# Patient Record
Sex: Male | Born: 1950
Health system: Southern US, Community
[De-identification: ages and names within clinical notes are randomized; demographics above are authoritative.]

## PROBLEM LIST (undated history)

## (undated) DIAGNOSIS — E785 Hyperlipidemia, unspecified: Secondary | ICD-10-CM

## (undated) DIAGNOSIS — F32A Depression, unspecified: Secondary | ICD-10-CM

## (undated) DIAGNOSIS — I2510 Atherosclerotic heart disease of native coronary artery without angina pectoris: Secondary | ICD-10-CM

## (undated) DIAGNOSIS — M542 Cervicalgia: Secondary | ICD-10-CM

## (undated) DIAGNOSIS — I10 Essential (primary) hypertension: Secondary | ICD-10-CM

## (undated) DIAGNOSIS — Z8711 Personal history of peptic ulcer disease: Secondary | ICD-10-CM

## (undated) DIAGNOSIS — M48061 Spinal stenosis, lumbar region without neurogenic claudication: Secondary | ICD-10-CM

## (undated) DIAGNOSIS — K219 Gastro-esophageal reflux disease without esophagitis: Secondary | ICD-10-CM

## (undated) DIAGNOSIS — M1990 Unspecified osteoarthritis, unspecified site: Secondary | ICD-10-CM

## (undated) DIAGNOSIS — E119 Type 2 diabetes mellitus without complications: Secondary | ICD-10-CM

## (undated) DIAGNOSIS — F329 Major depressive disorder, single episode, unspecified: Secondary | ICD-10-CM

## (undated) DIAGNOSIS — Z973 Presence of spectacles and contact lenses: Secondary | ICD-10-CM

## (undated) DIAGNOSIS — F17200 Nicotine dependence, unspecified, uncomplicated: Secondary | ICD-10-CM

## (undated) DIAGNOSIS — M6250 Muscle wasting and atrophy, not elsewhere classified, unspecified site: Secondary | ICD-10-CM

## (undated) HISTORY — PX: REPLACEMENT TOTAL KNEE: SUR1224

## (undated) HISTORY — PX: MR MRA DUPLICATE EXAM  INACTIVATE: HXRAD283

## (undated) HISTORY — PX: SPINE SURGERY: SHX786

## (undated) HISTORY — DX: Atherosclerotic heart disease of native coronary artery without angina pectoris: I25.10

## (undated) HISTORY — PX: JOINT REPLACEMENT: SHX530

## (undated) HISTORY — DX: Depression, unspecified: F32.A

## (undated) HISTORY — DX: Major depressive disorder, single episode, unspecified: F32.9

## (undated) HISTORY — DX: Nicotine dependence, unspecified, uncomplicated: F17.200

## (undated) HISTORY — DX: Cervicalgia: M54.2

## (undated) HISTORY — DX: Essential (primary) hypertension: I10

## (undated) HISTORY — PX: MULTIPLE TOOTH EXTRACTIONS: SHX2053

## (undated) HISTORY — DX: Hyperlipidemia, unspecified: E78.5

## (undated) HISTORY — DX: Type 2 diabetes mellitus without complications: E11.9

## (undated) HISTORY — DX: Personal history of peptic ulcer disease: Z87.11

## (undated) HISTORY — DX: Muscle wasting and atrophy, not elsewhere classified, unspecified site: M62.50

---

## 1982-01-30 HISTORY — PX: STOMACH SURGERY: SHX791

## 1987-01-31 HISTORY — PX: HEMORRHOID SURGERY: SHX153

## 1988-01-31 DIAGNOSIS — K219 Gastro-esophageal reflux disease without esophagitis: Secondary | ICD-10-CM

## 1988-01-31 HISTORY — DX: Gastro-esophageal reflux disease without esophagitis: K21.9

## 1988-01-31 HISTORY — PX: OTHER SURGICAL HISTORY: SHX169

## 1990-01-30 DIAGNOSIS — E785 Hyperlipidemia, unspecified: Secondary | ICD-10-CM

## 1990-01-30 HISTORY — DX: Hyperlipidemia, unspecified: E78.5

## 1995-01-31 HISTORY — PX: CERVICAL DISCECTOMY: SHX98

## 1996-09-30 DIAGNOSIS — E119 Type 2 diabetes mellitus without complications: Secondary | ICD-10-CM

## 1996-09-30 HISTORY — DX: Type 2 diabetes mellitus without complications: E11.9

## 1998-01-30 HISTORY — PX: CERVICAL DISCECTOMY: SHX98

## 1998-08-05 ENCOUNTER — Encounter: Admission: RE | Admit: 1998-08-05 | Discharge: 1998-09-08 | Payer: Self-pay | Admitting: Family Medicine

## 1998-10-14 ENCOUNTER — Ambulatory Visit (HOSPITAL_COMMUNITY): Admission: RE | Admit: 1998-10-14 | Discharge: 1998-10-15 | Payer: Self-pay | Admitting: Neurosurgery

## 1998-10-14 ENCOUNTER — Encounter: Payer: Self-pay | Admitting: Neurosurgery

## 1999-01-14 ENCOUNTER — Encounter: Payer: Self-pay | Admitting: Neurosurgery

## 1999-01-14 ENCOUNTER — Ambulatory Visit (HOSPITAL_COMMUNITY): Admission: RE | Admit: 1999-01-14 | Discharge: 1999-01-14 | Payer: Self-pay | Admitting: Neurosurgery

## 1999-07-18 ENCOUNTER — Encounter: Payer: Self-pay | Admitting: Neurosurgery

## 1999-07-18 ENCOUNTER — Encounter: Admission: RE | Admit: 1999-07-18 | Discharge: 1999-07-18 | Payer: Self-pay | Admitting: Neurosurgery

## 2000-04-30 ENCOUNTER — Encounter: Payer: Self-pay | Admitting: Family Medicine

## 2001-04-25 HISTORY — PX: OTHER SURGICAL HISTORY: SHX169

## 2001-06-25 ENCOUNTER — Ambulatory Visit (HOSPITAL_BASED_OUTPATIENT_CLINIC_OR_DEPARTMENT_OTHER): Admission: RE | Admit: 2001-06-25 | Discharge: 2001-06-25 | Payer: Self-pay | Admitting: *Deleted

## 2001-06-25 ENCOUNTER — Encounter (INDEPENDENT_AMBULATORY_CARE_PROVIDER_SITE_OTHER): Payer: Self-pay | Admitting: Specialist

## 2001-08-30 ENCOUNTER — Encounter: Payer: Self-pay | Admitting: Family Medicine

## 2002-02-14 ENCOUNTER — Ambulatory Visit (HOSPITAL_COMMUNITY): Admission: RE | Admit: 2002-02-14 | Discharge: 2002-02-14 | Payer: Self-pay | Admitting: Orthopaedic Surgery

## 2002-04-10 ENCOUNTER — Encounter: Admission: RE | Admit: 2002-04-10 | Discharge: 2002-04-10 | Payer: Self-pay | Admitting: Orthopaedic Surgery

## 2002-04-19 HISTORY — PX: CERVICAL DISC SURGERY: SHX588

## 2002-05-07 ENCOUNTER — Inpatient Hospital Stay (HOSPITAL_COMMUNITY): Admission: RE | Admit: 2002-05-07 | Discharge: 2002-05-08 | Payer: Self-pay | Admitting: Orthopaedic Surgery

## 2003-03-03 ENCOUNTER — Encounter: Payer: Self-pay | Admitting: Family Medicine

## 2003-03-03 LAB — CONVERTED CEMR LAB: PSA: 1.1 ng/mL

## 2003-03-20 ENCOUNTER — Ambulatory Visit (HOSPITAL_COMMUNITY): Admission: RE | Admit: 2003-03-20 | Discharge: 2003-03-21 | Payer: Self-pay | Admitting: Orthopaedic Surgery

## 2003-05-31 DIAGNOSIS — I10 Essential (primary) hypertension: Secondary | ICD-10-CM

## 2003-05-31 HISTORY — DX: Essential (primary) hypertension: I10

## 2003-07-31 ENCOUNTER — Encounter: Payer: Self-pay | Admitting: Family Medicine

## 2003-07-31 LAB — CONVERTED CEMR LAB: Hgb A1c MFr Bld: 7 %

## 2003-12-31 ENCOUNTER — Encounter: Payer: Self-pay | Admitting: Family Medicine

## 2003-12-31 LAB — CONVERTED CEMR LAB: Hgb A1c MFr Bld: 8.4 %

## 2004-01-26 ENCOUNTER — Ambulatory Visit: Payer: Self-pay | Admitting: Family Medicine

## 2004-01-28 ENCOUNTER — Ambulatory Visit: Payer: Self-pay | Admitting: Family Medicine

## 2004-02-29 ENCOUNTER — Ambulatory Visit: Payer: Self-pay | Admitting: Family Medicine

## 2004-04-30 ENCOUNTER — Encounter: Payer: Self-pay | Admitting: Family Medicine

## 2004-05-26 ENCOUNTER — Ambulatory Visit: Payer: Self-pay | Admitting: Family Medicine

## 2004-05-30 ENCOUNTER — Ambulatory Visit: Payer: Self-pay | Admitting: Family Medicine

## 2004-09-26 ENCOUNTER — Ambulatory Visit: Payer: Self-pay | Admitting: Family Medicine

## 2004-09-30 ENCOUNTER — Encounter: Payer: Self-pay | Admitting: Family Medicine

## 2004-10-07 ENCOUNTER — Ambulatory Visit: Payer: Self-pay | Admitting: Family Medicine

## 2004-12-08 ENCOUNTER — Ambulatory Visit: Payer: Self-pay | Admitting: Family Medicine

## 2005-01-26 ENCOUNTER — Ambulatory Visit: Payer: Self-pay | Admitting: Family Medicine

## 2005-01-30 ENCOUNTER — Encounter: Payer: Self-pay | Admitting: Family Medicine

## 2005-01-30 LAB — CONVERTED CEMR LAB
Hgb A1c MFr Bld: 6 %
Microalbumin U total vol: 17.2 mg/L

## 2005-01-31 ENCOUNTER — Ambulatory Visit: Payer: Self-pay | Admitting: Family Medicine

## 2005-02-13 ENCOUNTER — Ambulatory Visit: Payer: Self-pay | Admitting: Family Medicine

## 2005-03-13 ENCOUNTER — Ambulatory Visit: Payer: Self-pay | Admitting: Family Medicine

## 2005-04-25 ENCOUNTER — Ambulatory Visit: Payer: Self-pay | Admitting: Family Medicine

## 2005-05-01 ENCOUNTER — Ambulatory Visit: Payer: Self-pay | Admitting: Family Medicine

## 2005-09-30 ENCOUNTER — Encounter: Payer: Self-pay | Admitting: Family Medicine

## 2005-10-23 ENCOUNTER — Ambulatory Visit: Payer: Self-pay | Admitting: Family Medicine

## 2005-11-27 ENCOUNTER — Ambulatory Visit: Payer: Self-pay | Admitting: Family Medicine

## 2006-03-19 ENCOUNTER — Ambulatory Visit: Payer: Self-pay | Admitting: Family Medicine

## 2006-03-19 LAB — CONVERTED CEMR LAB
Albumin: 4 g/dL (ref 3.5–5.2)
Chloride: 106 meq/L (ref 96–112)
Cholesterol: 142 mg/dL (ref 0–200)
Creatinine,U: 198.9 mg/dL
GFR calc non Af Amer: 83 mL/min
Hgb A1c MFr Bld: 6.9 % — ABNORMAL HIGH (ref 4.6–6.0)
Microalb Creat Ratio: 18.1 mg/g (ref 0.0–30.0)
Microalb, Ur: 3.6 mg/dL — ABNORMAL HIGH (ref 0.0–1.9)
PSA: 2.15 ng/mL (ref 0.10–4.00)
Potassium: 3.8 meq/L (ref 3.5–5.1)
Sodium: 144 meq/L (ref 135–145)
Total CHOL/HDL Ratio: 4.6
VLDL: 42 mg/dL — ABNORMAL HIGH (ref 0–40)

## 2006-03-20 ENCOUNTER — Encounter: Payer: Self-pay | Admitting: Family Medicine

## 2006-03-26 ENCOUNTER — Ambulatory Visit: Payer: Self-pay | Admitting: Family Medicine

## 2006-04-24 ENCOUNTER — Ambulatory Visit: Payer: Self-pay | Admitting: Family Medicine

## 2006-06-22 ENCOUNTER — Encounter: Payer: Self-pay | Admitting: Family Medicine

## 2006-06-22 ENCOUNTER — Ambulatory Visit: Payer: Self-pay | Admitting: Family Medicine

## 2006-06-22 DIAGNOSIS — M7530 Calcific tendinitis of unspecified shoulder: Secondary | ICD-10-CM | POA: Insufficient documentation

## 2006-06-22 DIAGNOSIS — M5090 Cervical disc disorder, unspecified, unspecified cervical region: Secondary | ICD-10-CM | POA: Insufficient documentation

## 2006-06-22 DIAGNOSIS — R972 Elevated prostate specific antigen [PSA]: Secondary | ICD-10-CM | POA: Insufficient documentation

## 2006-06-22 LAB — CONVERTED CEMR LAB: Hgb A1c MFr Bld: 7.3 % — ABNORMAL HIGH (ref 4.6–6.0)

## 2006-06-25 DIAGNOSIS — I10 Essential (primary) hypertension: Secondary | ICD-10-CM | POA: Insufficient documentation

## 2006-06-25 DIAGNOSIS — E1165 Type 2 diabetes mellitus with hyperglycemia: Secondary | ICD-10-CM

## 2006-06-25 DIAGNOSIS — E785 Hyperlipidemia, unspecified: Secondary | ICD-10-CM

## 2006-06-26 ENCOUNTER — Encounter: Payer: Self-pay | Admitting: Family Medicine

## 2006-07-11 ENCOUNTER — Encounter (INDEPENDENT_AMBULATORY_CARE_PROVIDER_SITE_OTHER): Payer: Self-pay | Admitting: *Deleted

## 2006-07-13 ENCOUNTER — Ambulatory Visit: Payer: Self-pay | Admitting: Family Medicine

## 2006-07-13 DIAGNOSIS — G5600 Carpal tunnel syndrome, unspecified upper limb: Secondary | ICD-10-CM | POA: Insufficient documentation

## 2006-07-13 DIAGNOSIS — R519 Headache, unspecified: Secondary | ICD-10-CM | POA: Insufficient documentation

## 2006-07-13 DIAGNOSIS — R51 Headache: Secondary | ICD-10-CM

## 2006-07-31 ENCOUNTER — Encounter (INDEPENDENT_AMBULATORY_CARE_PROVIDER_SITE_OTHER): Payer: Self-pay | Admitting: *Deleted

## 2007-02-06 ENCOUNTER — Ambulatory Visit: Payer: Self-pay | Admitting: Family Medicine

## 2007-02-08 ENCOUNTER — Encounter: Payer: Self-pay | Admitting: Family Medicine

## 2007-02-27 ENCOUNTER — Encounter: Payer: Self-pay | Admitting: Family Medicine

## 2007-06-14 ENCOUNTER — Ambulatory Visit: Payer: Self-pay | Admitting: Family Medicine

## 2007-06-14 LAB — CONVERTED CEMR LAB
Albumin: 3.8 g/dL (ref 3.5–5.2)
Alkaline Phosphatase: 62 units/L (ref 39–117)
BUN: 15 mg/dL (ref 6–23)
Calcium: 9.1 mg/dL (ref 8.4–10.5)
Cholesterol: 113 mg/dL (ref 0–200)
Creatinine,U: 254.9 mg/dL
Eosinophils Absolute: 0.3 10*3/uL (ref 0.0–0.7)
Eosinophils Relative: 3.2 % (ref 0.0–5.0)
GFR calc Af Amer: 81 mL/min
GFR calc non Af Amer: 67 mL/min
HCT: 44.5 % (ref 39.0–52.0)
Hemoglobin: 14.8 g/dL (ref 13.0–17.0)
Hgb A1c MFr Bld: 6.7 % — ABNORMAL HIGH (ref 4.6–6.0)
MCV: 95.2 fL (ref 78.0–100.0)
Microalb Creat Ratio: 6.3 mg/g (ref 0.0–30.0)
Microalb, Ur: 1.6 mg/dL (ref 0.0–1.9)
Monocytes Absolute: 1 10*3/uL (ref 0.1–1.0)
Neutro Abs: 6 10*3/uL (ref 1.4–7.7)
Platelets: 256 10*3/uL (ref 150–400)
Potassium: 4.3 meq/L (ref 3.5–5.1)
RDW: 12.5 % (ref 11.5–14.6)
TSH: 2.16 microintl units/mL (ref 0.35–5.50)
Total Protein: 6.5 g/dL (ref 6.0–8.3)
Triglycerides: 238 mg/dL (ref 0–149)
WBC: 9.3 10*3/uL (ref 4.5–10.5)

## 2007-06-18 ENCOUNTER — Ambulatory Visit: Payer: Self-pay | Admitting: Family Medicine

## 2007-07-01 ENCOUNTER — Ambulatory Visit: Payer: Self-pay | Admitting: Family Medicine

## 2007-07-01 ENCOUNTER — Encounter (INDEPENDENT_AMBULATORY_CARE_PROVIDER_SITE_OTHER): Payer: Self-pay | Admitting: *Deleted

## 2007-07-01 LAB — CONVERTED CEMR LAB: OCCULT 3: NEGATIVE

## 2007-08-27 ENCOUNTER — Ambulatory Visit: Payer: Self-pay | Admitting: Family Medicine

## 2008-01-21 ENCOUNTER — Encounter (INDEPENDENT_AMBULATORY_CARE_PROVIDER_SITE_OTHER): Payer: Self-pay | Admitting: *Deleted

## 2008-01-23 ENCOUNTER — Ambulatory Visit: Payer: Self-pay | Admitting: Family Medicine

## 2008-01-24 LAB — CONVERTED CEMR LAB: Hgb A1c MFr Bld: 7 % — ABNORMAL HIGH (ref 4.6–6.1)

## 2008-01-27 ENCOUNTER — Ambulatory Visit: Payer: Self-pay | Admitting: Family Medicine

## 2008-01-28 ENCOUNTER — Ambulatory Visit: Payer: Self-pay | Admitting: Family Medicine

## 2008-01-30 ENCOUNTER — Ambulatory Visit: Payer: Self-pay | Admitting: Family Medicine

## 2008-02-03 ENCOUNTER — Ambulatory Visit: Payer: Self-pay | Admitting: Family Medicine

## 2008-02-06 ENCOUNTER — Encounter: Payer: Self-pay | Admitting: Family Medicine

## 2008-05-21 ENCOUNTER — Telehealth: Payer: Self-pay | Admitting: Family Medicine

## 2008-05-21 ENCOUNTER — Encounter: Payer: Self-pay | Admitting: Family Medicine

## 2008-07-24 ENCOUNTER — Telehealth: Payer: Self-pay | Admitting: Family Medicine

## 2008-12-03 ENCOUNTER — Ambulatory Visit: Payer: Self-pay | Admitting: Family Medicine

## 2008-12-09 ENCOUNTER — Telehealth: Payer: Self-pay | Admitting: Family Medicine

## 2009-01-25 ENCOUNTER — Ambulatory Visit: Payer: Self-pay | Admitting: Family Medicine

## 2009-01-25 LAB — CONVERTED CEMR LAB
ALT: 24 units/L (ref 0–53)
AST: 21 units/L (ref 0–37)
BUN: 16 mg/dL (ref 6–23)
Basophils Absolute: 0.1 10*3/uL (ref 0.0–0.1)
Bilirubin, Direct: 0 mg/dL (ref 0.0–0.3)
Cholesterol: 245 mg/dL — ABNORMAL HIGH (ref 0–200)
Creatinine, Ser: 1.1 mg/dL (ref 0.4–1.5)
Creatinine,U: 77.3 mg/dL
Eosinophils Relative: 2.2 % (ref 0.0–5.0)
GFR calc non Af Amer: 73.31 mL/min (ref >60)
Glucose, Bld: 218 mg/dL — ABNORMAL HIGH (ref 70–99)
HDL: 24.4 mg/dL — ABNORMAL LOW (ref >39.00)
Hgb A1c MFr Bld: 8.2 % — ABNORMAL HIGH (ref 4.6–6.5)
Lymphs Abs: 2.3 10*3/uL (ref 0.7–4.0)
Microalb Creat Ratio: 28.5 mg/g (ref 0.0–30.0)
Monocytes Absolute: 1 10*3/uL (ref 0.1–1.0)
Monocytes Relative: 10.4 % (ref 3.0–12.0)
Neutrophils Relative %: 62.8 % (ref 43.0–77.0)
PSA: 1.66 ng/mL (ref 0.10–4.00)
Platelets: 190 10*3/uL (ref 150.0–400.0)
RDW: 12.5 % (ref 11.5–14.6)
TSH: 5.49 microintl units/mL (ref 0.35–5.50)
Total Bilirubin: 0.8 mg/dL (ref 0.3–1.2)
Triglycerides: 457 mg/dL — ABNORMAL HIGH (ref 0.0–149.0)
VLDL: 91.4 mg/dL — ABNORMAL HIGH (ref 0.0–40.0)
WBC: 9.6 10*3/uL (ref 4.5–10.5)

## 2009-01-28 ENCOUNTER — Ambulatory Visit: Payer: Self-pay | Admitting: Family Medicine

## 2009-01-28 DIAGNOSIS — E559 Vitamin D deficiency, unspecified: Secondary | ICD-10-CM

## 2009-02-25 ENCOUNTER — Ambulatory Visit: Payer: Self-pay | Admitting: Family Medicine

## 2009-02-25 LAB — CONVERTED CEMR LAB: OCCULT 1: NEGATIVE

## 2009-02-25 LAB — FECAL OCCULT BLOOD, GUAIAC: Fecal Occult Blood: NEGATIVE

## 2009-03-01 ENCOUNTER — Encounter (INDEPENDENT_AMBULATORY_CARE_PROVIDER_SITE_OTHER): Payer: Self-pay | Admitting: *Deleted

## 2009-04-14 ENCOUNTER — Encounter: Payer: Self-pay | Admitting: Family Medicine

## 2009-04-26 ENCOUNTER — Ambulatory Visit: Payer: Self-pay | Admitting: Family Medicine

## 2009-04-26 LAB — CONVERTED CEMR LAB
Cholesterol: 166 mg/dL (ref 0–200)
HDL: 30.3 mg/dL — ABNORMAL LOW (ref >39.00)
Hgb A1c MFr Bld: 8.6 % — ABNORMAL HIGH (ref 4.6–6.5)
Total CHOL/HDL Ratio: 5
VLDL: 62.4 mg/dL — ABNORMAL HIGH (ref 0.0–40.0)

## 2009-04-29 ENCOUNTER — Ambulatory Visit: Payer: Self-pay | Admitting: Family Medicine

## 2009-06-09 ENCOUNTER — Ambulatory Visit: Payer: Self-pay | Admitting: Family Medicine

## 2009-06-09 DIAGNOSIS — M6250 Muscle wasting and atrophy, not elsewhere classified, unspecified site: Secondary | ICD-10-CM | POA: Insufficient documentation

## 2009-07-09 ENCOUNTER — Ambulatory Visit: Payer: Self-pay | Admitting: Family Medicine

## 2009-07-11 LAB — CONVERTED CEMR LAB
BUN: 20 mg/dL (ref 6–23)
CO2: 29 meq/L (ref 19–32)
Chloride: 106 meq/L (ref 96–112)
Glucose, Bld: 65 mg/dL — ABNORMAL LOW (ref 70–99)
Potassium: 4.7 meq/L (ref 3.5–5.1)
Sodium: 144 meq/L (ref 135–145)
Vit D, 25-Hydroxy: 32 ng/mL (ref 30–89)

## 2009-07-14 ENCOUNTER — Ambulatory Visit: Payer: Self-pay | Admitting: Family Medicine

## 2009-08-17 ENCOUNTER — Encounter: Payer: Self-pay | Admitting: Family Medicine

## 2009-09-07 ENCOUNTER — Encounter (INDEPENDENT_AMBULATORY_CARE_PROVIDER_SITE_OTHER): Payer: Self-pay | Admitting: *Deleted

## 2010-03-03 NOTE — Letter (Signed)
Summary: [facility]   Imported By: Lanelle Bal 08/25/2009 09:14:06  _____________________________________________________________________  External Attachment:    Type:   Image     Comment:   External Document  Appended Document: [facility]    Clinical Lists Changes

## 2010-03-03 NOTE — Assessment & Plan Note (Signed)
Summary: NERVE DAMAGE IN ARMS AND BACK/DLO   Vital Signs:  Patient profile:   60 year old male Weight:      206.75 pounds Temp:     97.8 degrees F oral Pulse rate:   80 / minute Pulse rhythm:   regular BP sitting:   138 / 70  (left arm) Cuff size:   large  Vitals Entered By: Sydell Axon LPN (Jun 09, 2009 11:33 AM) CC: Spasms in hands, arms and shoulder blades, wants a refill on Flexeril and something for depression   History of Present Illness: At time of his last appt, he had gone to [place] to see his sister and had forgotten about his appt here. He won't do that again.  He has muscle cramps this week causing his fingers to go in weird directions. He denies unusual activity, repetitive motions or excessive sweating causing dehydration. He has also had pain down the upper arms into his neck that he thinks is from his neck and previous problems requiring surgical trmt. His   past surgery was done by Dr Ophelia Charter and he would like to  go back to see him. He has muscle wasting in his tricep area bilat but has had that before when he had had the neck surgery and is therefore pretty sure it is probably from that as opposed to a neurological problem.Marland Kitchen  His sugar has been 130s-150s, typically checking first thing in the day and at night. He  seems to be doing ok with sugar.  Problems Prior to Update: 1)  Vitamin D Deficiency  (ICD-268.9) 2)  Special Screening Malig Neoplasms Other Sites  (ICD-V76.49) 3)  Headache  (ICD-784.0) 4)  Carpal Tunnel Syndrome, Bilateral  (ICD-354.0) 5)  Melanoma, Malignant, Trunk  (ICD-172.5) 6)  Tendinitis, Calcific, Shoulder, Left  (ICD-726.11) 7)  Cervical Disc Disorder  (ICD-722.91) 8)  Hypertension  (ICD-401.9) 9)  Hyperlipidemia  (ICD-272.4) 10)  Elevated Prostate Specific Antigen  (ICD-790.93) 11)  Diabetes Mellitus, Type II  (ICD-250.00)  Medications Prior to Update: 1)  Lisinopril 20 Mg Tabs (Lisinopril) .... Take 1 Tablet By Mouth Daily 2)  Lantus 100  Unit/ml Soln (Insulin Glargine) .... Inject 70 Unit Subcutaneously Once A Day //send 6 Vials 10ml Each 3)  Niaspan 500 Mg Tbcr (Niacin (Antihyperlipidemic)) .... Take 1 Tablet By Mouth Every Night 4)  Actos 45 Mg Tabs (Pioglitazone Hcl) .... Take One By Mouth Daily 5)  Pravastatin Sodium 80 Mg Tabs (Pravastatin Sodium) .... One Tab By Mouth At Night 6)  Glipizide 2.5 Mg Tb24 (Glipizide) .... Take 1 Tablet By Mouth Once A Day 7)  Celebrex 200 Mg Caps (Celecoxib) .... Take One By Mouth Daily 8)  Fish Oil  Caps (Omega-3 Fatty Acids Caps) .... Take One By Mouth Daily 9)  Cyclobenzaprine Hcl 10 Mg  Tabs (Cyclobenzaprine Hcl) .... Take 1/2 Tablet Twice Daily and 1 Tablet At Bedtime As Needed 10)  Metformin Hcl 1000 Mg  Tabs (Metformin Hcl) .Marland Kitchen.. 1 Tablet Two Times A Day 11)  Bd Insulin Syringe Half-Unit 31g X 5/16" 0.3 Ml  Misc (Insulin Syringe-Needle U-100) .... Ufii Short 1 Cc Synringes Use To Inject 60 Units Daily Subcu 12)  Viagra 100 Mg Tabs (Sildenafil Citrate) .... One Tab By Mouth 1 Hour Prior To Dsiered Intercourse. 13)  Bd Insulin Syringe Ultrafine 29g X 1/2" 1 Ml Misc (Insulin Syringe-Needle U-100) 14)  Ergocalciferol 50000 Unit Caps (Ergocalciferol) .... One Tab By Mouth Weekly  Allergies: No Known Drug Allergies  Physical  Exam  General:  Well-developed,well-nourished,in no acute distress; alert,appropriate and cooperative throughout examination, congested. Head:  Normocephalic and atraumatic without obvious abnormalities. No apparent alopecia or balding. Sinuses NT. Eyes:  Conjunctiva clear bilaterally.  Ears:  External ear exam shows no significant lesions or deformities.  Otoscopic examination reveals clear canals, tympanic membranes are intact bilaterally without bulging, retraction, inflammation or discharge. Hearing is grossly normal bilaterally. Nose:  External nasal examination shows no deformity or inflammation. Nasal mucosa are pink and moist without lesions or  exudates. Mouth:  Oral mucosa and oropharynx without lesions or exudates.  Teeth in good repair. Neck:  No deformities, masses, or tenderness noted. Chest Wall:  No deformities, masses, tenderness or gynecomastia noted. Lungs:  Normal respiratory effort, chest expands symmetrically. Lungs are clear to auscultation, no crackles or wheezes. Heart:  Normal rate and regular rhythm. S1 and S2 normal without gallop, murmur, click, rub or other extra sounds. Abdomen:  Bowel sounds positive,abdomen soft and non-tender without masses, organomegaly or hernias noted. Mildly protuberant. Msk:  No deformity or scoliosis noted of thoracic or lumbar spine.   Extremities:  No clubbing, cyanosis, edema noted. Mild atrophy of tricep area. ROM reasonable. Sensation appears intact. Neurologic:  No cranial nerve deficits noted. Station and gait are normal. Sensory, motor and coordinative functions appear intact.   Impression & Recommendations:  Problem # 1:  MUSCULAR WASTING AND DISUSE ATROPHY NEC (ICD-728.2) Assessment New Will refer back to Dr Ophelia Charter. Orders: Orthopedic Referral (Ortho)  Problem # 2:  VITAMIN D DEFICIENCY (ICD-268.9) Assessment: Improved Has been on weekly for quite some time, Vit D lvl in Mar almost therapeutic, which I'm sure he is now. Start on OTC Vit D 1000 Iu two times a day.  Problem # 3:  HYPERTENSION (ICD-401.9) Assessment: Unchanged Stable. His updated medication list for this problem includes:    Lisinopril 20 Mg Tabs (Lisinopril) .Marland Kitchen... Take 1 tablet by mouth daily  BP today: 138/70 Prior BP: 130/84 (01/28/2009)  Labs Reviewed: K+: 4.3 (01/25/2009) Creat: : 1.1 (01/25/2009)   Chol: 166 (04/26/2009)   HDL: 30.30 (04/26/2009)   LDL: DEL (06/14/2007)   TG: 312.0 (04/26/2009)  Problem # 4:  DIABETES MELLITUS, TYPE II (ICD-250.00) Assessment: Improved Nos seem improved. Will recheck A1C prior to next visit. His updated medication list for this problem includes:     Lisinopril 20 Mg Tabs (Lisinopril) .Marland Kitchen... Take 1 tablet by mouth daily    Lantus 100 Unit/ml Soln (Insulin glargine) ..... Inject 70 unit subcutaneously once a day //send 6 vials 10ml each    Actos 45 Mg Tabs (Pioglitazone hcl) .Marland Kitchen... Take one by mouth daily    Glipizide 2.5 Mg Tb24 (Glipizide) .Marland Kitchen... Take 1 tablet by mouth once a day    Metformin Hcl 1000 Mg Tabs (Metformin hcl) .Marland Kitchen... 1 tablet two times a day  Labs Reviewed: Creat: 1.1 (01/25/2009)   Microalbumin: 18.1 (03/20/2006)  Last Eye Exam: normal (04/14/2009) Reviewed HgBA1c results: 8.6 (04/26/2009)  8.2 (01/25/2009)  Problem # 5:  MUSCLE CRAMPS, HANDS (ICD-729.82) Assessment: New Try jogging in a jug.  Complete Medication List: 1)  Lisinopril 20 Mg Tabs (Lisinopril) .... Take 1 tablet by mouth daily 2)  Lantus 100 Unit/ml Soln (Insulin glargine) .... Inject 70 unit subcutaneously once a day //send 6 vials 10ml each 3)  Niaspan 500 Mg Tbcr (Niacin (antihyperlipidemic)) .... Take 1 tablet by mouth every night 4)  Actos 45 Mg Tabs (Pioglitazone hcl) .... Take one by mouth daily 5)  Pravastatin Sodium 80  Mg Tabs (Pravastatin sodium) .... One tab by mouth at night 6)  Glipizide 2.5 Mg Tb24 (Glipizide) .... Take 1 tablet by mouth once a day 7)  Celebrex 200 Mg Caps (Celecoxib) .... Take one by mouth daily 8)  Fish Oil Caps (Omega-3 fatty acids caps) .... Take one by mouth daily 9)  Cyclobenzaprine Hcl 10 Mg Tabs (Cyclobenzaprine hcl) .... Take 1/2 tablet twice daily and 1 tablet at bedtime as needed 10)  Metformin Hcl 1000 Mg Tabs (Metformin hcl) .Marland Kitchen.. 1 tablet two times a day 11)  Bd Insulin Syringe Half-unit 31g X 5/16" 0.3 Ml Misc (Insulin syringe-needle u-100) .... Ufii short 1 cc synringes use to inject 60 units daily subcu 12)  Viagra 100 Mg Tabs (Sildenafil citrate) .... One tab by mouth 1 hour prior to dsiered intercourse. 13)  Bd Insulin Syringe Ultrafine 29g X 1/2" 1 Ml Misc (Insulin syringe-needle u-100) 14)  Ergocalciferol  50000 Unit Caps (Ergocalciferol) .... One tab by mouth weekly 15)  Zoloft 50 Mg Tabs (Sertraline hcl) .... One tab by mouth daily.  Patient Instructions: 1)  RTC 1 month for sugar recheck, labs prior. Chack A1C 250.00, Vit D 268.9, Bmet, Mg, TSH 729.82. 2)  Refer to Dr Ophelia Charter. 3)  Google "jogging in a jug."  Prescriptions: CYCLOBENZAPRINE HCL 10 MG  TABS (CYCLOBENZAPRINE HCL) TAKE 1/2 TABLET TWICE DAILY AND 1 TABLET AT BEDTIME AS NEEDED  #60 x 0   Entered and Authorized by:   Shaune Leeks MD   Signed by:   Shaune Leeks MD on 06/09/2009   Method used:   Print then Give to Patient   RxID:   [id number] CYCLOBENZAPRINE HCL 10 MG  TABS (CYCLOBENZAPRINE HCL) TAKE 1/2 TABLET TWICE DAILY AND 1 TABLET AT BEDTIME AS NEEDED  #180 x 3   Entered and Authorized by:   Shaune Leeks MD   Signed by:   Shaune Leeks MD on 06/09/2009   Method used:   Print then Give to Patient   RxID:   [id number] ZOLOFT 50 MG TABS (SERTRALINE HCL) one tab by mouth daily.  #30 x 0   Entered and Authorized by:   Shaune Leeks MD   Signed by:   Shaune Leeks MD on 06/09/2009   Method used:   Print then Give to Patient   RxID:   [id number] ZOLOFT 50 MG TABS (SERTRALINE HCL) one tab by mouth daily.  #90 x 3   Entered and Authorized by:   Shaune Leeks MD   Signed by:   Shaune Leeks MD on 06/09/2009   Method used:   Print then Give to Patient   RxID:   [id number]   Current Allergies (reviewed today): No known allergies

## 2010-03-03 NOTE — Letter (Signed)
Summary: [facility]  [facility]   Imported By: Beau Fanny 04/15/2009 10:06:10  _____________________________________________________________________  External Attachment:    Type:   Image     Comment:   External Document  Appended Document: [facility]    Clinical Lists Changes  Observations: Added new observation of DMEYEEXAMNXT: 05/2010 (04/15/2009 18:01) Added new observation of DMEYEEXMRES: normal (04/14/2009 18:02) Added new observation of EYE EXAM BY: Dr Lorin Picket (04/14/2009 18:02) Added new observation of DIAB EYE EX: normal (04/14/2009 18:02)        Diabetes Management Exam:    Eye Exam:       Eye Exam done elsewhere          Date: 04/14/2009          Results: normal          Done by: Dr Lorin Picket

## 2010-03-03 NOTE — Letter (Signed)
Summary: [name] letter  [place] at [facility]  [address] [place], [place] [address]   Phone: [phone]  Fax: [phone]       09/07/2009 MRN: [id number]  Anthony Cuevas [address] [place], [place]  [address]  Dear [name],  [facility] [place] and [place] announce the retirement of Arta Silence, M.D., from full-time practice at the [facility] office effective July 29, 2009 and his plans of returning part-time.  It is important to Dr. Hetty Ely and to our practice that you understand that [facility] has seven physicians in our office for your health care needs.  We will continue to offer the same exceptional care that you have today.    Dr. Hetty Ely has spoken to many of you about his plans for retirement and returning part-time in the fall.   We will continue to work with you through the transition to schedule appointments for you in the office and meet the high standards that [facility] is committed to.   Again, it is with great pleasure that we share the news that Dr. Hetty Ely will return to [facility] at [facility] in October of 2011 with a reduced schedule.    If you have any questions, or would like to request an appointment with one of our physicians, please call us at [phone] and press the option for Scheduling an appointment.  We take pleasure in providing you with excellent patient care and look forward to seeing you at your next office visit.  Our [facility] Physicians are:  Tillman Abide, M.D. Laurita Quint, M.D. Roxy Manns, M.D. Kerby Nora, M.D. Hannah Beat, M.D. Ruthe Mannan, M.D. We proudly welcomed Raechel Ache, M.D. and Eustaquio Boyden, M.D. to the practice in July/August 2011.  Sincerely,  [facility]

## 2010-03-03 NOTE — Letter (Signed)
Summary: Results Follow up Letter  [place] [facility]  [address] [place], [place] [address]   Phone: [phone]  Fax: [phone]    03/01/2009 MRN: [id number]  Anthony Cuevas [address] [place], [place]  [address]  Dear Mr. Mcclaine,  The following are the results of your recent test(s):  Test         Result    Pap Smear:        Normal _____  Not Normal _____ Comments: ______________________________________________________ Cholesterol: LDL(Bad cholesterol):         Your goal is less than:         HDL (Good cholesterol):       Your goal is more than: Comments:  ______________________________________________________ Mammogram:        Normal _____  Not Normal _____ Comments:  ___________________________________________________________________ Hemoccult:        Normal __X___  Not normal _______ Comments:  Please repeat in one year.  _____________________________________________________________________ Other Tests:    We routinely do not discuss normal results over the telephone.  If you desire a copy of the results, or you have any questions about this information we can discuss them at your next office visit.   Sincerely,     Laurita Quint, MD

## 2010-05-27 ENCOUNTER — Other Ambulatory Visit: Payer: Self-pay | Admitting: Family Medicine

## 2010-05-27 DIAGNOSIS — I10 Essential (primary) hypertension: Secondary | ICD-10-CM

## 2010-05-27 DIAGNOSIS — R972 Elevated prostate specific antigen [PSA]: Secondary | ICD-10-CM

## 2010-05-27 DIAGNOSIS — Z125 Encounter for screening for malignant neoplasm of prostate: Secondary | ICD-10-CM

## 2010-05-27 DIAGNOSIS — E7800 Pure hypercholesterolemia, unspecified: Secondary | ICD-10-CM

## 2010-05-27 DIAGNOSIS — E559 Vitamin D deficiency, unspecified: Secondary | ICD-10-CM

## 2010-05-28 ENCOUNTER — Encounter: Payer: Self-pay | Admitting: Family Medicine

## 2010-05-30 ENCOUNTER — Other Ambulatory Visit (INDEPENDENT_AMBULATORY_CARE_PROVIDER_SITE_OTHER): Payer: Medicare Other

## 2010-05-30 DIAGNOSIS — R972 Elevated prostate specific antigen [PSA]: Secondary | ICD-10-CM

## 2010-05-30 DIAGNOSIS — E119 Type 2 diabetes mellitus without complications: Secondary | ICD-10-CM

## 2010-05-30 DIAGNOSIS — Z125 Encounter for screening for malignant neoplasm of prostate: Secondary | ICD-10-CM

## 2010-05-30 DIAGNOSIS — E7800 Pure hypercholesterolemia, unspecified: Secondary | ICD-10-CM

## 2010-05-30 DIAGNOSIS — E559 Vitamin D deficiency, unspecified: Secondary | ICD-10-CM

## 2010-05-30 DIAGNOSIS — I10 Essential (primary) hypertension: Secondary | ICD-10-CM

## 2010-05-30 LAB — CBC WITH DIFFERENTIAL/PLATELET
Eosinophils Absolute: 0.2 10*3/uL (ref 0.0–0.7)
Eosinophils Relative: 2.1 % (ref 0.0–5.0)
HCT: 42.6 % (ref 39.0–52.0)
Lymphs Abs: 1.9 10*3/uL (ref 0.7–4.0)
MCHC: 34.8 g/dL (ref 30.0–36.0)
MCV: 92.5 fl (ref 78.0–100.0)
Monocytes Absolute: 1.3 10*3/uL — ABNORMAL HIGH (ref 0.1–1.0)
Platelets: 243 10*3/uL (ref 150.0–400.0)
RDW: 13.7 % (ref 11.5–14.6)
WBC: 11.1 10*3/uL — ABNORMAL HIGH (ref 4.5–10.5)

## 2010-05-30 LAB — LIPID PANEL
Cholesterol: 190 mg/dL (ref 0–200)
HDL: 30.4 mg/dL — ABNORMAL LOW (ref >39.00)
Total CHOL/HDL Ratio: 6
VLDL: 41.2 mg/dL — ABNORMAL HIGH (ref 0.0–40.0)

## 2010-05-30 LAB — BASIC METABOLIC PANEL
Calcium: 9.5 mg/dL (ref 8.4–10.5)
GFR: 91.98 mL/min (ref >60.00)
Glucose, Bld: 169 mg/dL — ABNORMAL HIGH (ref 70–99)
Sodium: 139 mEq/L (ref 135–145)

## 2010-05-30 LAB — MICROALBUMIN / CREATININE URINE RATIO
Creatinine,U: 165.7 mg/dL
Microalb Creat Ratio: 3.4 mg/g (ref 0.0–30.0)
Microalb, Ur: 5.7 mg/dL — ABNORMAL HIGH (ref 0.0–1.9)

## 2010-05-30 LAB — HEMOGLOBIN A1C: Hgb A1c MFr Bld: 9.7 % — ABNORMAL HIGH (ref 4.6–6.5)

## 2010-05-30 LAB — PSA: PSA: 1.96 ng/mL (ref 0.10–4.00)

## 2010-05-30 LAB — HEPATIC FUNCTION PANEL
Albumin: 3.9 g/dL (ref 3.5–5.2)
Alkaline Phosphatase: 80 U/L (ref 39–117)
Total Bilirubin: 0.3 mg/dL (ref 0.3–1.2)

## 2010-05-31 LAB — VITAMIN D 25 HYDROXY (VIT D DEFICIENCY, FRACTURES): Vit D, 25-Hydroxy: 29 ng/mL — ABNORMAL LOW (ref 30–89)

## 2010-06-01 ENCOUNTER — Ambulatory Visit (INDEPENDENT_AMBULATORY_CARE_PROVIDER_SITE_OTHER): Payer: Medicare Other | Admitting: Family Medicine

## 2010-06-01 ENCOUNTER — Encounter: Payer: Self-pay | Admitting: Family Medicine

## 2010-06-01 VITALS — BP 108/60 | HR 88 | Temp 98.1°F | Ht 68.5 in | Wt 191.8 lb

## 2010-06-01 DIAGNOSIS — E119 Type 2 diabetes mellitus without complications: Secondary | ICD-10-CM

## 2010-06-01 DIAGNOSIS — E559 Vitamin D deficiency, unspecified: Secondary | ICD-10-CM

## 2010-06-01 DIAGNOSIS — I10 Essential (primary) hypertension: Secondary | ICD-10-CM

## 2010-06-01 DIAGNOSIS — Z0000 Encounter for general adult medical examination without abnormal findings: Secondary | ICD-10-CM

## 2010-06-01 DIAGNOSIS — N529 Male erectile dysfunction, unspecified: Secondary | ICD-10-CM | POA: Insufficient documentation

## 2010-06-01 DIAGNOSIS — M5090 Cervical disc disorder, unspecified, unspecified cervical region: Secondary | ICD-10-CM

## 2010-06-01 DIAGNOSIS — R972 Elevated prostate specific antigen [PSA]: Secondary | ICD-10-CM

## 2010-06-01 DIAGNOSIS — E785 Hyperlipidemia, unspecified: Secondary | ICD-10-CM

## 2010-06-01 MED ORDER — NIACIN ER (ANTIHYPERLIPIDEMIC) 500 MG PO TBCR: 500.0000 mg | EXTENDED_RELEASE_TABLET | Freq: Every day | ORAL | Status: DC

## 2010-06-01 MED ORDER — GLIPIZIDE ER 2.5 MG PO TB24: 2.5000 mg | ORAL_TABLET | Freq: Every day | ORAL | Status: DC

## 2010-06-01 MED ORDER — SERTRALINE HCL 50 MG PO TABS: 50.0000 mg | ORAL_TABLET | Freq: Every day | ORAL | Status: DC

## 2010-06-01 MED ORDER — PRAVASTATIN SODIUM 80 MG PO TABS: 80.0000 mg | ORAL_TABLET | Freq: Every day | ORAL | Status: DC

## 2010-06-01 MED ORDER — LISINOPRIL 20 MG PO TABS: 20.0000 mg | ORAL_TABLET | Freq: Every day | ORAL | Status: DC

## 2010-06-01 MED ORDER — CYCLOBENZAPRINE HCL 10 MG PO TABS: 10.0000 mg | ORAL_TABLET | Freq: Three times a day (TID) | ORAL | Status: DC | PRN

## 2010-06-01 MED ORDER — OMEGA-3 FATTY ACIDS 1000 MG PO CAPS: 1.0000 | ORAL_CAPSULE | Freq: Every day | ORAL | Status: DC

## 2010-06-01 MED ORDER — INSULIN GLARGINE 100 UNIT/ML ~~LOC~~ SOLN: SUBCUTANEOUS | Status: DC

## 2010-06-01 MED ORDER — PIOGLITAZONE HCL 45 MG PO TABS: 45.0000 mg | ORAL_TABLET | Freq: Every day | ORAL | Status: DC

## 2010-06-01 MED ORDER — CELECOXIB 200 MG PO CAPS: 200.0000 mg | ORAL_CAPSULE | Freq: Every day | ORAL | Status: DC

## 2010-06-01 MED ORDER — "INSULIN SYRINGE-NEEDLE U-100 31G X 5/16"" 0.3 ML MISC": Status: DC

## 2010-06-01 MED ORDER — METFORMIN HCL 1000 MG PO TABS: 1000.0000 mg | ORAL_TABLET | Freq: Two times a day (BID) | ORAL | Status: DC

## 2010-06-01 NOTE — Progress Notes (Signed)
  Subjective:    Patient ID: Anthony Cuevas, male    DOB: 04/30/1950, 60 y.o.   MRN: [id number]  HPI Pt here for Comp Exam, laqst one 11/2 years ago. He had seen Dr Ophelia Charter for his knee and while there he had xrays of his neck and back. He was tolfd that was nml to have musc spasms and continued to use cyclobenzaprine, taking one in AM and 1/2 at night.  He has been taking Vit D 50000IU weekly. Otherwise he was sick yesterday with N&V, better today. He threw up last night and had light brfst this AM. He is urinating often, had nocturia regularly.    Review of Systems     Objective:   Physical Exam        Assessment & Plan:

## 2010-06-01 NOTE — Patient Instructions (Addendum)
Refer to Dr Patsi Sears for ED, poss test def. RTC 6 mos recheck, A1C prior. Try adding Co Q 10 to medical regimen. Have a banana a day. Take Vit D 2000Iu twice a day.

## 2010-06-04 NOTE — Assessment & Plan Note (Signed)
Adequate with curr meds and diet. Cont as is.

## 2010-06-04 NOTE — Progress Notes (Signed)
  Subjective:    Patient ID: Anthony Cuevas, male    DOB: [birth year], 60 y.o.   MRN: [id number]  HPI    Review of Systems  Constitutional: Negative for fever, chills, diaphoresis, appetite change, fatigue and unexpected weight change.  HENT: Negative for hearing loss, ear pain, tinnitus and ear discharge.   Eyes: Negative for pain, discharge and visual disturbance.  Respiratory: Negative for cough, shortness of breath and wheezing.   Cardiovascular: Negative for chest pain and palpitations.       No SOB w/ exertion  Gastrointestinal: Negative for nausea, vomiting, abdominal pain, diarrhea, constipation and blood in stool.       No heartburn or swallowing problems.  Genitourinary: Negative for dysuria, frequency and difficulty urinating.       No nocturia  Musculoskeletal: Positive for back pain and arthralgias. Negative for myalgias.       Reasonably frequent upper back/neck discomfort that Dr Ophelia Charter has previously told him is not unusual and to use muscle relaxers.  Skin: Negative for rash.       No itching or dryness.  Neurological: Negative for tremors and numbness.       No tingling or balance problems.  Hematological: Negative for adenopathy. Does not bruise/bleed easily.  Psychiatric/Behavioral: Negative for dysphoric mood and agitation.       Objective:   Physical Exam  Constitutional: He is oriented to person, place, and time. He appears well-developed and well-nourished. No distress.  HENT:  Head: Normocephalic and atraumatic.  Right Ear: External ear normal.  Left Ear: External ear normal.  Nose: Nose normal.  Mouth/Throat: Oropharynx is clear and moist.  Eyes: Conjunctivae and EOM are normal. Pupils are equal, round, and reactive to light. Right eye exhibits no discharge. Left eye exhibits no discharge. No scleral icterus.  Neck: Normal range of motion. Neck supple. No thyromegaly present.  Cardiovascular: Normal rate, regular rhythm, normal heart sounds and intact  distal pulses.   No murmur heard. Pulmonary/Chest: Effort normal and breath sounds normal. No respiratory distress. He has no wheezes.  Abdominal: Soft. Bowel sounds are normal. He exhibits no distension and no mass. There is no tenderness. There is no rebound and no guarding.  Genitourinary: Rectum normal, prostate normal and penis normal. Guaiac negative stool.       Prostate 20gms.  Musculoskeletal: Normal range of motion. He exhibits no edema.  Lymphadenopathy:    He has no cervical adenopathy.  Neurological: He is alert and oriented to person, place, and time. Coordination normal.  Skin: Skin is warm and dry. No rash noted. He is not diaphoretic.  Psychiatric: He has a normal mood and affect. His behavior is normal. Judgment and thought content normal.          Assessment & Plan:  Health Maintenance Exam  I have personally reviewed the Medicare Annual Wellness questionnaire and have noted 1. The patient's medical and social history 2. Their use of alcohol, tobacco or illicit drugs 3. Their current medications and supplements 4. The patient's functional ability including ADL's, fall risks, home safety risks and hearing or visual             impairment. 5. Diet and physical activities 6. Evidence for depression or mood disorders

## 2010-06-04 NOTE — Assessment & Plan Note (Signed)
Has seen Dr Ophelia Charter in the past. Sxs continue but are treatable and respond. Suggest he avoid Celebrex as much as possible. Also suggest Co Q 10 and potassium supplementation.

## 2010-06-04 NOTE — Assessment & Plan Note (Addendum)
Great control. Cont curr meds. BP Readings from Last 3 Encounters:  06/01/10 108/60  06/09/09 138/70  01/28/09 130/84

## 2010-06-04 NOTE — Assessment & Plan Note (Signed)
Therapeutic at curr meds but will try going OTC with 2000Iu bid and recheck in the future.

## 2010-06-04 NOTE — Assessment & Plan Note (Signed)
Adequate control. Cont curr meds.  Actos still well tolerated. Will cont. Discussed again diet and exercise.

## 2010-06-04 NOTE — Assessment & Plan Note (Signed)
Will refer to Urology for eval..the patient also probably has testosterone deficiency.

## 2010-06-04 NOTE — Assessment & Plan Note (Signed)
Stable at this time. Will cont to follow.

## 2010-06-17 ENCOUNTER — Other Ambulatory Visit: Payer: Self-pay | Admitting: *Deleted

## 2010-06-17 MED ORDER — CYCLOBENZAPRINE HCL 10 MG PO TABS: 10.0000 mg | ORAL_TABLET | Freq: Three times a day (TID) | ORAL | Status: DC | PRN

## 2010-06-17 NOTE — Op Note (Signed)
[facility]  Patient:    Anthony Cuevas, Anthony Cuevas Visit Number: [id number] MRN: [id number]          Service Type: DSU Location: [facility] Attending Physician:  Kandis Mannan Dictated by:   Donnie Coffin Samuella Cota, M.D. Proc. Date: 06/25/01 Admit Date:  06/25/2001   CC:         Dr. Dorinda Hill  Dr. Laurita Quint   Operative Report  CCS# [phone]  PREOPERATIVE DIAGNOSIS:  Squamous cell carcinoma, right posterior neck.  POSTOPERATIVE DIAGNOSIS:  Squamous cell carcinoma, right posterior neck.  OPERATION PERFORMED:  Excision of squamous cell carcinoma, right posterior neck.  SURGEON:  Maisie Fus B. Samuella Cota, M.D.  ANESTHESIA:  General.  ANESTHESIOLOGIST:  Dr. Gypsy Balsam and CRNA.  DESCRIPTION OF PROCEDURE:  The patient was taken to the operating room where he was placed on a stretcher and given satisfactory general anesthetic.  He was then placed in a prone position.  The posterior neck was prepped and draped as a sterile field.  The patient had had a lesion measuring 2 x 3 cm. An elliptical incision was outlined so as to remove the entire lesion with good margins.  The skin incision was taken down to the muscle and the specimen was controlled with the cautery.  The defect after removal of the tumor was 5 x 3.5 cm.  This was a transverse incision.  The subcutaneous tissues were closed with interrupted sutures of 3-0 Vicryl and the skin was closed with interrupted vertical mattress sutures of 4-0 nylon.  The wound seemed to come together nicely without undue tension.  A dry sterile dressing using 4 x 4s and HypaFix was applied.  The patient seemed to tolerate the procedure well and was taken to the PACU in satisfactory condition. Dictated by:   Donnie Coffin Samuella Cota, M.D. Attending Physician:  Kandis Mannan DD:  06/25/01 TD:  06/26/01 Job: [id number]

## 2010-06-17 NOTE — Op Note (Signed)
NAME:  Anthony Cuevas, Anthony Cuevas                        ACCOUNT NO.:  [id number]   MEDICAL RECORD NO.:  [id number]                   PATIENT TYPE:  OIB   LOCATION:  2896                                 FACILITY:  [facility]   PHYSICIAN:  Mark C. Ophelia Charter, M.D.                 DATE OF BIRTH:  [birth year]   DATE OF PROCEDURE:  DATE OF DISCHARGE:  02/14/2002                                 OPERATIVE REPORT   PREOPERATIVE DIAGNOSIS:  Left knee medial compartment osteoarthritis.   POSTOPERATIVE DIAGNOSIS:  Left knee medial compartment osteoarthritis.   PROCEDURE:  Left knee partial medial patellectomy and medial  unicompartmental arthroplasty.   SURGEON:  Mark C. Ophelia Charter, M.D.   ASSISTANT:  Genene Churn. Denton Meek.   ANESTHESIA:  General plus preoperative femoral nerve block and Marcaine skin  local.   TOURNIQUET TIME:  1 hour and 6 minutes.   DESCRIPTION OF PROCEDURE:  After induction of general anesthesia with  orotracheal intubation and application of a mid thigh tourniquet with an  orange leg holder with a well leg holder for the right leg, the foot of the  table was flexed down maximally and the patient positioned so that the knee  would flex past 90 degrees over the end of the table.   Standard prepping and draping was performed using the total knee pack with  impervious stockinette and Coban, a sterile skin marker and Betadine applied  drape.  A three and a half inch incision was made straight medial  parapatellar.  Degenerative meniscus was resected and an oscillating saw was  used to perform a partial medial patellar resection.  Spurs were trimmed off  the femoral condyle.  There was a sclerotic area of bone with grade 4  chondromalacia at the medial femoral condyle on the weight bearing surface.  A tide mark was marked with methylene blue.  Using the external alignment  guide, a tibial resection was made.  An 8 mm spacer was slightly tight and  an additional 2 mm was resected.  Resected bone  fit a medial spacer.  The  femoral side was small and the external alignment guides with the femoral  head location used for appropriate positioning.  The femoral component was  biased slightly toward the medial aspect. A burr was used followed by a  small center drill, larger drill.  The trial femoral was sized and the trial  had to be removed for further burring of the groove to further inset the  femoral component so that it was flush with the condyle and was 2 mm more  prominent than the patient's own bone toward the medial femoral condyle.  An  8 mm spacer would allow a tongue depressor to be inserted and could slide in  and out with slight catching with good tensioning in both flexion and  extension.  Pulsatile lavage was used and three sponges of [employer] were  packed.  The cement was vacuum mixed and the tibial component was inserted  first.  Excessive cement was removed.  The Kiel holes had been made and the  permanent prosthesis fit flush with the medial tibial plateau.  The femoral  component was cemented into place.  The excessive cement was removed and  after 15 minutes with the tongue blade in position the cement was hard.  There was excellent femoral contact posteriorly on the posterior condyle.  All meniscus had been resected and the tourniquet was deflated.  Small  bleeders were controlled.  The deep retinaculum was closed with 0 Vicryl and  2-0 Vicryl to the subcutaneous tissue, skin closure, postoperative dressing  and knee immobilizer.  A pain pump was inserted prior to closure and hooked  up for 48 hours of Marcaine 1/2% plain.  The patient tolerated the procedure  well, was transferred to the recovery room and then discharged home later in  the day with office follow-up in one week.  Instrument count and needle  count were correct.                                               Mark C. Ophelia Charter, M.D.    MCY/MEDQ  D:  02/14/2002  T:  02/15/2002  Job:  [id number]

## 2010-06-17 NOTE — Op Note (Signed)
NAME:  Anthony Cuevas, Anthony Cuevas                        ACCOUNT NO.:  [id number]   MEDICAL RECORD NO.:  [id number]                   PATIENT TYPE:  OIB   LOCATION:  2550                                 FACILITY:  [facility]   PHYSICIAN:  Mark C. Ophelia Charter, M.D.                 DATE OF BIRTH:  02-09-1950   DATE OF PROCEDURE:  03/20/2003  DATE OF DISCHARGE:                                 OPERATIVE REPORT   PREOPERATIVE DIAGNOSIS:  Broken anterior cervical plate status post  posterior fusion for pseudoarthrosis.   POSTOPERATIVE DIAGNOSIS:  Broken anterior cervical plate status post  posterior fusion for pseudoarthrosis.   PROCEDURE:  Removal of anterior plate and distal screws and anterior  osteophyte.   BRIEF HISTORY:  This 60 year old male had anterior cervical fusion done by  Dr. Rubye Beach years ago.  He has had persistent pain in his neck and flexion  extension x-ray showed that he had motion at the fusion site with broken  proximal screws and some settling with plate impingement, and a large  osteophyte with impingement against superior vertebra.  He underwent a  posterior fusion with iliac crest bone graft and wiring and now has a solid  fusion with no motion and was brought in for removal of the retained  anterior plate.   PROCEDURE:  After preoperative Ancef prophylaxis, the patient was intubated.  A sandbag was placed behind the neck.  The neck was prepped with DuraPrep,  the area was spread with towels, Betadine and Vi-Drape were applied, and  thyroid sheet.  A transverse incision had been marked with a sterile skin  marker and the old incision was opened, the platysma was divided, blunt  dissection down to the level of the plate.  There was fibrous tissue and  some bone overlying both ends of the plate.  This was cleaned off.  The  locking mechanism was a small, round with two small sunken areas that  attached to the screw and using a nerve hook this was twisted loosening this  and the  piece was popped off.  Distally, the piece was removed, as well.  Two distal screws were removed and they were intact.  Two proximal screws  were both broken and the remaining shaft of the screw that was counter sunk  stuck in the bone was left in place.  Palpation revealed there was a large  mobile osteophyte and with the direct inspection, it was obvious that the  plate had been bumping into the vertebra above.  The large anterior  osteophyte was removed with the rongeur.  This area was smoothed down and  palpated with a finger to make sure there was no sharp areas for irritating  the esophagus or trachea.  After irrigation with saline, a Hemovac drain was  placed through a separate stab incision with an in and out technique, 3-0  Vicryl in the platysma, 4-0 Vicryl subcuticular, tincture  of Benzoin, Steri-  Strips, and dressing was applied.  The patient was transported to the  recovery room in stable condition.                                               Mark C. Ophelia Charter, M.D.    MCY/MEDQ  D:  03/20/2003  T:  03/21/2003  Job:  [id number]

## 2010-06-17 NOTE — Op Note (Signed)
NAME:  Anthony Cuevas, Anthony Cuevas                        ACCOUNT NO.:  [id number]   MEDICAL RECORD NO.:  [id number]                   PATIENT TYPE:  INP   LOCATION:  5038                                 FACILITY:  [facility]   PHYSICIAN:  Mark C. Ophelia Charter, M.D.                 DATE OF BIRTH:  04-07-1950   DATE OF PROCEDURE:  05/07/2002  DATE OF DISCHARGE:  05/08/2002                                 OPERATIVE REPORT   PREOPERATIVE DIAGNOSIS:  Previous C5 to C7 anterior cervical fusion with  plating and pseudoarthrosis with broken screws.   POSTOPERATIVE DIAGNOSIS:  Previous C5 to C7 anterior cervical fusion with  plating and pseudoarthrosis with broken screws.   PROCEDURE:  C5 to C7 posterior cervical fusion with interspinous wiring and  right posterior iliac crest bone graft.   SURGEON:  Mark C. Ophelia Charter, M.D.   ASSISTANT:  Genene Churn. Denton Meek.   ANESTHESIA:  GET.   ESTIMATED BLOOD LOSS:  300 cc.   BRIEF HISTORY:  This 60 year old male had a two level anterior cervical  fusion done by Dr. Emilee Hero in 1990 or 1991.  Over the last year, he has  had significant increase in his pain particularly with flexion and x-rays  demonstrate pseudoarthrosis with motion at both levels and both upper screws  were broken.  CT scan of the area showed pseudoarthrosis with collapse of  the allograft bone graft.  MRI scan showed no evidence of foraminal or  central compression.   PROCEDURE:  After induction of general anesthesia, preoperative Ancef  prophylaxis, the patient was placed prone on the horseshoe head holder with  careful padding and positioning, arms tucked at his side.  The right iliac  crest and neck were squared off with 10/10 drapes.  DuraPrep was used, the  area scrubbed with towels, a sterile skin marker on the neck was used where  the patient had a previous posterior cervical laminectomy following the old  incision and Betadine Vi-Drape x 2 was applied.  Laminectomy sheets was  placed at the  head and half sheet inbetween the two operative areas was  placed.   A midline incision was made in the neck following the skin marker line.  The  subcutaneous tissue was sharply dissected following the raphe at the midline  down to the spinous process.  C7 was identified, a hook was placed on it,  there was slight motion between C5 and C6 and C6 and C7.  There was obvious  normal motion between C5 and C4.  Several cross table lateral x-rays were  taken until finally adequate visualization was seen with a Kocher clamp  placed in the interspinous ligament between 5 and 6 and also a second one  between 6 and 7.  Subperiosteal dissection of the lamina was performed at 5,  6, and 7, on each side.  Self-retaining retractor placed.  A bur was used to  decorticate the lamina and the base of the spinous process at 5, 6, and 7.  The right iliac crest was exposed.  The outer table was taken in strips,  cancellous bone graft was harvested with a large curet with care taken not  to penetrate the inner table.  Gelfoam was placed.  The fascia over the hip  was closed while bone graft was prepared with cortical cancellous strips  placed in one side of an emesis basin and the cancellous chips placed in the  other.  A 25 gauge wire was turned into a cable using a hand drill twisting  it.  It was cut on the end, bent, and passed underneath the 6-7 and a notch  placed at the base of C5 and held as the wire was twisted and tightened  down.  Once it was tight and secure, it was tested and fit snugly in the  notch at C5.  Both spinous processes were intact and cortical cancellous and  cancellous strips were packed one piece at a time with meticulous placement  of bone graft.  Finger pressure and bone tap were used to press the bone  graft down and self-retaining retractor was carefully removed to not disturb  the bone graft and the fascia was closed with 0 Vicryl in several layers, 2-  0 Vicryl in the  subcutaneous tissue, and skin staple closure.  The fascia  over the hip was closed with 0 Vicryl.  A Hemovac drain was placed with in  out technique through a separate stab incision using the trocar.  The  subcutaneous tissue was reapproximated with 2-0 Vicryl and skin staple  closure, postop dressings.  The patient was turned supine, extubated, a soft  cervical collar was applied, and the patient was transported to the recovery  room in stable condition.  Instrument counts and needle counts were correct.                                               Mark C. Ophelia Charter, M.D.    MCY/MEDQ  D:  05/07/2002  T:  05/08/2002  Job:  [id number]

## 2010-06-21 ENCOUNTER — Other Ambulatory Visit: Payer: Self-pay | Admitting: *Deleted

## 2010-06-21 NOTE — Telephone Encounter (Signed)
Form from [facility] [employer] is on your desk.  They are asking for refills on cyclobenzaprine.

## 2010-06-22 MED ORDER — CYCLOBENZAPRINE HCL 10 MG PO TABS: ORAL_TABLET | ORAL | Status: DC

## 2010-06-22 NOTE — Telephone Encounter (Signed)
Rx and form faxed to pharmacy as instructed.

## 2010-07-01 ENCOUNTER — Other Ambulatory Visit: Payer: Self-pay

## 2010-07-07 ENCOUNTER — Encounter: Payer: Self-pay | Admitting: Family Medicine

## 2010-12-02 ENCOUNTER — Other Ambulatory Visit: Payer: Self-pay | Admitting: Family Medicine

## 2010-12-02 DIAGNOSIS — E119 Type 2 diabetes mellitus without complications: Secondary | ICD-10-CM

## 2010-12-05 ENCOUNTER — Other Ambulatory Visit (INDEPENDENT_AMBULATORY_CARE_PROVIDER_SITE_OTHER): Payer: Medicare Other

## 2010-12-05 DIAGNOSIS — E119 Type 2 diabetes mellitus without complications: Secondary | ICD-10-CM

## 2010-12-08 ENCOUNTER — Ambulatory Visit (INDEPENDENT_AMBULATORY_CARE_PROVIDER_SITE_OTHER): Payer: Medicare Other | Admitting: Family Medicine

## 2010-12-08 ENCOUNTER — Encounter: Payer: Self-pay | Admitting: Family Medicine

## 2010-12-08 VITALS — BP 118/68 | HR 84 | Temp 98.1°F | Ht 68.5 in | Wt 203.2 lb

## 2010-12-08 DIAGNOSIS — M5090 Cervical disc disorder, unspecified, unspecified cervical region: Secondary | ICD-10-CM

## 2010-12-08 DIAGNOSIS — I10 Essential (primary) hypertension: Secondary | ICD-10-CM

## 2010-12-08 DIAGNOSIS — Z23 Encounter for immunization: Secondary | ICD-10-CM

## 2010-12-08 DIAGNOSIS — E559 Vitamin D deficiency, unspecified: Secondary | ICD-10-CM

## 2010-12-08 DIAGNOSIS — E119 Type 2 diabetes mellitus without complications: Secondary | ICD-10-CM

## 2010-12-08 MED ORDER — SERTRALINE HCL 100 MG PO TABS: 100.0000 mg | ORAL_TABLET | Freq: Every day | ORAL | Status: DC

## 2010-12-08 NOTE — Progress Notes (Signed)
  Subjective:    Patient ID: Anthony Cuevas, male    DOB: [birth year], 60 y.o.   MRN: [id number]  HPI Pt here for six month followup. He started on Vit D daily last time. He is diabetic and has hypertension. He was seen by Dr Ophelia Charter prior to last visit for various arthritic complaints and told to use muscle relaxants. He takes a MVI. He finds his spasms worsen with outside work. He otherwise has some knee pain after walking through the woods.  He has been trying to watch his diet better.    Review of Systems  Constitutional: Negative for fever, chills, diaphoresis, activity change, appetite change and fatigue.  HENT: Negative for hearing loss, ear pain, congestion, sore throat, rhinorrhea, neck pain, neck stiffness, postnasal drip, sinus pressure, tinnitus and ear discharge.   Eyes: Negative for pain, discharge and visual disturbance.  Respiratory: Negative for cough, shortness of breath and wheezing.   Cardiovascular: Negative for chest pain and palpitations.       No SOB w/ exertion  Gastrointestinal:       No heartburn or swallowing problems.  Genitourinary:       No nocturia  Skin:       No itching or dryness.  Neurological:       No tingling or balance problems.  All other systems reviewed and are negative.       Objective:   Physical Exam  Constitutional: He appears well-developed and well-nourished. No distress.  HENT:  Head: Normocephalic and atraumatic.  Right Ear: External ear normal.  Left Ear: External ear normal.  Nose: Nose normal.  Mouth/Throat: Oropharynx is clear and moist.  Eyes: Conjunctivae and EOM are normal. Pupils are equal, round, and reactive to light. Right eye exhibits no discharge. Left eye exhibits no discharge.  Neck: Normal range of motion. Neck supple.  Cardiovascular: Normal rate and regular rhythm.   Pulmonary/Chest: Effort normal and breath sounds normal. He has no wheezes.  Lymphadenopathy:    He has no cervical adenopathy.  Skin: He is  not diaphoretic.          Assessment & Plan:

## 2010-12-08 NOTE — Patient Instructions (Addendum)
RTC 6 mos for Comp Exam and get acquainted with Dr Para March, with labs prior. Start Vit D 5 1000Iu daily, no more than two at a time if poss.  Check Vit D next time.

## 2010-12-08 NOTE — Assessment & Plan Note (Signed)
Good control. Cont curr meds. BP Readings from Last 3 Encounters:  12/08/10 118/68  06/01/10 108/60  06/09/09 138/70

## 2010-12-08 NOTE — Assessment & Plan Note (Signed)
Cont musc relaxers as needed.

## 2010-12-08 NOTE — Assessment & Plan Note (Signed)
Much improved control via A1C greatly improved. Encouraged to continue to watch his diet as is significantly better but can still be improved.  Lab Results  Component Value Date   HGBA1C 7.6* 12/05/2010

## 2010-12-08 NOTE — Assessment & Plan Note (Signed)
Will recheck this today as he had been on 50000 weekly and now has been on 4000, sometimes 5000Iu daily. Will report on phone tree.

## 2010-12-09 LAB — VITAMIN D 25 HYDROXY (VIT D DEFICIENCY, FRACTURES): Vit D, 25-Hydroxy: 27 ng/mL — ABNORMAL LOW (ref 30–89)

## 2011-02-01 DIAGNOSIS — E119 Type 2 diabetes mellitus without complications: Secondary | ICD-10-CM | POA: Diagnosis not present

## 2011-02-22 ENCOUNTER — Telehealth: Payer: Self-pay | Admitting: *Deleted

## 2011-02-22 NOTE — Telephone Encounter (Signed)
Received a call from [facility] with [facility] requesting a call back regarding clarification needed on Rx for insulin syringes and needles.  Phone number is [phone] ext [phone] and a detailed voicemail message may be left in [name]'s mailbox.

## 2011-02-23 ENCOUNTER — Other Ambulatory Visit: Payer: Self-pay | Admitting: *Deleted

## 2011-02-23 ENCOUNTER — Encounter: Payer: Self-pay | Admitting: Family Medicine

## 2011-02-23 NOTE — Telephone Encounter (Signed)
Please let me know what clarification is needed. Thanks.

## 2011-02-23 NOTE — Telephone Encounter (Signed)
Called and had to LM.  I believe the mix up is the size of the syringe.  The order that was in the meds list was a 0.3 mL syringe.  I changed it to a 1 cc syringe with a 31g needle and 5/16 length.

## 2011-02-28 NOTE — Telephone Encounter (Signed)
I have had no further contact about this, hopefully it is taken care of.

## 2011-03-01 NOTE — Telephone Encounter (Signed)
Noted  

## 2011-03-03 ENCOUNTER — Other Ambulatory Visit: Payer: Self-pay | Admitting: *Deleted

## 2011-03-03 MED ORDER — GLIPIZIDE ER 2.5 MG PO TB24: 2.5000 mg | ORAL_TABLET | Freq: Every day | ORAL | Status: DC

## 2011-04-05 DIAGNOSIS — E119 Type 2 diabetes mellitus without complications: Secondary | ICD-10-CM | POA: Diagnosis not present

## 2011-05-25 ENCOUNTER — Other Ambulatory Visit: Payer: Self-pay | Admitting: Family Medicine

## 2011-05-25 DIAGNOSIS — E119 Type 2 diabetes mellitus without complications: Secondary | ICD-10-CM

## 2011-05-25 DIAGNOSIS — R972 Elevated prostate specific antigen [PSA]: Secondary | ICD-10-CM

## 2011-05-30 ENCOUNTER — Other Ambulatory Visit (INDEPENDENT_AMBULATORY_CARE_PROVIDER_SITE_OTHER): Payer: Medicare Other

## 2011-05-30 DIAGNOSIS — E559 Vitamin D deficiency, unspecified: Secondary | ICD-10-CM | POA: Diagnosis not present

## 2011-05-30 DIAGNOSIS — R972 Elevated prostate specific antigen [PSA]: Secondary | ICD-10-CM

## 2011-05-30 DIAGNOSIS — E119 Type 2 diabetes mellitus without complications: Secondary | ICD-10-CM

## 2011-05-30 LAB — COMPREHENSIVE METABOLIC PANEL
ALT: 19 U/L (ref 0–53)
Albumin: 3.8 g/dL (ref 3.5–5.2)
CO2: 28 mEq/L (ref 19–32)
Calcium: 9.2 mg/dL (ref 8.4–10.5)
Chloride: 110 mEq/L (ref 96–112)
GFR: 88.26 mL/min (ref >60.00)
Glucose, Bld: 71 mg/dL (ref 70–99)
Potassium: 4.7 mEq/L (ref 3.5–5.1)
Sodium: 145 mEq/L (ref 135–145)
Total Bilirubin: 0.3 mg/dL (ref 0.3–1.2)
Total Protein: 6.9 g/dL (ref 6.0–8.3)

## 2011-05-30 LAB — LIPID PANEL: Total CHOL/HDL Ratio: 6

## 2011-05-30 LAB — HEMOGLOBIN A1C: Hgb A1c MFr Bld: 6.4 % (ref 4.6–6.5)

## 2011-05-31 LAB — LDL CHOLESTEROL, DIRECT: Direct LDL: 94.9 mg/dL

## 2011-06-06 ENCOUNTER — Other Ambulatory Visit: Payer: Self-pay | Admitting: *Deleted

## 2011-06-06 ENCOUNTER — Ambulatory Visit (INDEPENDENT_AMBULATORY_CARE_PROVIDER_SITE_OTHER): Payer: Medicare Other | Admitting: Family Medicine

## 2011-06-06 ENCOUNTER — Encounter: Payer: Self-pay | Admitting: Family Medicine

## 2011-06-06 VITALS — BP 102/60 | HR 94 | Temp 98.4°F | Wt 204.0 lb

## 2011-06-06 DIAGNOSIS — I10 Essential (primary) hypertension: Secondary | ICD-10-CM | POA: Diagnosis not present

## 2011-06-06 DIAGNOSIS — E785 Hyperlipidemia, unspecified: Secondary | ICD-10-CM | POA: Diagnosis not present

## 2011-06-06 DIAGNOSIS — M5090 Cervical disc disorder, unspecified, unspecified cervical region: Secondary | ICD-10-CM | POA: Diagnosis not present

## 2011-06-06 DIAGNOSIS — E119 Type 2 diabetes mellitus without complications: Secondary | ICD-10-CM | POA: Diagnosis not present

## 2011-06-06 DIAGNOSIS — F17200 Nicotine dependence, unspecified, uncomplicated: Secondary | ICD-10-CM

## 2011-06-06 MED ORDER — VARENICLINE TARTRATE 0.5 MG X 11 & 1 MG X 42 PO MISC: ORAL | Status: DC

## 2011-06-06 MED ORDER — CELECOXIB 200 MG PO CAPS: 200.0000 mg | ORAL_CAPSULE | Freq: Every day | ORAL | Status: DC

## 2011-06-06 MED ORDER — VARENICLINE TARTRATE 1 MG PO TABS: 1.0000 mg | ORAL_TABLET | Freq: Two times a day (BID) | ORAL | Status: AC

## 2011-06-06 MED ORDER — GLIPIZIDE ER 2.5 MG PO TB24: 2.5000 mg | ORAL_TABLET | Freq: Every day | ORAL | Status: DC

## 2011-06-06 MED ORDER — SERTRALINE HCL 100 MG PO TABS: 100.0000 mg | ORAL_TABLET | Freq: Every day | ORAL | Status: DC

## 2011-06-06 MED ORDER — METFORMIN HCL 1000 MG PO TABS: 1000.0000 mg | ORAL_TABLET | Freq: Two times a day (BID) | ORAL | Status: DC

## 2011-06-06 MED ORDER — INSULIN GLARGINE 100 UNIT/ML ~~LOC~~ SOLN: SUBCUTANEOUS | Status: DC

## 2011-06-06 MED ORDER — PIOGLITAZONE HCL 45 MG PO TABS: 45.0000 mg | ORAL_TABLET | Freq: Every day | ORAL | Status: DC

## 2011-06-06 MED ORDER — NIACIN ER (ANTIHYPERLIPIDEMIC) 500 MG PO TBCR: 500.0000 mg | EXTENDED_RELEASE_TABLET | Freq: Every day | ORAL | Status: DC

## 2011-06-06 MED ORDER — CYCLOBENZAPRINE HCL 10 MG PO TABS: ORAL_TABLET | ORAL | Status: DC

## 2011-06-06 MED ORDER — LISINOPRIL 20 MG PO TABS: 20.0000 mg | ORAL_TABLET | Freq: Every day | ORAL | Status: DC

## 2011-06-06 MED ORDER — PRAVASTATIN SODIUM 80 MG PO TABS: 80.0000 mg | ORAL_TABLET | Freq: Every day | ORAL | Status: DC

## 2011-06-06 NOTE — Patient Instructions (Addendum)
If your AM sugar is below 90 then take away 1 unit of insulin on the next dose.  If your AM sugar is above 150 then add 1 unit of insulin on the next dose.  Recheck labs in 6 months before a physical.   Start the chantix and call with concerns.   Take care.  Glad to see you.

## 2011-06-06 NOTE — Progress Notes (Signed)
Smoking, wants to try chantix.  Mood is good.  Not depressed.  Routine indications and instructions and cautions given.  He understands risk of mood and dream changes.   Diabetes:  Using medications without difficulties: yes Hypoglycemic episodes: occ if prolonged fasting Hyperglycemic episodes:no Feet problems: other than occ athletes foot, no Blood Sugars averaging: 110-130 average in AM.   A1c improved to 6.4.   Chronic neck pain, treated with flexeril and celebrex and this is manageable.  He'd like to continue as is.   Elevated Cholesterol: Using medications without problems:yes Muscle aches: some cramping, at baseline Diet compliance: yes Exercise: as tolerated  Hypertension:    Using medication without problems or lightheadedness: yes Chest pain with exertion: no Edema:no Short of breath:no  PMH and SH reviewed  Meds, vitals, and allergies reviewed.   ROS: See HPI.  Otherwise negative.    GEN: nad, alert and oriented HEENT: mucous membranes moist NECK: supple w/o LA CV: rrr. PULM: no inc in wob, no wheeze.   ABD: soft, +bs EXT: no edema SKIN: no acute rash  Diabetic foot exam: Calluses noted but no skin breakdown Normal DP pulses Normal sensation to light touch and monofilament Nails thickened

## 2011-06-08 DIAGNOSIS — F17200 Nicotine dependence, unspecified, uncomplicated: Secondary | ICD-10-CM | POA: Insufficient documentation

## 2011-06-08 NOTE — Assessment & Plan Note (Signed)
Work on diet for TGs and continue statin.

## 2011-06-08 NOTE — Assessment & Plan Note (Signed)
Start chantix.  Routine indications and instructions and cautions given.  He understands risk of mood and dream changes.

## 2011-06-08 NOTE — Assessment & Plan Note (Signed)
Continue current meds.  Pain is manageable.

## 2011-06-08 NOTE — Assessment & Plan Note (Signed)
A1c controlled, continue current meds. D/w pt.

## 2011-06-08 NOTE — Assessment & Plan Note (Signed)
Controlled, continue current meds.   

## 2011-06-17 ENCOUNTER — Other Ambulatory Visit: Payer: Self-pay | Admitting: Family Medicine

## 2011-06-19 NOTE — Telephone Encounter (Signed)
Electronic refill request

## 2011-06-20 NOTE — Telephone Encounter (Signed)
Sent!

## 2011-10-06 DIAGNOSIS — H4011X0 Primary open-angle glaucoma, stage unspecified: Secondary | ICD-10-CM | POA: Diagnosis not present

## 2011-10-20 ENCOUNTER — Ambulatory Visit (INDEPENDENT_AMBULATORY_CARE_PROVIDER_SITE_OTHER): Payer: Medicare Other

## 2011-10-20 DIAGNOSIS — Z23 Encounter for immunization: Secondary | ICD-10-CM

## 2011-10-24 DIAGNOSIS — M25569 Pain in unspecified knee: Secondary | ICD-10-CM | POA: Diagnosis not present

## 2011-10-24 DIAGNOSIS — M1710 Unilateral primary osteoarthritis, unspecified knee: Secondary | ICD-10-CM | POA: Diagnosis not present

## 2011-10-24 DIAGNOSIS — M169 Osteoarthritis of hip, unspecified: Secondary | ICD-10-CM | POA: Diagnosis not present

## 2011-11-02 ENCOUNTER — Encounter (HOSPITAL_COMMUNITY): Payer: Self-pay | Admitting: Pharmacy Technician

## 2011-11-06 ENCOUNTER — Other Ambulatory Visit (HOSPITAL_COMMUNITY): Payer: Self-pay | Admitting: Orthopaedic Surgery

## 2011-11-07 ENCOUNTER — Encounter (HOSPITAL_COMMUNITY)
Admission: RE | Admit: 2011-11-07 | Discharge: 2011-11-07 | Disposition: A | Discharge status: Home / Self Care | Payer: Medicare Other | Source: Ambulatory Visit | Attending: Orthopaedic Surgery | Admitting: Orthopaedic Surgery

## 2011-11-07 ENCOUNTER — Encounter (HOSPITAL_COMMUNITY): Payer: Self-pay

## 2011-11-07 DIAGNOSIS — T84039A Mechanical loosening of unspecified internal prosthetic joint, initial encounter: Secondary | ICD-10-CM | POA: Diagnosis not present

## 2011-11-07 DIAGNOSIS — I10 Essential (primary) hypertension: Secondary | ICD-10-CM | POA: Diagnosis not present

## 2011-11-07 DIAGNOSIS — M659 Synovitis and tenosynovitis, unspecified: Secondary | ICD-10-CM | POA: Diagnosis not present

## 2011-11-07 DIAGNOSIS — M1710 Unilateral primary osteoarthritis, unspecified knee: Secondary | ICD-10-CM | POA: Diagnosis not present

## 2011-11-07 DIAGNOSIS — Z96659 Presence of unspecified artificial knee joint: Secondary | ICD-10-CM | POA: Diagnosis not present

## 2011-11-07 HISTORY — DX: Gastro-esophageal reflux disease without esophagitis: K21.9

## 2011-11-07 LAB — CBC
HCT: 38.1 % — ABNORMAL LOW (ref 39.0–52.0)
MCHC: 34.9 g/dL (ref 30.0–36.0)
MCV: 90.9 fL (ref 78.0–100.0)
Platelets: 228 10*3/uL (ref 150–400)
RDW: 13 % (ref 11.5–15.5)
WBC: 8.8 10*3/uL (ref 4.0–10.5)

## 2011-11-07 LAB — COMPREHENSIVE METABOLIC PANEL
AST: 26 U/L (ref 0–37)
Albumin: 4.1 g/dL (ref 3.5–5.2)
BUN: 18 mg/dL (ref 6–23)
Creatinine, Ser: 0.95 mg/dL (ref 0.50–1.35)
Potassium: 4.4 mEq/L (ref 3.5–5.1)
Total Protein: 7.5 g/dL (ref 6.0–8.3)

## 2011-11-07 LAB — SURGICAL PCR SCREEN
MRSA, PCR: NEGATIVE
Staphylococcus aureus: POSITIVE — AB

## 2011-11-07 LAB — URINALYSIS, ROUTINE W REFLEX MICROSCOPIC
Leukocytes, UA: NEGATIVE
Nitrite: NEGATIVE
Specific Gravity, Urine: 1.007 (ref 1.005–1.030)
Urobilinogen, UA: 0.2 mg/dL (ref 0.0–1.0)
pH: 6 (ref 5.0–8.0)

## 2011-11-07 LAB — PROTIME-INR
INR: 1.13 (ref 0.00–1.49)
Prothrombin Time: 14.3 seconds (ref 11.6–15.2)

## 2011-11-07 LAB — ABO/RH: ABO/RH(D): A POS

## 2011-11-07 LAB — APTT: aPTT: 34 seconds (ref 24–37)

## 2011-11-07 NOTE — Pre-Procedure Instructions (Signed)
20 Anthony Cuevas  11/07/2011   Your procedure is scheduled on:  Wednesday, 11/15/2011@12 :30PM.  Report to [facility] at 10:30 AM.  Call this number if you have problems the morning of surgery: [phone]   Remember:   Do not eat food and drink liquids:After Midnight.    Take these medicines the morning of surgery with A SIP OF WATER: Zoloft, Flexeril( Do not take diabetic meds AM of surgery)   Do not wear jewelry, make-up or nail polish.  Do not wear lotions, powders, or perfumes. You may not wear deodorant.  Do not shave 48 hours prior to surgery. Men may shave face and neck.  Do not bring valuables to the hospital.  Contacts, dentures or bridgework may not be worn into surgery.  Leave suitcase in the car. After surgery it may be brought to your room.  For patients admitted to the hospital, checkout time is 11:00 AM the day of discharge.   Patients discharged the day of surgery will not be allowed to drive home.  Name and phone number of your driver: Hiyab, [name]  Special Instructions: Incentive Spirometry - Practice and bring it with you on the day of surgery. Shower using CHG 2 nights before surgery and the night before surgery.  If you shower the day of surgery use CHG.  Use special wash - you have one bottle of CHG for all showers.  You should use approximately 1/3 of the bottle for each shower.   Please read over the following fact sheets that you were given: Pain Booklet, Coughing and Deep Breathing, Blood Transfusion Information, Total Joint Packet and Surgical Site Infection Prevention

## 2011-11-07 NOTE — Progress Notes (Signed)
Pt here for preadmission.  Total joint booklet given.  PCP: Dr. Para March, [place], [place], [place].  Denies sleep and heart studies.

## 2011-11-17 NOTE — Progress Notes (Signed)
Pt notified to arrive in Short Stay @ 0700;pt verbalized understanding

## 2011-11-17 NOTE — Progress Notes (Signed)
Surgery date changed. Pt called with new arrival time of 0730 on 11/20/11. Verified pt still has blue arm band on for T&S. Labs done within 14 days of surgery. All other instructions remain same.

## 2011-11-17 NOTE — H&P (Signed)
[facility]   A Division of [facility]   [address] [place], [place] [address] Telephone: [phone]  Fax: [phone]     PATIENT: Anthony Cuevas, Anthony Cuevas   MR#: [id number]  DOB: 1950-06-15   Visit Date: 10/24/2011     He has had 6 months of gradual progression of increasing knee pain.  He has been limping.  He is at the point now where he is having some anterior groin pain on the left side.  He has been trying to do some walking with his daughter to get in shape to keep his weight down since he is retired.  Worked for the [employer].  He used to do a lot of walking.  He has had increased pain.  This bothers him when he tries to sleep.  The opposite knee is doing well.  His pain has been in the anterior groin, more pain around the knee, intermittent swelling and occasional catching in this knee and sharp pain.  He has used Advil as well as a muscle relaxant on an as needed basis.  His other medications are reviewed including those listed from [employer] [facility] at [facility].     CURRENT MEDICATIONS:  Celebrex 200 mg 1 p.o. daily, vitamin D 1000 units 5 by mouth once a week.  Flexeril 1 p.o. twice a day, 1 at bedtime as needed.  Omega 3 fatty acids 1 p.o. daily, glipizide XL 2.5 mg tablet by mouth daily.  Lantus 75 units subcutaneous once a day, Zestril 20 mg 1 p.o. daily.  Glucophage 1000 mg 1 p.o. b.i.d. with meal, breakfast and supper.  Niacin 500 mg 1 p.o. daily.  Actos 45 mg p.o. daily.  Pravachol 80 mg 1 daily.  Zoloft 100 mg 1 daily.  Chantix was previously used and he was able to quit smoking taking it.  He is about 90+ days without a cigarette.   ALLERGIES:    He has no known allergies.   PHYSICAL EXAMINATION:  The patient is alert and oriented.  Blood pressure 127/72.  Height 5 feet 10 inches.  Weight 195.  He is alert and oriented.  Extraocular movements intact.  Good visual acuity with glasses.  No rash over exposed skin.  Well-healed  anterior incision from medial unicompartmental total knee arthroplasty.  Reflexes are 2+.  He is ambulatory with a left knee limp.  He has some tenderness in the left anterior groin.  Internal rotation is 20 degrees left hip, 30 with the right.  Both externally rotate 45 degrees with mild discomfort.  No hip flexion contracture.  The knee is tender.  He has lateral joint line tenderness, patellofemoral tenderness with loading.  The opposite knee shows good range of motion.  No swelling, no pitting edema.  Distal pulses are 2+.  There is mild calcification of the common femoral and also internal iliac artery on plain radiograph.     RADIOGRAPHS:   AP pelvis shows normal hips.  No stress fracture.  Normal joint space.  AP and frog leg of the left hip looks good.  X-rays show cemented unicompartmental with slight loosening underneath the tibia component, lateral osteophytes and patellofemoral osteophytes.   ASSESSMENT:  Left knee osteoarthritis status post unicompartmental knee out 10+ years.  He has done well with his uni, but at this point the rest of the knee has progressed with osteoarthritis.   PLAN:  The plan would be conversion of his uni to total knee arthroplasty.  Procedure  discussed.  He may have some tendinitis in his hip from his knee limp.  No evidence of osteoarthritis or stress fracture on his hip films and pelvis films.  The patient is preop for conversion of a total knee arthroplasty.  I expect he could use standard primary total knee and will not need revision stem, but we will have those on standby.   For additional information please see handwritten notes, reports, orders and prescriptions in this chart.      Mark C. Ophelia Charter, M.D.    Auto-Authenticated by Veverly Fells. Ophelia Charter, M.D.

## 2011-11-19 MED ORDER — CEFAZOLIN SODIUM-DEXTROSE 2-3 GM-% IV SOLR
2.0000 g | INTRAVENOUS | Status: AC
  Administered 2011-11-20: 2 g via INTRAVENOUS
  Filled 2011-11-19: qty 50

## 2011-11-20 ENCOUNTER — Encounter (HOSPITAL_COMMUNITY): Payer: Self-pay | Admitting: Anesthesiology

## 2011-11-20 ENCOUNTER — Inpatient Hospital Stay (HOSPITAL_COMMUNITY)
Admission: RE | Admit: 2011-11-20 | Discharge: 2011-11-22 | DRG: 468 | Disposition: A | Discharge status: Home / Self Care | Payer: Medicare Other | Source: Ambulatory Visit | Attending: Orthopaedic Surgery | Admitting: Orthopaedic Surgery

## 2011-11-20 ENCOUNTER — Encounter (HOSPITAL_COMMUNITY)
Admission: RE | Disposition: A | Discharge status: Home / Self Care | Payer: Self-pay | Source: Ambulatory Visit | Attending: Orthopaedic Surgery

## 2011-11-20 ENCOUNTER — Inpatient Hospital Stay (HOSPITAL_COMMUNITY): Payer: Medicare Other | Admitting: Anesthesiology

## 2011-11-20 DIAGNOSIS — M659 Unspecified synovitis and tenosynovitis, unspecified site: Secondary | ICD-10-CM | POA: Diagnosis present

## 2011-11-20 DIAGNOSIS — M1712 Unilateral primary osteoarthritis, left knee: Secondary | ICD-10-CM | POA: Diagnosis present

## 2011-11-20 DIAGNOSIS — T84039A Mechanical loosening of unspecified internal prosthetic joint, initial encounter: Principal | ICD-10-CM | POA: Diagnosis present

## 2011-11-20 DIAGNOSIS — Z96659 Presence of unspecified artificial knee joint: Secondary | ICD-10-CM | POA: Diagnosis not present

## 2011-11-20 DIAGNOSIS — Z01812 Encounter for preprocedural laboratory examination: Secondary | ICD-10-CM

## 2011-11-20 DIAGNOSIS — M1710 Unilateral primary osteoarthritis, unspecified knee: Secondary | ICD-10-CM | POA: Diagnosis not present

## 2011-11-20 DIAGNOSIS — M25569 Pain in unspecified knee: Secondary | ICD-10-CM | POA: Diagnosis not present

## 2011-11-20 DIAGNOSIS — Y849 Medical procedure, unspecified as the cause of abnormal reaction of the patient, or of later complication, without mention of misadventure at the time of the procedure: Secondary | ICD-10-CM | POA: Diagnosis present

## 2011-11-20 DIAGNOSIS — G8918 Other acute postprocedural pain: Secondary | ICD-10-CM | POA: Diagnosis not present

## 2011-11-20 DIAGNOSIS — M169 Osteoarthritis of hip, unspecified: Secondary | ICD-10-CM | POA: Diagnosis not present

## 2011-11-20 HISTORY — PX: KNEE ARTHROPLASTY: SHX992

## 2011-11-20 LAB — GLUCOSE, CAPILLARY: Glucose-Capillary: 234 mg/dL — ABNORMAL HIGH (ref 70–99)

## 2011-11-20 SURGERY — ARTHROPLASTY, KNEE, TOTAL, USING IMAGELESS COMPUTER-ASSISTED NAVIGATION
Anesthesia: General | Site: Knee | Laterality: Left | Wound class: Clean

## 2011-11-20 MED ORDER — SODIUM CHLORIDE 0.9 % IR SOLN
Status: DC | PRN
  Administered 2011-11-20: 3000 mL
  Administered 2011-11-20: 1000 mL

## 2011-11-20 MED ORDER — HYDROMORPHONE HCL PF 1 MG/ML IJ SOLN
INTRAMUSCULAR | Status: AC
  Filled 2011-11-20: qty 1

## 2011-11-20 MED ORDER — ONDANSETRON HCL 4 MG PO TABS: 4.0000 mg | ORAL_TABLET | Freq: Four times a day (QID) | ORAL | Status: DC | PRN

## 2011-11-20 MED ORDER — OXYCODONE HCL 5 MG PO TABS: 5.0000 mg | ORAL_TABLET | Freq: Once | ORAL | Status: DC | PRN

## 2011-11-20 MED ORDER — DOCUSATE SODIUM 100 MG PO CAPS
100.0000 mg | ORAL_CAPSULE | Freq: Two times a day (BID) | ORAL | Status: DC
  Administered 2011-11-20 – 2011-11-22 (×4): 100 mg via ORAL
  Filled 2011-11-20 (×5): qty 1

## 2011-11-20 MED ORDER — OXYCODONE HCL 5 MG/5ML PO SOLN: 5.0000 mg | Freq: Once | ORAL | Status: DC | PRN

## 2011-11-20 MED ORDER — METOCLOPRAMIDE HCL 5 MG/ML IJ SOLN: 10.0000 mg | Freq: Once | INTRAMUSCULAR | Status: DC | PRN

## 2011-11-20 MED ORDER — SODIUM CHLORIDE 0.9 % IJ SOLN: 9.0000 mL | INTRAMUSCULAR | Status: DC | PRN

## 2011-11-20 MED ORDER — FENTANYL CITRATE 0.05 MG/ML IJ SOLN
INTRAMUSCULAR | Status: DC | PRN
  Administered 2011-11-20: 100 ug via INTRAVENOUS
  Administered 2011-11-20 (×6): 50 ug via INTRAVENOUS
  Administered 2011-11-20: 100 ug via INTRAVENOUS

## 2011-11-20 MED ORDER — MIDAZOLAM HCL 5 MG/5ML IJ SOLN
INTRAMUSCULAR | Status: DC | PRN
  Administered 2011-11-20: 2 mg via INTRAVENOUS

## 2011-11-20 MED ORDER — KETOROLAC TROMETHAMINE 15 MG/ML IJ SOLN
15.0000 mg | Freq: Four times a day (QID) | INTRAMUSCULAR | Status: AC
  Administered 2011-11-20 – 2011-11-21 (×4): 15 mg via INTRAVENOUS
  Filled 2011-11-20 (×4): qty 1

## 2011-11-20 MED ORDER — NEOSTIGMINE METHYLSULFATE 1 MG/ML IJ SOLN
INTRAMUSCULAR | Status: DC | PRN
  Administered 2011-11-20: 1.5 mg via INTRAVENOUS

## 2011-11-20 MED ORDER — GLYCOPYRROLATE 0.2 MG/ML IJ SOLN
INTRAMUSCULAR | Status: DC | PRN
  Administered 2011-11-20: .2 mg via INTRAVENOUS

## 2011-11-20 MED ORDER — NALOXONE HCL 0.4 MG/ML IJ SOLN: 0.4000 mg | INTRAMUSCULAR | Status: DC | PRN

## 2011-11-20 MED ORDER — POTASSIUM CHLORIDE IN NACL 20-0.45 MEQ/L-% IV SOLN
INTRAVENOUS | Status: DC
  Administered 2011-11-20: 75 mL/h via INTRAVENOUS
  Filled 2011-11-20 (×5): qty 1000

## 2011-11-20 MED ORDER — DIPHENHYDRAMINE HCL 12.5 MG/5ML PO ELIX: 12.5000 mg | ORAL_SOLUTION | Freq: Four times a day (QID) | ORAL | Status: DC | PRN

## 2011-11-20 MED ORDER — SENNOSIDES-DOCUSATE SODIUM 8.6-50 MG PO TABS: 1.0000 | ORAL_TABLET | Freq: Every evening | ORAL | Status: DC | PRN

## 2011-11-20 MED ORDER — GLIPIZIDE ER 2.5 MG PO TB24
2.5000 mg | ORAL_TABLET | Freq: Every day | ORAL | Status: DC
  Administered 2011-11-21 – 2011-11-22 (×2): 2.5 mg via ORAL
  Filled 2011-11-20 (×3): qty 1

## 2011-11-20 MED ORDER — VITAMIN D3 25 MCG (1000 UNIT) PO TABS
5000.0000 [IU] | ORAL_TABLET | ORAL | Status: DC
  Administered 2011-11-20: 5000 [IU] via ORAL
  Filled 2011-11-20: qty 5

## 2011-11-20 MED ORDER — ROCURONIUM BROMIDE 100 MG/10ML IV SOLN
INTRAVENOUS | Status: DC | PRN
  Administered 2011-11-20: 50 mg via INTRAVENOUS

## 2011-11-20 MED ORDER — EPHEDRINE SULFATE 50 MG/ML IJ SOLN
INTRAMUSCULAR | Status: DC | PRN
  Administered 2011-11-20: 7.5 mg via INTRAVENOUS
  Administered 2011-11-20 (×3): 5 mg via INTRAVENOUS

## 2011-11-20 MED ORDER — ONDANSETRON HCL 4 MG/2ML IJ SOLN
INTRAMUSCULAR | Status: DC | PRN
  Administered 2011-11-20: 4 mg via INTRAVENOUS

## 2011-11-20 MED ORDER — DEXAMETHASONE SODIUM PHOSPHATE 10 MG/ML IJ SOLN
INTRAMUSCULAR | Status: DC | PRN
  Administered 2011-11-20: 8 mg via INTRAVENOUS

## 2011-11-20 MED ORDER — FLEET ENEMA 7-19 GM/118ML RE ENEM: 1.0000 | ENEMA | Freq: Once | RECTAL | Status: AC | PRN

## 2011-11-20 MED ORDER — ACETAMINOPHEN 650 MG RE SUPP: 650.0000 mg | Freq: Four times a day (QID) | RECTAL | Status: DC | PRN

## 2011-11-20 MED ORDER — DIPHENHYDRAMINE HCL 50 MG/ML IJ SOLN: 12.5000 mg | Freq: Four times a day (QID) | INTRAMUSCULAR | Status: DC | PRN

## 2011-11-20 MED ORDER — MENTHOL 3 MG MT LOZG: 1.0000 | LOZENGE | OROMUCOSAL | Status: DC | PRN

## 2011-11-20 MED ORDER — PROPOFOL 10 MG/ML IV BOLUS
INTRAVENOUS | Status: DC | PRN
  Administered 2011-11-20: 200 mg via INTRAVENOUS

## 2011-11-20 MED ORDER — ZOLPIDEM TARTRATE 5 MG PO TABS: 5.0000 mg | ORAL_TABLET | Freq: Every evening | ORAL | Status: DC | PRN

## 2011-11-20 MED ORDER — ONDANSETRON HCL 4 MG/2ML IJ SOLN: 4.0000 mg | Freq: Four times a day (QID) | INTRAMUSCULAR | Status: DC | PRN

## 2011-11-20 MED ORDER — MORPHINE SULFATE (PF) 1 MG/ML IV SOLN
INTRAVENOUS | Status: AC
  Filled 2011-11-20: qty 25

## 2011-11-20 MED ORDER — INSULIN GLARGINE 100 UNIT/ML ~~LOC~~ SOLN
75.0000 [IU] | Freq: Every day | SUBCUTANEOUS | Status: DC
  Administered 2011-11-20 – 2011-11-21 (×2): 75 [IU] via SUBCUTANEOUS

## 2011-11-20 MED ORDER — ARTIFICIAL TEARS OP OINT
TOPICAL_OINTMENT | OPHTHALMIC | Status: DC | PRN
  Administered 2011-11-20: 1 via OPHTHALMIC

## 2011-11-20 MED ORDER — CEFAZOLIN SODIUM-DEXTROSE 2-3 GM-% IV SOLR
2.0000 g | Freq: Four times a day (QID) | INTRAVENOUS | Status: AC
  Administered 2011-11-20 (×2): 2 g via INTRAVENOUS
  Filled 2011-11-20 (×2): qty 50

## 2011-11-20 MED ORDER — MORPHINE SULFATE (PF) 1 MG/ML IV SOLN
INTRAVENOUS | Status: DC
  Administered 2011-11-20: 1.5 mg via INTRAVENOUS
  Administered 2011-11-20 – 2011-11-21 (×2): 3 mg via INTRAVENOUS
  Administered 2011-11-21 (×2): 1.5 mg via INTRAVENOUS
  Administered 2011-11-21: 6 mg via INTRAVENOUS

## 2011-11-20 MED ORDER — LISINOPRIL 20 MG PO TABS
20.0000 mg | ORAL_TABLET | Freq: Every day | ORAL | Status: DC
  Administered 2011-11-20 – 2011-11-22 (×3): 20 mg via ORAL
  Filled 2011-11-20 (×3): qty 1

## 2011-11-20 MED ORDER — PHENOL 1.4 % MT LIQD: 1.0000 | OROMUCOSAL | Status: DC | PRN

## 2011-11-20 MED ORDER — METHOCARBAMOL 500 MG PO TABS
500.0000 mg | ORAL_TABLET | Freq: Four times a day (QID) | ORAL | Status: DC | PRN
  Administered 2011-11-21 – 2011-11-22 (×3): 500 mg via ORAL
  Filled 2011-11-20 (×3): qty 1

## 2011-11-20 MED ORDER — HYDROMORPHONE HCL PF 1 MG/ML IJ SOLN
0.2500 mg | INTRAMUSCULAR | Status: DC | PRN
  Administered 2011-11-20 (×4): 0.5 mg via INTRAVENOUS

## 2011-11-20 MED ORDER — INSULIN ASPART 100 UNIT/ML ~~LOC~~ SOLN
0.0000 [IU] | Freq: Three times a day (TID) | SUBCUTANEOUS | Status: DC
  Administered 2011-11-20: 8 [IU] via SUBCUTANEOUS
  Administered 2011-11-21: 3 [IU] via SUBCUTANEOUS
  Administered 2011-11-21 – 2011-11-22 (×3): 2 [IU] via SUBCUTANEOUS
  Administered 2011-11-22: 3 [IU] via SUBCUTANEOUS

## 2011-11-20 MED ORDER — SERTRALINE HCL 100 MG PO TABS
100.0000 mg | ORAL_TABLET | Freq: Every day | ORAL | Status: DC
  Administered 2011-11-21 – 2011-11-22 (×2): 100 mg via ORAL
  Filled 2011-11-20 (×2): qty 1

## 2011-11-20 MED ORDER — SIMVASTATIN 5 MG PO TABS
5.0000 mg | ORAL_TABLET | Freq: Every day | ORAL | Status: DC
  Administered 2011-11-20 – 2011-11-21 (×2): 5 mg via ORAL
  Filled 2011-11-20 (×3): qty 1

## 2011-11-20 MED ORDER — LACTATED RINGERS IV SOLN
INTRAVENOUS | Status: DC | PRN
  Administered 2011-11-20 (×3): via INTRAVENOUS

## 2011-11-20 MED ORDER — OXYCODONE HCL 5 MG PO TABS
5.0000 mg | ORAL_TABLET | ORAL | Status: DC | PRN
  Administered 2011-11-21 – 2011-11-22 (×5): 10 mg via ORAL
  Filled 2011-11-20 (×6): qty 2

## 2011-11-20 MED ORDER — METOCLOPRAMIDE HCL 10 MG PO TABS: 5.0000 mg | ORAL_TABLET | Freq: Three times a day (TID) | ORAL | Status: DC | PRN

## 2011-11-20 MED ORDER — ACETAMINOPHEN 325 MG PO TABS: 650.0000 mg | ORAL_TABLET | Freq: Four times a day (QID) | ORAL | Status: DC | PRN

## 2011-11-20 MED ORDER — BISACODYL 10 MG RE SUPP
10.0000 mg | Freq: Every day | RECTAL | Status: DC | PRN
  Administered 2011-11-22: 10 mg via RECTAL
  Filled 2011-11-20: qty 1

## 2011-11-20 MED ORDER — METFORMIN HCL 500 MG PO TABS
1000.0000 mg | ORAL_TABLET | Freq: Two times a day (BID) | ORAL | Status: DC
  Administered 2011-11-21 – 2011-11-22 (×3): 1000 mg via ORAL
  Filled 2011-11-20 (×5): qty 2

## 2011-11-20 MED ORDER — BUPIVACAINE-EPINEPHRINE PF 0.5-1:200000 % IJ SOLN
INTRAMUSCULAR | Status: DC | PRN
  Administered 2011-11-20: 25 mL

## 2011-11-20 MED ORDER — INSULIN ASPART 100 UNIT/ML ~~LOC~~ SOLN
0.0000 [IU] | Freq: Every day | SUBCUTANEOUS | Status: DC
  Administered 2011-11-20: 2 [IU] via SUBCUTANEOUS

## 2011-11-20 MED ORDER — METOCLOPRAMIDE HCL 5 MG/ML IJ SOLN: 5.0000 mg | Freq: Three times a day (TID) | INTRAMUSCULAR | Status: DC | PRN

## 2011-11-20 MED ORDER — PIOGLITAZONE HCL 45 MG PO TABS
45.0000 mg | ORAL_TABLET | Freq: Every day | ORAL | Status: DC
  Administered 2011-11-21 – 2011-11-22 (×2): 45 mg via ORAL
  Filled 2011-11-20 (×3): qty 1

## 2011-11-20 MED ORDER — CELECOXIB 200 MG PO CAPS
200.0000 mg | ORAL_CAPSULE | Freq: Every day | ORAL | Status: DC
  Administered 2011-11-22: 200 mg via ORAL
  Filled 2011-11-20: qty 1

## 2011-11-20 MED ORDER — ASPIRIN EC 325 MG PO TBEC
325.0000 mg | DELAYED_RELEASE_TABLET | Freq: Every day | ORAL | Status: DC
  Administered 2011-11-21 – 2011-11-22 (×2): 325 mg via ORAL
  Filled 2011-11-20 (×3): qty 1

## 2011-11-20 MED ORDER — METHOCARBAMOL 100 MG/ML IJ SOLN
500.0000 mg | INTRAVENOUS | Status: AC
  Administered 2011-11-20: 500 mg via INTRAVENOUS
  Filled 2011-11-20: qty 5

## 2011-11-20 MED ORDER — ALUM & MAG HYDROXIDE-SIMETH 200-200-20 MG/5ML PO SUSP: 30.0000 mL | ORAL | Status: DC | PRN

## 2011-11-20 MED ORDER — METHOCARBAMOL 100 MG/ML IJ SOLN
500.0000 mg | Freq: Four times a day (QID) | INTRAVENOUS | Status: DC | PRN
  Filled 2011-11-20: qty 5

## 2011-11-20 MED ORDER — OMEGA-3-ACID ETHYL ESTERS 1 G PO CAPS
1.0000 g | ORAL_CAPSULE | Freq: Every day | ORAL | Status: DC
  Administered 2011-11-20 – 2011-11-22 (×3): 1 g via ORAL
  Filled 2011-11-20 (×3): qty 1

## 2011-11-20 MED ORDER — NIACIN ER (ANTIHYPERLIPIDEMIC) 500 MG PO TBCR
500.0000 mg | EXTENDED_RELEASE_TABLET | Freq: Every day | ORAL | Status: DC
  Administered 2011-11-20 – 2011-11-21 (×2): 500 mg via ORAL
  Filled 2011-11-20 (×3): qty 1

## 2011-11-20 MED ORDER — OMEGA-3 FATTY ACIDS 1000 MG PO CAPS: 1.0000 | ORAL_CAPSULE | Freq: Every day | ORAL | Status: DC

## 2011-11-20 SURGICAL SUPPLY — 64 items
BANDAGE ELASTIC 4 VELCRO ST LF (GAUZE/BANDAGES/DRESSINGS) ×2 IMPLANT
BANDAGE ELASTIC 6 VELCRO ST LF (GAUZE/BANDAGES/DRESSINGS) ×2 IMPLANT
BANDAGE ESMARK 6X9 LF (GAUZE/BANDAGES/DRESSINGS) ×1 IMPLANT
BENZOIN TINCTURE PRP APPL 2/3 (GAUZE/BANDAGES/DRESSINGS) ×2 IMPLANT
BLADE SAGITTAL 25.0X1.19X90 (BLADE) ×2 IMPLANT
BLADE SAW SGTL 13X75X1.27 (BLADE) ×2 IMPLANT
BLADE SURG 10 STRL SS (BLADE) ×2 IMPLANT
BNDG ELASTIC 6X10 VLCR STRL LF (GAUZE/BANDAGES/DRESSINGS) ×2 IMPLANT
BNDG ESMARK 6X9 LF (GAUZE/BANDAGES/DRESSINGS) ×2
BOWL SMART MIX CTS (DISPOSABLE) ×2 IMPLANT
CEMENT HV SMART SET (Cement) ×4 IMPLANT
CLOTH BEACON ORANGE TIMEOUT ST (SAFETY) ×2 IMPLANT
COVER BACK TABLE 24X17X13 BIG (DRAPES) IMPLANT
COVER SURGICAL LIGHT HANDLE (MISCELLANEOUS) ×2 IMPLANT
CUFF TOURNIQUET SINGLE 34IN LL (TOURNIQUET CUFF) ×2 IMPLANT
CUFF TOURNIQUET SINGLE 44IN (TOURNIQUET CUFF) IMPLANT
DRAPE ORTHO SPLIT 77X108 STRL (DRAPES) ×2
DRAPE SURG ORHT 6 SPLT 77X108 (DRAPES) ×2 IMPLANT
DRAPE U-SHAPE 47X51 STRL (DRAPES) ×2 IMPLANT
DRSG PAD ABDOMINAL 8X10 ST (GAUZE/BANDAGES/DRESSINGS) ×2 IMPLANT
DURAPREP 26ML APPLICATOR (WOUND CARE) ×2 IMPLANT
ELECT REM PT RETURN 9FT ADLT (ELECTROSURGICAL) ×2
ELECTRODE REM PT RTRN 9FT ADLT (ELECTROSURGICAL) ×1 IMPLANT
FACESHIELD LNG OPTICON STERILE (SAFETY) ×2 IMPLANT
GAUZE XEROFORM 5X9 LF (GAUZE/BANDAGES/DRESSINGS) ×2 IMPLANT
GLOVE BIOGEL PI IND STRL 7.5 (GLOVE) ×2 IMPLANT
GLOVE BIOGEL PI IND STRL 8 (GLOVE) ×1 IMPLANT
GLOVE BIOGEL PI INDICATOR 7.5 (GLOVE) ×2
GLOVE BIOGEL PI INDICATOR 8 (GLOVE) ×1
GLOVE ECLIPSE 7.0 STRL STRAW (GLOVE) ×2 IMPLANT
GLOVE ORTHO TXT STRL SZ7.5 (GLOVE) ×2 IMPLANT
GOWN PREVENTION PLUS LG XLONG (DISPOSABLE) ×2 IMPLANT
GOWN PREVENTION PLUS XLARGE (GOWN DISPOSABLE) ×6 IMPLANT
GOWN STRL NON-REIN LRG LVL3 (GOWN DISPOSABLE) ×4 IMPLANT
HANDPIECE INTERPULSE COAX TIP (DISPOSABLE) ×1
IMMOBILIZER KNEE 22 UNIV (SOFTGOODS) ×2 IMPLANT
KIT BASIN OR (CUSTOM PROCEDURE TRAY) ×2 IMPLANT
KIT ROOM TURNOVER OR (KITS) ×2 IMPLANT
MANIFOLD NEPTUNE II (INSTRUMENTS) ×2 IMPLANT
MARKER SPHERE PSV REFLC THRD 5 (MARKER) ×6 IMPLANT
NEEDLE 1/2 CIR MAYO (NEEDLE) ×2 IMPLANT
NEEDLE HYPO 25GX1X1/2 BEV (NEEDLE) ×2 IMPLANT
NS IRRIG 1000ML POUR BTL (IV SOLUTION) ×2 IMPLANT
PACK TOTAL JOINT (CUSTOM PROCEDURE TRAY) ×2 IMPLANT
PAD ARMBOARD 7.5X6 YLW CONV (MISCELLANEOUS) ×4 IMPLANT
PAD CAST 4YDX4 CTTN HI CHSV (CAST SUPPLIES) ×1 IMPLANT
PADDING CAST COTTON 4X4 STRL (CAST SUPPLIES) ×1
PADDING CAST COTTON 6X4 STRL (CAST SUPPLIES) ×2 IMPLANT
PIN SCHANZ 4MM 130MM (PIN) ×8 IMPLANT
SET HNDPC FAN SPRY TIP SCT (DISPOSABLE) ×1 IMPLANT
SPONGE GAUZE 4X4 12PLY (GAUZE/BANDAGES/DRESSINGS) ×2 IMPLANT
STAPLER VISISTAT 35W (STAPLE) ×2 IMPLANT
STRIP CLOSURE SKIN 1/2X4 (GAUZE/BANDAGES/DRESSINGS) ×4 IMPLANT
SUCTION FRAZIER TIP 10 FR DISP (SUCTIONS) ×2 IMPLANT
SUT ETHIBOND NAB CT1 #1 30IN (SUTURE) ×4 IMPLANT
SUT VIC AB 2-0 CT1 27 (SUTURE) ×2
SUT VIC AB 2-0 CT1 TAPERPNT 27 (SUTURE) ×2 IMPLANT
SUT VICRYL 4-0 PS2 18IN ABS (SUTURE) ×2 IMPLANT
SUT VICRYL AB 2 0 TIES (SUTURE) ×2 IMPLANT
SYR CONTROL 10ML LL (SYRINGE) ×2 IMPLANT
TOWEL OR 17X24 6PK STRL BLUE (TOWEL DISPOSABLE) ×2 IMPLANT
TOWEL OR 17X26 10 PK STRL BLUE (TOWEL DISPOSABLE) ×2 IMPLANT
TRAY FOLEY CATH 14FR (SET/KITS/TRAYS/PACK) ×2 IMPLANT
WATER STERILE IRR 1000ML POUR (IV SOLUTION) ×2 IMPLANT

## 2011-11-20 NOTE — Anesthesia Procedure Notes (Signed)
Anesthesia Regional Block:  Femoral nerve block  Pre-Anesthetic Checklist: ,, timeout performed, Correct Patient, Correct Site, Correct Laterality, Correct Procedure, Correct Position, site marked, Risks and benefits discussed,  Surgical consent,  Pre-op evaluation,  At surgeon's request and post-op pain management  Laterality: Left  Prep: chloraprep       Needles:   Needle Type: Other     Needle Length: 9cm  Needle Gauge: 21    Additional Needles:  Procedures: ultrasound guided Femoral nerve block Narrative:  Start time: 11/20/2011 9:06 AM End time: 11/20/2011 9:13 AM Injection made incrementally with aspirations every 5 mL.  Performed by: Personally  Anesthesiologist: C. Aubre Quincy MD  Additional Notes: Ultrasound guidance used to: id relevant anatomy, confirm needle position, local anesthetic spread, avoidance of vascular puncture. Picture saved. No complications. Block performed personally by Janetta Hora. Gelene Mink, MD    Femoral nerve block

## 2011-11-20 NOTE — Progress Notes (Signed)
UR COMPLETED  

## 2011-11-20 NOTE — Transfer of Care (Signed)
Immediate Anesthesia Transfer of Care Note  Patient: Anthony Cuevas  Procedure(s) Performed: Procedure(s) (LRB) with comments: COMPUTER ASSISTED TOTAL KNEE ARTHROPLASTY (Left) - Conversion Left Knee Medial Uni to Total Knee Arthroplasty-Cemented  Patient Location: PACU  Anesthesia Type: General and GA combined with regional for post-op pain  Level of Consciousness: awake, alert  and oriented  Airway & Oxygen Therapy: Patient Spontanous Breathing and Patient connected to face mask oxygen  Post-op Assessment: Report given to PACU RN  Post vital signs: Reviewed and stable  Complications: No apparent anesthesia complications

## 2011-11-20 NOTE — Preoperative (Signed)
Beta Blockers   Reason not to administer Beta Blockers:Not Applicable 

## 2011-11-20 NOTE — Anesthesia Postprocedure Evaluation (Signed)
Anesthesia Post Note  Patient: Anthony Cuevas  Procedure(s) Performed: Procedure(s) (LRB): COMPUTER ASSISTED TOTAL KNEE ARTHROPLASTY (Left)  Anesthesia type: general  Patient location: PACU  Post pain: Pain level controlled  Post assessment: Patient's Cardiovascular Status Stable  Last Vitals:  Filed Vitals:   11/20/11 1519  BP: 131/71  Pulse: 107  Temp:   Resp: 13    Post vital signs: Reviewed and stable  Level of consciousness: sedated  Complications: No apparent anesthesia complications

## 2011-11-20 NOTE — Progress Notes (Signed)
Orthopedic Tech Progress Note Patient Details:  Anthony Cuevas 06-Sep-1950 [id number]  CPM Left Knee CPM Left Knee: On Left Knee Flexion (Degrees): 40  Left Knee Extension (Degrees): 0  Additional Comments: trapeze bar patient helper   Nikki Dom 11/20/2011, 3:09 PM

## 2011-11-20 NOTE — Anesthesia Preprocedure Evaluation (Signed)
Anesthesia Evaluation  Patient identified by MRN, date of birth, ID band Patient awake    Reviewed: Allergy & Precautions, H&P , NPO status , Patient's Chart, lab work & pertinent test results, reviewed documented beta blocker date and time   Airway Mallampati: II TM Distance: >3 FB Neck ROM: full    Dental   Pulmonary neg pulmonary ROS, Current Smoker,  breath sounds clear to auscultation        Cardiovascular hypertension, On Medications Rhythm:regular     Neuro/Psych  Headaches, PSYCHIATRIC DISORDERS  Neuromuscular disease    GI/Hepatic Neg liver ROS, GERD-  Medicated and Controlled,  Endo/Other  diabetes, Insulin Dependent and Oral Hypoglycemic Agents  Renal/GU negative Renal ROS  negative genitourinary   Musculoskeletal   Abdominal   Peds  Hematology negative hematology ROS (+)   Anesthesia Other Findings See surgeon's H&P   Reproductive/Obstetrics negative OB ROS                           Anesthesia Physical Anesthesia Plan  ASA: III  Anesthesia Plan: General   Post-op Pain Management:    Induction: Intravenous  Airway Management Planned: Oral ETT  Additional Equipment:   Intra-op Plan:   Post-operative Plan: Extubation in OR  Informed Consent: I have reviewed the patients History and Physical, chart, labs and discussed the procedure including the risks, benefits and alternatives for the proposed anesthesia with the patient or authorized representative who has indicated his/her understanding and acceptance.   Dental Advisory Given  Plan Discussed with: CRNA and Surgeon  Anesthesia Plan Comments:         Anesthesia Quick Evaluation

## 2011-11-20 NOTE — Interval H&P Note (Signed)
History and Physical Interval Note:  11/20/2011 10:40 AM  Anthony Cuevas  has presented today for surgery, with the diagnosis of Left Knee Osteoarthritis  The various methods of treatment have been discussed with the patient and family. After consideration of risks, benefits and other options for treatment, the patient has consented to  Procedure(s) (LRB) with comments: COMPUTER ASSISTED TOTAL KNEE ARTHROPLASTY (Left) - Conversion Left Knee Medial Uni to Total Knee Arthroplasty-Cemented as a surgical intervention .  The patient's history has been reviewed, patient examined, no change in status, stable for surgery.  I have reviewed the patient's chart and labs.  Questions were answered to the patient's satisfaction.     Ashtan Laton C

## 2011-11-20 NOTE — Brief Op Note (Cosign Needed)
11/20/2011  1:19 PM  PATIENT:  Anthony Cuevas  61 y.o. male  PRE-OPERATIVE DIAGNOSIS:  Left Knee Osteoarthritis  POST-OPERATIVE DIAGNOSIS:  Left Knee Osteoarthritis  PROCEDURE:  Procedure(s) (LRB) with comments: COMPUTER ASSISTED TOTAL KNEE ARTHROPLASTY (Left) - Conversion Left Knee Medial Uni to Total Knee Arthroplasty-Cemented  SURGEON:  Surgeon(s) and Role:    * Eldred Manges, MD - Primary  PHYSICIAN ASSISTANT: Maud Deed [facility]  ASSISTANTS: none   ANESTHESIA:   general  EBL:  Total I/O In: 2000 [I.V.:2000] Out: 400 [Urine:300; Blood:100]  BLOOD ADMINISTERED:none  DRAINS: none   LOCAL MEDICATIONS USED:  NONE  SPECIMEN:  No Specimen  DISPOSITION OF SPECIMEN:  N/A  COUNTS:  YES  TOURNIQUET:   Total Tourniquet Time Documented: Thigh (Left) - 82 minutes  DICTATION: .Note written in EPIC  PLAN OF CARE: Admit to inpatient   PATIENT DISPOSITION:  PACU - hemodynamically stable.   Delay start of Pharmacological VTE agent (>24hrs) due to surgical blood loss or risk of bleeding: no

## 2011-11-20 NOTE — Progress Notes (Signed)
Orthopedic Tech Progress Note Patient Details:  Anthony Cuevas 1950-10-19 [id number]  Patient ID: [name], male   DOB: [birth year], 61 y.o.   MRN: [id number] Viewed order from rn order list  Nikki Dom 11/20/2011, 3:09 PM

## 2011-11-21 ENCOUNTER — Encounter (HOSPITAL_COMMUNITY): Payer: Self-pay | Admitting: Orthopaedic Surgery

## 2011-11-21 LAB — GLUCOSE, CAPILLARY: Glucose-Capillary: 139 mg/dL — ABNORMAL HIGH (ref 70–99)

## 2011-11-21 LAB — CBC
MCHC: 34.3 g/dL (ref 30.0–36.0)
RDW: 13.2 % (ref 11.5–15.5)

## 2011-11-21 LAB — BASIC METABOLIC PANEL
GFR calc Af Amer: >90 mL/min (ref >90)
GFR calc non Af Amer: 88 mL/min — ABNORMAL LOW (ref >90)
Potassium: 4.4 mEq/L (ref 3.5–5.1)
Sodium: 138 mEq/L (ref 135–145)

## 2011-11-21 LAB — HEMOGLOBIN A1C
Hgb A1c MFr Bld: 7.5 % — ABNORMAL HIGH (ref <5.7)
Mean Plasma Glucose: 169 mg/dL — ABNORMAL HIGH (ref <117)

## 2011-11-21 NOTE — Op Note (Signed)
Anthony Cuevas, Anthony Cuevas NO.:  [id number]  MEDICAL RECORD NO.:  [id number]  LOCATION:  [id number]                        FACILITY:  [facility]  PHYSICIAN:  Juhi Lagrange C. Ophelia Charter, M.D.    DATE OF BIRTH:  1950/07/11  DATE OF PROCEDURE:  11/20/2011 DATE OF DISCHARGE:                              OPERATIVE REPORT   PREOPERATIVE DIAGNOSIS:  Painful left unicompartmental hemiarthroplasty, left knee with loosening and synovitis.  POSTOPERATIVE DIAGNOSIS:  Painful left unicompartmental hemiarthroplasty, left knee with loosening and synovitis.  PROCEDURE:  Left knee revision.  Removal of unicompartmental Hemiarthroplasty both femoral and tibial components,  and placement of DePuy rotating platform, #5 femur, #5 standard tibia, 10 mm rotating platform, 38 mm 3 PEG all poly patella  TOTAL KNEE REVISION Femoral component is with lugs.  SURGEON:  Ebubechukwu Jedlicka C. Ophelia Charter, MD  ASSISTANT:  Wende Neighbors, PA-C, medically necessary and present for the entire procedure.  BRIEF HISTORY:  This 61 year old male had previous medial unicompartmental hemiarthroplasty done 10+ years ago.  He has had progressive pain.  He has had some varus and synovitis.  Workup was negative for infection including sed rate, CRP.  He has been afebrile. X-ray show erosive changes in lateral compartment as well.  PROCEDURE:  After induction of general anesthesia, orotracheal intubation, proximal thigh tourniquet, after Foley catheter was placed with sterile technique, lateral post heel bump, prepping with DuraPrep up to the level of the tourniquet, usual impervious stockinette, Coban, split sheets, drapes, sterile skin marker and Betadine, Steri-Drape was used to seal the skin.  Time-out procedure was completed.  The patient had an old medial uni and for that reason incision was started proximally in the midline and extended over to the medial uni scar. This started about mid patella and extended down to the tibial  plateau and then was curved back to the midline.  A medial parapatellar deep retinaculum incision was made, cutting between the medial 1/3rd and lateral 2/3rd of the quad tendon, __________ resecting 10 mm off the patella.  There was rice bodies with synovitis present, chronic synovitis changes which was sharply debrided with scalpel and a synovectomy was performed.  Inspection revealed that there was erosive changes in the posterior medial aspect of the tibial component with smooth oval type shape area where that had lost 2-3 mm of poly.  Two pins were placed in the distal femur, 2 pins were placed and stab incision in the tibia and once pins were placed, the computer models were generated for the femur and also the tibia.  Once models were made which shows that there was 60 degrees of varus and they lacked 2 degrees reaching full extension.  The unicompartmental arthroplasty was removed using osteotome without significant bone destruction.  There was medial osteophytes present that had redeveloped which were trimmed off with a rongeur.  ACL and PCL were resected, lateral meniscus was resected. After resection, 9 mm were taken off of the distal femur, #5 size was selected, confirmed with the trial.  Chamfer cuts were made.  Box cut was not made until the tibia was resected, initially was a pin taking 8 mm and this still did not take enough medially.  It was backed down an additional 6 mm and taking 14 mm to give fresh cut on the distal tibia, obviously taking significantly more lateral than medial but did not sacrifice lateral collateral ligaments and did not expose the proximal fibula.  Posterior spurs were removed off the femur.  Box cut was made. Trials were inserted initially, expecting to use thicker poly.  Thicker poly trials were tried but they would not allow the knee to be out in full extension and when the thin was placed that allowed full extension. Cuts looked good.   Collateral ligaments were balanced with the medial side being slightly tighter.  A 3/4 curved osteotome was used slightly to release the deep fibers of the medial collateral ligament.  A 3/4 curve was run up posteriorly at femur on the medial side to release any scar tissue.  This allowed full extension, flexion extension balance. Computer read that this was in 6 degrees of varus.  However, with the long rod, alignment looked good and all cuts had been less than 0.5 mm and when cuts were made initially for distal tibia, cut verification showed that it was at neutral position and not in varus and not in valgus.  There had been no collapse on the tibial side and the pins were removed.  Pulsatile lavage was used and cement was premixed, vacuum mixed, and tibia was inserted first followed by femur, then placement of 10 mm poly which gave identical stability findings and the 38 mm 3 PEG all poly patella.  Peg was replaced to set down and flush, was held with the patellar clamp until the bone was hard at 15 minutes.  At this point, the tourniquet was deflated.  Meticulous hemostasis and then standard closure.  Two pin sites in the mid tibia were closed with 2-0 Vicryl, Steri-Strips.  Deep layer was closed with nonabsorbable and the deep retinaculum superficial was reapproximated with 2-0 Vicryl, subcuticular skin closure, postop dressing, and then transferred to the recovery room.  Instrument count and needle count was correct and knee immobilizer was applied.  Procedure was as posted with revision total knee arthroplasty performed.     Sammy Cassar C. Ophelia Charter, M.D.     MCY/MEDQ  D:  11/20/2011  T:  11/21/2011  Job:  [id number]

## 2011-11-21 NOTE — Evaluation (Signed)
Physical Therapy Evaluation Patient Details Name: Anthony Cuevas MRN: [id number] DOB: 11-Nov-1950 Today's Date: 11/21/2011 Time: 4782-9562 PT Time Calculation (min): 19 min  PT Assessment / Plan / Recommendation Clinical Impression  Pt did well with inital mobility s/p TKR.  Expect he will progress according to path and d/c to home with HHPT    PT Assessment  Patient needs continued PT services    Follow Up Recommendations  Home health PT    Does the patient have the potential to tolerate intense rehabilitation      Barriers to Discharge        Equipment Recommendations  Rolling walker with 5" wheels    Recommendations for Other Services     Frequency 7X/week    Precautions / Restrictions Precautions Precautions: Knee Required Braces or Orthoses: Knee Immobilizer - Left Knee Immobilizer - Left: On when out of bed or walking Restrictions Weight Bearing Restrictions: Yes LLE Weight Bearing: Weight bearing as tolerated   Pertinent Vitals/Pain Pt with increased pain in ant knee with ambulation      Mobility  Bed Mobility Bed Mobility: Supine to Sit;Sit to Supine Supine to Sit: 3: Mod assist Transfers Transfers: Sit to Stand;Stand to Sit Sit to Stand: 4: Min assist Stand to Sit: 4: Min assist Details for Transfer Assistance: multimodal cues for hand placement and technique Ambulation/Gait Ambulation/Gait Assistance: 3: Mod assist Ambulation Distance (Feet): 15 Feet Assistive device: Rolling walker Ambulation/Gait Assistance Details: pt with some increased pain in knee with weight bearing in gait Gait Pattern: Step-to pattern Gait velocity: decreased General Gait Details: antalgic post op intial gait Stairs: No Wheelchair Mobility Wheelchair Mobility: No    Shoulder Instructions     Exercises Total Joint Exercises Ankle Circles/Pumps: AROM;Left;Supine;5 reps Quad Sets: AROM;Left;5 reps;Supine Gluteal Sets: AROM;Both;5 reps;Standing Short Arc Quad:  AAROM;Left;5 reps;Supine Straight Leg Raises: AROM;Left;5 reps;Supine   PT Diagnosis: Difficulty walking;Abnormality of gait;Acute pain  PT Problem List: Decreased strength;Decreased range of motion;Pain;Decreased knowledge of use of DME;Decreased mobility;Decreased activity tolerance PT Treatment Interventions: DME instruction;Gait training;Stair training;Functional mobility training;Therapeutic exercise   PT Goals Acute Rehab PT Goals PT Goal Formulation: With patient Time For Goal Achievement: 11/28/11 Potential to Achieve Goals: Good Pt will go Supine/Side to Sit: with supervision PT Goal: Supine/Side to Sit - Progress: Goal set today Pt will go Sit to Supine/Side: with supervision PT Goal: Sit to Supine/Side - Progress: Goal set today Pt will go Sit to Stand: with supervision PT Goal: Sit to Stand - Progress: Goal set today Pt will go Stand to Sit: with supervision PT Goal: Stand to Sit - Progress: Goal set today Pt will Ambulate: 51 - 150 feet;with supervision;with least restrictive assistive device PT Goal: Ambulate - Progress: Goal set today Pt will Go Up / Down Stairs: 3-5 stairs;with modified independence PT Goal: Up/Down Stairs - Progress: Goal set today Pt will Perform Home Exercise Program: with supervision, verbal cues required/provided PT Goal: Perform Home Exercise Program - Progress: Goal set today  Visit Information  Last PT Received On: 11/21/11 Assistance Needed: +2    Subjective Data  Subjective: I practiced going up the steps before I got here Patient Stated Goal: to return home   Prior Functioning  Home Living Lives With: Family Available Help at Discharge: Family Type of Home: Mobile home Home Access: Stairs to enter Secretary/administrator of Steps: 4 Entrance Stairs-Rails: Right;Left;Can reach both Home Layout: One level Home Adaptive Equipment: None Prior Function Level of Independence: Independent Able to Take Stairs?:  Yes Communication Communication: No difficulties    Cognition  Overall Cognitive Status: Appears within functional limits for tasks assessed/performed Arousal/Alertness: Awake/alert Orientation Level: Appears intact for tasks assessed Behavior During Session: [facility] for tasks performed    Extremity/Trunk Assessment Right Lower Extremity Assessment RLE ROM/Strength/Tone: Within functional levels Left Lower Extremity Assessment LLE ROM/Strength/Tone: Deficits LLE ROM/Strength/Tone Deficits: pt with post op surgical dressing with limited ROM to about 45 degress flexion.  He has about 3/5 strength LLE Sensation: WFL - Light Touch;WFL - Proprioception LLE Coordination: WFL - gross/fine motor Trunk Assessment Trunk Assessment: Normal   Balance Balance Balance Assessed: Yes Static Sitting Balance Static Sitting - Balance Support: No upper extremity supported Static Sitting - Level of Assistance: 5: Stand by assistance Static Sitting - Comment/# of Minutes: 3 Static Standing Balance Static Standing - Balance Support: Bilateral upper extremity supported;During functional activity Static Standing - Level of Assistance: 5: Stand by assistance Static Standing - Comment/# of Minutes: 3  End of Session PT - End of Session Equipment Utilized During Treatment: Gait belt;Left knee immobilizer Activity Tolerance: Patient tolerated treatment well Patient left: in chair;with call bell/phone within reach  GP     [employer] [place], [place] [phone] 11/21/2011, 12:13 PM

## 2011-11-21 NOTE — Progress Notes (Signed)
Subjective: 1 Day Post-Op Procedure(s) (LRB): COMPUTER ASSISTED TOTAL KNEE ARTHROPLASTY (Left) Patient reports pain as moderate.    Objective: Vital signs in last 24 hours: Temp:  [98.5 F (36.9 C)-98.9 F (37.2 C)] 98.5 F (36.9 C) (10/22 0540) Pulse Rate:  [92-116] 102  (10/22 0540) Resp:  [10-20] 18  (10/22 0540) BP: (116-161)/(54-85) 122/54 mmHg (10/22 0540) SpO2:  [97 %-100 %] 97 % (10/22 0540)  Intake/Output from previous day: 10/21 0701 - 10/22 0700 In: 2925 [I.V.:2925] Out: 2725 [Urine:2625; Blood:100] Intake/Output this shift:     Basename 11/21/11 0535  HGB 10.7*    Basename 11/21/11 0535  WBC 12.8*  RBC 3.43*  HCT 31.2*  PLT 234    Basename 11/21/11 0535  NA 138  K 4.4  CL 101  CO2 25  BUN 19  CREATININE 0.99  GLUCOSE 156*  CALCIUM 8.8   No results found for this basename: LABPT:2,INR:2 in the last 72 hours  Neurologically intact Compartment soft  Assessment/Plan: 1 Day Post-Op Procedure(s) (LRB): COMPUTER ASSISTED TOTAL KNEE ARTHROPLASTY (Left) Up with therapy  Dorthula Bier C 11/21/2011, 7:21 AM

## 2011-11-21 NOTE — Care Management Note (Signed)
    Page 1 of 2   11/21/2011     1:24:12 PM   CARE MANAGEMENT NOTE 11/21/2011  Patient:  Anthony Cuevas,Anthony Cuevas   Account Number:  [id number]  Date Initiated:  11/21/2011  Documentation initiated by:  Anette Guarneri  Subjective/Objective Assessment:   left TKA  will need [facility] services and DME     Action/Plan:   Home with [facility] services/[facility]  DME/[employer]   Anticipated DC Date:  11/23/2011   Anticipated DC Plan:  HOME W HOME HEALTH SERVICES      DC Planning Services  CM consult      PAC Choice  DURABLE MEDICAL EQUIPMENT  HOME HEALTH   Choice offered to / List presented to:  C-1 Patient   DME arranged  3-N-1  WALKER - ROLLING      DME agency  [employer]     HH arranged  HH-2 PT      HH agency  [facility]   Status of service:  Completed, signed off Medicare Important Message given?   (If response is "NO", the following Medicare IM given date fields will be blank) Date Medicare IM given:   Date Additional Medicare IM given:    Discharge Disposition:    Per UR Regulation:  Reviewed for med. necessity/level of care/duration of stay  If discussed at Long Length of Stay Meetings, dates discussed:    Comments:  11/21/11  13:21  Anette Guarneri RN/CM Spoke with patient regrading HHPT, patient choice for [facility] services is [facility] contacted [facility], arranged for HHPT to begin after discharge DME-3n1/RW and CPM has been arranged with [employer] RW will be delivered to room prior to discharge, CPM and 3n1 to be delivered to home after discharge.

## 2011-11-21 NOTE — Progress Notes (Signed)
[facility]  Patient Status: New  [facility] is providing the following services: PT  Thank you for this referral  If patient discharges after hours, please call [phone].   Jodene Nam 11/21/2011, 12:56 PM

## 2011-11-21 NOTE — Progress Notes (Signed)
Physical Therapy Treatment Patient Details Name: Anthony Cuevas MRN: [id number] DOB: Sep 10, 1950 Today's Date: 11/21/2011 Time: 1337-1400 PT Time Calculation (min): 23 min  PT Assessment / Plan / Recommendation Comments on Treatment Session  Pt improving in mobility and gait    Follow Up Recommendations  Home health PT     Does the patient have the potential to tolerate intense rehabilitation     Barriers to Discharge        Equipment Recommendations  Rolling walker with 5" wheels;3 in 1 bedside comode    Recommendations for Other Services    Frequency 7X/week   Plan Discharge plan remains appropriate;Frequency remains appropriate    Precautions / Restrictions Precautions Precautions: Knee Required Braces or Orthoses: Knee Immobilizer - Left Knee Immobilizer - Left: On when out of bed or walking Restrictions Weight Bearing Restrictions: Yes LLE Weight Bearing: Weight bearing as tolerated   Pertinent Vitals/Pain Pain 5/10 after ambulation   Mobility  Bed Mobility Bed Mobility: Sit to Supine Supine to Sit: 3: Mod assist Sit to Supine: 3: Mod assist Details for Bed Mobility Assistance: assist to bring legs up onto bed Transfers Transfers: Sit to Stand;Stand to Sit Sit to Stand: 4: Min assist Stand to Sit: 4: Min assist Details for Transfer Assistance: multimodal cues for hand placement and technique Ambulation/Gait Ambulation/Gait Assistance: 4: Min assist Ambulation Distance (Feet): 100 Feet Assistive device: Rolling walker Ambulation/Gait Assistance Details: verbal cues for technique Gait Pattern: Step-to pattern Gait velocity: decreased General Gait Details: improving gait with RW Stairs: No Wheelchair Mobility Wheelchair Mobility: No    Exercises Total Joint Exercises Ankle Circles/Pumps: AROM;Left;Supine;5 reps Quad Sets: AROM;Left;5 reps;Supine Gluteal Sets: AROM;Both;5 reps;Standing Short Arc Quad: Supine Straight Leg Raises: AROM;Left;5  reps;Supine Long Arc Quad: AROM;Left;5 reps;Seated Knee Flexion: AROM;Left;5 reps;Seated   PT Diagnosis: Difficulty walking;Abnormality of gait;Acute pain  PT Problem List: Decreased strength;Decreased range of motion;Pain;Decreased knowledge of use of DME;Decreased mobility;Decreased activity tolerance PT Treatment Interventions: DME instruction;Gait training;Stair training;Functional mobility training;Therapeutic exercise   PT Goals Acute Rehab PT Goals PT Goal Formulation: With patient Time For Goal Achievement: 11/28/11 Potential to Achieve Goals: Good Pt will go Supine/Side to Sit: with supervision PT Goal: Supine/Side to Sit - Progress: Goal set today Pt will go Sit to Supine/Side: with supervision PT Goal: Sit to Supine/Side - Progress: Progressing toward goal Pt will go Sit to Stand: with supervision PT Goal: Sit to Stand - Progress: Progressing toward goal Pt will go Stand to Sit: with supervision PT Goal: Stand to Sit - Progress: Progressing toward goal Pt will Ambulate: 51 - 150 feet;with supervision;with least restrictive assistive device PT Goal: Ambulate - Progress: Progressing toward goal Pt will Go Up / Down Stairs: 3-5 stairs;with modified independence PT Goal: Up/Down Stairs - Progress: Goal set today Pt will Perform Home Exercise Program: with supervision, verbal cues required/provided PT Goal: Perform Home Exercise Program - Progress: Progressing toward goal  Visit Information  Last PT Received On: 11/21/11 Assistance Needed: +1    Subjective Data  Subjective: I can walk Patient Stated Goal: to go home   Cognition  Overall Cognitive Status: Appears within functional limits for tasks assessed/performed Arousal/Alertness: Awake/alert Orientation Level: Appears intact for tasks assessed Behavior During Session: [facility] for tasks performed    Balance  Balance Balance Assessed: Yes Static Sitting Balance Static Sitting - Balance Support: No upper extremity  supported Static Sitting - Level of Assistance: 5: Stand by assistance Static Sitting - Comment/# of Minutes: 3 Static Standing Balance  Static Standing - Balance Support: Bilateral upper extremity supported;During functional activity Static Standing - Level of Assistance: 5: Stand by assistance Static Standing - Comment/# of Minutes: 3  End of Session PT - End of Session Equipment Utilized During Treatment: Gait belt;Left knee immobilizer Activity Tolerance: Patient tolerated treatment well Patient left: in bed;with call bell/phone within reach   GP     Rosey Bath K. Manson Passey, [place] [phone] 11/21/2011, 3:21 PM

## 2011-11-22 ENCOUNTER — Other Ambulatory Visit: Payer: Self-pay | Admitting: Family Medicine

## 2011-11-22 DIAGNOSIS — E119 Type 2 diabetes mellitus without complications: Secondary | ICD-10-CM

## 2011-11-22 DIAGNOSIS — Z125 Encounter for screening for malignant neoplasm of prostate: Secondary | ICD-10-CM

## 2011-11-22 LAB — GLUCOSE, CAPILLARY: Glucose-Capillary: 149 mg/dL — ABNORMAL HIGH (ref 70–99)

## 2011-11-22 LAB — CBC
HCT: 28.6 % — ABNORMAL LOW (ref 39.0–52.0)
Hemoglobin: 9.7 g/dL — ABNORMAL LOW (ref 13.0–17.0)
MCH: 31 pg (ref 26.0–34.0)
MCHC: 33.9 g/dL (ref 30.0–36.0)
MCV: 91.4 fL (ref 78.0–100.0)
RBC: 3.13 MIL/uL — ABNORMAL LOW (ref 4.22–5.81)

## 2011-11-22 MED ORDER — ASPIRIN 325 MG PO TBEC: 325.0000 mg | DELAYED_RELEASE_TABLET | Freq: Every day | ORAL | Status: DC

## 2011-11-22 MED ORDER — OXYCODONE HCL 5 MG PO TABS: 5.0000 mg | ORAL_TABLET | ORAL | Status: DC | PRN

## 2011-11-22 NOTE — Progress Notes (Signed)
I agree with the following treatment note after reviewing documentation.   Johnston, Hasan Douse Brynn   OTR/L Pager: [phone] Office: [phone] .   

## 2011-11-22 NOTE — Progress Notes (Signed)
Physical Therapy Treatment Patient Details Name: Anthony Cuevas MRN: [id number] DOB: November 04, 1950 Today's Date: 11/22/2011 Time: 1325-1405 PT Time Calculation (min): 40 min  PT Assessment / Plan / Recommendation Comments on Treatment Session  Pt mobility and pain much improved from am session. Applied ice to knee and hip for pain relief.  Elevated L LE on Pillows for edema reduction. Pt should be clear to d/c home when cleared by MD.      Follow Up Recommendations  Home health PT     Does the patient have the potential to tolerate intense rehabilitation     Barriers to Discharge        Equipment Recommendations  Rolling walker with 5" wheels;3 in 1 bedside comode    Recommendations for Other Services    Frequency 7X/week   Plan Discharge plan remains appropriate;Frequency remains appropriate    Precautions / Restrictions Precautions Precautions: Knee Required Braces or Orthoses: Knee Immobilizer - Left Knee Immobilizer - Left: On when out of bed or walking Restrictions Weight Bearing Restrictions: Yes LLE Weight Bearing: Weight bearing as tolerated   Pertinent Vitals/Pain Pt reporting pain in knee 4-6/10. No need for pain medication per pt.      Mobility  Bed Mobility Bed Mobility: Supine to Sit;Sit to Supine Supine to Sit: HOB flat;6: Modified independent (Device/Increase time) Sit to Supine: 6: Modified independent (Device/Increase time) Details for Bed Mobility Assistance: Instructed pt to enter and exit bed on Right side of bed for minimal pain and effort.  Pt reports much easier to exit on Right vs left.   Transfers Transfers: Sit to Stand;Stand to Sit Sit to Stand: 6: Modified independent (Device/Increase time);With upper extremity assist;From bed;From toilet Stand to Sit: 6: Modified independent (Device/Increase time);With upper extremity assist;To bed;To toilet Ambulation/Gait Ambulation/Gait Assistance: 6: Modified independent (Device/Increase  time) Ambulation Distance (Feet): 150 Feet Assistive device: Rolling walker Ambulation/Gait Assistance Details: Pt instructed in reciprocal gait and to d/c lifting walker.   Gait Pattern: Step-to pattern;Step-through pattern Gait velocity: WFL  Stairs: Yes Stairs Assistance: 5: Supervision Stairs Assistance Details (indicate cue type and reason): Cues for sequencing.  Stair Management Technique: Two rails Number of Stairs: 4  Wheelchair Mobility Wheelchair Mobility: No    Exercises Total Joint Exercises Goniometric ROM: 10-82 degrees AAROM in L Knee.     PT Diagnosis:    PT Problem List:   PT Treatment Interventions:     PT Goals Acute Rehab PT Goals PT Goal Formulation: With patient Time For Goal Achievement: 11/28/11 Potential to Achieve Goals: Good Pt will go Supine/Side to Sit: with supervision PT Goal: Supine/Side to Sit - Progress: Met Pt will go Sit to Supine/Side: with supervision PT Goal: Sit to Supine/Side - Progress: Met Pt will go Sit to Stand: with supervision PT Goal: Sit to Stand - Progress: Met Pt will go Stand to Sit: with supervision PT Goal: Stand to Sit - Progress: Met Pt will Ambulate: 51 - 150 feet;with supervision;with least restrictive assistive device PT Goal: Ambulate - Progress: Met Pt will Go Up / Down Stairs: 3-5 stairs;with modified independence PT Goal: Up/Down Stairs - Progress: Met Pt will Perform Home Exercise Program: with supervision, verbal cues required/provided  Visit Information  Last PT Received On: 11/22/11 Assistance Needed: +1    Subjective Data  Subjective: I feel a lot better than I did this morning.     Cognition  Overall Cognitive Status: Appears within functional limits for tasks assessed/performed Arousal/Alertness: Awake/alert Orientation Level: Appears intact for  tasks assessed Behavior During Session: [facility] for tasks performed    Balance     End of Session PT - End of Session Equipment Utilized During  Treatment: Gait belt;Left knee immobilizer Activity Tolerance: Patient tolerated treatment well Patient left: in chair;with call bell/phone within reach   GP     Marvelle Span 11/22/2011, 5:13 PM Tabita Corbo L. Nataniel Gasper DPT [phone]

## 2011-11-22 NOTE — Progress Notes (Signed)
Subjective: 2 Days Post-Op Procedure(s) (LRB): COMPUTER ASSISTED TOTAL KNEE ARTHROPLASTY (Left) Patient reports pain as moderate.  Concerned about blisters on lower legs. Walking well.  Used CPM several hours yesterday and last night.  Objective: Vital signs in last 24 hours: Temp:  [97.9 F (36.6 C)-99.2 F (37.3 C)] 98.8 F (37.1 C) (10/23 0629) Pulse Rate:  [98-114] 102  (10/23 0629) Resp:  [16-18] 18  (10/23 0800) BP: (108-130)/(59-64) 130/64 mmHg (10/23 0629) SpO2:  [96 %-99 %] 99 % (10/23 0629)  Intake/Output from previous day: 10/22 0701 - 10/23 0700 In: -  Out: 450 [Urine:450] Intake/Output this shift:     Basename 11/22/11 0530 11/21/11 0535  HGB 9.7* 10.7*    Basename 11/22/11 0530 11/21/11 0535  WBC 11.2* 12.8*  RBC 3.13* 3.43*  HCT 28.6* 31.2*  PLT 206 234    Basename 11/21/11 0535  NA 138  K 4.4  CL 101  CO2 25  BUN 19  CREATININE 0.99  GLUCOSE 156*  CALCIUM 8.8   No results found for this basename: LABPT:2,INR:2 in the last 72 hours  Neurovascular intact multiple fracture blisters circumfirentially on calf and distal incision. calf with edema but non tender compartments. Neg Homans Assessment/Plan: 2 Days Post-Op Procedure(s) (LRB): COMPUTER ASSISTED TOTAL KNEE ARTHROPLASTY (Left) Up with therapy Plan for discharge tomorrow if swelling decreased in calf.   DC CPM today.  Ice and elevation to leg Will cover the blisters with xeroform gauze and wrap lightly with kerlex and ace.  Continue with ambulation today. If increased pain will get doppler study.  Currently doesn't have signs of DVT  Sandhya Denherder M 11/22/2011, 8:29 AM

## 2011-11-22 NOTE — Progress Notes (Signed)
Occupational Therapy Discharge Patient Details Name: Anthony Cuevas MRN: [id number] DOB: [birth year] Today's Date: 11/22/2011 Time: 7829-5621 OT Time Calculation (min): 10 min  Patient discharged from OT services secondary to Pt.able to perform ADL's at mod independence level. Verbalized how to don/doff knee immobilizer correctly. Educated pt on AE for LB bathing/dressing if needed once home. No further acute OT needs at this time. .  Please see latest therapy progress note for current level of functioning and progress toward goals.    Progress and discharge plan discussed with patient and/or caregiver: Patient/Caregiver agrees with plan  GO     [name] 11/22/2011, 3:58 PM

## 2011-11-22 NOTE — Evaluation (Signed)
Occupational Therapy Evaluation Patient Details Name: Anthony Cuevas MRN: [id number] DOB: Apr 24, 1950 Today's Date: 11/22/2011 Time: 4098-1191 OT Time Calculation (min): 10 min  OT Assessment / Plan / Recommendation Clinical Impression  Pt. 61 yo male s/p left TKA. Pt is doing very well and able to perform transfers, ambulation and ADL's with mod independence. Will have family available throughout the day PRN once home. No further acute OT needs, OT to sign off.      OT Assessment  Patient does not need any further OT services    Follow Up Recommendations  No OT follow up    Barriers to Discharge      Equipment Recommendations  Rolling walker with 5" wheels;3 in 1 bedside comode    Recommendations for Other Services    Frequency       Precautions / Restrictions Precautions Precautions: Knee Required Braces or Orthoses: Knee Immobilizer - Left Restrictions Weight Bearing Restrictions: Yes LLE Weight Bearing: Weight bearing as tolerated   Pertinent Vitals/Pain No pain reported    ADL  Eating/Feeding: Performed;Independent Where Assessed - Eating/Feeding: Bed level Grooming: Performed;Wash/dry hands;Modified independent Where Assessed - Grooming: Unsupported standing Toilet Transfer: Performed;Modified independent Toilet Transfer Method: Sit to Barista: Regular height toilet Tub/Shower Transfer: Landscape architect Method: Science writer: Walk in Scientist, research (physical sciences) Used: Gait belt;Rolling walker Transfers/Ambulation Related to ADLs: mod independence for transfers and ambulation in room. Slightly impulsive to stand up from bed before RW placed in front of him.  ADL Comments: Pt. dressed upon arrival in room. Pt. states he is able to dress himself independently sitting EOB or in the chair. Educated on use of AE if pt were to need it once home. Completed toilet transfer and grooming task at sink  level with mod indpendence. Pt states he will have family available PRN when at home.     OT Diagnosis:    OT Problem List:   OT Treatment Interventions:     OT Goals    Visit Information  Last OT Received On: 11/22/11 Assistance Needed: +1    Subjective Data  Subjective: I can pretty much do things on my own without help Patient Stated Goal: To go home tomorrow   Prior Functioning     Home Living Lives With: Family Available Help at Discharge: Family Type of Home: Mobile home Home Access: Stairs to enter Secretary/administrator of Steps: 4 Entrance Stairs-Rails: Right;Left;Can reach both Home Layout: One level Bathroom Shower/Tub: Health visitor: Standard Bathroom Accessibility: Yes How Accessible: Accessible via walker Home Adaptive Equipment: None Prior Function Level of Independence: Independent Able to Take Stairs?: Yes Driving: Yes Communication Communication: No difficulties Dominant Hand: Left         Vision/Perception     Cognition  Overall Cognitive Status: Appears within functional limits for tasks assessed/performed Arousal/Alertness: Awake/alert Orientation Level: Appears intact for tasks assessed Behavior During Session: [facility] for tasks performed    Extremity/Trunk Assessment Right Upper Extremity Assessment RUE ROM/Strength/Tone: Within functional levels Left Upper Extremity Assessment LUE ROM/Strength/Tone: Within functional levels     Mobility Bed Mobility Bed Mobility: Supine to Sit;Sit to Supine Supine to Sit: 5: Supervision;HOB flat Sit to Supine: 5: Supervision;HOB flat Details for Bed Mobility Assistance: supervision for safety  Transfers Transfers: Sit to Stand;Stand to Sit Sit to Stand: 6: Modified independent (Device/Increase time);With upper extremity assist;From bed;From toilet Stand to Sit: 6: Modified independent (Device/Increase time);With upper extremity assist;To bed;To toilet  End of Session OT - End of Session Equipment Utilized During Treatment: Gait belt;Left knee immobilizer Activity Tolerance: Patient tolerated treatment well Patient left: in bed;with call bell/phone within reach Nurse Communication: Mobility status  GO     [name] 11/22/2011, 3:58 PM

## 2011-11-22 NOTE — Evaluation (Signed)
I agree with the following treatment note after reviewing documentation.   Johnston, Masayoshi Couzens Brynn   OTR/L Pager: [phone] Office: [phone] .   

## 2011-11-22 NOTE — Progress Notes (Signed)
PT PROGRESS NOTE  11/22/11 0900  PT Visit Information  Last PT Received On 11/22/11  PT Time Calculation  PT Start Time 0940  PT Stop Time 1009  PT Time Calculation (min) 29 min  Subjective Data  Subjective I am having a lot of pain today.    Patient Stated Goal to go home  Precautions  Precautions Knee  Required Braces or Orthoses Knee Immobilizer - Left  Knee Immobilizer - Left On when out of bed or walking  Restrictions  Weight Bearing Restrictions Yes  LLE Weight Bearing WBAT  Cognition  Overall Cognitive Status Appears within functional limits for tasks assessed/performed  Arousal/Alertness Awake/alert  Orientation Level Appears intact for tasks assessed  Behavior During Session [facility] for tasks performed  Bed Mobility  Bed Mobility Supine to Sit  Supine to Sit Not tested (comment)  Sit to Supine 4: Min assist  Details for Bed Mobility Assistance assist to bring legs up onto bed  Transfers  Transfers Sit to Stand;Stand to Sit  Sit to Stand 4: Min guard;From chair/3-in-1;With upper extremity assist  Stand to Sit 4: Min guard;To bed;With upper extremity assist  Details for Transfer Assistance Verbal cueing for technique.   Ambulation/Gait  Ambulation/Gait Assistance Not tested (comment)  Total Joint Exercises  Ankle Circles/Pumps AROM;Left;Supine;5 reps  Quad Sets AROM;Left;5 reps;Supine  Gluteal Sets AROM;Both;5 reps;Standing  Short Arc Quad Supine  PT - End of Session  Activity Tolerance Patient limited by pain  Patient left in bed;with call bell/phone within reach  PT - Assessment/Plan  Comments on Treatment Session Pt reports pain much worse today.  Pt reports being in CPM for 8 consecutive hours last night.  Educated pt on proper use of CPM 2-3 consecutive hours maximum. Pt presents with significant edema in upper anterior thigh, ankle and foot.  Pt has blisters on left upper thigh and ankle.    PT Plan Discharge plan remains appropriate;Frequency remains appropriate   PT Frequency 7X/week  Follow Up Recommendations Home health PT  Equipment Recommended Rolling walker with 5" wheels;3 in 1 bedside comode  Acute Rehab PT Goals  PT Goal Formulation With patient  Time For Goal Achievement 11/28/11  Potential to Achieve Goals Good  Pt will go Supine/Side to Sit with supervision  PT Goal: Supine/Side to Sit - Progress Progressing toward goal  Pt will go Sit to Supine/Side with supervision  PT Goal: Sit to Supine/Side - Progress Progressing toward goal  Pt will go Sit to Stand with supervision  PT Goal: Sit to Stand - Progress Progressing toward goal  Pt will go Stand to Sit with supervision  PT Goal: Stand to Sit - Progress Progressing toward goal  PT General Charges  $$ ACUTE PT VISIT 1 Procedure  PT Treatments  $Therapeutic Exercise 8-22 mins  $Therapeutic Activity 8-22 mins   Avedis Bevis L. Norva Bowe DPT [phone]

## 2011-11-24 DIAGNOSIS — G8918 Other acute postprocedural pain: Secondary | ICD-10-CM | POA: Diagnosis not present

## 2011-11-24 DIAGNOSIS — E119 Type 2 diabetes mellitus without complications: Secondary | ICD-10-CM | POA: Diagnosis not present

## 2011-11-24 DIAGNOSIS — K5900 Constipation, unspecified: Secondary | ICD-10-CM | POA: Diagnosis not present

## 2011-11-24 DIAGNOSIS — M6281 Muscle weakness (generalized): Secondary | ICD-10-CM | POA: Diagnosis not present

## 2011-11-24 DIAGNOSIS — R269 Unspecified abnormalities of gait and mobility: Secondary | ICD-10-CM | POA: Diagnosis not present

## 2011-11-24 DIAGNOSIS — Z471 Aftercare following joint replacement surgery: Secondary | ICD-10-CM | POA: Diagnosis not present

## 2011-11-25 DIAGNOSIS — G8918 Other acute postprocedural pain: Secondary | ICD-10-CM | POA: Diagnosis not present

## 2011-11-25 DIAGNOSIS — M6281 Muscle weakness (generalized): Secondary | ICD-10-CM | POA: Diagnosis not present

## 2011-11-25 DIAGNOSIS — K5900 Constipation, unspecified: Secondary | ICD-10-CM | POA: Diagnosis not present

## 2011-11-25 DIAGNOSIS — E119 Type 2 diabetes mellitus without complications: Secondary | ICD-10-CM | POA: Diagnosis not present

## 2011-11-25 DIAGNOSIS — R269 Unspecified abnormalities of gait and mobility: Secondary | ICD-10-CM | POA: Diagnosis not present

## 2011-11-25 DIAGNOSIS — Z471 Aftercare following joint replacement surgery: Secondary | ICD-10-CM | POA: Diagnosis not present

## 2011-11-27 DIAGNOSIS — R269 Unspecified abnormalities of gait and mobility: Secondary | ICD-10-CM | POA: Diagnosis not present

## 2011-11-27 DIAGNOSIS — M6281 Muscle weakness (generalized): Secondary | ICD-10-CM | POA: Diagnosis not present

## 2011-11-27 DIAGNOSIS — Z471 Aftercare following joint replacement surgery: Secondary | ICD-10-CM | POA: Diagnosis not present

## 2011-11-27 DIAGNOSIS — K5900 Constipation, unspecified: Secondary | ICD-10-CM | POA: Diagnosis not present

## 2011-11-27 DIAGNOSIS — G8918 Other acute postprocedural pain: Secondary | ICD-10-CM | POA: Diagnosis not present

## 2011-11-27 DIAGNOSIS — E119 Type 2 diabetes mellitus without complications: Secondary | ICD-10-CM | POA: Diagnosis not present

## 2011-11-28 DIAGNOSIS — Z471 Aftercare following joint replacement surgery: Secondary | ICD-10-CM | POA: Diagnosis not present

## 2011-11-28 DIAGNOSIS — M6281 Muscle weakness (generalized): Secondary | ICD-10-CM | POA: Diagnosis not present

## 2011-11-28 DIAGNOSIS — E119 Type 2 diabetes mellitus without complications: Secondary | ICD-10-CM | POA: Diagnosis not present

## 2011-11-28 DIAGNOSIS — R269 Unspecified abnormalities of gait and mobility: Secondary | ICD-10-CM | POA: Diagnosis not present

## 2011-11-28 DIAGNOSIS — G8918 Other acute postprocedural pain: Secondary | ICD-10-CM | POA: Diagnosis not present

## 2011-11-28 DIAGNOSIS — K5900 Constipation, unspecified: Secondary | ICD-10-CM | POA: Diagnosis not present

## 2011-11-29 DIAGNOSIS — E119 Type 2 diabetes mellitus without complications: Secondary | ICD-10-CM | POA: Diagnosis not present

## 2011-11-29 DIAGNOSIS — M6281 Muscle weakness (generalized): Secondary | ICD-10-CM | POA: Diagnosis not present

## 2011-11-29 DIAGNOSIS — R269 Unspecified abnormalities of gait and mobility: Secondary | ICD-10-CM | POA: Diagnosis not present

## 2011-11-29 DIAGNOSIS — G8918 Other acute postprocedural pain: Secondary | ICD-10-CM | POA: Diagnosis not present

## 2011-11-29 DIAGNOSIS — Z471 Aftercare following joint replacement surgery: Secondary | ICD-10-CM | POA: Diagnosis not present

## 2011-11-29 DIAGNOSIS — K5900 Constipation, unspecified: Secondary | ICD-10-CM | POA: Diagnosis not present

## 2011-11-30 ENCOUNTER — Other Ambulatory Visit: Payer: Medicare Other

## 2011-12-01 DIAGNOSIS — G8918 Other acute postprocedural pain: Secondary | ICD-10-CM | POA: Diagnosis not present

## 2011-12-01 DIAGNOSIS — M6281 Muscle weakness (generalized): Secondary | ICD-10-CM | POA: Diagnosis not present

## 2011-12-01 DIAGNOSIS — K5900 Constipation, unspecified: Secondary | ICD-10-CM | POA: Diagnosis not present

## 2011-12-01 DIAGNOSIS — Z471 Aftercare following joint replacement surgery: Secondary | ICD-10-CM | POA: Diagnosis not present

## 2011-12-01 DIAGNOSIS — E119 Type 2 diabetes mellitus without complications: Secondary | ICD-10-CM | POA: Diagnosis not present

## 2011-12-01 DIAGNOSIS — R269 Unspecified abnormalities of gait and mobility: Secondary | ICD-10-CM | POA: Diagnosis not present

## 2011-12-04 DIAGNOSIS — M6281 Muscle weakness (generalized): Secondary | ICD-10-CM | POA: Diagnosis not present

## 2011-12-04 DIAGNOSIS — G8918 Other acute postprocedural pain: Secondary | ICD-10-CM | POA: Diagnosis not present

## 2011-12-04 DIAGNOSIS — E119 Type 2 diabetes mellitus without complications: Secondary | ICD-10-CM | POA: Diagnosis not present

## 2011-12-04 DIAGNOSIS — K5900 Constipation, unspecified: Secondary | ICD-10-CM | POA: Diagnosis not present

## 2011-12-04 DIAGNOSIS — R269 Unspecified abnormalities of gait and mobility: Secondary | ICD-10-CM | POA: Diagnosis not present

## 2011-12-04 DIAGNOSIS — Z471 Aftercare following joint replacement surgery: Secondary | ICD-10-CM | POA: Diagnosis not present

## 2011-12-05 DIAGNOSIS — M1710 Unilateral primary osteoarthritis, unspecified knee: Secondary | ICD-10-CM | POA: Diagnosis not present

## 2011-12-05 DIAGNOSIS — M25569 Pain in unspecified knee: Secondary | ICD-10-CM | POA: Diagnosis not present

## 2011-12-06 DIAGNOSIS — R269 Unspecified abnormalities of gait and mobility: Secondary | ICD-10-CM | POA: Diagnosis not present

## 2011-12-06 DIAGNOSIS — Z471 Aftercare following joint replacement surgery: Secondary | ICD-10-CM | POA: Diagnosis not present

## 2011-12-06 DIAGNOSIS — K5900 Constipation, unspecified: Secondary | ICD-10-CM | POA: Diagnosis not present

## 2011-12-06 DIAGNOSIS — M6281 Muscle weakness (generalized): Secondary | ICD-10-CM | POA: Diagnosis not present

## 2011-12-06 DIAGNOSIS — E119 Type 2 diabetes mellitus without complications: Secondary | ICD-10-CM | POA: Diagnosis not present

## 2011-12-06 DIAGNOSIS — G8918 Other acute postprocedural pain: Secondary | ICD-10-CM | POA: Diagnosis not present

## 2011-12-07 ENCOUNTER — Encounter: Payer: Self-pay | Admitting: Family Medicine

## 2011-12-07 ENCOUNTER — Ambulatory Visit (INDEPENDENT_AMBULATORY_CARE_PROVIDER_SITE_OTHER): Payer: Medicare Other | Admitting: Family Medicine

## 2011-12-07 VITALS — BP 122/66 | HR 96 | Temp 98.2°F | Wt 205.8 lb

## 2011-12-07 DIAGNOSIS — Z0000 Encounter for general adult medical examination without abnormal findings: Secondary | ICD-10-CM | POA: Diagnosis not present

## 2011-12-07 DIAGNOSIS — E119 Type 2 diabetes mellitus without complications: Secondary | ICD-10-CM

## 2011-12-07 DIAGNOSIS — I10 Essential (primary) hypertension: Secondary | ICD-10-CM | POA: Diagnosis not present

## 2011-12-07 DIAGNOSIS — E785 Hyperlipidemia, unspecified: Secondary | ICD-10-CM

## 2011-12-07 DIAGNOSIS — Z1211 Encounter for screening for malignant neoplasm of colon: Secondary | ICD-10-CM

## 2011-12-07 DIAGNOSIS — Z125 Encounter for screening for malignant neoplasm of prostate: Secondary | ICD-10-CM | POA: Diagnosis not present

## 2011-12-07 DIAGNOSIS — D649 Anemia, unspecified: Secondary | ICD-10-CM

## 2011-12-07 DIAGNOSIS — Z23 Encounter for immunization: Secondary | ICD-10-CM | POA: Diagnosis not present

## 2011-12-07 LAB — CBC WITH DIFFERENTIAL/PLATELET
Basophils Absolute: 0 10*3/uL (ref 0.0–0.1)
Basophils Relative: 0.4 % (ref 0.0–3.0)
Eosinophils Absolute: 0.1 10*3/uL (ref 0.0–0.7)
Lymphocytes Relative: 18.3 % (ref 12.0–46.0)
MCHC: 32.6 g/dL (ref 30.0–36.0)
Monocytes Relative: 9.3 % (ref 3.0–12.0)
Neutrophils Relative %: 71 % (ref 43.0–77.0)
RBC: 3.4 Mil/uL — ABNORMAL LOW (ref 4.22–5.81)

## 2011-12-07 LAB — LIPID PANEL
Cholesterol: 108 mg/dL (ref 0–200)
LDL Cholesterol: 48 mg/dL (ref 0–99)
Total CHOL/HDL Ratio: 4
VLDL: 33.2 mg/dL (ref 0.0–40.0)

## 2011-12-07 NOTE — Assessment & Plan Note (Signed)
Diet and exercise disrupted with knee surgery.  Continue current meds and recheck in months.  He agrees.  Prev A1c discussed.

## 2011-12-07 NOTE — Assessment & Plan Note (Signed)
Routine anticipatory guidance given to patient. See health maintenance.  PSA pending.  D/w patient [id number] for colon cancer screening, including IFOB vs. colonoscopy. Risks and benefits of both were discussed and patient voiced understanding. Pt elects for: IFOB.  Tetanus today.  Flu shot done.  Eye exam- last done 6 months ago  Living will d/w pt. He has the paperwork.

## 2011-12-07 NOTE — Assessment & Plan Note (Signed)
Controlled, continue current meds.   

## 2011-12-07 NOTE — Patient Instructions (Addendum)
Check with your insurance to see if they will cover the shingles shot. Recheck in 6 months with nonfasting labs ahead of time.  We'll contact you with your lab report. Take care.  Good luck with PT.  Glad to see you.

## 2011-12-07 NOTE — Assessment & Plan Note (Signed)
Continue current meds.  See notes on labs when resulted.

## 2011-12-07 NOTE — Progress Notes (Signed)
CPE- See plan.  Routine anticipatory guidance given to patient.  See health maintenance. PSA pending.   D/w patient [id number] for colon cancer screening, including IFOB vs. colonoscopy.  Risks and benefits of both were discussed and patient voiced understanding.  Pt elects for: IFOB.   Tetanus today.   Flu shot done.  Eye exam- last done 6 months ago Living will d/w pt.  He has the paperwork.    Diabetes:  Using medications without difficulties: yes Hypoglycemic episodes:no Hyperglycemic episodes:no Feet problems:no Blood Sugars averaging: 97 last night before supper, 130 just before bed.   eye exam within last year: yes in last 6 months.  A1c 7.5 recently.    Hypertension:    Using medication without problems or lightheadedness: y Chest pain with exertion:n Edema:n Short of breath:n  Elevated Cholesterol: Using medications without problems:yes Muscle aches: no Diet compliance:yes Exercise: limited by recent knee preplacements  Recently with L knee replaced.  Doing well.  Pain controlled.  Has had ortho f/u.  Was transfused postop for anemia.   PMH and SH reviewed  Meds, vitals, and allergies reviewed.   ROS: See HPI.  Otherwise negative.    GEN: nad, alert and oriented HEENT: mucous membranes moist NECK: supple w/o LA CV: rrr. PULM: ctab, no inc wob ABD: soft, +bs EXT: no edema SKIN: no acute rash L knee with healing incision w/o erythema.  Has 110 deg of flexion   Diabetic foot exam: Normal inspection No skin breakdown No calluses  Normal DP pulses Normal sensation to light touch and monofilament Nails thickened

## 2011-12-07 NOTE — Assessment & Plan Note (Signed)
Recheck CBC today.  See notes on labs. 

## 2011-12-07 NOTE — Addendum Note (Signed)
Addended by: Annamarie Major on: 12/07/2011 09:58 AM   Modules accepted: Orders

## 2011-12-08 ENCOUNTER — Encounter: Payer: Self-pay | Admitting: *Deleted

## 2011-12-11 NOTE — Discharge Summary (Signed)
Physician Discharge Summary  Patient ID: Anthony Cuevas MRN: [id number] DOB/AGE: 1950/05/26 61 y.o.  Admit date: 11/20/2011 Discharge date: 11/22/2011  Admission Diagnoses:  Osteoarthritis of left knee  Discharge Diagnoses:  Principal Problem:  *Osteoarthritis of left knee   Past Medical History  Diagnosis Date  . Diabetes mellitus, type 2 09/1996  . Hyperlipidemia 1992  . Hypertension 05/2003  . Neck pain     chronic  . Smoker   . Depression     improved on sertraline  . Muscle atrophy     in arms from degenerative changes in neck  . GERD (gastroesophageal reflux disease) 1990    Surgeries: Procedure(s): COMPUTER ASSISTED TOTAL KNEE ARTHROPLASTY on 11/20/2011   Consultants (if any):  none  Discharged Condition: Improved  Hospital Course: Anthony Cuevas is an 61 y.o. male who was admitted 11/20/2011 with a diagnosis of Osteoarthritis of left knee and went to the operating room on 11/20/2011 and underwent the above named procedures.    He was given perioperative antibiotics:  Anti-infectives     Start     Dose/Rate Route Frequency Ordered Stop   11/20/11 1700   ceFAZolin (ANCEF) IVPB 2 g/50 mL premix        2 g 100 mL/hr over 30 Minutes Intravenous Every 6 hours 11/20/11 1555 11/20/11 2237   11/19/11 1403   ceFAZolin (ANCEF) IVPB 2 g/50 mL premix        2 g 100 mL/hr over 30 Minutes Intravenous 60 min pre-op 11/19/11 1403 11/20/11 1051        .  He was given sequential compression devices, early ambulation, and Aspirin for DVT prophylaxis. Pt developed edema and blistering of the left calf and distal incision which was treated conservatively with dressing changes and no sign of infection. He benefited maximally from the hospital stay and there were no complications.    Recent vital signs:  Filed Vitals:   11/22/11 1600  BP:   Pulse:   Temp:   Resp: 18    Recent laboratory studies:  Lab Results  Component Value Date   HGB 10.4* 12/07/2011   HGB  9.7* 11/22/2011   HGB 10.7* 11/21/2011   Lab Results  Component Value Date   WBC 9.6 12/07/2011   PLT 440.0* 12/07/2011   Lab Results  Component Value Date   INR 1.13 11/07/2011   Lab Results  Component Value Date   NA 138 11/21/2011   K 4.4 11/21/2011   CL 101 11/21/2011   CO2 25 11/21/2011   BUN 19 11/21/2011   CREATININE 0.99 11/21/2011   GLUCOSE 156* 11/21/2011    Discharge Medications:     Medication List     As of 12/11/2011 12:56 PM    TAKE these medications         aspirin 325 MG EC tablet   Take 1 tablet (325 mg total) by mouth daily with breakfast.      BD INSULIN SYRINGE ULTRAFINE 31G X 5/16" 1 ML Misc   Generic drug: Insulin Syringe-Needle U-100   Use to inject 75 units daily.      celecoxib 200 MG capsule   Commonly known as: CELEBREX   Take 1 capsule (200 mg total) by mouth daily.      cholecalciferol 1000 UNITS tablet   Commonly known as: VITAMIN D   Take 5,000 Units by mouth once a week. On monday      cyclobenzaprine 10 MG tablet   Commonly known as: FLEXERIL  Take 5-10 mg by mouth 3 (three) times daily as needed. Take 5 mg (0.5 tablet) twice daily and take 10 mg (1 tablet) at bedtime as needed for muscle spasms      fish oil-omega-3 fatty acids 1000 MG capsule   Take 1 capsule (1 g total) by mouth daily.      glipiZIDE 2.5 MG 24 hr tablet   Commonly known as: GLUCOTROL XL   Take 1 tablet (2.5 mg total) by mouth daily.      insulin glargine 100 UNIT/ML injection   Commonly known as: LANTUS   Inject 75 Units into the skin at bedtime. Send 7 vials= 10ml each= per md      lisinopril 20 MG tablet   Commonly known as: PRINIVIL,ZESTRIL   Take 1 tablet (20 mg total) by mouth daily.      metFORMIN 1000 MG tablet   Commonly known as: GLUCOPHAGE   Take 1 tablet (1,000 mg total) by mouth 2 (two) times daily with a meal.      niacin 500 MG CR tablet   Commonly known as: NIASPAN   Take 1 tablet (500 mg total) by mouth at bedtime.       pioglitazone 45 MG tablet   Commonly known as: ACTOS   Take 1 tablet (45 mg total) by mouth daily.      pravastatin 80 MG tablet   Commonly known as: PRAVACHOL   Take 1 tablet (80 mg total) by mouth daily.      sertraline 100 MG tablet   Commonly known as: ZOLOFT   Take 1 tablet (100 mg total) by mouth daily.        Diagnostic Studies: No results found.  Disposition: 01-Home or Self Care      Discharge Orders    Future Appointments: Provider: Department: Dept Phone: Center:   05/30/2012 9:00 AM [facility] [place] [phone] [facility]   06/05/2012 2:30 PM Joaquim Nam, MD [facility] [phone] [facility]     Future Orders Please Complete By Expires   Diet - low sodium heart healthy      Call MD / Call 911      Comments:   If you experience chest pain or shortness of breath, CALL 911 and be transported to the hospital emergency room.  If you develope a fever above 101 F, pus (white drainage) or increased drainage or redness at the wound, or calf pain, call your surgeon's office.   Constipation Prevention      Comments:   Drink plenty of fluids.  Prune juice may be helpful.  You may use a stool softener, such as Colace (over the counter) 100 mg twice a day.  Use MiraLax (over the counter) for constipation as needed.   Increase activity slowly as tolerated      Do not put a pillow under the knee. Place it under the heel.         Follow-up Information    Follow up with Eldred Manges, MD. Schedule an appointment as soon as possible for a visit in 2 weeks.   Contact information:   [address] [place] [address] [phone]           Signed: Wende Neighbors 12/11/2011, 12:56 PM

## 2011-12-12 DIAGNOSIS — Z96659 Presence of unspecified artificial knee joint: Secondary | ICD-10-CM | POA: Diagnosis not present

## 2011-12-12 DIAGNOSIS — M1710 Unilateral primary osteoarthritis, unspecified knee: Secondary | ICD-10-CM | POA: Diagnosis not present

## 2011-12-12 DIAGNOSIS — M25569 Pain in unspecified knee: Secondary | ICD-10-CM | POA: Diagnosis not present

## 2011-12-14 ENCOUNTER — Other Ambulatory Visit (INDEPENDENT_AMBULATORY_CARE_PROVIDER_SITE_OTHER): Payer: Medicare Other

## 2011-12-14 DIAGNOSIS — Z1211 Encounter for screening for malignant neoplasm of colon: Secondary | ICD-10-CM | POA: Diagnosis not present

## 2011-12-14 LAB — FECAL OCCULT BLOOD, IMMUNOCHEMICAL: Fecal Occult Bld: NEGATIVE

## 2011-12-15 ENCOUNTER — Encounter: Payer: Self-pay | Admitting: *Deleted

## 2011-12-19 DIAGNOSIS — Z96659 Presence of unspecified artificial knee joint: Secondary | ICD-10-CM | POA: Diagnosis not present

## 2011-12-19 DIAGNOSIS — M25569 Pain in unspecified knee: Secondary | ICD-10-CM | POA: Diagnosis not present

## 2011-12-19 DIAGNOSIS — M1710 Unilateral primary osteoarthritis, unspecified knee: Secondary | ICD-10-CM | POA: Diagnosis not present

## 2011-12-22 DIAGNOSIS — M25569 Pain in unspecified knee: Secondary | ICD-10-CM | POA: Diagnosis not present

## 2011-12-22 DIAGNOSIS — Z96659 Presence of unspecified artificial knee joint: Secondary | ICD-10-CM | POA: Diagnosis not present

## 2011-12-22 DIAGNOSIS — M1710 Unilateral primary osteoarthritis, unspecified knee: Secondary | ICD-10-CM | POA: Diagnosis not present

## 2011-12-27 DIAGNOSIS — Z96659 Presence of unspecified artificial knee joint: Secondary | ICD-10-CM | POA: Diagnosis not present

## 2011-12-27 DIAGNOSIS — M25569 Pain in unspecified knee: Secondary | ICD-10-CM | POA: Diagnosis not present

## 2011-12-27 DIAGNOSIS — M1710 Unilateral primary osteoarthritis, unspecified knee: Secondary | ICD-10-CM | POA: Diagnosis not present

## 2012-01-01 DIAGNOSIS — M25569 Pain in unspecified knee: Secondary | ICD-10-CM | POA: Diagnosis not present

## 2012-01-01 DIAGNOSIS — Z96659 Presence of unspecified artificial knee joint: Secondary | ICD-10-CM | POA: Diagnosis not present

## 2012-01-01 DIAGNOSIS — M1710 Unilateral primary osteoarthritis, unspecified knee: Secondary | ICD-10-CM | POA: Diagnosis not present

## 2012-01-02 DIAGNOSIS — M1710 Unilateral primary osteoarthritis, unspecified knee: Secondary | ICD-10-CM | POA: Diagnosis not present

## 2012-01-02 DIAGNOSIS — M25569 Pain in unspecified knee: Secondary | ICD-10-CM | POA: Diagnosis not present

## 2012-01-04 DIAGNOSIS — Z96659 Presence of unspecified artificial knee joint: Secondary | ICD-10-CM | POA: Diagnosis not present

## 2012-01-04 DIAGNOSIS — M25569 Pain in unspecified knee: Secondary | ICD-10-CM | POA: Diagnosis not present

## 2012-01-04 DIAGNOSIS — M1710 Unilateral primary osteoarthritis, unspecified knee: Secondary | ICD-10-CM | POA: Diagnosis not present

## 2012-01-05 DIAGNOSIS — H4011X0 Primary open-angle glaucoma, stage unspecified: Secondary | ICD-10-CM | POA: Diagnosis not present

## 2012-01-11 DIAGNOSIS — M1710 Unilateral primary osteoarthritis, unspecified knee: Secondary | ICD-10-CM | POA: Diagnosis not present

## 2012-01-11 DIAGNOSIS — M25569 Pain in unspecified knee: Secondary | ICD-10-CM | POA: Diagnosis not present

## 2012-01-11 DIAGNOSIS — Z96659 Presence of unspecified artificial knee joint: Secondary | ICD-10-CM | POA: Diagnosis not present

## 2012-01-18 DIAGNOSIS — M1710 Unilateral primary osteoarthritis, unspecified knee: Secondary | ICD-10-CM | POA: Diagnosis not present

## 2012-01-18 DIAGNOSIS — M25569 Pain in unspecified knee: Secondary | ICD-10-CM | POA: Diagnosis not present

## 2012-01-18 DIAGNOSIS — Z96659 Presence of unspecified artificial knee joint: Secondary | ICD-10-CM | POA: Diagnosis not present

## 2012-01-29 DIAGNOSIS — Z96659 Presence of unspecified artificial knee joint: Secondary | ICD-10-CM | POA: Diagnosis not present

## 2012-01-29 DIAGNOSIS — M25569 Pain in unspecified knee: Secondary | ICD-10-CM | POA: Diagnosis not present

## 2012-01-29 DIAGNOSIS — M1710 Unilateral primary osteoarthritis, unspecified knee: Secondary | ICD-10-CM | POA: Diagnosis not present

## 2012-02-06 DIAGNOSIS — M25569 Pain in unspecified knee: Secondary | ICD-10-CM | POA: Diagnosis not present

## 2012-02-06 DIAGNOSIS — Z96659 Presence of unspecified artificial knee joint: Secondary | ICD-10-CM | POA: Diagnosis not present

## 2012-02-06 DIAGNOSIS — M1710 Unilateral primary osteoarthritis, unspecified knee: Secondary | ICD-10-CM | POA: Diagnosis not present

## 2012-02-08 DIAGNOSIS — Z96659 Presence of unspecified artificial knee joint: Secondary | ICD-10-CM | POA: Diagnosis not present

## 2012-02-08 DIAGNOSIS — M1710 Unilateral primary osteoarthritis, unspecified knee: Secondary | ICD-10-CM | POA: Diagnosis not present

## 2012-02-08 DIAGNOSIS — M25569 Pain in unspecified knee: Secondary | ICD-10-CM | POA: Diagnosis not present

## 2012-02-13 DIAGNOSIS — Z96659 Presence of unspecified artificial knee joint: Secondary | ICD-10-CM | POA: Diagnosis not present

## 2012-02-13 DIAGNOSIS — M25569 Pain in unspecified knee: Secondary | ICD-10-CM | POA: Diagnosis not present

## 2012-02-13 DIAGNOSIS — M1710 Unilateral primary osteoarthritis, unspecified knee: Secondary | ICD-10-CM | POA: Diagnosis not present

## 2012-02-15 DIAGNOSIS — Z96659 Presence of unspecified artificial knee joint: Secondary | ICD-10-CM | POA: Diagnosis not present

## 2012-02-15 DIAGNOSIS — M1710 Unilateral primary osteoarthritis, unspecified knee: Secondary | ICD-10-CM | POA: Diagnosis not present

## 2012-02-15 DIAGNOSIS — M25569 Pain in unspecified knee: Secondary | ICD-10-CM | POA: Diagnosis not present

## 2012-03-06 DIAGNOSIS — E119 Type 2 diabetes mellitus without complications: Secondary | ICD-10-CM | POA: Diagnosis not present

## 2012-04-18 ENCOUNTER — Other Ambulatory Visit: Payer: Self-pay | Admitting: Family Medicine

## 2012-04-26 DIAGNOSIS — M5137 Other intervertebral disc degeneration, lumbosacral region: Secondary | ICD-10-CM | POA: Diagnosis not present

## 2012-04-26 DIAGNOSIS — M1710 Unilateral primary osteoarthritis, unspecified knee: Secondary | ICD-10-CM | POA: Diagnosis not present

## 2012-04-29 ENCOUNTER — Other Ambulatory Visit: Payer: Self-pay | Admitting: Family Medicine

## 2012-04-29 NOTE — Telephone Encounter (Signed)
Electronic refill request.  Please advise. 

## 2012-04-30 NOTE — Telephone Encounter (Signed)
Sent!

## 2012-05-07 ENCOUNTER — Other Ambulatory Visit: Payer: Self-pay | Admitting: Family Medicine

## 2012-05-07 NOTE — Telephone Encounter (Signed)
Cannot find previous refills for this medication in the past 2 years.

## 2012-05-08 NOTE — Telephone Encounter (Signed)
Has been on this.  Sent.

## 2012-05-10 ENCOUNTER — Other Ambulatory Visit: Payer: Self-pay | Admitting: Family Medicine

## 2012-05-18 ENCOUNTER — Other Ambulatory Visit: Payer: Self-pay | Admitting: Family Medicine

## 2012-05-20 NOTE — Telephone Encounter (Signed)
Electronic refill request.  Please advise. 

## 2012-05-20 NOTE — Telephone Encounter (Signed)
Sent!

## 2012-05-21 ENCOUNTER — Other Ambulatory Visit: Payer: Self-pay

## 2012-05-21 MED ORDER — "INSULIN SYRINGE-NEEDLE U-100 31G X 5/16"" 1 ML MISC": Status: DC

## 2012-05-21 NOTE — Telephone Encounter (Signed)
Pt has appt scheduled 06/05/12; pt request refill insulin syringe U 100; 31 Gx5/16" 1 ml to exsress script. Pt advised refilled and pt will keep appt.

## 2012-05-26 ENCOUNTER — Other Ambulatory Visit: Payer: Self-pay | Admitting: Family Medicine

## 2012-05-26 DIAGNOSIS — E119 Type 2 diabetes mellitus without complications: Secondary | ICD-10-CM

## 2012-05-26 DIAGNOSIS — D649 Anemia, unspecified: Secondary | ICD-10-CM

## 2012-05-30 ENCOUNTER — Other Ambulatory Visit (INDEPENDENT_AMBULATORY_CARE_PROVIDER_SITE_OTHER): Payer: Medicare Other

## 2012-05-30 DIAGNOSIS — E119 Type 2 diabetes mellitus without complications: Secondary | ICD-10-CM

## 2012-05-30 DIAGNOSIS — R972 Elevated prostate specific antigen [PSA]: Secondary | ICD-10-CM | POA: Diagnosis not present

## 2012-05-30 DIAGNOSIS — D649 Anemia, unspecified: Secondary | ICD-10-CM | POA: Diagnosis not present

## 2012-05-30 LAB — CBC WITH DIFFERENTIAL/PLATELET
Basophils Relative: 0.4 % (ref 0.0–3.0)
Eosinophils Relative: 1.9 % (ref 0.0–5.0)
HCT: 40.1 % (ref 39.0–52.0)
Hemoglobin: 13.8 g/dL (ref 13.0–17.0)
Lymphs Abs: 2.8 10*3/uL (ref 0.7–4.0)
MCV: 83.1 fl (ref 78.0–100.0)
Monocytes Absolute: 1.1 10*3/uL — ABNORMAL HIGH (ref 0.1–1.0)
Neutro Abs: 6 10*3/uL (ref 1.4–7.7)
RBC: 4.82 Mil/uL (ref 4.22–5.81)
WBC: 10.1 10*3/uL (ref 4.5–10.5)

## 2012-06-05 ENCOUNTER — Encounter: Payer: Self-pay | Admitting: Family Medicine

## 2012-06-05 ENCOUNTER — Ambulatory Visit (INDEPENDENT_AMBULATORY_CARE_PROVIDER_SITE_OTHER): Payer: Medicare Other | Admitting: Family Medicine

## 2012-06-05 VITALS — BP 118/70 | HR 84 | Temp 98.6°F | Wt 207.5 lb

## 2012-06-05 DIAGNOSIS — M5090 Cervical disc disorder, unspecified, unspecified cervical region: Secondary | ICD-10-CM

## 2012-06-05 DIAGNOSIS — H4011X0 Primary open-angle glaucoma, stage unspecified: Secondary | ICD-10-CM | POA: Diagnosis not present

## 2012-06-05 DIAGNOSIS — E119 Type 2 diabetes mellitus without complications: Secondary | ICD-10-CM

## 2012-06-05 MED ORDER — HYDROCODONE-ACETAMINOPHEN 5-325 MG PO TABS: 1.0000 | ORAL_TABLET | Freq: Every day | ORAL | Status: DC | PRN

## 2012-06-05 NOTE — Assessment & Plan Note (Signed)
A1c up.  He'll work on diet.  This looks to be diet nonadherence, not lack of med usage.  He'll fill out grid with sugar pre/post meals and I'll check this.  He agrees.  Recheck A1c in 3 months.

## 2012-06-05 NOTE — Patient Instructions (Addendum)
Keep working on your diet and recheck in 3 months- labs and then a visit.  Take care.

## 2012-06-05 NOTE — Progress Notes (Signed)
R knee prev replaced. Postop anemia normalized now.   He continue to have neck pain, usually taking flexeril and occ hydrocodone (max 1 a day for the neck pain) with some relief.  He had run out prev.  No ADE from the medicine.   Diabetes:  Using medications without difficulties: he had missed a few doses, but A1c up >11 now.   Hypoglycemic episodes: no Hyperglycemic episodes:yes Feet problems: no Blood Sugars averaging: fasting 150 this AM, had been higher recently.   Labs discussed.   His diet had been off.  He's been working more on his diet recently. He loves mashed potatoes. Discussed.    PMH and SH reviewed  Meds, vitals, and allergies reviewed.   ROS: See HPI.  Otherwise negative.    GEN: nad, alert and oriented HEENT: mucous membranes moist, neck supple.  NECK: supple w/o LA CV: rrr. PULM: ctab, no inc wob ABD: soft, +bs EXT: no edema SKIN: no acute rash  Diabetic foot exam: Normal inspection No skin breakdown Calluses  Noted on B great toes Normal DP pulses Normal sensation to light touch and monofilament Nails normal

## 2012-06-05 NOTE — Assessment & Plan Note (Signed)
Continues to have neck pain, usually taking flexeril and occ hydrocodone (max 1 a day for the neck pain) with some relief.  He had run out prev.  No ADE from the medicine. Will continue as is.

## 2012-08-08 ENCOUNTER — Other Ambulatory Visit: Payer: Self-pay

## 2012-08-10 ENCOUNTER — Other Ambulatory Visit: Payer: Self-pay | Admitting: Family Medicine

## 2012-08-12 MED ORDER — VARENICLINE TARTRATE 0.5 MG X 11 & 1 MG X 42 PO MISC: ORAL | Status: AC

## 2012-08-12 NOTE — Telephone Encounter (Signed)
Sent!

## 2012-08-12 NOTE — Telephone Encounter (Signed)
Electronic refill request. Please advise. ? Quantities.

## 2012-09-05 ENCOUNTER — Other Ambulatory Visit (INDEPENDENT_AMBULATORY_CARE_PROVIDER_SITE_OTHER): Payer: Medicare Other

## 2012-09-05 DIAGNOSIS — E119 Type 2 diabetes mellitus without complications: Secondary | ICD-10-CM | POA: Diagnosis not present

## 2012-09-05 DIAGNOSIS — H4011X0 Primary open-angle glaucoma, stage unspecified: Secondary | ICD-10-CM | POA: Diagnosis not present

## 2012-09-09 ENCOUNTER — Ambulatory Visit: Payer: Medicare Other | Admitting: Family Medicine

## 2012-09-25 DIAGNOSIS — D234 Other benign neoplasm of skin of scalp and neck: Secondary | ICD-10-CM | POA: Diagnosis not present

## 2012-09-25 DIAGNOSIS — L280 Lichen simplex chronicus: Secondary | ICD-10-CM | POA: Diagnosis not present

## 2012-09-25 DIAGNOSIS — Z85820 Personal history of malignant melanoma of skin: Secondary | ICD-10-CM | POA: Diagnosis not present

## 2012-09-25 DIAGNOSIS — D2370 Other benign neoplasm of skin of unspecified lower limb, including hip: Secondary | ICD-10-CM | POA: Diagnosis not present

## 2012-09-25 DIAGNOSIS — Z85828 Personal history of other malignant neoplasm of skin: Secondary | ICD-10-CM | POA: Diagnosis not present

## 2012-09-28 ENCOUNTER — Other Ambulatory Visit: Payer: Self-pay | Admitting: Family Medicine

## 2012-10-02 ENCOUNTER — Telehealth: Payer: Self-pay | Admitting: *Deleted

## 2012-10-02 NOTE — Telephone Encounter (Signed)
Form for diabetic testing supplies in your IN box 

## 2012-10-02 NOTE — Telephone Encounter (Signed)
Done

## 2012-10-03 NOTE — Telephone Encounter (Signed)
Form faxed

## 2012-10-06 ENCOUNTER — Other Ambulatory Visit: Payer: Self-pay | Admitting: Family Medicine

## 2012-10-20 ENCOUNTER — Other Ambulatory Visit: Payer: Self-pay | Admitting: Family Medicine

## 2012-10-22 ENCOUNTER — Other Ambulatory Visit: Payer: Self-pay | Admitting: Family Medicine

## 2012-10-22 NOTE — Telephone Encounter (Signed)
Electronic refill request.  Please advise. 

## 2012-10-22 NOTE — Telephone Encounter (Signed)
Sent!

## 2012-11-20 ENCOUNTER — Other Ambulatory Visit: Payer: Self-pay | Admitting: Family Medicine

## 2012-11-20 DIAGNOSIS — E119 Type 2 diabetes mellitus without complications: Secondary | ICD-10-CM

## 2012-11-20 DIAGNOSIS — E1059 Type 1 diabetes mellitus with other circulatory complications: Secondary | ICD-10-CM

## 2012-11-29 ENCOUNTER — Other Ambulatory Visit (INDEPENDENT_AMBULATORY_CARE_PROVIDER_SITE_OTHER): Payer: Medicare Other

## 2012-11-29 DIAGNOSIS — E119 Type 2 diabetes mellitus without complications: Secondary | ICD-10-CM | POA: Diagnosis not present

## 2012-12-04 DIAGNOSIS — E119 Type 2 diabetes mellitus without complications: Secondary | ICD-10-CM | POA: Diagnosis not present

## 2012-12-06 ENCOUNTER — Encounter: Payer: Self-pay | Admitting: Family Medicine

## 2012-12-06 ENCOUNTER — Ambulatory Visit (INDEPENDENT_AMBULATORY_CARE_PROVIDER_SITE_OTHER): Payer: Medicare Other | Admitting: Family Medicine

## 2012-12-06 VITALS — BP 122/62 | HR 72 | Temp 97.3°F | Wt 217.0 lb

## 2012-12-06 DIAGNOSIS — Z23 Encounter for immunization: Secondary | ICD-10-CM | POA: Diagnosis not present

## 2012-12-06 DIAGNOSIS — E119 Type 2 diabetes mellitus without complications: Secondary | ICD-10-CM

## 2012-12-06 MED ORDER — HYDROCODONE-ACETAMINOPHEN 5-325 MG PO TABS: 1.0000 | ORAL_TABLET | Freq: Every day | ORAL | Status: DC | PRN

## 2012-12-06 NOTE — Progress Notes (Signed)
Pre-visit discussion using our clinic review tool. No additional management support is needed unless otherwise documented below in the visit note.  Diabetes:  Using medications without difficulties:yes Hypoglycemic episodes:no Hyperglycemic episodes:yes Blood Sugars averaging: >200 after meals.  A1c d/w pt.   PMH and SH noted.   Meds, vitals, and allergies reviewed.   ROS: See HPI.  Otherwise negative.    GEN: nad, alert and oriented HEENT: mucous membranes moist NECK: supple w/o LA CV: rrr. PULM: ctab, no inc wob ABD: soft, +bs EXT: no edema SKIN: no acute rash

## 2012-12-06 NOTE — Patient Instructions (Signed)
Call ortho about follow up.  Fill out the grid with your sugar readings and send it to me.  We'll go from there.  Plan on recheck in about 3 months with labs ahead of time, but we'll be in touch in the meantime.

## 2012-12-09 ENCOUNTER — Encounter: Payer: Self-pay | Admitting: Family Medicine

## 2012-12-09 MED ORDER — PRAVASTATIN SODIUM 80 MG PO TABS: ORAL_TABLET | ORAL | Status: DC

## 2012-12-09 MED ORDER — CELECOXIB 200 MG PO CAPS: ORAL_CAPSULE | ORAL | Status: DC

## 2012-12-09 MED ORDER — PIOGLITAZONE HCL 45 MG PO TABS: ORAL_TABLET | ORAL | Status: DC

## 2012-12-09 MED ORDER — INSULIN GLARGINE 100 UNIT/ML ~~LOC~~ SOLN: SUBCUTANEOUS | Status: DC

## 2012-12-09 MED ORDER — LISINOPRIL 20 MG PO TABS: ORAL_TABLET | ORAL | Status: DC

## 2012-12-09 MED ORDER — METFORMIN HCL 1000 MG PO TABS: ORAL_TABLET | ORAL | Status: DC

## 2012-12-09 MED ORDER — SERTRALINE HCL 100 MG PO TABS: ORAL_TABLET | ORAL | Status: DC

## 2012-12-09 MED ORDER — NIACIN ER (ANTIHYPERLIPIDEMIC) 500 MG PO TBCR: EXTENDED_RELEASE_TABLET | ORAL | Status: DC

## 2012-12-09 MED ORDER — CYCLOBENZAPRINE HCL 10 MG PO TABS: ORAL_TABLET | ORAL | Status: DC

## 2012-12-09 MED ORDER — GLIPIZIDE ER 2.5 MG PO TB24: 2.5000 mg | ORAL_TABLET | Freq: Every day | ORAL | Status: DC

## 2012-12-09 NOTE — Assessment & Plan Note (Signed)
He'll bring/send in list of pre/post meal sugars and we can assess need for mealtime insulin.  D/w pt about weight and diet.  Okay for outpatient f/u.  F/u A1c d/w pt.

## 2012-12-25 ENCOUNTER — Other Ambulatory Visit: Payer: Self-pay | Admitting: Family Medicine

## 2013-03-03 ENCOUNTER — Other Ambulatory Visit (INDEPENDENT_AMBULATORY_CARE_PROVIDER_SITE_OTHER): Payer: Medicare Other

## 2013-03-03 DIAGNOSIS — E119 Type 2 diabetes mellitus without complications: Secondary | ICD-10-CM

## 2013-03-03 LAB — HEMOGLOBIN A1C: Hgb A1c MFr Bld: 9 % — ABNORMAL HIGH (ref 4.6–6.5)

## 2013-03-07 ENCOUNTER — Other Ambulatory Visit: Payer: Self-pay | Admitting: Family Medicine

## 2013-03-10 ENCOUNTER — Ambulatory Visit: Payer: Medicare Other | Admitting: Family Medicine

## 2013-03-11 DIAGNOSIS — H4011X0 Primary open-angle glaucoma, stage unspecified: Secondary | ICD-10-CM | POA: Diagnosis not present

## 2013-03-13 ENCOUNTER — Ambulatory Visit (INDEPENDENT_AMBULATORY_CARE_PROVIDER_SITE_OTHER): Payer: Medicare Other | Admitting: Family Medicine

## 2013-03-13 ENCOUNTER — Encounter: Payer: Self-pay | Admitting: Family Medicine

## 2013-03-13 VITALS — BP 126/72 | HR 94 | Temp 98.1°F | Wt 218.2 lb

## 2013-03-13 DIAGNOSIS — IMO0002 Reserved for concepts with insufficient information to code with codable children: Secondary | ICD-10-CM

## 2013-03-13 DIAGNOSIS — IMO0001 Reserved for inherently not codable concepts without codable children: Secondary | ICD-10-CM | POA: Diagnosis not present

## 2013-03-13 DIAGNOSIS — E1165 Type 2 diabetes mellitus with hyperglycemia: Secondary | ICD-10-CM

## 2013-03-13 MED ORDER — "INSULIN SYRINGE-NEEDLE U-100 31G X 5/16"" 1 ML MISC": Status: DC

## 2013-03-13 MED ORDER — GLUCOSE BLOOD VI STRP: ORAL_STRIP | Status: DC

## 2013-03-13 NOTE — Progress Notes (Signed)
Pre-visit discussion using our clinic review tool. No additional management support is needed unless otherwise documented below in the visit note.  Diabetes:  Using medications without difficulties:yes Hypoglycemic episodes:no Hyperglycemic episodes:occ Feet problems: no Blood Sugars averaging:see below 90-150 in AM usually now.   A1c down to 9.    Meds, vitals, and allergies reviewed.   ROS: See HPI.  Otherwise negative.    GEN: nad, alert and oriented HEENT: mucous membranes moist NECK: supple w/o LA CV: rrr. PULM: ctab, no inc wob ABD: soft, +bs EXT: no edema SKIN: no acute rash  Diabetic foot exam: No skin breakdown but irritated/macerated skin b/t toes notes No calluses  Normal DP pulses Normal sensation to light touch and monofilament Nails thickened

## 2013-03-13 NOTE — Patient Instructions (Signed)
Schedule a physical for about 3-4 months from now.  Fill out the sugar grid in the meantime.  Take care.  Keep working on [employer].   Glad to see you.

## 2013-03-14 ENCOUNTER — Telehealth: Payer: Self-pay

## 2013-03-14 NOTE — Telephone Encounter (Signed)
Relevant patient education assigned to patient using Emmi. ° °

## 2013-03-14 NOTE — Assessment & Plan Note (Signed)
D/w pt about diet and meds.  Will grid out pre/post sugars and drop off for me. We'll go from there.  D/w pt about keeping feet dry, foot care.  He agrees.

## 2013-04-03 ENCOUNTER — Telehealth: Payer: Self-pay | Admitting: Family Medicine

## 2013-04-03 MED ORDER — INSULIN ASPART 100 UNIT/ML ~~LOC~~ SOLN: 5.0000 [IU] | Freq: Three times a day (TID) | SUBCUTANEOUS | Status: DC

## 2013-04-03 NOTE — Telephone Encounter (Signed)
Notify pt. Sugars reviewed. He's going to need mealtime insulin.  Start with 5 units at a meal, checking sugars before and 2 hours after meals, like he recently did.  Drop off a copy of sugars and we'll go from there. rx sent. He can use his current syringes with the new insulin.  Continue as is with his lantus.  Please send in more syringes if needed.  Thanks.

## 2013-04-03 NOTE — Telephone Encounter (Signed)
Patient advised. Patient says he does not need any syringes right now but will contact the pharmacy when he does.

## 2013-04-07 ENCOUNTER — Other Ambulatory Visit: Payer: Self-pay | Admitting: *Deleted

## 2013-04-07 MED ORDER — GLUCOSE BLOOD VI STRP: ORAL_STRIP | Status: DC

## 2013-04-07 NOTE — Telephone Encounter (Signed)
Received faxed refill request from pharmacy. Refill sent to pharmacy electronically. 

## 2013-04-08 DIAGNOSIS — H4011X0 Primary open-angle glaucoma, stage unspecified: Secondary | ICD-10-CM | POA: Diagnosis not present

## 2013-04-11 ENCOUNTER — Telehealth: Payer: Self-pay | Admitting: *Deleted

## 2013-04-11 NOTE — Telephone Encounter (Signed)
Received from for diabetic shoes from [facility].  Patient would like to get these.  Form in your In Box.

## 2013-04-12 NOTE — Telephone Encounter (Signed)
Form done. Thanks. 

## 2013-04-14 NOTE — Telephone Encounter (Signed)
Form faxed and scanned.

## 2013-04-25 DIAGNOSIS — M25569 Pain in unspecified knee: Secondary | ICD-10-CM | POA: Diagnosis not present

## 2013-04-30 ENCOUNTER — Other Ambulatory Visit: Payer: Self-pay | Admitting: Family Medicine

## 2013-04-30 ENCOUNTER — Telehealth: Payer: Self-pay | Admitting: Family Medicine

## 2013-04-30 MED ORDER — INSULIN ASPART 100 UNIT/ML ~~LOC~~ SOLN: 5.0000 [IU] | Freq: Three times a day (TID) | SUBCUTANEOUS | Status: DC

## 2013-04-30 NOTE — Telephone Encounter (Signed)
Please call pt.  I checked his sugar grid.  Thanks.  No change in lantus. Plan on giving same dose of mealtime insulin (5 units) if normal meal and pre meal sugar is <200.  If sugar >200 before meals, give 10 units. If very large/high carb meal, then give an extra 5 units. Max 15 units/meal.  He'll take either 5, 10 or 15 units with meals.  Have him drop off some sugar readings in about 1-2 weeks.  Plan on recheck in may as scheduled.   Thanks.

## 2013-04-30 NOTE — Telephone Encounter (Signed)
Patient notified as instructed by telephone. Patient stated that he understood the instructions clearly and will drop off sugar readings in 1-2 weeks.

## 2013-05-16 ENCOUNTER — Other Ambulatory Visit: Payer: Self-pay | Admitting: Family Medicine

## 2013-05-18 ENCOUNTER — Other Ambulatory Visit: Payer: Self-pay | Admitting: Family Medicine

## 2013-05-19 NOTE — Telephone Encounter (Signed)
Electronic refill request. Last filled:  90 capsules 0RF on 03/07/2013.  Please advise.

## 2013-05-19 NOTE — Telephone Encounter (Signed)
Sent!

## 2013-05-22 ENCOUNTER — Other Ambulatory Visit: Payer: Self-pay | Admitting: Family Medicine

## 2013-05-22 DIAGNOSIS — E1165 Type 2 diabetes mellitus with hyperglycemia: Secondary | ICD-10-CM

## 2013-05-22 DIAGNOSIS — IMO0002 Reserved for concepts with insufficient information to code with codable children: Secondary | ICD-10-CM

## 2013-06-05 ENCOUNTER — Other Ambulatory Visit (INDEPENDENT_AMBULATORY_CARE_PROVIDER_SITE_OTHER): Payer: Medicare Other

## 2013-06-05 DIAGNOSIS — IMO0001 Reserved for inherently not codable concepts without codable children: Secondary | ICD-10-CM

## 2013-06-05 DIAGNOSIS — Z0000 Encounter for general adult medical examination without abnormal findings: Secondary | ICD-10-CM

## 2013-06-05 DIAGNOSIS — I10 Essential (primary) hypertension: Secondary | ICD-10-CM

## 2013-06-05 DIAGNOSIS — E785 Hyperlipidemia, unspecified: Secondary | ICD-10-CM

## 2013-06-05 DIAGNOSIS — IMO0002 Reserved for concepts with insufficient information to code with codable children: Secondary | ICD-10-CM

## 2013-06-05 DIAGNOSIS — E1165 Type 2 diabetes mellitus with hyperglycemia: Secondary | ICD-10-CM

## 2013-06-05 LAB — COMPREHENSIVE METABOLIC PANEL
ALT: 44 U/L (ref 0–53)
AST: 41 U/L — ABNORMAL HIGH (ref 0–37)
Albumin: 4.1 g/dL (ref 3.5–5.2)
Alkaline Phosphatase: 60 U/L (ref 39–117)
BILIRUBIN TOTAL: 0.5 mg/dL (ref 0.2–1.2)
BUN: 21 mg/dL (ref 6–23)
CO2: 26 meq/L (ref 19–32)
CREATININE: 1.2 mg/dL (ref 0.4–1.5)
Calcium: 9.3 mg/dL (ref 8.4–10.5)
Chloride: 101 mEq/L (ref 96–112)
GFR: 65.96 mL/min (ref >60.00)
Glucose, Bld: 137 mg/dL — ABNORMAL HIGH (ref 70–99)
Potassium: 4.4 mEq/L (ref 3.5–5.1)
Sodium: 135 mEq/L (ref 135–145)
Total Protein: 7.6 g/dL (ref 6.0–8.3)

## 2013-06-05 LAB — HEMOGLOBIN A1C: Hgb A1c MFr Bld: 9.2 % — ABNORMAL HIGH (ref 4.6–6.5)

## 2013-06-05 LAB — LIPID PANEL
CHOLESTEROL: 136 mg/dL (ref 0–200)
HDL: 36.9 mg/dL — AB (ref >39.00)
LDL Cholesterol: 65 mg/dL (ref 0–99)
TRIGLYCERIDES: 170 mg/dL — AB (ref 0.0–149.0)
Total CHOL/HDL Ratio: 4
VLDL: 34 mg/dL (ref 0.0–40.0)

## 2013-06-12 ENCOUNTER — Ambulatory Visit (INDEPENDENT_AMBULATORY_CARE_PROVIDER_SITE_OTHER): Payer: Medicare Other | Admitting: Family Medicine

## 2013-06-12 ENCOUNTER — Encounter: Payer: Self-pay | Admitting: Family Medicine

## 2013-06-12 VITALS — BP 130/76 | HR 97 | Temp 97.7°F | Ht 69.0 in | Wt 217.2 lb

## 2013-06-12 DIAGNOSIS — M5090 Cervical disc disorder, unspecified, unspecified cervical region: Secondary | ICD-10-CM

## 2013-06-12 DIAGNOSIS — Z1211 Encounter for screening for malignant neoplasm of colon: Secondary | ICD-10-CM

## 2013-06-12 DIAGNOSIS — Z23 Encounter for immunization: Secondary | ICD-10-CM | POA: Diagnosis not present

## 2013-06-12 DIAGNOSIS — E785 Hyperlipidemia, unspecified: Secondary | ICD-10-CM | POA: Diagnosis not present

## 2013-06-12 DIAGNOSIS — Z0000 Encounter for general adult medical examination without abnormal findings: Secondary | ICD-10-CM | POA: Diagnosis not present

## 2013-06-12 DIAGNOSIS — IMO0001 Reserved for inherently not codable concepts without codable children: Secondary | ICD-10-CM

## 2013-06-12 DIAGNOSIS — I10 Essential (primary) hypertension: Secondary | ICD-10-CM | POA: Diagnosis not present

## 2013-06-12 DIAGNOSIS — IMO0002 Reserved for concepts with insufficient information to code with codable children: Secondary | ICD-10-CM

## 2013-06-12 DIAGNOSIS — E1165 Type 2 diabetes mellitus with hyperglycemia: Secondary | ICD-10-CM

## 2013-06-12 DIAGNOSIS — E119 Type 2 diabetes mellitus without complications: Secondary | ICD-10-CM

## 2013-06-12 MED ORDER — HYDROCODONE-ACETAMINOPHEN 5-325 MG PO TABS: 1.0000 | ORAL_TABLET | Freq: Every day | ORAL | Status: DC | PRN

## 2013-06-12 NOTE — Patient Instructions (Addendum)
Check with your insurance to see if they will cover the shingles shot. Stop the pravastatin for now and see if the aches get better.  Let me know one way or the other.  Call dermatology about the spot on your neck.   Take care.  Glad to see you.  Recheck A1c in about 3 months before a visit.

## 2013-06-12 NOTE — Progress Notes (Signed)
Pre visit review using our clinic review tool, if applicable. No additional management support is needed unless otherwise documented below in the visit note.  I have personally reviewed the Medicare Annual Wellness questionnaire and have noted 1. The patient's medical and social history 2. Their use of alcohol, tobacco or illicit drugs 3. Their current medications and supplements 4. The patient's functional ability including ADL's, fall risks, home safety risks and hearing or visual             impairment. 5. Diet and physical activities 6. Evidence for depression or mood disorders  The patients weight, height, BMI have been recorded in the chart and visual acuity is per eye clinic.  I have made referrals, counseling and provided education to the patient based review of the above and I have provided the pt with a written personalized care plan for preventive services.  See scanned forms.  Routine anticipatory guidance given to patient.  See health maintenance. Flu 2014 Shingles d/w pt. PNA 2015, done today.  Tetanus 2013 D/w patient [id number] for colon cancer screening, including IFOB vs. colonoscopy.  Risks and benefits of both were discussed and patient voiced understanding.  Pt elects SWN:IOEV Prostate cancer screening and PSA options (with potential risks and benefits of testing vs not testing) were discussed along with recent recs/guidelines.  He declined testing PSA at this point. Advance directive- wife designated if patient were incapacitated.  Cognitive function addressed- see scanned forms- and if abnormal then additional documentation follows.   Knee and neck pain continue.  This isn't a new issue, had used prn hydrocodone in past with relief w/o ADE.  Needs a refill.   Diabetes:  Using medications without difficulties: yes Hypoglycemic episodes:no Hyperglycemic episodes:no Feet problems:no Blood Sugars averaging: much improved with recent insulin changes.  See scanned  forms. A1c likely lags his progress, d/w pt.  eye exam within last year:yes  Hypertension:    Using medication without problems or lightheadedness: occ lightheadedness, if working in the heat.  Chest pain with exertion:no Edema:no Short of breath:no  Elevated Cholesterol: Using medications without problems:yes, except for cramping.  Muscle aches: yes Diet compliance:yes Exercise:yes, d/w pt.   PMH and SH reviewed.   Vital signs, Meds and allergies reviewed.  ROS: See HPI.  Otherwise nontributory.   GEN: nad, alert and oriented HEENT: mucous membranes moist NECK: supple w/o LA CV: rrr.  PULM: ctab, no inc wob ABD: soft, +bs EXT: no edema SKIN: no acute rash but irritated papule noted on the posterior neck (he'll f/u with derm, d/w pt) L knee with chronic arthritic changes noted.  No erythema.    Diabetic foot exam: Normal inspection No skin breakdown No calluses  Normal DP pulses Normal sensation to light tough and monofilament Nails normal

## 2013-06-13 NOTE — Assessment & Plan Note (Signed)
Continue prn hydrocodone for this and for his L knee pain.  No ADE. He agres.

## 2013-06-13 NOTE — Assessment & Plan Note (Signed)
Trial off statin given the aches and he'll report back. He agrees.

## 2013-06-13 NOTE — Assessment & Plan Note (Signed)
Likely A1c lag, continue as is.  D/w pt about diet and exercise.

## 2013-06-13 NOTE — Assessment & Plan Note (Signed)
See scanned forms.  Routine anticipatory guidance given to patient.  See health maintenance. Flu 2014 Shingles d/w pt. PNA 2015, done today.  Tetanus 2013 D/w patient [id number] for colon cancer screening, including IFOB vs. colonoscopy.  Risks and benefits of both were discussed and patient voiced understanding.  Pt elects WGY:KZLD Prostate cancer screening and PSA options (with potential risks and benefits of testing vs not testing) were discussed along with recent recs/guidelines.  He declined testing PSA at this point. Advance directive- wife designated if patient were incapacitated.  Cognitive function addressed- see scanned forms- and if abnormal then additional documentation follows.

## 2013-06-13 NOTE — Assessment & Plan Note (Signed)
Controlled, continue as is.  He agrees. Labs d/w pt.

## 2013-06-19 ENCOUNTER — Other Ambulatory Visit (INDEPENDENT_AMBULATORY_CARE_PROVIDER_SITE_OTHER): Payer: Medicare Other

## 2013-06-19 DIAGNOSIS — Z1211 Encounter for screening for malignant neoplasm of colon: Secondary | ICD-10-CM

## 2013-06-19 LAB — FECAL OCCULT BLOOD, IMMUNOCHEMICAL: Fecal Occult Bld: NEGATIVE

## 2013-06-20 ENCOUNTER — Encounter: Payer: Self-pay | Admitting: *Deleted

## 2013-07-08 DIAGNOSIS — H4011X0 Primary open-angle glaucoma, stage unspecified: Secondary | ICD-10-CM | POA: Diagnosis not present

## 2013-07-11 ENCOUNTER — Other Ambulatory Visit: Payer: Self-pay | Admitting: Family Medicine

## 2013-09-05 ENCOUNTER — Other Ambulatory Visit (INDEPENDENT_AMBULATORY_CARE_PROVIDER_SITE_OTHER): Payer: Medicare Other

## 2013-09-05 DIAGNOSIS — E119 Type 2 diabetes mellitus without complications: Secondary | ICD-10-CM

## 2013-09-05 DIAGNOSIS — E138 Other specified diabetes mellitus with unspecified complications: Secondary | ICD-10-CM | POA: Diagnosis not present

## 2013-09-05 DIAGNOSIS — IMO0001 Reserved for inherently not codable concepts without codable children: Secondary | ICD-10-CM | POA: Diagnosis not present

## 2013-09-05 DIAGNOSIS — E1165 Type 2 diabetes mellitus with hyperglycemia: Secondary | ICD-10-CM

## 2013-09-05 DIAGNOSIS — E088 Diabetes mellitus due to underlying condition with unspecified complications: Secondary | ICD-10-CM

## 2013-09-05 LAB — HEMOGLOBIN A1C: HEMOGLOBIN A1C: 8 % — AB (ref 4.6–6.5)

## 2013-09-12 ENCOUNTER — Ambulatory Visit (INDEPENDENT_AMBULATORY_CARE_PROVIDER_SITE_OTHER): Payer: Medicare Other | Admitting: Family Medicine

## 2013-09-12 ENCOUNTER — Encounter: Payer: Self-pay | Admitting: Family Medicine

## 2013-09-12 VITALS — BP 112/62 | HR 97 | Temp 97.9°F | Wt 217.2 lb

## 2013-09-12 DIAGNOSIS — IMO0001 Reserved for inherently not codable concepts without codable children: Secondary | ICD-10-CM

## 2013-09-12 DIAGNOSIS — E1165 Type 2 diabetes mellitus with hyperglycemia: Secondary | ICD-10-CM

## 2013-09-12 DIAGNOSIS — E119 Type 2 diabetes mellitus without complications: Secondary | ICD-10-CM | POA: Diagnosis not present

## 2013-09-12 DIAGNOSIS — IMO0002 Reserved for concepts with insufficient information to code with codable children: Secondary | ICD-10-CM

## 2013-09-12 MED ORDER — HYDROCODONE-ACETAMINOPHEN 5-325 MG PO TABS: 1.0000 | ORAL_TABLET | Freq: Every day | ORAL | Status: DC | PRN

## 2013-09-12 NOTE — Assessment & Plan Note (Signed)
Still uncontrolled with A1c 8 but improved, he'll work on diet and we elected not to change meds/insulin scale.  Recheck in about 3 months.  He feels better.  I thanked him for his effort.

## 2013-09-12 NOTE — Patient Instructions (Signed)
Keep working on your diet and recheck A1c in about 3 months before a visit.   Thanks for your effort.   Glad to see you.

## 2013-09-12 NOTE — Progress Notes (Signed)
Pre visit review using our clinic review tool, if applicable. No additional management support is needed unless otherwise documented below in the visit note.  Usually 90-150 before breakfast and usually 100-200 before lunch and dinner, depending on breakfast size.  He is adjusting his mealtime insulin, having to take 15 units less often.  Still using same scale. Working on getting 3 meals a day.  A1c is the lowest it has been in years, 8.  D/w pt.  Weight stable.  Sugar log reviewed.  He is working on portion control, no seconds.    We agreed to work on meals more than med changes and recheck in about 3 months.  He is exercising and feels better.    His hand pain continues.  Has been using vicodin ~1 a day with some relief. occ needing extra 1/2 tab.  No ADE.  Refill done.    Meds, vitals, and allergies reviewed.   ROS: See HPI.  Otherwise negative.    GEN: nad, alert and oriented HEENT: mucous membranes moist NECK: supple w/o LA CV: rrr. PULM: ctab, no inc wob ABD: soft, +bs EXT: no edema SKIN: no acute rash

## 2013-09-13 ENCOUNTER — Other Ambulatory Visit: Payer: Self-pay | Admitting: Family Medicine

## 2013-10-08 DIAGNOSIS — H4011X0 Primary open-angle glaucoma, stage unspecified: Secondary | ICD-10-CM | POA: Diagnosis not present

## 2013-10-10 ENCOUNTER — Other Ambulatory Visit: Payer: Self-pay | Admitting: Family Medicine

## 2013-10-10 NOTE — Telephone Encounter (Signed)
Last filled 05/19/13, ok to refill?

## 2013-10-12 NOTE — Telephone Encounter (Signed)
Sent!

## 2013-10-27 ENCOUNTER — Other Ambulatory Visit: Payer: Self-pay | Admitting: Family Medicine

## 2013-10-28 ENCOUNTER — Other Ambulatory Visit: Payer: Self-pay | Admitting: Family Medicine

## 2013-11-05 ENCOUNTER — Ambulatory Visit: Payer: Medicare Other

## 2013-11-08 ENCOUNTER — Other Ambulatory Visit: Payer: Self-pay | Admitting: Family Medicine

## 2013-12-04 ENCOUNTER — Other Ambulatory Visit: Payer: Self-pay | Admitting: Family Medicine

## 2013-12-08 ENCOUNTER — Other Ambulatory Visit (INDEPENDENT_AMBULATORY_CARE_PROVIDER_SITE_OTHER): Payer: Medicare Other

## 2013-12-08 DIAGNOSIS — E119 Type 2 diabetes mellitus without complications: Secondary | ICD-10-CM

## 2013-12-08 LAB — HEMOGLOBIN A1C: HEMOGLOBIN A1C: 8.1 % — AB (ref 4.6–6.5)

## 2013-12-15 ENCOUNTER — Ambulatory Visit (INDEPENDENT_AMBULATORY_CARE_PROVIDER_SITE_OTHER): Payer: Medicare Other | Admitting: Family Medicine

## 2013-12-15 ENCOUNTER — Encounter: Payer: Self-pay | Admitting: Family Medicine

## 2013-12-15 VITALS — BP 112/68 | HR 92 | Temp 98.9°F | Wt 224.2 lb

## 2013-12-15 DIAGNOSIS — E119 Type 2 diabetes mellitus without complications: Secondary | ICD-10-CM

## 2013-12-15 DIAGNOSIS — E1165 Type 2 diabetes mellitus with hyperglycemia: Secondary | ICD-10-CM | POA: Diagnosis not present

## 2013-12-15 DIAGNOSIS — Z23 Encounter for immunization: Secondary | ICD-10-CM

## 2013-12-15 DIAGNOSIS — IMO0002 Reserved for concepts with insufficient information to code with codable children: Secondary | ICD-10-CM

## 2013-12-15 MED ORDER — HYDROCODONE-ACETAMINOPHEN 5-325 MG PO TABS: 1.0000 | ORAL_TABLET | Freq: Every day | ORAL | Status: DC | PRN

## 2013-12-15 NOTE — Patient Instructions (Signed)
Keep working on walking for exercise and recheck A1c in about 4 months before a visit.  Take care.  Glad to see you.

## 2013-12-15 NOTE — Progress Notes (Signed)
Pre visit review using our clinic review tool, if applicable. No additional management support is needed unless otherwise documented below in the visit note.  Diabetes:  Using medications without difficulties:yes Hypoglycemic episodes: no  Hyperglycemic episodes:no Feet problems: no Blood Sugars averaging: usually 90-100 in the AM.   eye exam within last year:yes A1c d/w pt.  Similar to prev.  He's going to walk more this winter and that should help his sugar and weight.  He wanted to work on exercise more than med changes.   L knee pain after replacement, chronic, using hydrocodone occ without ADE.  His L hip pain increases when he overcompensates.   Meds, vitals, and allergies reviewed.   ROS: See HPI.  Otherwise negative.    GEN: nad, alert and oriented HEENT: mucous membranes moist NECK: supple w/o LA CV: rrr. PULM: ctab, no inc wob ABD: soft, +bs EXT: no edema SKIN: no acute rash  Diabetic foot exam: Normal inspection No skin breakdown No calluses  Normal DP pulses Normal sensation to light touch and monofilament Nails thickened.

## 2013-12-16 NOTE — Assessment & Plan Note (Signed)
A1c stable at ~8, he'll work on weight loss with more walking.  That should help.  We can recheck A1c periodically, in about 4 months.   We elected not to change his meds at this point.  He agrees with plan.

## 2014-01-01 ENCOUNTER — Telehealth: Payer: Self-pay | Admitting: Family Medicine

## 2014-01-01 MED ORDER — GLUCOSE BLOOD VI STRP: ORAL_STRIP | Status: DC

## 2014-01-01 NOTE — Telephone Encounter (Signed)
Pt came in today needed to get a [facility]  onetouch ultra blue test

## 2014-01-01 NOTE — Telephone Encounter (Signed)
[facility] sent to [facility]

## 2014-01-02 ENCOUNTER — Other Ambulatory Visit: Payer: Self-pay | Admitting: Family Medicine

## 2014-01-05 ENCOUNTER — Other Ambulatory Visit: Payer: Self-pay | Admitting: *Deleted

## 2014-01-05 ENCOUNTER — Telehealth: Payer: Self-pay | Admitting: *Deleted

## 2014-01-05 NOTE — Telephone Encounter (Signed)
Form for PA for test strips placed in your In Box.  Contacted patient that insurance will cover test strips by FreeStyle Lite and Precision Xtra but patient would like to try PA for the test strips that fit his current device.

## 2014-01-05 NOTE — Telephone Encounter (Signed)
Received refill request electronically from pharmacy. Last refill 12/09/12 #180/3, last office visit 12/15/13. Is it okay to refill medication?

## 2014-01-06 NOTE — Telephone Encounter (Signed)
Sent. Thanks.   

## 2014-01-06 NOTE — Telephone Encounter (Signed)
I'll work on the hard copy.  Thanks.  

## 2014-01-07 DIAGNOSIS — H4011X1 Primary open-angle glaucoma, mild stage: Secondary | ICD-10-CM | POA: Diagnosis not present

## 2014-01-08 ENCOUNTER — Other Ambulatory Visit: Payer: Self-pay | Admitting: Family Medicine

## 2014-01-17 ENCOUNTER — Other Ambulatory Visit: Payer: Self-pay | Admitting: Family Medicine

## 2014-02-03 ENCOUNTER — Other Ambulatory Visit: Payer: Self-pay | Admitting: *Deleted

## 2014-02-03 MED ORDER — GLUCOSE BLOOD VI STRP: ORAL_STRIP | Status: DC

## 2014-02-10 ENCOUNTER — Telehealth: Payer: Self-pay | Admitting: Family Medicine

## 2014-02-10 MED ORDER — FREESTYLE SYSTEM KIT: PACK | Status: DC

## 2014-02-10 NOTE — Telephone Encounter (Signed)
Pt came in requesting a rx for a freestyle meter.  His pharmacy told him that he needs a rx for this.  He left a letter with [place]. On [employer].  MF

## 2014-02-10 NOTE — Telephone Encounter (Signed)
Rx printed and patient advised to pick up Rx.

## 2014-03-05 ENCOUNTER — Other Ambulatory Visit: Payer: Self-pay | Admitting: Family Medicine

## 2014-03-05 NOTE — Telephone Encounter (Signed)
Received refill request electronically from pharmacy. Last refill 10/12/13 #90/1. Last office visit 12/15/13. Is it okay to refill medication?

## 2014-03-05 NOTE — Telephone Encounter (Signed)
Sent. Thanks.   

## 2014-03-19 ENCOUNTER — Other Ambulatory Visit: Payer: Self-pay | Admitting: Family Medicine

## 2014-04-01 ENCOUNTER — Other Ambulatory Visit: Payer: Self-pay | Admitting: Family Medicine

## 2014-04-01 NOTE — Telephone Encounter (Signed)
Spoke to patient and was advised that he is back on the Pravastatin because being off of it did not help his aches.

## 2014-04-07 ENCOUNTER — Telehealth: Payer: Self-pay | Admitting: *Deleted

## 2014-04-07 NOTE — Telephone Encounter (Signed)
Received paperwork from company regarding a back brace for patient. Sent letter back to company stating that request needs to come from the patient at an office visit.

## 2014-04-08 ENCOUNTER — Other Ambulatory Visit (INDEPENDENT_AMBULATORY_CARE_PROVIDER_SITE_OTHER): Payer: Medicare Other

## 2014-04-08 DIAGNOSIS — E119 Type 2 diabetes mellitus without complications: Secondary | ICD-10-CM

## 2014-04-08 LAB — HEMOGLOBIN A1C: Hgb A1c MFr Bld: 8.2 % — ABNORMAL HIGH (ref 4.6–6.5)

## 2014-04-09 DIAGNOSIS — H4011X1 Primary open-angle glaucoma, mild stage: Secondary | ICD-10-CM | POA: Diagnosis not present

## 2014-04-10 LAB — HM DIABETES EYE EXAM

## 2014-04-15 ENCOUNTER — Encounter: Payer: Self-pay | Admitting: Family Medicine

## 2014-04-15 ENCOUNTER — Ambulatory Visit (INDEPENDENT_AMBULATORY_CARE_PROVIDER_SITE_OTHER): Payer: Medicare Other | Admitting: Family Medicine

## 2014-04-15 VITALS — BP 130/70 | HR 96 | Temp 99.0°F | Wt 222.8 lb

## 2014-04-15 DIAGNOSIS — E1165 Type 2 diabetes mellitus with hyperglycemia: Secondary | ICD-10-CM

## 2014-04-15 DIAGNOSIS — M545 Low back pain, unspecified: Secondary | ICD-10-CM

## 2014-04-15 DIAGNOSIS — IMO0002 Reserved for concepts with insufficient information to code with codable children: Secondary | ICD-10-CM

## 2014-04-15 MED ORDER — HYDROCODONE-ACETAMINOPHEN 5-325 MG PO TABS: 1.0000 | ORAL_TABLET | Freq: Every day | ORAL | Status: DC | PRN

## 2014-04-15 MED ORDER — INSULIN ASPART 100 UNIT/ML ~~LOC~~ SOLN: 5.0000 [IU] | Freq: Three times a day (TID) | SUBCUTANEOUS | Status: DC

## 2014-04-15 NOTE — Progress Notes (Signed)
Pre visit review using our clinic review tool, if applicable. No additional management support is needed unless otherwise documented below in the visit note.  DM2 f/u.  97 this Am, 111 after breakfast.  104 before lunch, hasn't checked again today yet.  Compliant with meds.  No lows unless he has prolonged fasting.  His sugar has been improved recently, much better, since started novolog and since getting over a cold.  A1c similar to prev, d/w pt.   Diet is fair, he's eating breakfast now and that is an improvement.   Light lunch.  Supper is the heavier meal of the day, but still low carb.  He is working to cut carbs.   No foot sx except for some dry skin.  Eye exam d/w pt.  Went last week.    Hip pain.  Started about 1 month ago.  L hip pain, and L lower back pain.  Pain walking.  Can throb.  Still on celebrex.  Recently out of hydrocodone, over the last week.  No ADE on med.  He has tried to limit hydrocodone use.  No injury, no trauma.  Tried local heat.  He's been trying to stretch as much as possible.    Meds, vitals, and allergies reviewed.   ROS: See HPI.  Otherwise, noncontributory.  GEN: nad, alert and oriented HEENT: mucous membranes moist NECK: supple w/o LA CV: rrr PULM: ctab, no inc wob ABD: soft, +bs EXT: no edema SKIN: no acute rash L lower back ttp but not ttp in midline.  Pain with twisting.  Also ttp on the L greater troch but no pain on int rotation L hip.  SLR neg.  Able to bear weight.    Diabetic foot exam: Normal inspection No skin breakdown but dry skin noted No calluses  Normal DP pulses Normal sensation to light touch and monofilament Nails thickened

## 2014-04-15 NOTE — Patient Instructions (Signed)
Use ice on your hip and heat on your back.  Gently stretch and use the hydrocodone if needed.  Keep working on your diet but don't change your diabetes meds.  Recheck in about 3 months with labs before a physical.  Glad to see you.  Take care.

## 2014-04-17 DIAGNOSIS — M5416 Radiculopathy, lumbar region: Secondary | ICD-10-CM | POA: Insufficient documentation

## 2014-04-17 NOTE — Assessment & Plan Note (Signed)
At this point, he'll work more on diet.  We are trying to avoid lows.  He agrees.  Recheck in ~3 months.  No change in meds.  Labs d/w pt.

## 2014-04-17 NOTE — Assessment & Plan Note (Signed)
Likely muscle spasm with greater troch bursitis.  D/w pt about stretching, restart hydrocodone, and ice his lateral hip.  He agrees.

## 2014-04-21 ENCOUNTER — Telehealth: Payer: Self-pay | Admitting: *Deleted

## 2014-04-21 NOTE — Telephone Encounter (Signed)
Anthony Cuevas advised that form was just faxed.

## 2014-04-21 NOTE — Telephone Encounter (Signed)
Anthony Cuevas with [employer] left a message stating that they had faxed over an order to be signed for diabetic supplies. Anthony Cuevas requested a call back to let her know that this has been received and will be faxed back. [phone] Ext [phone]

## 2014-05-12 ENCOUNTER — Encounter: Payer: Self-pay | Admitting: Family Medicine

## 2014-05-18 ENCOUNTER — Other Ambulatory Visit: Payer: Self-pay | Admitting: Family Medicine

## 2014-06-18 ENCOUNTER — Other Ambulatory Visit: Payer: Self-pay | Admitting: Family Medicine

## 2014-06-18 NOTE — Telephone Encounter (Signed)
Sent. Thanks.   

## 2014-06-18 NOTE — Telephone Encounter (Signed)
Electronic refill request. Last Filled:    7 vial 6 RF on 07/11/2013  Please advise.

## 2014-06-20 ENCOUNTER — Other Ambulatory Visit: Payer: Self-pay | Admitting: Family Medicine

## 2014-06-29 ENCOUNTER — Other Ambulatory Visit: Payer: Self-pay | Admitting: Family Medicine

## 2014-07-06 ENCOUNTER — Other Ambulatory Visit: Payer: Self-pay | Admitting: Family Medicine

## 2014-07-06 DIAGNOSIS — E1165 Type 2 diabetes mellitus with hyperglycemia: Secondary | ICD-10-CM

## 2014-07-06 DIAGNOSIS — IMO0002 Reserved for concepts with insufficient information to code with codable children: Secondary | ICD-10-CM

## 2014-07-08 DIAGNOSIS — H4011X1 Primary open-angle glaucoma, mild stage: Secondary | ICD-10-CM | POA: Diagnosis not present

## 2014-07-13 ENCOUNTER — Other Ambulatory Visit (INDEPENDENT_AMBULATORY_CARE_PROVIDER_SITE_OTHER): Payer: Medicare Other

## 2014-07-13 DIAGNOSIS — E1165 Type 2 diabetes mellitus with hyperglycemia: Secondary | ICD-10-CM

## 2014-07-13 DIAGNOSIS — IMO0002 Reserved for concepts with insufficient information to code with codable children: Secondary | ICD-10-CM

## 2014-07-13 LAB — COMPREHENSIVE METABOLIC PANEL
ALK PHOS: 47 U/L (ref 39–117)
ALT: 28 U/L (ref 0–53)
AST: 29 U/L (ref 0–37)
Albumin: 4.1 g/dL (ref 3.5–5.2)
BILIRUBIN TOTAL: 0.3 mg/dL (ref 0.2–1.2)
BUN: 23 mg/dL (ref 6–23)
CO2: 25 mEq/L (ref 19–32)
Calcium: 9.1 mg/dL (ref 8.4–10.5)
Chloride: 104 mEq/L (ref 96–112)
Creatinine, Ser: 0.99 mg/dL (ref 0.40–1.50)
GFR: 81.27 mL/min (ref >60.00)
GLUCOSE: 79 mg/dL (ref 70–99)
Potassium: 4.3 mEq/L (ref 3.5–5.1)
Sodium: 135 mEq/L (ref 135–145)
TOTAL PROTEIN: 7.1 g/dL (ref 6.0–8.3)

## 2014-07-13 LAB — LIPID PANEL
Cholesterol: 130 mg/dL (ref 0–200)
HDL: 33 mg/dL — AB (ref >39.00)
LDL Cholesterol: 68 mg/dL (ref 0–99)
NonHDL: 97
TRIGLYCERIDES: 144 mg/dL (ref 0.0–149.0)
Total CHOL/HDL Ratio: 4
VLDL: 28.8 mg/dL (ref 0.0–40.0)

## 2014-07-13 LAB — HEMOGLOBIN A1C: Hgb A1c MFr Bld: 7.7 % — ABNORMAL HIGH (ref 4.6–6.5)

## 2014-07-16 ENCOUNTER — Encounter: Payer: Self-pay | Admitting: Family Medicine

## 2014-07-16 ENCOUNTER — Ambulatory Visit (INDEPENDENT_AMBULATORY_CARE_PROVIDER_SITE_OTHER): Payer: Medicare Other | Admitting: Family Medicine

## 2014-07-16 ENCOUNTER — Encounter (INDEPENDENT_AMBULATORY_CARE_PROVIDER_SITE_OTHER): Payer: Self-pay

## 2014-07-16 VITALS — BP 138/72 | HR 109 | Temp 98.4°F | Ht 69.0 in | Wt 222.5 lb

## 2014-07-16 DIAGNOSIS — E1165 Type 2 diabetes mellitus with hyperglycemia: Secondary | ICD-10-CM | POA: Diagnosis not present

## 2014-07-16 DIAGNOSIS — Z7189 Other specified counseling: Secondary | ICD-10-CM

## 2014-07-16 DIAGNOSIS — Z1211 Encounter for screening for malignant neoplasm of colon: Secondary | ICD-10-CM | POA: Diagnosis not present

## 2014-07-16 DIAGNOSIS — I10 Essential (primary) hypertension: Secondary | ICD-10-CM

## 2014-07-16 DIAGNOSIS — IMO0002 Reserved for concepts with insufficient information to code with codable children: Secondary | ICD-10-CM

## 2014-07-16 DIAGNOSIS — Z0000 Encounter for general adult medical examination without abnormal findings: Secondary | ICD-10-CM | POA: Diagnosis not present

## 2014-07-16 DIAGNOSIS — M5090 Cervical disc disorder, unspecified, unspecified cervical region: Secondary | ICD-10-CM

## 2014-07-16 DIAGNOSIS — E785 Hyperlipidemia, unspecified: Secondary | ICD-10-CM

## 2014-07-16 MED ORDER — HYDROCODONE-ACETAMINOPHEN 5-325 MG PO TABS: 1.0000 | ORAL_TABLET | Freq: Every day | ORAL | Status: DC | PRN

## 2014-07-16 MED ORDER — METFORMIN HCL 1000 MG PO TABS: 1000.0000 mg | ORAL_TABLET | Freq: Two times a day (BID) | ORAL | Status: DC

## 2014-07-16 MED ORDER — CYCLOBENZAPRINE HCL 10 MG PO TABS: 10.0000 mg | ORAL_TABLET | Freq: Two times a day (BID) | ORAL | Status: DC | PRN

## 2014-07-16 MED ORDER — CELECOXIB 200 MG PO CAPS: 200.0000 mg | ORAL_CAPSULE | Freq: Every day | ORAL | Status: DC

## 2014-07-16 MED ORDER — LISINOPRIL 20 MG PO TABS: 20.0000 mg | ORAL_TABLET | Freq: Every day | ORAL | Status: DC

## 2014-07-16 MED ORDER — INSULIN GLARGINE 100 UNIT/ML ~~LOC~~ SOLN: SUBCUTANEOUS | Status: DC

## 2014-07-16 MED ORDER — GLUCOSE BLOOD VI STRP: ORAL_STRIP | Status: DC

## 2014-07-16 MED ORDER — NIACIN ER (ANTIHYPERLIPIDEMIC) 500 MG PO TBCR: 500.0000 mg | EXTENDED_RELEASE_TABLET | Freq: Every day | ORAL | Status: DC

## 2014-07-16 MED ORDER — SERTRALINE HCL 100 MG PO TABS: 100.0000 mg | ORAL_TABLET | Freq: Every day | ORAL | Status: DC

## 2014-07-16 MED ORDER — "INSULIN SYRINGE/NEEDLE 28G X 1/2"" 1 ML MISC": Status: DC

## 2014-07-16 MED ORDER — INSULIN ASPART 100 UNIT/ML ~~LOC~~ SOLN: 5.0000 [IU] | Freq: Three times a day (TID) | SUBCUTANEOUS | Status: DC

## 2014-07-16 MED ORDER — PIOGLITAZONE HCL 45 MG PO TABS: 45.0000 mg | ORAL_TABLET | Freq: Every day | ORAL | Status: DC

## 2014-07-16 MED ORDER — GLIPIZIDE ER 2.5 MG PO TB24: 2.5000 mg | ORAL_TABLET | Freq: Every day | ORAL | Status: DC

## 2014-07-16 MED ORDER — PRAVASTATIN SODIUM 80 MG PO TABS: 80.0000 mg | ORAL_TABLET | Freq: Every day | ORAL | Status: DC

## 2014-07-16 NOTE — Progress Notes (Signed)
Pre visit review using our clinic review tool, if applicable. No additional management support is needed unless otherwise documented below in the visit note.  I have personally reviewed the Medicare Annual Wellness questionnaire and have noted 1. The patient's medical and social history 2. Their use of alcohol, tobacco or illicit drugs 3. Their current medications and supplements 4. The patient's functional ability including ADL's, fall risks, home safety risks and hearing or visual             impairment. 5. Diet and physical activities 6. Evidence for depression or mood disorders  The patients weight, height, BMI have been recorded in the chart and visual acuity is per eye clinic.  I have made referrals, counseling and provided education to the patient based review of the above and I have provided the pt with a written personalized care plan for preventive services.  CPE- See plan.  Routine anticipatory guidance given to patient.  See health maintenance. Tetanus 2013 Flu 2015 PNA 2015 Shingles d/w pt.  D/w patient [id number] for colon cancer screening, including IFOB vs. colonoscopy.  Risks and benefits of both were discussed and patient voiced understanding.  Pt elects DZH:GDJM.  Prostate cancer screening and PSA options (with potential risks and benefits of testing vs not testing) were discussed along with recent recs/guidelines.  He declined testing PSA at this point. Living will d/w pt.  Wife designated if patient were incapacitated.  Diet and exercise d/w pt.  Less carbs, smaller portions.  Exercise limited by hip pain, and he'll likely f/u with ortho about that.  Cognitive function addressed and normal- normal recall, A&O.  No falls.   Mood is good.  Not in need of hearing eval.    Diabetes:  Using medications without difficulties: yes Hypoglycemic episodes:no Hyperglycemic episodes:no Feet problems: occ foot tingling but that is stable and not consistent.  Blood Sugars  averaging: ~120 in AM A1c improved but not to goal, d/w pt.   Hypertension:    Using medication without problems or lightheadedness: yes Chest pain with exertion:no Edema:no Short of breath:no  Elevated Cholesterol: Using medications without problems:yes Muscle aches: not from statin Diet compliance:yes Exercise:see above, limited by hip pain.  TG improved.   Neck pain continues, s/p much surgeries.  Taking vicodin prn with some relief, needs refills.  No ADE on med.  Some relief with med.  No new sx.    B hand and arm pain responds to flexeril, taking once daily, no ADE on med, asking about taking a second dose later in the day if needed.    PMH and SH reviewed.   Vital signs, Meds and allergies reviewed.  ROS: See HPI.  Otherwise nontributory.   GEN: nad, alert and oriented HEENT: mucous membranes moist NECK: supple w/o LA, but pain with ROM of neck CV: rrr.  PULM: ctab, no inc wob ABD: soft, +bs EXT: no edema SKIN: no acute rash Chronic IP joint changes noted on the hands.   Diabetic foot exam: Normal inspection No skin breakdown No calluses  Normal DP pulses Normal sensation to light tough and monofilament Nails normal

## 2014-07-16 NOTE — Patient Instructions (Addendum)
Check with your insurance to see if they will cover the shingles shot. Go to the lab on the way out.  We'll contact you with your lab report. Take care.  Glad to see you.  Recheck A1c in about 4 months before a visit.

## 2014-07-17 DIAGNOSIS — Z7189 Other specified counseling: Secondary | ICD-10-CM | POA: Insufficient documentation

## 2014-07-17 NOTE — Assessment & Plan Note (Signed)
Okay on recheck, continue meds, no ADE on meds.  Continue work on diet and weight.  He agrees.  Labs d/w pt.

## 2014-07-17 NOTE — Assessment & Plan Note (Signed)
Stable, continue statin, no ADE on med.  Continue work on diet and weight.  He agrees.  Labs d/w pt.

## 2014-07-17 NOTE — Assessment & Plan Note (Signed)
Tetanus 2013 Flu 2015 PNA 2015 Shingles d/w pt.  D/w patient TO:[id number] for colon cancer screening, including IFOB vs. colonoscopy.  Risks and benefits of both were discussed and patient voiced understanding.  Pt elects KDX:IPJA.  Prostate cancer screening and PSA options (with potential risks and benefits of testing vs not testing) were discussed along with recent recs/guidelines.  He declined testing PSA at this point. Living will d/w pt.  Wife designated if patient were incapacitated.  Diet and exercise d/w pt.  Less carbs, smaller portions.  Exercise limited by hip pain, and he'll likely f/u with ortho about that.  Cognitive function addressed and normal- normal recall, A&O.  No falls.   Mood is good.  Not in need of hearing eval.

## 2014-07-17 NOTE — Assessment & Plan Note (Signed)
Still uncontrolled but improved, likely with lagging A1c. Continue as is.  Recheck in about 3 months.  A1c should continue to improve with continued diet work.  He agrees.

## 2014-07-17 NOTE — Assessment & Plan Note (Signed)
Continue prn vicodin.  No ADE on med.  He agrees.  The hand pain is likely not related; okay to take 1.5-2 tabs of flexeril a day and update me as needed.  He agrees .

## 2014-07-20 ENCOUNTER — Other Ambulatory Visit: Payer: Self-pay | Admitting: *Deleted

## 2014-07-24 ENCOUNTER — Other Ambulatory Visit (INDEPENDENT_AMBULATORY_CARE_PROVIDER_SITE_OTHER): Payer: Medicare Other

## 2014-07-24 DIAGNOSIS — Z1211 Encounter for screening for malignant neoplasm of colon: Secondary | ICD-10-CM

## 2014-07-24 LAB — FECAL OCCULT BLOOD, IMMUNOCHEMICAL: FECAL OCCULT BLD: NEGATIVE

## 2014-07-27 ENCOUNTER — Encounter: Payer: Self-pay | Admitting: *Deleted

## 2014-08-12 ENCOUNTER — Ambulatory Visit (INDEPENDENT_AMBULATORY_CARE_PROVIDER_SITE_OTHER): Payer: Medicare Other | Admitting: Family Medicine

## 2014-08-12 ENCOUNTER — Encounter: Payer: Self-pay | Admitting: Family Medicine

## 2014-08-12 VITALS — BP 110/60 | HR 100 | Temp 98.4°F | Wt 216.5 lb

## 2014-08-12 DIAGNOSIS — L03031 Cellulitis of right toe: Secondary | ICD-10-CM

## 2014-08-12 DIAGNOSIS — L03039 Cellulitis of unspecified toe: Secondary | ICD-10-CM | POA: Insufficient documentation

## 2014-08-12 MED ORDER — DOXYCYCLINE HYCLATE 100 MG PO TABS: 100.0000 mg | ORAL_TABLET | Freq: Two times a day (BID) | ORAL | Status: DC

## 2014-08-12 NOTE — Patient Instructions (Addendum)
Take doxycycline twice a day.   Update me tomorrow if worse (redder, more pain, more swelling, fever). If not worse tomorrow, then update me Friday (visit scheduled, can cancel if needed).  Elevate foot for now.  Take care.  Glad to see you.

## 2014-08-12 NOTE — Progress Notes (Signed)
Pre visit review using our clinic review tool, if applicable. No additional management support is needed unless otherwise documented below in the visit note.  Sugar has been ~130 in the AM.  Usually has been controlled recently.  No lows.   R foot changes.  4th toe.  Noted a few days ago.  Got swollen initially.  Pain with walking.   No trauma.  No trigger known.  Did have some dry skin on the feet prev.  No h/o gout.  No FCNAVD.  Feels well except for the R 4th toe.   Toe redness noted yesterday, more today.    Has f/u with podiatry pending, but not in the near future.    Meds, vitals, and allergies reviewed.   ROS: See HPI.  Otherwise, noncontributory.  nad R foot with normal DP and inspection except for puffy and red 4th toe.  No fluctuant mass.  No discharge.  Minimally ttp.  Can move the foot w/o pain, but pain with weight bearing.  No ulcer seen on the foot.  He does have some mildly macerated tissue between the 3rd and 4th, and the 4th and 5th toes.

## 2014-08-12 NOTE — Assessment & Plan Note (Signed)
He has no ulceration or systemic sx. Still okay for outpatient f/u.  Will start doxy and have him f/u Friday.  If much improved, can cancel the visit.  If worse tomorrow, then he'll update me.  He agrees.

## 2014-08-14 ENCOUNTER — Ambulatory Visit (INDEPENDENT_AMBULATORY_CARE_PROVIDER_SITE_OTHER): Payer: Medicare Other | Admitting: Family Medicine

## 2014-08-14 ENCOUNTER — Encounter: Payer: Self-pay | Admitting: Family Medicine

## 2014-08-14 VITALS — BP 108/56 | HR 96 | Temp 98.6°F | Wt 219.5 lb

## 2014-08-14 DIAGNOSIS — L03031 Cellulitis of right toe: Secondary | ICD-10-CM

## 2014-08-14 NOTE — Patient Instructions (Addendum)
Call me Monday with an update.  Keep taking the doxycycline in the meantime.  No charge for visit.

## 2014-08-14 NOTE — Progress Notes (Signed)
Pre visit review using our clinic review tool, if applicable. No additional management support is needed unless otherwise documented below in the visit note.  F/u R 4th toe infection.  Tolerating abx w/o ADE.  No fevers.  Foot less puffy, less red, less tender.  Wanted recheck today.  He has images on his phone that document the dec in swelling and erythema, reviewed at [place].   Meds, vitals, and allergies reviewed.   ROS: See HPI.  Otherwise, noncontributory.  nad R foot with intact DP pulse, dec in swelling and redness but still with some puffiness at the 4th toe.   No skin ulceration.

## 2014-08-17 ENCOUNTER — Telehealth: Payer: Self-pay | Admitting: *Deleted

## 2014-08-17 NOTE — Telephone Encounter (Signed)
Patient advised.

## 2014-08-17 NOTE — Telephone Encounter (Signed)
Great, thanks.  Finish the abx and update me as needed.

## 2014-08-17 NOTE — Telephone Encounter (Signed)
Patient was advised to call in with a progress report today.  Patient says his foot is better, no redness now.

## 2014-08-17 NOTE — Assessment & Plan Note (Signed)
Improved, continue abx and update me as needed.  Clearly improved.  He agrees. No charge.

## 2014-09-01 ENCOUNTER — Ambulatory Visit (INDEPENDENT_AMBULATORY_CARE_PROVIDER_SITE_OTHER): Payer: Medicare Other

## 2014-09-01 ENCOUNTER — Ambulatory Visit (INDEPENDENT_AMBULATORY_CARE_PROVIDER_SITE_OTHER): Payer: Medicare Other | Admitting: Podiatry

## 2014-09-01 ENCOUNTER — Encounter: Payer: Self-pay | Admitting: Podiatry

## 2014-09-01 VITALS — BP 152/88 | HR 88 | Resp 16 | Ht 70.0 in | Wt 219.0 lb

## 2014-09-01 DIAGNOSIS — M10071 Idiopathic gout, right ankle and foot: Secondary | ICD-10-CM

## 2014-09-01 DIAGNOSIS — M79676 Pain in unspecified toe(s): Secondary | ICD-10-CM

## 2014-09-01 DIAGNOSIS — L03031 Cellulitis of right toe: Secondary | ICD-10-CM

## 2014-09-01 DIAGNOSIS — L603 Nail dystrophy: Secondary | ICD-10-CM

## 2014-09-01 DIAGNOSIS — B351 Tinea unguium: Secondary | ICD-10-CM | POA: Diagnosis not present

## 2014-09-01 DIAGNOSIS — Q828 Other specified congenital malformations of skin: Secondary | ICD-10-CM

## 2014-09-01 DIAGNOSIS — E119 Type 2 diabetes mellitus without complications: Secondary | ICD-10-CM

## 2014-09-01 DIAGNOSIS — L02611 Cutaneous abscess of right foot: Secondary | ICD-10-CM

## 2014-09-01 LAB — HM DIABETES FOOT EXAM

## 2014-09-01 MED ORDER — DOXYCYCLINE HYCLATE 100 MG PO TABS: 100.0000 mg | ORAL_TABLET | Freq: Two times a day (BID) | ORAL | Status: DC

## 2014-09-01 NOTE — Progress Notes (Signed)
   Subjective:    Patient ID: Anthony Cuevas, male    DOB: 05/11/1950, 64 y.o.   MRN: [id number]  HPI Comments: "I have a swollen toe"  Patient c/o 4th toe right for about 2 weeks. The toe initially was extremely red and swollen. It started to spread onto dorsal foot. No injury. He saw his PCP and he prescribed antibiotics x 2 weeks. He has completed and is better, but still has redness and slight swelling.   Diabetic since 1992 and last A1c was 7.0     Review of Systems  Musculoskeletal: Positive for myalgias, back pain and arthralgias.  All other systems reviewed and are negative.      Objective:   Physical Exam: I have reviewed his past medical history medications allergies surgeries social history and chief complaint. Pulses are palpable bilateral. But slightly diminished. Capillary fill time is somewhat sluggish. The temperature gradient is warm to warm bilateral. Neurologic sensorium is intact per Semmes-Weinstein monofilament. Deep tendon reflexes are intact bilateral and muscle strength +5 over 5 dorsiflexion plantar flexors and inverters everters on his musculature is intact. Orthopedic evaluation does demonstrate an edematous erythematous fourth digit of the right foot is nontender on palpation. He has brought pictures with him demonstrating an very erythematous and swollen foot as well as fourth toe. He has been on doxycycline in the past which reduced the size of the toe considerably. However evaluating it and listening to his history is far as not being able to put weight on it I am concerned that he may have had gout in the past. However covering for cellulitis would be better. Radiographically I see no signs of infection or fracture. His toenails are thick yellow dystrophic onychomycotic and painful palpation. He has multiple [employer] noted bilaterally particularly beneath the fifth metatarsal head of the left foot. No signs of infection.        Assessment &  Plan:  Assessment cellulitis of the fourth digit right foot consider gout upon next visit. Pain in limb secondary to onychomycosis. Porokeratosis left.  Plan: I represcribed his doxycycline today. Follow up with him as needed. Debrided nails 1 through 5 bilateral. Debridement of porokeratotic lesion.

## 2014-09-01 NOTE — Patient Instructions (Signed)
Diabetes and Foot Care Diabetes may cause you to have problems because of poor blood supply (circulation) to your feet and legs. This may cause the skin on your feet to become thinner, break easier, and heal more slowly. Your skin may become dry, and the skin may peel and crack. You may also have nerve damage in your legs and feet causing decreased feeling in them. You may not notice minor injuries to your feet that could lead to infections or more serious problems. Taking care of your feet is one of the most important things you can do for yourself.  HOME CARE INSTRUCTIONS  Wear shoes at all times, even in the house. Do not go barefoot. Bare feet are easily injured.  Check your feet daily for blisters, cuts, and redness. If you cannot see the bottom of your feet, use a mirror or ask someone for help.  Wash your feet with warm water (do not use hot water) and mild soap. Then pat your feet and the areas between your toes until they are completely dry. Do not soak your feet as this can dry your skin.  Apply a moisturizing lotion or petroleum jelly (that does not contain alcohol and is unscented) to the skin on your feet and to dry, brittle toenails. Do not apply lotion between your toes.  Trim your toenails straight across. Do not dig under them or around the cuticle. File the edges of your nails with an emery board or nail file.  Do not cut corns or calluses or try to remove them with medicine.  Wear clean socks or stockings every day. Make sure they are not too tight. Do not wear knee-high stockings since they may decrease blood flow to your legs.  Wear shoes that fit properly and have enough cushioning. To break in new shoes, wear them for just a few hours a day. This prevents you from injuring your feet. Always look in your shoes before you put them on to be sure there are no objects inside.  Do not cross your legs. This may decrease the blood flow to your feet.  If you find a minor scrape,  cut, or break in the skin on your feet, keep it and the skin around it clean and dry. These areas may be cleansed with mild soap and water. Do not cleanse the area with peroxide, alcohol, or iodine.  When you remove an adhesive bandage, be sure not to damage the skin around it.  If you have a wound, look at it several times a day to make sure it is healing.  Do not use heating pads or hot water bottles. They may burn your skin. If you have lost feeling in your feet or legs, you may not know it is happening until it is too late.  Make sure your health care provider performs a complete foot exam at least annually or more often if you have foot problems. Report any cuts, sores, or bruises to your health care provider immediately. SEEK MEDICAL CARE IF:   You have an injury that is not healing.  You have cuts or breaks in the skin.  You have an ingrown nail.  You notice redness on your legs or feet.  You feel burning or tingling in your legs or feet.  You have pain or cramps in your legs and feet.  Your legs or feet are numb.  Your feet always feel cold. SEEK IMMEDIATE MEDICAL CARE IF:   There is increasing redness,   swelling, or pain in or around a wound.  There is a red line that goes up your leg.  Pus is coming from a wound.  You develop a fever or as directed by your health care provider.  You notice a bad smell coming from an ulcer or wound. Document Released: 01/14/2000 Document Revised: 09/18/2012 Document Reviewed: 06/25/2012 ExitCare Patient Information 2015 [employer]. This information is not intended to replace advice given to you by your health care provider. Make sure you discuss any questions you have with your health care provider.  

## 2014-11-07 ENCOUNTER — Other Ambulatory Visit: Payer: Self-pay | Admitting: Family Medicine

## 2014-11-07 DIAGNOSIS — IMO0001 Reserved for inherently not codable concepts without codable children: Secondary | ICD-10-CM

## 2014-11-07 DIAGNOSIS — E1165 Type 2 diabetes mellitus with hyperglycemia: Principal | ICD-10-CM

## 2014-11-12 ENCOUNTER — Other Ambulatory Visit (INDEPENDENT_AMBULATORY_CARE_PROVIDER_SITE_OTHER): Payer: Medicare Other

## 2014-11-12 DIAGNOSIS — IMO0001 Reserved for inherently not codable concepts without codable children: Secondary | ICD-10-CM

## 2014-11-12 DIAGNOSIS — E1165 Type 2 diabetes mellitus with hyperglycemia: Secondary | ICD-10-CM | POA: Diagnosis not present

## 2014-11-12 LAB — HEMOGLOBIN A1C: Hgb A1c MFr Bld: 7.8 % — ABNORMAL HIGH (ref 4.6–6.5)

## 2014-11-17 ENCOUNTER — Ambulatory Visit (INDEPENDENT_AMBULATORY_CARE_PROVIDER_SITE_OTHER): Payer: Medicare Other | Admitting: Family Medicine

## 2014-11-17 ENCOUNTER — Encounter: Payer: Self-pay | Admitting: Family Medicine

## 2014-11-17 VITALS — BP 110/70 | HR 93 | Temp 98.2°F | Wt 223.0 lb

## 2014-11-17 DIAGNOSIS — M5090 Cervical disc disorder, unspecified, unspecified cervical region: Secondary | ICD-10-CM | POA: Diagnosis not present

## 2014-11-17 DIAGNOSIS — E119 Type 2 diabetes mellitus without complications: Secondary | ICD-10-CM | POA: Diagnosis not present

## 2014-11-17 DIAGNOSIS — Z119 Encounter for screening for infectious and parasitic diseases, unspecified: Secondary | ICD-10-CM | POA: Diagnosis not present

## 2014-11-17 DIAGNOSIS — IMO0001 Reserved for inherently not codable concepts without codable children: Secondary | ICD-10-CM

## 2014-11-17 DIAGNOSIS — Z23 Encounter for immunization: Secondary | ICD-10-CM

## 2014-11-17 DIAGNOSIS — E1165 Type 2 diabetes mellitus with hyperglycemia: Secondary | ICD-10-CM

## 2014-11-17 MED ORDER — HYDROCODONE-ACETAMINOPHEN 5-325 MG PO TABS: 1.0000 | ORAL_TABLET | Freq: Every day | ORAL | Status: DC | PRN

## 2014-11-17 NOTE — Progress Notes (Signed)
Pre visit review using our clinic review tool, if applicable. No additional management support is needed unless otherwise documented below in the visit note.  Hand OA and neck pain.  Still on chronic celebrex and taking hydrocodone prn.  No ADE on med.  Needs refill on hydrocodone.    Diabetes:  Using medications without difficulties:yes Hypoglycemic episodes:no Hyperglycemic episodes:no Feet problems:no, prev cellulitis resolved.   Blood Sugars averaging: usually ~140 after drinking coffee in the AM eye exam within last year:yes A1c similar to prev.   He likely needs weight reduction more than med change.   D/w pt about diet and exercise as tolerated.    HIV prev neg in the [employer], ~1990.  Pt opts in for HCV screening.  D/w pt re: routine screening with next set of labs.    Meds, vitals, and allergies reviewed.   ROS: See HPI.  Otherwise negative.    GEN: nad, alert and oriented HEENT: mucous membranes moist, old surgical scars noted.   NECK: supple w/o LA CV: rrr. PULM: ctab, no inc wob ABD: soft, +bs EXT: no edema SKIN: no acute rash

## 2014-11-17 NOTE — Patient Instructions (Signed)
Recheck labs in about 3 months, before a visit.  Work on cutting back the chips.  Keep exercising as you can.  Take care.  Glad to see you.

## 2014-11-18 NOTE — Assessment & Plan Note (Signed)
A1c similar to prev.   He likely needs weight reduction more than med change.   D/w pt about diet and exercise as tolerated.   Recheck A1c in about 3-4 months.

## 2014-11-18 NOTE — Assessment & Plan Note (Signed)
Refilled hydrocodone, used intermittently, no ADE on med.

## 2014-12-15 ENCOUNTER — Encounter: Payer: Self-pay | Admitting: Podiatry

## 2014-12-15 ENCOUNTER — Ambulatory Visit (INDEPENDENT_AMBULATORY_CARE_PROVIDER_SITE_OTHER): Payer: Medicare Other | Admitting: Podiatry

## 2014-12-15 DIAGNOSIS — M79676 Pain in unspecified toe(s): Secondary | ICD-10-CM

## 2014-12-15 DIAGNOSIS — B351 Tinea unguium: Secondary | ICD-10-CM

## 2014-12-15 NOTE — Progress Notes (Signed)
Patient ID: Anthony Cuevas, male   DOB: 1950/10/12, 64 y.o.   MRN: [id number] Complaint:  Visit Type: Patient returns to my office for continued preventative foot care services. Complaint: Patient states" my nails have grown long and thick and become painful to walk and wear shoes" Patient has been diagnosed with DM with no foot complications. The patient presents for preventative foot care services. No changes to ROS  Podiatric Exam: Vascular: dorsalis pedis and posterior tibial pulses are palpable bilateral. Capillary return is immediate. Temperature gradient is WNL. Skin turgor WNL  Sensorium: Normal Semmes Weinstein monofilament test. Normal tactile sensation bilaterally. Nail Exam: Pt has thick disfigured discolored nails with subungual debris noted bilateral entire nail hallux through fifth toenails Ulcer Exam: There is no evidence of ulcer or pre-ulcerative changes or infection. Orthopedic Exam: Muscle tone and strength are WNL. No limitations in general ROM. No crepitus or effusions noted. Foot type and digits show no abnormalities. Bony prominences are unremarkable.HAV B/L Skin: No Porokeratosis. No infection or ulcers  Diagnosis:  Onychomycosis, , Pain in right toe, pain in left toes  Treatment & Plan Procedures and Treatment: Consent by patient was obtained for treatment procedures. The patient understood the discussion of treatment and procedures well. All questions were answered thoroughly reviewed. Debridement of mycotic and hypertrophic toenails, 1 through 5 bilateral and clearing of subungual debris. No ulceration, no infection noted.  Return Visit-Office Procedure: Patient instructed to return to the office for a follow up visit 3 months for continued evaluation and treatment.    Gardiner Barefoot DPM

## 2015-02-09 ENCOUNTER — Other Ambulatory Visit: Payer: Medicare Other

## 2015-02-12 ENCOUNTER — Other Ambulatory Visit (INDEPENDENT_AMBULATORY_CARE_PROVIDER_SITE_OTHER): Payer: Medicare Other

## 2015-02-12 ENCOUNTER — Other Ambulatory Visit: Payer: Self-pay | Admitting: Family Medicine

## 2015-02-12 DIAGNOSIS — E119 Type 2 diabetes mellitus without complications: Secondary | ICD-10-CM

## 2015-02-12 DIAGNOSIS — Z119 Encounter for screening for infectious and parasitic diseases, unspecified: Secondary | ICD-10-CM

## 2015-02-12 LAB — HEMOGLOBIN A1C: HEMOGLOBIN A1C: 8 % — AB (ref 4.6–6.5)

## 2015-02-13 LAB — HEPATITIS C ANTIBODY: HCV Ab: NEGATIVE

## 2015-02-16 ENCOUNTER — Ambulatory Visit: Payer: Medicare Other | Admitting: Family Medicine

## 2015-02-18 ENCOUNTER — Encounter: Payer: Self-pay | Admitting: Family Medicine

## 2015-02-18 ENCOUNTER — Ambulatory Visit (INDEPENDENT_AMBULATORY_CARE_PROVIDER_SITE_OTHER): Payer: Medicare Other | Admitting: Family Medicine

## 2015-02-18 VITALS — BP 116/58 | HR 106 | Temp 98.5°F | Wt 228.2 lb

## 2015-02-18 DIAGNOSIS — IMO0001 Reserved for inherently not codable concepts without codable children: Secondary | ICD-10-CM

## 2015-02-18 DIAGNOSIS — E1165 Type 2 diabetes mellitus with hyperglycemia: Secondary | ICD-10-CM | POA: Diagnosis not present

## 2015-02-18 DIAGNOSIS — Z794 Long term (current) use of insulin: Secondary | ICD-10-CM | POA: Diagnosis not present

## 2015-02-18 DIAGNOSIS — M545 Low back pain: Secondary | ICD-10-CM | POA: Diagnosis not present

## 2015-02-18 MED ORDER — METFORMIN HCL 1000 MG PO TABS: 500.0000 mg | ORAL_TABLET | Freq: Two times a day (BID) | ORAL | Status: DC

## 2015-02-18 MED ORDER — HYDROCODONE-ACETAMINOPHEN 5-325 MG PO TABS: 1.0000 | ORAL_TABLET | Freq: Every day | ORAL | Status: DC | PRN

## 2015-02-18 NOTE — Assessment & Plan Note (Signed)
Worsened with less exercise, due to back pain.  He can cut the metformin for now to see if GI sx get better.  Recheck in about 3 months.  He agrees.   I think that he'll be able to control sugar better after his back pain is addressed.  He agrees.

## 2015-02-18 NOTE — Patient Instructions (Addendum)
I'll await the notes from ortho.   Cut back to 1/2 tab of metformin twice a day.  If better you can try to go back to 1 in the AM and 1/2 in the PM.  Recheck in about 3 months, labs ahead of time.   Take care.  Glad to see you.

## 2015-02-18 NOTE — Progress Notes (Addendum)
Pre visit review using our clinic review tool, if applicable. No additional management support is needed unless otherwise documented below in the visit note.  His L hip pain is getting worse.  He had been taking about ~1 pill/day of hydrocodone, occ 2nd dose.  Seeing ortho next week.  He'll have a burning on the L buttock and that is worse/new.  He can still bear weight but with a lot of pain.  Less pain laying down.  Sitting helps the pain.  Worse with walking.  Can radiate down the leg.  No B/B sx. Hydrocodone helps some with the pain.  rx done at [facility] and given to patient.   Dry flaking skin on the B ears.  Wanted to be checked.   Diabetes:  Using medications without difficulties:yes, except for some occ GI upset that could be from metformin .  Hypoglycemic episodes: no Hyperglycemic episodes: no Feet problems:no Blood Sugars averaging: ~150-190.  He hasn't been walking as much with the hip pain.   eye exam within last year: yes, f/u pending for spring 2017.  A1c d/w pt.  Up, see above re: walking. Diet is fair, he is trying to avoid carbs.    PMH and SH reviewed  Meds, vitals, and allergies reviewed.   ROS: See HPI.  Otherwise negative.    GEN: nad, alert and oriented HEENT: mucous membranes moist, tm wnl B, dry skin w/o ulceration noted on the B pinna (d/w pt, can [place] OTC hand cream and fu prn). NECK: supple w/o LA CV: rrr. PULM: ctab, no inc wob ABD: soft, +bs EXT: no edema SLR neg L leg. S/S grossly wnl BLE

## 2015-02-18 NOTE — Assessment & Plan Note (Signed)
Longstanding, now worse.  SLR neg but the concern is still for sciatica.  Already on nsaid.  Continue vicodin, rx given to patient. I'll await ortho input.  He agrees.

## 2015-02-23 DIAGNOSIS — M25552 Pain in left hip: Secondary | ICD-10-CM | POA: Diagnosis not present

## 2015-02-23 DIAGNOSIS — M25551 Pain in right hip: Secondary | ICD-10-CM | POA: Diagnosis not present

## 2015-02-23 DIAGNOSIS — M545 Low back pain: Secondary | ICD-10-CM | POA: Diagnosis not present

## 2015-02-26 ENCOUNTER — Other Ambulatory Visit (HOSPITAL_COMMUNITY): Payer: Self-pay | Admitting: Orthopaedic Surgery

## 2015-02-26 DIAGNOSIS — M545 Low back pain: Secondary | ICD-10-CM

## 2015-03-11 ENCOUNTER — Ambulatory Visit (HOSPITAL_COMMUNITY)
Admission: RE | Admit: 2015-03-11 | Discharge: 2015-03-11 | Disposition: A | Discharge status: Home / Self Care | Payer: Medicare Other | Source: Ambulatory Visit | Attending: Orthopaedic Surgery | Admitting: Orthopaedic Surgery

## 2015-03-11 DIAGNOSIS — M545 Low back pain: Secondary | ICD-10-CM | POA: Diagnosis not present

## 2015-03-11 DIAGNOSIS — M4806 Spinal stenosis, lumbar region: Secondary | ICD-10-CM | POA: Insufficient documentation

## 2015-03-11 DIAGNOSIS — M79605 Pain in left leg: Secondary | ICD-10-CM | POA: Insufficient documentation

## 2015-03-11 DIAGNOSIS — M79604 Pain in right leg: Secondary | ICD-10-CM | POA: Diagnosis not present

## 2015-03-11 DIAGNOSIS — I739 Peripheral vascular disease, unspecified: Secondary | ICD-10-CM | POA: Diagnosis not present

## 2015-03-11 DIAGNOSIS — M5136 Other intervertebral disc degeneration, lumbar region: Secondary | ICD-10-CM | POA: Diagnosis not present

## 2015-03-19 DIAGNOSIS — M25552 Pain in left hip: Secondary | ICD-10-CM | POA: Diagnosis not present

## 2015-03-19 DIAGNOSIS — M25551 Pain in right hip: Secondary | ICD-10-CM | POA: Diagnosis not present

## 2015-03-19 DIAGNOSIS — M545 Low back pain: Secondary | ICD-10-CM | POA: Diagnosis not present

## 2015-03-23 ENCOUNTER — Ambulatory Visit (INDEPENDENT_AMBULATORY_CARE_PROVIDER_SITE_OTHER): Payer: Medicare Other | Admitting: Podiatry

## 2015-03-23 ENCOUNTER — Encounter: Payer: Self-pay | Admitting: Podiatry

## 2015-03-23 DIAGNOSIS — M79676 Pain in unspecified toe(s): Secondary | ICD-10-CM

## 2015-03-23 DIAGNOSIS — B351 Tinea unguium: Secondary | ICD-10-CM

## 2015-03-23 DIAGNOSIS — E119 Type 2 diabetes mellitus without complications: Secondary | ICD-10-CM

## 2015-03-23 NOTE — Progress Notes (Signed)
Patient ID: Anthony Cuevas, male   DOB: May 19, 1950, 65 y.o.   MRN: [id number] Complaint:  Visit Type: Patient returns to my office for continued preventative foot care services. Complaint: Patient states" my nails have grown long and thick and become painful to walk and wear shoes" Patient has been diagnosed with DM with no foot complications. The patient presents for preventative foot care services. No changes to ROS  Podiatric Exam: Vascular: dorsalis pedis and posterior tibial pulses are palpable bilateral. Capillary return is immediate. Temperature gradient is WNL. Skin turgor WNL  Sensorium: Normal Semmes Weinstein monofilament test. Normal tactile sensation bilaterally. Nail Exam: Pt has thick disfigured discolored nails with subungual debris noted bilateral entire nail hallux through fifth toenails Ulcer Exam: There is no evidence of ulcer or pre-ulcerative changes or infection. Orthopedic Exam: Muscle tone and strength are WNL. No limitations in general ROM. No crepitus or effusions noted. Foot type and digits show no abnormalities. Bony prominences are unremarkable.HAV B/L Skin: No Porokeratosis. No infection or ulcers  Diagnosis:  Onychomycosis, , Pain in right toe, pain in left toes  Treatment & Plan Procedures and Treatment: Consent by patient was obtained for treatment procedures. The patient understood the discussion of treatment and procedures well. All questions were answered thoroughly reviewed. Debridement of mycotic and hypertrophic toenails, 1 through 5 bilateral and clearing of subungual debris. No ulceration, no infection noted.  Return Visit-Office Procedure: Patient instructed to return to the office for a follow up visit 3 months for continued evaluation and treatment.    Gardiner Barefoot DPM

## 2015-04-06 ENCOUNTER — Encounter: Payer: Self-pay | Admitting: Family Medicine

## 2015-04-06 ENCOUNTER — Ambulatory Visit (INDEPENDENT_AMBULATORY_CARE_PROVIDER_SITE_OTHER): Payer: Medicare Other | Admitting: Family Medicine

## 2015-04-06 VITALS — BP 114/58 | HR 113 | Temp 97.6°F | Ht 70.0 in | Wt 224.8 lb

## 2015-04-06 DIAGNOSIS — M545 Low back pain: Secondary | ICD-10-CM | POA: Diagnosis not present

## 2015-04-06 DIAGNOSIS — Z01818 Encounter for other preprocedural examination: Secondary | ICD-10-CM

## 2015-04-06 NOTE — Patient Instructions (Signed)
You look to be appropriately low risk for surgery.   I'll send a note to surgery in the meantime.  Take care.  Glad to see you.

## 2015-04-06 NOTE — Progress Notes (Signed)
Pre visit review using our clinic review tool, if applicable. No additional management support is needed unless otherwise documented below in the visit note.  Pending L2 L3 decompression.  He has constant pain, worse standing, failing medical mgmt and wants to proceed.  He has h/o DM2, HLD, HTN, but no h/o CAD, MI, CHF, CVA, TIA, DVT.  He has no CP, SOB, BLE edema.   D/w pt about risk and benefits of surgery.   Meds, vitals, and allergies reviewed.   ROS: See HPI.  Otherwise, noncontributory.  GEN: nad, alert and oriented HEENT: mucous membranes moist NECK: supple w/o LA CV: rrr PULM: ctab, no inc wob ABD: soft, +bs EXT: no edema SKIN: no acute rash  EKG w/o acute changes.  D/w pt.

## 2015-04-08 NOTE — Assessment & Plan Note (Signed)
He's going to need surgery to try to improve his quality of life. EKG w/o acute changes.  He has no CP, SOB, BLE edema.   D/w pt about risk and benefits of surgery.  He does have DM with A1c of 8, but he still should be able to heal effectively.   I don't think he'll be able to get his A1c much lower before surgery given the fact that back pain limits his exercise at this point.  Will need DM2 monitoring as inpatient.   I'll defer to surgery and anesthesia o/w.  Appropriately low risk for surgery.   >25 minutes spent in face to face time with patient, >50% spent in counselling or coordination of care

## 2015-04-27 ENCOUNTER — Encounter (HOSPITAL_COMMUNITY): Payer: Self-pay

## 2015-04-27 ENCOUNTER — Encounter (HOSPITAL_COMMUNITY)
Admission: RE | Admit: 2015-04-27 | Discharge: 2015-04-27 | Disposition: A | Discharge status: Home / Self Care | Payer: Medicare Other | Source: Ambulatory Visit | Attending: Orthopaedic Surgery | Admitting: Orthopaedic Surgery

## 2015-04-27 ENCOUNTER — Ambulatory Visit (HOSPITAL_COMMUNITY)
Admission: RE | Admit: 2015-04-27 | Discharge: 2015-04-27 | Disposition: A | Discharge status: Home / Self Care | Payer: Medicare Other | Source: Ambulatory Visit | Attending: Surgery | Admitting: Surgery

## 2015-04-27 DIAGNOSIS — Z01818 Encounter for other preprocedural examination: Secondary | ICD-10-CM

## 2015-04-27 DIAGNOSIS — E785 Hyperlipidemia, unspecified: Secondary | ICD-10-CM | POA: Diagnosis not present

## 2015-04-27 DIAGNOSIS — Z79899 Other long term (current) drug therapy: Secondary | ICD-10-CM | POA: Insufficient documentation

## 2015-04-27 DIAGNOSIS — M1712 Unilateral primary osteoarthritis, left knee: Secondary | ICD-10-CM | POA: Insufficient documentation

## 2015-04-27 DIAGNOSIS — Z7982 Long term (current) use of aspirin: Secondary | ICD-10-CM | POA: Insufficient documentation

## 2015-04-27 DIAGNOSIS — Z7984 Long term (current) use of oral hypoglycemic drugs: Secondary | ICD-10-CM | POA: Insufficient documentation

## 2015-04-27 DIAGNOSIS — I10 Essential (primary) hypertension: Secondary | ICD-10-CM | POA: Insufficient documentation

## 2015-04-27 DIAGNOSIS — E119 Type 2 diabetes mellitus without complications: Secondary | ICD-10-CM | POA: Insufficient documentation

## 2015-04-27 DIAGNOSIS — Z794 Long term (current) use of insulin: Secondary | ICD-10-CM | POA: Insufficient documentation

## 2015-04-27 DIAGNOSIS — M545 Low back pain: Secondary | ICD-10-CM | POA: Insufficient documentation

## 2015-04-27 DIAGNOSIS — Z01812 Encounter for preprocedural laboratory examination: Secondary | ICD-10-CM | POA: Diagnosis not present

## 2015-04-27 HISTORY — DX: Unspecified osteoarthritis, unspecified site: M19.90

## 2015-04-27 HISTORY — DX: Presence of spectacles and contact lenses: Z97.3

## 2015-04-27 HISTORY — DX: Spinal stenosis, lumbar region without neurogenic claudication: M48.061

## 2015-04-27 LAB — CBC
HEMATOCRIT: 30.9 % — AB (ref 39.0–52.0)
Hemoglobin: 9.1 g/dL — ABNORMAL LOW (ref 13.0–17.0)
MCH: 21.8 pg — ABNORMAL LOW (ref 26.0–34.0)
MCHC: 29.4 g/dL — AB (ref 30.0–36.0)
MCV: 73.9 fL — ABNORMAL LOW (ref 78.0–100.0)
PLATELETS: 343 10*3/uL (ref 150–400)
RBC: 4.18 MIL/uL — ABNORMAL LOW (ref 4.22–5.81)
RDW: 17.7 % — AB (ref 11.5–15.5)
WBC: 10.7 10*3/uL — AB (ref 4.0–10.5)

## 2015-04-27 LAB — URINALYSIS, ROUTINE W REFLEX MICROSCOPIC
BILIRUBIN URINE: NEGATIVE
Glucose, UA: NEGATIVE mg/dL
Hgb urine dipstick: NEGATIVE
Ketones, ur: NEGATIVE mg/dL
LEUKOCYTES UA: NEGATIVE
NITRITE: NEGATIVE
PH: 6.5 (ref 5.0–8.0)
Protein, ur: NEGATIVE mg/dL
SPECIFIC GRAVITY, URINE: 1.022 (ref 1.005–1.030)

## 2015-04-27 LAB — GLUCOSE, CAPILLARY: GLUCOSE-CAPILLARY: 119 mg/dL — AB (ref 65–99)

## 2015-04-27 LAB — SURGICAL PCR SCREEN
MRSA, PCR: NEGATIVE
STAPHYLOCOCCUS AUREUS: POSITIVE — AB

## 2015-04-27 LAB — PROTIME-INR
INR: 1.05 (ref 0.00–1.49)
Prothrombin Time: 13.9 seconds (ref 11.6–15.2)

## 2015-04-27 LAB — COMPREHENSIVE METABOLIC PANEL
ALBUMIN: 4.1 g/dL (ref 3.5–5.0)
ALT: 34 U/L (ref 17–63)
ANION GAP: 12 (ref 5–15)
AST: 36 U/L (ref 15–41)
Alkaline Phosphatase: 53 U/L (ref 38–126)
BUN: 23 mg/dL — AB (ref 6–20)
CHLORIDE: 101 mmol/L (ref 101–111)
CO2: 23 mmol/L (ref 22–32)
Calcium: 9.6 mg/dL (ref 8.9–10.3)
Creatinine, Ser: 1.22 mg/dL (ref 0.61–1.24)
GFR calc Af Amer: >60 mL/min (ref >60)
GFR calc non Af Amer: >60 mL/min (ref >60)
GLUCOSE: 134 mg/dL — AB (ref 65–99)
POTASSIUM: 4.4 mmol/L (ref 3.5–5.1)
SODIUM: 136 mmol/L (ref 135–145)
Total Bilirubin: 0.7 mg/dL (ref 0.3–1.2)
Total Protein: 7.9 g/dL (ref 6.5–8.1)

## 2015-04-27 LAB — APTT: APTT: 29 s (ref 24–37)

## 2015-04-27 MED ORDER — CEFAZOLIN SODIUM-DEXTROSE 2-4 GM/100ML-% IV SOLN
2.0000 g | INTRAVENOUS | Status: AC
  Administered 2015-04-28: 2 g via INTRAVENOUS
  Filled 2015-04-27: qty 100

## 2015-04-27 NOTE — Progress Notes (Signed)
Anesthesia Chart Review: Patient is a 65 year old male scheduled for L2-3 decompression tomorrow by Dr. Lorin Mercy. PAT was this morning.  History includes former smoker, DM2, HLD, HTN, depression, GERD, C5-7 ACDF s/p removal of anterior plate/distal screws '05, C5-7 posterior fusion '04, chronic neck pain, muscle atrophy (BUE from DDD of the c-spine), PUD s/p surgery, Nissan fundoplication,  left TKA '13, SCC excision (right posterior neck) '03. BMI is consistent with obesity.   PCP is Dr. Elsie Stain. Per 04/08/15 notation: Patient "Appropriately low risk for surgery." He will need close DM monitoring perioperatively.   Meds include ASA 325mg , Celebrex, Flexeril, glipizide, Norco, Novolog, Lantus, lisinopril, Lumigan soln, metformin, Niaspan, Actos, pravastatin, Zoloft.   PAT Vitals: HR 101, BP 137/66, RR 20, T 36.8C, O2 sat 99%. CBG 119.   04/06/15 EKG: SR at 98 bpm.  Preoperative labs H/H 9.1/30.9 (not recent comparison labs, previously 13.8/40.1 on 05/30/12). MCV low at 73.9. (Negative fecal occult blood last year.) A1c on 02/12/15 was 8.0. Reviewed labs with anesthesiologist Dr. Veatrice Kells. Since HGB is < 10, will get a T&S on the day of surgery. He will need out-patient follow-up for further evaluation of his anemia (CBC routed to Dr. Damita Dunnings for future follow-up purposes). I left a voice message with Malachy Mood at Dr. Lorin Mercy' office as well regarding H/H results.    Anthony Cuevas [facility] Short Stay Center/Anesthesiology Phone [phone] 04/27/2015 2:00 PM

## 2015-04-27 NOTE — Progress Notes (Signed)
Pt positive for Staph, treat with Betadine DOS. Stat Type and Screen DOS.

## 2015-04-27 NOTE — Pre-Procedure Instructions (Signed)
TREVIOUS FOUCHE  04/27/2015      [facility] - [place], [place] [address] [place] [address] Phone: [phone] Fax: [phone]  [facility] [place], [place] - [address] [place] [address] Phone: [phone] Fax: [phone]   Your procedure is scheduled on Wednesday, April 28, 2015  Report to [facility] Admitting at 1:00 P.M.  Call this number if you have problems the morning of surgery:  [phone]   Remember:  Do not eat food or drink liquids after midnight.  Take these medicines the morning of surgery with A SIP OF WATER: sertraline (ZOLOFT), if needed: HYDROcodone-acetaminophen (NORCO/VICODIN) for pain  Stop taking Aspirin, vitamins, fish oil, and herbal medications. Do not take any NSAIDs ie: Ibuprofen, Advil, Naproxen, BC and Goody Powder or any medication containing Aspirin such as celecoxib (CELEBREX); stop now.   How to Manage Your Diabetes Before and After Surgery  Why is it important to control my blood sugar before and after surgery? . Improving blood sugar levels before and after surgery helps healing and can limit problems. . A way of improving blood sugar control is eating a healthy diet by: o  Eating less sugar and carbohydrates o  Increasing activity/exercise o  Talking with your doctor about reaching your blood sugar goals . High blood sugars (greater than 180 mg/dL) can raise your risk of infections and slow your recovery, so you will need to focus on controlling your diabetes during the weeks before surgery. . Make sure that the doctor who takes care of your diabetes knows about your planned surgery including the date and location.  How do I manage my blood sugar before surgery? . Check your blood sugar at least 4 times a day, starting 2 days before surgery, to make sure that the level is not too high or low. o Check your blood  sugar the morning of your surgery when you wake up and every 2 hours until you get to the Short Stay unit. . If your blood sugar is less than 70 mg/dL, you will need to treat for low blood sugar: o Do not take insulin. o Treat a low blood sugar (less than 70 mg/dL) with  cup of clear juice (cranberry or apple), 4 glucose tablets, OR glucose gel. o Recheck blood sugar in 15 minutes after treatment (to make sure it is greater than 70 mg/dL). If your blood sugar is not greater than 70 mg/dL on recheck, call [phone] for further instructions. . Report your blood sugar to the short stay nurse when you get to Short Stay.  . If you are admitted to the hospital after surgery: o Your blood sugar will be checked by the staff and you will probably be given insulin after surgery (instead of oral diabetes medicines) to make sure you have good blood sugar levels. o The goal for blood sugar control after surgery is 80-180 mg/dL.    WHAT DO I DO ABOUT MY DIABETES MEDICATION?   Marland Kitchen Do not take oral diabetes medicines (pills) the morning of surgery such as pioglitazone (ACTOS), metFORMIN (GLUCOPHAGE) and glipiZIDE (GLIPIZIDE XL).  . THE NIGHT BEFORE SURGERY, take __60__units of __insulin glargine (LANTUS) insulin.   . If your CBG is greater than 220 mg/dL, you may take  of your sliding scale (correction) dose of insulin.  Other Instructions: DO not take glipiZIDE (GLIPIZIDE XL) this evening/night  Do not  take any insulin the morning of surgery.  Patient Signature:  Date:   Nurse Signature:  Date:   Reviewed and Endorsed by [facility] Patient Education Committee, August 2015  Do not wear jewelry, make-up or nail polish.  Do not wear lotions, powders, or perfumes.  You may wear deodorant.  Do not shave 48 hours prior to surgery.  Men may shave face and neck.  Do not bring valuables to the hospital.  [facility] is not responsible for any belongings or valuables.  Contacts, dentures or bridgework  may not be worn into surgery.  Leave your suitcase in the car.  After surgery it may be brought to your room.  For patients admitted to the hospital, discharge time will be determined by your treatment team.  Patients discharged the day of surgery will not be allowed to drive home.   Name and phone number of your driver:   Special instructions: Shower the night before surgery and the morning of surgery with CHG.  Please read over the following fact sheets that you were given. Pain Booklet, Coughing and Deep Breathing, MRSA Information and Surgical Site Infection Prevention

## 2015-04-27 NOTE — H&P (Signed)
Anthony Cuevas is an 65 y.o. male.   The patient returns for followup of claudication symptoms.  He states he can only walk about 40 to 50 feet.  He has to stop and sit.  He gets relief when he sits.  After he sits for a few minutes, he can repeat it.  He does better if he leans over a grocery cart when he goes in the store with his wife.  Otherwise, he cannot go through the store.  He gets relief when he lays down.  His symptoms have gradually progressed over the last 6 to 12 months and the distance he is able to ambulate is getting shorter.  He has tried to do a walking program to help his diabetes.  He is on insulin 75 units.   Sometimes he has some cramping in his legs.     MEDICATIONS:  He takes Actos, metformin, glipizide, lisinopril, niacin and Zoloft.  He has used some Flexeril to help at night.    ALLERGIES:  None.   SOCIAL HISTORY:  He is married to his wife, Butch Penny.  The patient is retired.  Does not smoke, but did used to smoke for 20+ years, quit about 4 years ago when he was about a 1-pack per day smoker.     REVIEW OF SYSTEMS:  A 14-point review of systems is positive for knee arthritis.  He had a total knee arthroplasty, which is doing well on the left knee.  Previous problems with cataracts and diabetes type 2.     Past Medical History  Diagnosis Date  . Diabetes mellitus, type 2 ([place]) 09/1996  . Hyperlipidemia 1992  . Hypertension 05/2003  . Neck pain     chronic  . Smoker   . Depression     improved on sertraline  . Muscle atrophy     in arms from degenerative changes in neck  . GERD (gastroesophageal reflux disease) 1990  . Wears glasses   . Lumbar stenosis L2-3  . Arthritis     Past Surgical History  Procedure Laterality Date  . Stomach surgery  1984    PUD, HH  . Hemorrhoid surgery  1989  . Cervical discectomy  1997    partial  . Cervical discectomy  2000  . Scc excision  04/25/01    Dr. March Rummage  . Replacement total knee  02/14/02, 05/04/02    left- partial   . Cervical disc surgery  04/19/02    fusion, Dr. Lorin Mercy  . Mr Jodene Nam duplicate exam  inactivate    . Neisen funduplication  [id number]  . Knee arthroplasty  11/20/2011    Procedure: COMPUTER ASSISTED TOTAL KNEE ARTHROPLASTY;  Surgeon: Marybelle Killings, MD;  Location: [facility];  Service: Orthopedics;  Laterality: Left;  Conversion Left Knee Medial Uni to Total Knee Arthroplasty-Cemented  . Multiple tooth extractions      Family History  Problem Relation Age of Onset  . Diabetes Mother   . Hypertension Mother   . Stroke Mother   . Diabetes Sister   . Diabetes Brother   . Cancer Maternal Grandmother     breast  . Lung disease Paternal Grandfather     black lung  . Arthritis Neg Hx   . Prostate cancer Neg Hx   . Colon cancer Neg Hx   . Diabetes Brother   . Diabetes Father    Social History:  reports that he quit smoking about 3 years ago. His smoking use included Cigarettes. He  has a 15 pack-year smoking history. He has never used smokeless tobacco. He reports that he does not drink alcohol or use illicit drugs.  Allergies: No Known Allergies  No prescriptions prior to admission    Results for orders placed or performed during the hospital encounter of 04/27/15 (from the past 48 hour(s))  Glucose, capillary     Status: Abnormal   Collection Time: 04/27/15  9:45 AM  Result Value Ref Range   Glucose-Capillary 119 (H) 65 - 99 mg/dL  Urinalysis, Routine w reflex microscopic (not at [facility])     Status: None   Collection Time: 04/27/15 10:15 AM  Result Value Ref Range   Color, Urine YELLOW YELLOW   APPearance CLEAR CLEAR   Specific Gravity, Urine 1.022 1.005 - 1.030   pH 6.5 5.0 - 8.0   Glucose, UA NEGATIVE NEGATIVE mg/dL   Hgb urine dipstick NEGATIVE NEGATIVE   Bilirubin Urine NEGATIVE NEGATIVE   Ketones, ur NEGATIVE NEGATIVE mg/dL   Protein, ur NEGATIVE NEGATIVE mg/dL   Nitrite NEGATIVE NEGATIVE   Leukocytes, UA NEGATIVE NEGATIVE    Comment: MICROSCOPIC NOT DONE ON URINES WITH NEGATIVE  PROTEIN, BLOOD, LEUKOCYTES, NITRITE, OR GLUCOSE <1000 mg/dL.  Surgical pcr screen     Status: Abnormal   Collection Time: 04/27/15 10:15 AM  Result Value Ref Range   MRSA, PCR NEGATIVE NEGATIVE   Staphylococcus aureus POSITIVE (A) NEGATIVE    Comment:        The Xpert SA Assay (FDA approved for NASAL specimens in patients over 51 years of age), is one component of a comprehensive surveillance program.  Test performance has been validated by [facility] for patients greater than or equal to 34 year old. It is not intended to diagnose infection nor to guide or monitor treatment.   APTT     Status: None   Collection Time: 04/27/15 10:16 AM  Result Value Ref Range   aPTT 29 24 - 37 seconds  CBC     Status: Abnormal   Collection Time: 04/27/15 10:16 AM  Result Value Ref Range   WBC 10.7 (H) 4.0 - 10.5 K/uL   RBC 4.18 (L) 4.22 - 5.81 MIL/uL   Hemoglobin 9.1 (L) 13.0 - 17.0 g/dL   HCT 30.9 (L) 39.0 - 52.0 %   MCV 73.9 (L) 78.0 - 100.0 fL   MCH 21.8 (L) 26.0 - 34.0 pg   MCHC 29.4 (L) 30.0 - 36.0 g/dL   RDW 17.7 (H) 11.5 - 15.5 %   Platelets 343 150 - 400 K/uL  Comprehensive metabolic panel     Status: Abnormal   Collection Time: 04/27/15 10:16 AM  Result Value Ref Range   Sodium 136 135 - 145 mmol/L   Potassium 4.4 3.5 - 5.1 mmol/L   Chloride 101 101 - 111 mmol/L   CO2 23 22 - 32 mmol/L   Glucose, Bld 134 (H) 65 - 99 mg/dL   BUN 23 (H) 6 - 20 mg/dL   Creatinine, Ser 1.22 0.61 - 1.24 mg/dL   Calcium 9.6 8.9 - 10.3 mg/dL   Total Protein 7.9 6.5 - 8.1 g/dL   Albumin 4.1 3.5 - 5.0 g/dL   AST 36 15 - 41 U/L   ALT 34 17 - 63 U/L   Alkaline Phosphatase 53 38 - 126 U/L   Total Bilirubin 0.7 0.3 - 1.2 mg/dL   GFR calc non Af Amer >60 >60 mL/min   GFR calc Af Amer >60 >60 mL/min    Comment: (  NOTE) The eGFR has been calculated using the CKD EPI equation. This calculation has not been validated in all clinical situations. eGFR's persistently <60 mL/min signify possible Chronic  Kidney Disease.    Anion gap 12 5 - 15  Protime-INR     Status: None   Collection Time: 04/27/15 10:16 AM  Result Value Ref Range   Prothrombin Time 13.9 11.6 - 15.2 seconds   INR 1.05 0.00 - 1.49   Dg Chest 2 View  04/27/2015  CLINICAL DATA:  Hypertension.  Pre admit for low back pain. EXAM: CHEST  2 VIEW COMPARISON:  November 07, 2011 FINDINGS: The heart size and mediastinal contours are within normal limits. There is no focal infiltrate, pulmonary edema, or pleural effusion. The visualized skeletal structures are unremarkable. IMPRESSION: No active cardiopulmonary disease. Electronically Signed   By: Abelardo Diesel M.D.   On: 04/27/2015 10:40    Review of Systems  Constitutional: Negative.   HENT: Negative.   Eyes: Negative.   Respiratory: Negative.   Cardiovascular: Negative.   Gastrointestinal: Negative.   Genitourinary: Negative.   Musculoskeletal: Positive for back pain.  Skin: Negative.   Neurological: Positive for tingling (left thigh).  Psychiatric/Behavioral: Negative.     There were no vitals taken for this visit. Physical Exam  Constitutional: He is oriented to person, place, and time. No distress.  HENT:  Head: Atraumatic.  Eyes: EOM are normal. Pupils are equal, round, and reactive to light.  Neck: Normal range of motion.  Cardiovascular: Normal rate.   Respiratory: Effort normal. No respiratory distress.  GI: He exhibits no distension.  Musculoskeletal: He exhibits tenderness.  Neurological: He is alert and oriented to person, place, and time.  Skin: Skin is warm and dry.  Psychiatric: He has a normal mood and affect.   PHYSICAL EXAMINATION:  The patient is alert and oriented.  Height 5 feet 10 inches.  Extraocular movements are intact.  No audible wheezing.  He is missing some teeth.  No rash over exposed skin.  Well-healed left knee midline incision.  No pain with hip range of motion.  Negative straight leg raising.  Normal heel-toe gait.  With ambulation, he  tends to have a little bit more left than right leg pain.  Abdomen is round.  No abdominal tenderness.  He has some mild tenderness to palpation over the paralumbar muscles.  He has some sciatic notch tenderness.  Knees reach full extension.  Distal pulses are palpable.   RADIOGRAPHS:  MRI scan is reviewed, which shows moderate stenosis at L2-3, only mild changes at L3-4 and L1-2.  Assessment: L2-3 stenosis and worsening LBP and neurogenic claudication  PLAN:  We discussed options.  He states he even has to stop when he goes to walk to the chicken [place], [place] his house.  He asked about surgical options and we discussed L2-3 decompression surgery and overnight stay.  He understands he might have some progression at the other levels, which are mild.  We reviewed the potential for repeat stenosis that can occur over a number of years, possibly 10 to 61.  All questions answered.  He understands and requests to proceed.  Lanae Crumbly, PA-C 04/27/2015, 4:48 PM

## 2015-04-27 NOTE — Progress Notes (Signed)
Pt denies SOB, chest pain, and being under the care of a cardiologist. Pt denies having a stress test, echo and cardiac cath. Pt denies having a chest x ray within the last year. Pt stated that his fasting blood sugar is usually  between 107-115. Pt chart forwarded to anesthesia to review clearance note in Epic.

## 2015-04-28 ENCOUNTER — Ambulatory Visit (HOSPITAL_COMMUNITY): Payer: Medicare Other

## 2015-04-28 ENCOUNTER — Ambulatory Visit (HOSPITAL_COMMUNITY): Payer: Medicare Other | Admitting: Vascular Surgery

## 2015-04-28 ENCOUNTER — Observation Stay (HOSPITAL_COMMUNITY)
Admission: RE | Admit: 2015-04-28 | Discharge: 2015-04-29 | Disposition: A | Discharge status: Home / Self Care | Payer: Medicare Other | Source: Ambulatory Visit | Attending: Orthopaedic Surgery | Admitting: Orthopaedic Surgery

## 2015-04-28 ENCOUNTER — Encounter (HOSPITAL_COMMUNITY): Payer: Self-pay | Admitting: Surgery

## 2015-04-28 ENCOUNTER — Encounter (HOSPITAL_COMMUNITY)
Admission: RE | Disposition: A | Discharge status: Home / Self Care | Payer: Self-pay | Source: Ambulatory Visit | Attending: Orthopaedic Surgery

## 2015-04-28 ENCOUNTER — Ambulatory Visit (HOSPITAL_COMMUNITY): Payer: Medicare Other | Admitting: Anesthesiology

## 2015-04-28 DIAGNOSIS — M5136 Other intervertebral disc degeneration, lumbar region: Secondary | ICD-10-CM | POA: Diagnosis not present

## 2015-04-28 DIAGNOSIS — Z6831 Body mass index (BMI) 31.0-31.9, adult: Secondary | ICD-10-CM | POA: Diagnosis not present

## 2015-04-28 DIAGNOSIS — Z79899 Other long term (current) drug therapy: Secondary | ICD-10-CM | POA: Diagnosis not present

## 2015-04-28 DIAGNOSIS — M4806 Spinal stenosis, lumbar region: Secondary | ICD-10-CM | POA: Diagnosis not present

## 2015-04-28 DIAGNOSIS — Z9889 Other specified postprocedural states: Secondary | ICD-10-CM

## 2015-04-28 DIAGNOSIS — E119 Type 2 diabetes mellitus without complications: Secondary | ICD-10-CM | POA: Diagnosis not present

## 2015-04-28 DIAGNOSIS — K219 Gastro-esophageal reflux disease without esophagitis: Secondary | ICD-10-CM | POA: Diagnosis not present

## 2015-04-28 DIAGNOSIS — F329 Major depressive disorder, single episode, unspecified: Secondary | ICD-10-CM | POA: Diagnosis not present

## 2015-04-28 DIAGNOSIS — Z7982 Long term (current) use of aspirin: Secondary | ICD-10-CM | POA: Diagnosis not present

## 2015-04-28 DIAGNOSIS — Z87891 Personal history of nicotine dependence: Secondary | ICD-10-CM | POA: Insufficient documentation

## 2015-04-28 DIAGNOSIS — E785 Hyperlipidemia, unspecified: Secondary | ICD-10-CM | POA: Diagnosis not present

## 2015-04-28 DIAGNOSIS — I10 Essential (primary) hypertension: Secondary | ICD-10-CM | POA: Insufficient documentation

## 2015-04-28 DIAGNOSIS — Z96652 Presence of left artificial knee joint: Secondary | ICD-10-CM | POA: Insufficient documentation

## 2015-04-28 DIAGNOSIS — Z419 Encounter for procedure for purposes other than remedying health state, unspecified: Secondary | ICD-10-CM

## 2015-04-28 DIAGNOSIS — Z794 Long term (current) use of insulin: Secondary | ICD-10-CM | POA: Diagnosis not present

## 2015-04-28 HISTORY — PX: LUMBAR LAMINECTOMY/DECOMPRESSION MICRODISCECTOMY: SHX5026

## 2015-04-28 LAB — GLUCOSE, CAPILLARY
GLUCOSE-CAPILLARY: 208 mg/dL — AB (ref 65–99)
GLUCOSE-CAPILLARY: 150 mg/dL — AB (ref 65–99)
Glucose-Capillary: 187 mg/dL — ABNORMAL HIGH (ref 65–99)
Glucose-Capillary: 185 mg/dL — ABNORMAL HIGH (ref 65–99)

## 2015-04-28 LAB — HEMOGLOBIN A1C
Hgb A1c MFr Bld: 8 % — ABNORMAL HIGH (ref 4.8–5.6)
MEAN PLASMA GLUCOSE: 183 mg/dL

## 2015-04-28 LAB — TYPE AND SCREEN
ABO/RH(D): A POS
Antibody Screen: NEGATIVE

## 2015-04-28 SURGERY — LUMBAR LAMINECTOMY/DECOMPRESSION MICRODISCECTOMY
Anesthesia: General

## 2015-04-28 MED ORDER — INSULIN GLARGINE 100 UNIT/ML ~~LOC~~ SOLN
75.0000 [IU] | Freq: Every day | SUBCUTANEOUS | Status: DC
  Administered 2015-04-28: 75 [IU] via SUBCUTANEOUS
  Filled 2015-04-28 (×2): qty 0.75

## 2015-04-28 MED ORDER — INSULIN ASPART 100 UNIT/ML ~~LOC~~ SOLN
5.0000 [IU] | Freq: Three times a day (TID) | SUBCUTANEOUS | Status: DC
  Administered 2015-04-29: 5 [IU] via SUBCUTANEOUS

## 2015-04-28 MED ORDER — METFORMIN HCL 500 MG PO TABS
500.0000 mg | ORAL_TABLET | Freq: Two times a day (BID) | ORAL | Status: DC
  Administered 2015-04-29: 500 mg via ORAL
  Filled 2015-04-28: qty 1

## 2015-04-28 MED ORDER — SODIUM CHLORIDE 0.9% FLUSH
3.0000 mL | Freq: Two times a day (BID) | INTRAVENOUS | Status: DC
  Administered 2015-04-29: 3 mL via INTRAVENOUS

## 2015-04-28 MED ORDER — ONDANSETRON HCL 4 MG/2ML IJ SOLN
4.0000 mg | INTRAMUSCULAR | Status: DC | PRN
Start: 2015-04-28 — End: 2015-04-29

## 2015-04-28 MED ORDER — LACTATED RINGERS IV SOLN
INTRAVENOUS | Status: DC
  Administered 2015-04-28: 14:00:00 via INTRAVENOUS

## 2015-04-28 MED ORDER — MIDAZOLAM HCL 2 MG/2ML IJ SOLN
INTRAMUSCULAR | Status: AC
  Filled 2015-04-28: qty 2

## 2015-04-28 MED ORDER — SODIUM CHLORIDE 0.9 % IV SOLN: 250.0000 mL | INTRAVENOUS | Status: DC

## 2015-04-28 MED ORDER — LIDOCAINE HCL (CARDIAC) 20 MG/ML IV SOLN
INTRAVENOUS | Status: DC | PRN
  Administered 2015-04-28: 60 mg via INTRAVENOUS

## 2015-04-28 MED ORDER — HYDROMORPHONE HCL 1 MG/ML IJ SOLN
0.5000 mg | INTRAMUSCULAR | Status: DC | PRN
  Administered 2015-04-29: 0.5 mg via INTRAVENOUS
  Administered 2015-04-29: 1 mg via INTRAVENOUS
  Filled 2015-04-28 (×2): qty 1

## 2015-04-28 MED ORDER — GLIPIZIDE ER 2.5 MG PO TB24
2.5000 mg | ORAL_TABLET | Freq: Every day | ORAL | Status: DC
  Filled 2015-04-28: qty 1

## 2015-04-28 MED ORDER — POLYETHYLENE GLYCOL 3350 17 G PO PACK: 17.0000 g | PACK | Freq: Every day | ORAL | Status: DC | PRN

## 2015-04-28 MED ORDER — ONDANSETRON HCL 4 MG/2ML IJ SOLN
INTRAMUSCULAR | Status: AC
  Filled 2015-04-28: qty 2

## 2015-04-28 MED ORDER — ROCURONIUM BROMIDE 100 MG/10ML IV SOLN
INTRAVENOUS | Status: DC | PRN
  Administered 2015-04-28: 50 mg via INTRAVENOUS

## 2015-04-28 MED ORDER — OXYCODONE-ACETAMINOPHEN 5-325 MG PO TABS
1.0000 | ORAL_TABLET | Freq: Four times a day (QID) | ORAL | Status: DC | PRN
  Administered 2015-04-29 (×2): 2 via ORAL
  Filled 2015-04-28 (×2): qty 2

## 2015-04-28 MED ORDER — FENTANYL CITRATE (PF) 100 MCG/2ML IJ SOLN
INTRAMUSCULAR | Status: DC | PRN
  Administered 2015-04-28: 250 ug via INTRAVENOUS

## 2015-04-28 MED ORDER — PROPOFOL 10 MG/ML IV BOLUS
INTRAVENOUS | Status: AC
  Filled 2015-04-28: qty 20

## 2015-04-28 MED ORDER — 0.9 % SODIUM CHLORIDE (POUR BTL) OPTIME
TOPICAL | Status: DC | PRN
  Administered 2015-04-28: 1000 mL

## 2015-04-28 MED ORDER — SODIUM CHLORIDE 0.45 % IV SOLN
INTRAVENOUS | Status: DC
  Administered 2015-04-29: 05:00:00 via INTRAVENOUS

## 2015-04-28 MED ORDER — CHLORHEXIDINE GLUCONATE 4 % EX LIQD: 60.0000 mL | Freq: Once | CUTANEOUS | Status: DC

## 2015-04-28 MED ORDER — PIOGLITAZONE HCL 45 MG PO TABS
45.0000 mg | ORAL_TABLET | Freq: Every day | ORAL | Status: DC
Start: 2015-04-29 — End: 2015-04-29
  Filled 2015-04-28 (×2): qty 1

## 2015-04-28 MED ORDER — DEXTROSE 5 % IV SOLN
500.0000 mg | Freq: Four times a day (QID) | INTRAVENOUS | Status: DC | PRN
  Filled 2015-04-28: qty 5

## 2015-04-28 MED ORDER — LISINOPRIL 20 MG PO TABS
20.0000 mg | ORAL_TABLET | Freq: Every day | ORAL | Status: DC
Start: 2015-04-29 — End: 2015-04-29
  Filled 2015-04-28: qty 1

## 2015-04-28 MED ORDER — PROPOFOL 10 MG/ML IV BOLUS
INTRAVENOUS | Status: DC | PRN
  Administered 2015-04-28: 50 mg via INTRAVENOUS
  Administered 2015-04-28: 200 mg via INTRAVENOUS

## 2015-04-28 MED ORDER — MENTHOL 3 MG MT LOZG: 1.0000 | LOZENGE | OROMUCOSAL | Status: DC | PRN

## 2015-04-28 MED ORDER — PRAVASTATIN SODIUM 40 MG PO TABS
80.0000 mg | ORAL_TABLET | Freq: Every evening | ORAL | Status: DC
  Administered 2015-04-29: 80 mg via ORAL
  Filled 2015-04-28: qty 2

## 2015-04-28 MED ORDER — HEMOSTATIC AGENTS (NO CHARGE) OPTIME
TOPICAL | Status: DC | PRN
  Administered 2015-04-28: 1 via TOPICAL

## 2015-04-28 MED ORDER — DOCUSATE SODIUM 100 MG PO CAPS
100.0000 mg | ORAL_CAPSULE | Freq: Two times a day (BID) | ORAL | Status: DC
  Administered 2015-04-28 – 2015-04-29 (×2): 100 mg via ORAL
  Filled 2015-04-28 (×2): qty 1

## 2015-04-28 MED ORDER — ONDANSETRON HCL 4 MG/2ML IJ SOLN
INTRAMUSCULAR | Status: DC | PRN
  Administered 2015-04-28: 4 mg via INTRAVENOUS

## 2015-04-28 MED ORDER — BUPIVACAINE HCL (PF) 0.25 % IJ SOLN
INTRAMUSCULAR | Status: AC
  Filled 2015-04-28: qty 30

## 2015-04-28 MED ORDER — NIACIN ER (ANTIHYPERLIPIDEMIC) 500 MG PO TBCR
500.0000 mg | EXTENDED_RELEASE_TABLET | Freq: Every day | ORAL | Status: DC
  Filled 2015-04-28: qty 1

## 2015-04-28 MED ORDER — MIDAZOLAM HCL 5 MG/5ML IJ SOLN
INTRAMUSCULAR | Status: DC | PRN
  Administered 2015-04-28: 1 mg via INTRAVENOUS

## 2015-04-28 MED ORDER — HYDROMORPHONE HCL 1 MG/ML IJ SOLN
INTRAMUSCULAR | Status: AC
  Administered 2015-04-28: 0.5 mg via INTRAVENOUS
  Filled 2015-04-28: qty 1

## 2015-04-28 MED ORDER — LACTATED RINGERS IV SOLN
INTRAVENOUS | Status: DC | PRN
  Administered 2015-04-28 (×2): via INTRAVENOUS

## 2015-04-28 MED ORDER — METHOCARBAMOL 500 MG PO TABS
500.0000 mg | ORAL_TABLET | Freq: Four times a day (QID) | ORAL | Status: DC | PRN
  Administered 2015-04-29: 500 mg via ORAL
  Filled 2015-04-28: qty 1

## 2015-04-28 MED ORDER — PHENOL 1.4 % MT LIQD: 1.0000 | OROMUCOSAL | Status: DC | PRN

## 2015-04-28 MED ORDER — BUPIVACAINE HCL (PF) 0.25 % IJ SOLN
INTRAMUSCULAR | Status: DC | PRN
  Administered 2015-04-28: 6 mL

## 2015-04-28 MED ORDER — HYDROMORPHONE HCL 1 MG/ML IJ SOLN
INTRAMUSCULAR | Status: AC
  Filled 2015-04-28: qty 1

## 2015-04-28 MED ORDER — CEFAZOLIN SODIUM 1-5 GM-% IV SOLN
1.0000 g | Freq: Three times a day (TID) | INTRAVENOUS | Status: DC
  Administered 2015-04-28: 1 g via INTRAVENOUS
  Filled 2015-04-28 (×2): qty 50

## 2015-04-28 MED ORDER — OXYCODONE-ACETAMINOPHEN 5-325 MG PO TABS
ORAL_TABLET | ORAL | Status: AC
  Administered 2015-04-28: 2
  Filled 2015-04-28: qty 2

## 2015-04-28 MED ORDER — FENTANYL CITRATE (PF) 250 MCG/5ML IJ SOLN
INTRAMUSCULAR | Status: AC
  Filled 2015-04-28: qty 5

## 2015-04-28 MED ORDER — SODIUM CHLORIDE 0.9% FLUSH: 3.0000 mL | INTRAVENOUS | Status: DC | PRN

## 2015-04-28 MED ORDER — SUGAMMADEX SODIUM 200 MG/2ML IV SOLN
INTRAVENOUS | Status: DC | PRN
  Administered 2015-04-28: 200 mg via INTRAVENOUS

## 2015-04-28 MED ORDER — SERTRALINE HCL 100 MG PO TABS
100.0000 mg | ORAL_TABLET | Freq: Every day | ORAL | Status: DC
  Administered 2015-04-29: 100 mg via ORAL
  Filled 2015-04-28: qty 1

## 2015-04-28 MED ORDER — LATANOPROST 0.005 % OP SOLN
1.0000 [drp] | Freq: Every day | OPHTHALMIC | Status: DC
  Administered 2015-04-28: 1 [drp] via OPHTHALMIC
  Filled 2015-04-28: qty 2.5

## 2015-04-28 MED ORDER — HYDROMORPHONE HCL 1 MG/ML IJ SOLN
0.2500 mg | INTRAMUSCULAR | Status: DC | PRN
  Administered 2015-04-28 (×3): 0.5 mg via INTRAVENOUS

## 2015-04-28 SURGICAL SUPPLY — 45 items
BUR ROUND FLUTED 4 SOFT TCH (BURR) ×2 IMPLANT
BUR ROUND FLUTED 4MM SOFT TCH (BURR) ×1
CLOSURE STERI-STRIP 1/2X4 (GAUZE/BANDAGES/DRESSINGS) ×1
CLSR STERI-STRIP ANTIMIC 1/2X4 (GAUZE/BANDAGES/DRESSINGS) ×2 IMPLANT
COVER SURGICAL LIGHT HANDLE (MISCELLANEOUS) ×3 IMPLANT
DERMABOND ADVANCED (GAUZE/BANDAGES/DRESSINGS) ×2
DERMABOND ADVANCED .7 DNX12 (GAUZE/BANDAGES/DRESSINGS) ×1 IMPLANT
DRAPE MICROSCOPE LEICA (MISCELLANEOUS) ×3 IMPLANT
DRAPE PROXIMA HALF (DRAPES) ×6 IMPLANT
DRSG MEPILEX BORDER 4X4 (GAUZE/BANDAGES/DRESSINGS) ×3 IMPLANT
DRSG MEPILEX BORDER 4X8 (GAUZE/BANDAGES/DRESSINGS) ×3 IMPLANT
DURAPREP 26ML APPLICATOR (WOUND CARE) ×3 IMPLANT
ELECT REM PT RETURN 9FT ADLT (ELECTROSURGICAL) ×3
ELECTRODE REM PT RTRN 9FT ADLT (ELECTROSURGICAL) ×1 IMPLANT
GLOVE BIOGEL M 6.5 STRL (GLOVE) ×3 IMPLANT
GLOVE BIOGEL PI IND STRL 6.5 (GLOVE) ×1 IMPLANT
GLOVE BIOGEL PI IND STRL 8 (GLOVE) ×2 IMPLANT
GLOVE BIOGEL PI INDICATOR 6.5 (GLOVE) ×2
GLOVE BIOGEL PI INDICATOR 8 (GLOVE) ×4
GLOVE ORTHO TXT STRL SZ7.5 (GLOVE) ×6 IMPLANT
GOWN STRL REUS W/ TWL LRG LVL3 (GOWN DISPOSABLE) ×1 IMPLANT
GOWN STRL REUS W/ TWL XL LVL3 (GOWN DISPOSABLE) ×1 IMPLANT
GOWN STRL REUS W/TWL 2XL LVL3 (GOWN DISPOSABLE) ×3 IMPLANT
GOWN STRL REUS W/TWL LRG LVL3 (GOWN DISPOSABLE) ×2
GOWN STRL REUS W/TWL XL LVL3 (GOWN DISPOSABLE) ×2
KIT BASIN OR (CUSTOM PROCEDURE TRAY) ×3 IMPLANT
KIT ROOM TURNOVER OR (KITS) ×3 IMPLANT
MANIFOLD NEPTUNE II (INSTRUMENTS) ×3 IMPLANT
MATRIX HEMOSTAT SURGIFLO (HEMOSTASIS) ×3 IMPLANT
NEEDLE HYPO 25GX1X1/2 BEV (NEEDLE) ×3 IMPLANT
NEEDLE SPNL 18GX3.5 QUINCKE PK (NEEDLE) ×3 IMPLANT
NS IRRIG 1000ML POUR BTL (IV SOLUTION) ×3 IMPLANT
PACK LAMINECTOMY ORTHO (CUSTOM PROCEDURE TRAY) ×3 IMPLANT
PAD ARMBOARD 7.5X6 YLW CONV (MISCELLANEOUS) ×6 IMPLANT
PATTIES SURGICAL .5 X.5 (GAUZE/BANDAGES/DRESSINGS) ×3 IMPLANT
PATTIES SURGICAL .75X.75 (GAUZE/BANDAGES/DRESSINGS) IMPLANT
SUT BONE WAX W31G (SUTURE) IMPLANT
SUT VIC AB 0 CT1 27 (SUTURE) ×2
SUT VIC AB 0 CT1 27XBRD ANBCTR (SUTURE) ×1 IMPLANT
SUT VIC AB 2-0 CT1 27 (SUTURE) ×2
SUT VIC AB 2-0 CT1 TAPERPNT 27 (SUTURE) ×1 IMPLANT
SUT VIC AB 3-0 X1 27 (SUTURE) IMPLANT
TOWEL OR 17X24 6PK STRL BLUE (TOWEL DISPOSABLE) ×3 IMPLANT
TOWEL OR 17X26 10 PK STRL BLUE (TOWEL DISPOSABLE) ×3 IMPLANT
WATER STERILE IRR 1000ML POUR (IV SOLUTION) IMPLANT

## 2015-04-28 NOTE — Op Note (Signed)
NAME:  Anthony Cuevas, Anthony Cuevas NO.:  [id number]  MEDICAL RECORD NO.:  [id number]  LOCATION:  61C20C                        FACILITY:  [facility]  PHYSICIAN:  Mark C. Lorin Mercy, M.D.    DATE OF BIRTH:  28-Sep-1950  DATE OF PROCEDURE:  04/28/2015 DATE OF DISCHARGE:                              OPERATIVE REPORT   PREOPERATIVE DIAGNOSES:  L2-3 stenosis, L2-3 decompression, lateral recess decompression bilaterally.  POSTOPERATIVE DIAGNOSES:  L2-3 stenosis, L2-3 decompression, lateral recess decompression bilaterally.  SURGEON:  Rodell Perna M.D.  ASSISTANT:  Alyson Locket. Ricard Dillon, PA-C, medically necessary and present for the procedure.  DESCRIPTION OF PROCEDURE:  After induction of general anesthesia, orotracheal intubation, Ancef prophylaxis, the patient was placed prone on chest rolls.  Prepping and draping was performed.  Area was squared with towels.  DuraPrep was used.  Betadine, Steri-Drape, laminectomy sheet, spinal needles placed at the appropriate level based on palpable landmarks at 2-3.  Second x-ray was taken.  Once midline incision was made, subperiosteal dissection, self-retaining retractor with McCullough retractor placed and 2 Kocher clamps were placed above and below the L2- 3 disk space.  Continued thinning of the lamina using the bur.  Removal of thick chunks of ligament once the operative microscope was draped and brought in.  Decompression down to the dura.  Dura was protected with patties.  There were hypertrophic and thick chunks of ligament overhanging spurs contributing to the stenosis.  The patient had more left leg symptoms than right.  Lateral recess and foramina was opened up particularly on the left side due to overhanging spurs and chunks of ligament causing hourglass deformity of the dural sac and particularly on the left side lateral recess stenosis.  Once decompression was performed and the dural tube was round, disk was palpated.  There was firm disk  bulge present worse on the left than right.  No diskectomy was performed.  Irrigation with saline solution.  Final passes in lateral gutter to remove any remaining chunks of ligament and some Surgiflo was placed in the gutters.  Lateral gutters were dry after 3 minutes. Surgiflo was suctioned out and then standard layered closure, #1 Vicryl in the deep fascia, 2-0 Vicryl in subcutaneous tissue, 4-0 Vicryl subcuticular closure.  Dermabond on the skin.  Steri-Strips, postop dressing and transferred to recovery room in stable condition. Instrument count and needle count was correct.     Mark C. Lorin Mercy, M.D.     MCY/MEDQ  D:  04/28/2015  T:  04/28/2015  Job:  [id number]

## 2015-04-28 NOTE — Brief Op Note (Signed)
04/28/2015  5:46 PM  PATIENT:  Anthony Cuevas  66 y.o. male  PRE-OPERATIVE DIAGNOSIS:  L2-3 Stenosis  POST-OPERATIVE DIAGNOSIS:  L2-3 Stenosis  PROCEDURE:  Procedure(s): L2-3 Decompression (N/A)  SURGEON:  Surgeon(s) and Role:    * Marybelle Killings, MD - Primary  PHYSICIAN ASSISTANT:   ASSISTANTS: Benjiman Core PA-C   ANESTHESIA:   local and general  EBL:  Total I/O In: 1000 [I.V.:1000] Out: 100 [Blood:100]  BLOOD ADMINISTERED:none  DRAINS: none   LOCAL MEDICATIONS USED:  MARCAINE     SPECIMEN:  No Specimen  DISPOSITION OF SPECIMEN:  N/A  COUNTS:  YES  TOURNIQUET:  * No tourniquets in log *  DICTATION: .Other Dictation: Dictation Number [id number]  PLAN OF CARE: Admit for overnight observation  PATIENT DISPOSITION:  PACU - hemodynamically stable.   Delay start of Pharmacological VTE agent (>24hrs) due to surgical blood loss or risk of bleeding: yes

## 2015-04-28 NOTE — Anesthesia Procedure Notes (Signed)
Procedure Name: Intubation Date/Time: 04/28/2015 4:25 PM Performed by: Garrison Columbus T Pre-anesthesia Checklist: Patient identified, Emergency Drugs available, Suction available and Patient being monitored Patient Re-evaluated:Patient Re-evaluated prior to inductionOxygen Delivery Method: Circle System Utilized Preoxygenation: Pre-oxygenation with 100% oxygen Intubation Type: IV induction Ventilation: Mask ventilation without difficulty and Oral airway inserted - appropriate to patient size Laryngoscope Size: Sabra Heck and 2 Grade View: Grade I Tube type: Oral Tube size: 7.5 mm Number of attempts: 1 Airway Equipment and Method: Stylet and Oral airway Placement Confirmation: ETT inserted through vocal cords under direct vision,  positive ETCO2 and breath sounds checked- equal and bilateral Secured at: 23 cm Tube secured with: Tape Dental Injury: Teeth and Oropharynx as per pre-operative assessment

## 2015-04-28 NOTE — Transfer of Care (Signed)
Immediate Anesthesia Transfer of Care Note  Patient: Anthony Cuevas  Procedure(s) Performed: Procedure(s): L2-3 Decompression (N/A)  Patient Location: PACU  Anesthesia Type:General  Level of Consciousness: awake  Airway & Oxygen Therapy: Patient Spontanous Breathing and Patient connected to nasal cannula oxygen  Post-op Assessment: Report given to RN and Patient moving all extremities X 4  Post vital signs: Reviewed and stable  Last Vitals:  Filed Vitals:   04/28/15 1308  BP: 148/72  Pulse: 93  Temp: 36.9 C  Resp: 20    Complications: No apparent anesthesia complications

## 2015-04-28 NOTE — Interval H&P Note (Signed)
History and Physical Interval Note:  04/28/2015 12:24 PM  Anthony Cuevas  has presented today for surgery, with the diagnosis of L2-3 Stenosis  The various methods of treatment have been discussed with the patient and family. After consideration of risks, benefits and other options for treatment, the patient has consented to  Procedure(s): L2-3 Decompression (N/A) as a surgical intervention .  The patient's history has been reviewed, patient examined, no change in status, stable for surgery.  I have reviewed the patient's chart and labs.  Questions were answered to the patient's satisfaction.     [name]

## 2015-04-28 NOTE — Anesthesia Preprocedure Evaluation (Addendum)
Anesthesia Evaluation  Patient identified by MRN, date of birth, ID band Patient awake    Reviewed: Allergy & Precautions, NPO status , Patient's Chart, lab work & pertinent test results  Airway Mallampati: II  TM Distance: >3 FB Neck ROM: Full    Dental  (+) Dental Advisory Given, Edentulous Upper, Poor Dentition, Loose,    Pulmonary former smoker,    breath sounds clear to auscultation       Cardiovascular hypertension, Pt. on medications  Rhythm:Regular Rate:Normal     Neuro/Psych  Headaches, PSYCHIATRIC DISORDERS Depression    GI/Hepatic GERD  ,  Endo/Other  diabetes, Type 2, Oral Hypoglycemic Agents, Insulin DependentMorbid obesity  Renal/GU      Musculoskeletal  (+) Arthritis ,   Abdominal   Peds  Hematology   Anesthesia Other Findings   Reproductive/Obstetrics                           Anesthesia Physical Anesthesia Plan  ASA: III  Anesthesia Plan: General   Post-op Pain Management:    Induction: Intravenous  Airway Management Planned: Oral ETT  Additional Equipment:   Intra-op Plan:   Post-operative Plan: Extubation in OR  Informed Consent: I have reviewed the patients History and Physical, chart, labs and discussed the procedure including the risks, benefits and alternatives for the proposed anesthesia with the patient or authorized representative who has indicated his/her understanding and acceptance.   Dental advisory given  Plan Discussed with: CRNA, Anesthesiologist and Surgeon  Anesthesia Plan Comments:         Anesthesia Quick Evaluation

## 2015-04-29 ENCOUNTER — Encounter (HOSPITAL_COMMUNITY): Payer: Self-pay

## 2015-04-29 DIAGNOSIS — M4806 Spinal stenosis, lumbar region: Secondary | ICD-10-CM | POA: Diagnosis not present

## 2015-04-29 DIAGNOSIS — F329 Major depressive disorder, single episode, unspecified: Secondary | ICD-10-CM | POA: Diagnosis not present

## 2015-04-29 DIAGNOSIS — E785 Hyperlipidemia, unspecified: Secondary | ICD-10-CM | POA: Diagnosis not present

## 2015-04-29 DIAGNOSIS — K219 Gastro-esophageal reflux disease without esophagitis: Secondary | ICD-10-CM | POA: Diagnosis not present

## 2015-04-29 DIAGNOSIS — I10 Essential (primary) hypertension: Secondary | ICD-10-CM | POA: Diagnosis not present

## 2015-04-29 DIAGNOSIS — E119 Type 2 diabetes mellitus without complications: Secondary | ICD-10-CM | POA: Diagnosis not present

## 2015-04-29 LAB — GLUCOSE, CAPILLARY: Glucose-Capillary: 169 mg/dL — ABNORMAL HIGH (ref 65–99)

## 2015-04-29 MED ORDER — OXYCODONE-ACETAMINOPHEN 5-325 MG PO TABS: 1.0000 | ORAL_TABLET | Freq: Four times a day (QID) | ORAL | Status: DC | PRN

## 2015-04-29 NOTE — Progress Notes (Signed)
Discharged to home via wheelchair accompanied by his wife. RX given and discharge instructions given. Pt understood all instructions and when to follow up with DR on return visit.

## 2015-04-29 NOTE — Discharge Instructions (Signed)
Walk daily, avoid a lot of sitting.  OK to shower.

## 2015-04-29 NOTE — Progress Notes (Signed)
Subjective: 1 Day Post-Op Procedure(s) (LRB): L2-3 Decompression (N/A) Patient reports pain as 2 on 0-10 scale."  My back is sore , my leg pain is gone"  Objective: Vital signs in last 24 hours: Temp:  [97.6 F (36.4 C)-98.5 F (36.9 C)] 98.2 F (36.8 C) (03/30 0518) Pulse Rate:  [88-104] 99 (03/30 0518) Resp:  [11-21] 18 (03/30 0518) BP: (113-166)/(60-81) 125/75 mmHg (03/30 0518) SpO2:  [94 %-99 %] 96 % (03/30 0518) Weight:  [100.699 kg (222 lb)-100.835 kg (222 lb 4.8 oz)] 100.835 kg (222 lb 4.8 oz) (03/29 2020)  Intake/Output from previous day: 03/29 0701 - 03/30 0700 In: 1920 [P.O.:920; I.V.:1000] Out: 1725 [Urine:1625; Blood:100] Intake/Output this shift:     Recent Labs  04/27/15 1016  HGB 9.1*    Recent Labs  04/27/15 1016  WBC 10.7*  RBC 4.18*  HCT 30.9*  PLT 343    Recent Labs  04/27/15 1016  NA 136  K 4.4  CL 101  CO2 23  BUN 23*  CREATININE 1.22  GLUCOSE 134*  CALCIUM 9.6    Recent Labs  04/27/15 1016  INR 1.05    Neurologically intact  Assessment/Plan: 1 Day Post-Op Procedure(s) (LRB): L2-3 Decompression (N/A) Plan:  Discharge home.   Lielle Vandervort C 04/29/2015, 7:50 AM

## 2015-05-02 ENCOUNTER — Telehealth: Payer: Self-pay | Admitting: Family Medicine

## 2015-05-02 DIAGNOSIS — R7989 Other specified abnormal findings of blood chemistry: Secondary | ICD-10-CM

## 2015-05-02 DIAGNOSIS — D649 Anemia, unspecified: Secondary | ICD-10-CM

## 2015-05-02 NOTE — Telephone Encounter (Signed)
Call pt.  He had low hgb noted on routine pre op labs.  Needs OV with labs at the OV to discuss when possible.  Not emergent, but I would like this done when feasible for patient.   Thanks.  Orders are already in.

## 2015-05-03 NOTE — Telephone Encounter (Signed)
Patient notified by telephone to keep the appointments already scheduled.

## 2015-05-03 NOTE — Telephone Encounter (Signed)
Agreed.  Thanks.  

## 2015-05-03 NOTE — Telephone Encounter (Signed)
Patient notified as instructed by telephone and verbalized understanding. Patient stated that he already has a lab appointment scheduled 05/12/15 and scheduled to see Dr. Damita Dunnings 05/19/15.  Patient stated that he will keep these appointments if okay with Dr. Damita Dunnings.

## 2015-05-04 NOTE — Discharge Summary (Signed)
Patient ID: Anthony Cuevas MRN: [id number] DOB/AGE: 64-Apr-1952 64 y.o.  Admit date: 04/28/2015 Discharge date: 05/04/2015  Admission Diagnoses:  Active Problems:   History of lumbar laminectomy for spinal cord decompression   Discharge Diagnoses:  Active Problems:   History of lumbar laminectomy for spinal cord decompression  status post Procedure(s): L2-3 Decompression  Past Medical History  Diagnosis Date  . Diabetes mellitus, type 2 ([place]) 09/1996  . Hyperlipidemia 1992  . Hypertension 05/2003  . Neck pain     chronic  . Smoker   . Depression     improved on sertraline  . Muscle atrophy     in arms from degenerative changes in neck  . GERD (gastroesophageal reflux disease) 1990  . Wears glasses   . Lumbar stenosis L2-3  . Arthritis     Surgeries: Procedure(s): L2-3 Decompression on 04/28/2015   Consultants:    Discharged Condition: Improved  Hospital Course: Anthony Cuevas is an 65 y.o. male who was admitted 04/28/2015 for operative treatment of lumbar stenosis. Patient failed conservative treatments (please see the history and physical for the specifics) and had severe unremitting pain that affects sleep, daily activities and work/hobbies. After pre-op clearance, the patient was taken to the operating room on 04/28/2015 and underwent  Procedure(s): L2-3 Decompression.    Patient was given perioperative antibiotics:  Anti-infectives    Start     Dose/Rate Route Frequency Ordered Stop   04/29/15 0000  ceFAZolin (ANCEF) IVPB 1 g/50 mL premix  Status:  Discontinued     1 g 100 mL/hr over 30 Minutes Intravenous Every 8 hours 04/28/15 2058 04/29/15 1341   04/28/15 1445  ceFAZolin (ANCEF) IVPB 2g/100 mL premix     2 g 200 mL/hr over 30 Minutes Intravenous To [facility] 04/27/15 1113 04/28/15 1632       Patient was given sequential compression devices and early ambulation to prevent DVT.   Patient benefited maximally from hospital stay and there were  no complications. At the time of discharge, the patient was urinating/moving their bowels without difficulty, tolerating a regular diet, pain is controlled with oral pain medications and they have been cleared by PT/OT.   Recent vital signs: No data found.    Recent laboratory studies: No results for input(s): WBC, HGB, HCT, PLT, NA, K, CL, CO2, BUN, CREATININE, GLUCOSE, INR, CALCIUM in the last 72 hours.  Invalid input(s): PT, 2   Discharge Medications:     Medication List    STOP taking these medications        HYDROcodone-acetaminophen 5-325 MG tablet  Commonly known as:  NORCO/VICODIN      TAKE these medications        aspirin 325 MG EC tablet  Take 1 tablet (325 mg total) by mouth daily with breakfast.     celecoxib 200 MG capsule  Commonly known as:  CELEBREX  Take 1 capsule (200 mg total) by mouth daily.     cholecalciferol 1000 units tablet  Commonly known as:  VITAMIN D  Take 5,000 Units by mouth once a week. On monday     cyclobenzaprine 10 MG tablet  Commonly known as:  FLEXERIL  Take 1 tablet (10 mg total) by mouth 2 (two) times daily as needed for muscle spasms.     glipiZIDE 2.5 MG 24 hr tablet  Commonly known as:  GLIPIZIDE XL  Take 1 tablet (2.5 mg total) by mouth daily.     INS SYRINGE/NEEDLE 1CC/28G 28G X  1/2" 1 ML Misc  Commonly known as:  B-D INS SYR MICROFINE 1CC/28G  Use to inject insulin 4 times a day.     insulin aspart 100 UNIT/ML injection  Commonly known as:  novoLOG  Inject 5 Units into the skin 3 (three) times daily with meals. If sugar>200 before meals, give 10 units. If large meal, give extra 5 units.     insulin glargine 100 UNIT/ML injection  Commonly known as:  LANTUS  INJECT 75 UNITS UNDER THE SKIN ONCE DAILY     lisinopril 20 MG tablet  Commonly known as:  PRINIVIL,ZESTRIL  Take 1 tablet (20 mg total) by mouth daily.     LUMIGAN 0.01 % Soln  Generic drug:  bimatoprost  Place 1 drop into both eyes at bedtime.      metFORMIN 1000 MG tablet  Commonly known as:  GLUCOPHAGE  Take 0.5-1 tablets (500-1,000 mg total) by mouth 2 (two) times daily with a meal.     niacin 500 MG CR tablet  Commonly known as:  NIASPAN  Take 1 tablet (500 mg total) by mouth at bedtime.     oxyCODONE-acetaminophen 5-325 MG tablet  Commonly known as:  PERCOCET/ROXICET  Take 1-2 tablets by mouth every 6 (six) hours as needed for moderate pain.     pioglitazone 45 MG tablet  Commonly known as:  ACTOS  Take 1 tablet (45 mg total) by mouth daily.     pravastatin 80 MG tablet  Commonly known as:  PRAVACHOL  Take 1 tablet (80 mg total) by mouth daily.     PRODIGY AUTOCODE TEST test strip  Generic drug:  glucose blood  Use as instructed to test blood sugar 3-4 times per day     sertraline 100 MG tablet  Commonly known as:  ZOLOFT  Take 1 tablet (100 mg total) by mouth daily.        Diagnostic Studies: Dg Chest 2 View  04/27/2015  CLINICAL DATA:  Hypertension.  Pre admit for low back pain. EXAM: CHEST  2 VIEW COMPARISON:  November 07, 2011 FINDINGS: The heart size and mediastinal contours are within normal limits. There is no focal infiltrate, pulmonary edema, or pleural effusion. The visualized skeletal structures are unremarkable. IMPRESSION: No active cardiopulmonary disease. Electronically Signed   By: Abelardo Diesel M.D.   On: 04/27/2015 10:40   Dg Lumbar Spine 2-3 Views  04/28/2015  CLINICAL DATA:  L2-L3 decompression EXAM: LUMBAR SPINE - 2-3 VIEW COMPARISON:  Three portable cross-table lateral intraoperative images of the lumbar spine are compared to MRI lumbar spine of 03/11/2015. FINDINGS: Prior MRI labeled with 5 lumbar vertebra, current images labeled accordingly. Image #1 at [id number] hours: Metallic probes via dorsal approach project dorsal to and inferior to the spinous process of L2. Disc space narrowing L2-L3. Disc space narrowing with endplate spur formation L1-L2. Scattered atherosclerotic calcifications aorta and iliac  arteries. Image #2 at [id number] hours: Metallic probes project over the spinous processes of L2 and L3. Tissue spreader present dorsal to the superior L3 level. Image #3 at 1650 hours: Curved metallic probe via dorsal approach projects dorsal to the superior endplate of L3. Tissue spreader unchanged. IMPRESSION: Intraoperative localization images of the lumbar spine as above. Electronically Signed   By: Lavonia Dana M.D.   On: 04/28/2015 17:55          Follow-up Information    Follow up with YATES,MARK C, MD In 1 week.   Specialty:  Orthopedic Surgery   Why:  he has appt on 4/4   Contact information:   [place] [address] [phone]       Discharge Plan:  discharge to home  Disposition:     Signed: Lanae Crumbly  05/04/2015, 4:08 PM

## 2015-05-08 NOTE — Anesthesia Postprocedure Evaluation (Signed)
Anesthesia Post Note  Patient: Anthony Cuevas  Procedure(s) Performed: Procedure(s) (LRB): L2-3 Decompression (N/A)  Patient location during evaluation: PACU Level of consciousness: awake Pain management: pain level controlled Vital Signs Assessment: post-procedure vital signs reviewed and stable Respiratory status: spontaneous breathing Cardiovascular status: stable Anesthetic complications: no    Last Vitals:  Filed Vitals:   04/29/15 0147 04/29/15 0518  BP: 113/64 125/75  Pulse: 104 99  Temp: 36.4 C 36.8 C  Resp: 18 18    Last Pain:  Filed Vitals:   04/29/15 0646  PainSc: 6                  EDWARDS,Lynde Ludwig

## 2015-05-12 ENCOUNTER — Other Ambulatory Visit (INDEPENDENT_AMBULATORY_CARE_PROVIDER_SITE_OTHER): Payer: Medicare Other

## 2015-05-12 DIAGNOSIS — D649 Anemia, unspecified: Secondary | ICD-10-CM

## 2015-05-12 DIAGNOSIS — R7989 Other specified abnormal findings of blood chemistry: Secondary | ICD-10-CM

## 2015-05-12 LAB — CBC WITH DIFFERENTIAL/PLATELET
BASOS PCT: 0.3 % (ref 0.0–3.0)
Basophils Absolute: 0 10*3/uL (ref 0.0–0.1)
EOS PCT: 1.7 % (ref 0.0–5.0)
Eosinophils Absolute: 0.2 10*3/uL (ref 0.0–0.7)
HCT: 27 % — ABNORMAL LOW (ref 39.0–52.0)
LYMPHS ABS: 2.7 10*3/uL (ref 0.7–4.0)
Lymphocytes Relative: 21.7 % (ref 12.0–46.0)
MCHC: 31.8 g/dL (ref 30.0–36.0)
MCV: 71.1 fl — AB (ref 78.0–100.0)
MONOS PCT: 10 % (ref 3.0–12.0)
Monocytes Absolute: 1.2 10*3/uL — ABNORMAL HIGH (ref 0.1–1.0)
NEUTROS PCT: 66.3 % (ref 43.0–77.0)
Neutro Abs: 8.2 10*3/uL — ABNORMAL HIGH (ref 1.4–7.7)
Platelets: 512 10*3/uL — ABNORMAL HIGH (ref 150.0–400.0)
RBC: 3.8 Mil/uL — AB (ref 4.22–5.81)
RDW: 19.1 % — AB (ref 11.5–15.5)
WBC: 12.3 10*3/uL — ABNORMAL HIGH (ref 4.0–10.5)

## 2015-05-12 LAB — FOLATE: Folate: 9.4 ng/mL (ref >5.9)

## 2015-05-12 LAB — VITAMIN B12: VITAMIN B 12: 259 pg/mL (ref 211–911)

## 2015-05-12 LAB — IBC PANEL
Iron: 19 ug/dL — ABNORMAL LOW (ref 42–165)
Saturation Ratios: 3.5 % — ABNORMAL LOW (ref 20.0–50.0)
Transferrin: 383 mg/dL — ABNORMAL HIGH (ref 212.0–360.0)

## 2015-05-13 ENCOUNTER — Other Ambulatory Visit: Payer: Self-pay | Admitting: Family Medicine

## 2015-05-13 DIAGNOSIS — D509 Iron deficiency anemia, unspecified: Secondary | ICD-10-CM

## 2015-05-13 MED ORDER — FERROUS SULFATE 325 (65 FE) MG PO TABS: 325.0000 mg | ORAL_TABLET | Freq: Every day | ORAL | Status: DC

## 2015-05-19 ENCOUNTER — Encounter: Payer: Self-pay | Admitting: Family Medicine

## 2015-05-19 ENCOUNTER — Ambulatory Visit (INDEPENDENT_AMBULATORY_CARE_PROVIDER_SITE_OTHER): Payer: Medicare Other | Admitting: Family Medicine

## 2015-05-19 VITALS — BP 122/68 | HR 106 | Temp 98.6°F | Ht 70.0 in | Wt 217.5 lb

## 2015-05-19 DIAGNOSIS — M545 Low back pain: Secondary | ICD-10-CM

## 2015-05-19 DIAGNOSIS — D509 Iron deficiency anemia, unspecified: Secondary | ICD-10-CM

## 2015-05-19 MED ORDER — OXYCODONE-ACETAMINOPHEN 5-325 MG PO TABS: 1.0000 | ORAL_TABLET | Freq: Four times a day (QID) | ORAL | Status: DC | PRN

## 2015-05-19 NOTE — Progress Notes (Signed)
Pre visit review using our clinic review tool, if applicable. No additional management support is needed unless otherwise documented below in the visit note.  S/p back surgery and his leg pain is some better.  Not fully resolved, and still with some L leg/foot numbness as expected.  Overall his pain is some better and he is "healing up" from his surgery.  He needed refill on oxycodone.  D/w pt about stopping nsaids and ASA in the meantime with anemia noted.  rx done for oxycodone at the [facility], given to patient.  No ADE on that med.    IDA.  Has f/u with GI pending.  Repeat CBC pending.  Noted incidentally on pre op labs.  D/w pt.  Labs from [facility] d/w pt.  No black stools before starting iron, now with darker stools likely due to iron.  H/o PUD noted.  No ABD pain, no FCNAVD.  No blood in stool.    Meds, vitals, and allergies reviewed.   ROS: See HPI.  Otherwise, noncontributory.  nad ncat Mmm Neck supple, no LA rrr ctab abd soft, not ttp Back with healing midline L spine scar Ext w/o edema Grossly motor exam wnl X4

## 2015-05-19 NOTE — Patient Instructions (Signed)
Stop the aspirin and aleve for now.  Go to the lab on the way out.  We'll contact you with your lab report. Keep the GI appointment.  Use the pain medicine as needed.  Take care.  Glad to see you.

## 2015-05-20 ENCOUNTER — Encounter: Payer: Self-pay | Admitting: Family Medicine

## 2015-05-20 DIAGNOSIS — D509 Iron deficiency anemia, unspecified: Secondary | ICD-10-CM | POA: Insufficient documentation

## 2015-05-20 LAB — CBC WITH DIFFERENTIAL/PLATELET
BASOS ABS: 0 10*3/uL (ref 0.0–0.1)
Basophils Relative: 0.2 % (ref 0.0–3.0)
EOS ABS: 0.1 10*3/uL (ref 0.0–0.7)
Eosinophils Relative: 1.7 % (ref 0.0–5.0)
HEMATOCRIT: 29.3 % — AB (ref 39.0–52.0)
HEMOGLOBIN: 9.1 g/dL — AB (ref 13.0–17.0)
LYMPHS PCT: 28.1 % (ref 12.0–46.0)
Lymphs Abs: 2.5 10*3/uL (ref 0.7–4.0)
MCHC: 31.2 g/dL (ref 30.0–36.0)
MCV: 72.7 fl — ABNORMAL LOW (ref 78.0–100.0)
Monocytes Absolute: 1 10*3/uL (ref 0.1–1.0)
Monocytes Relative: 11.4 % (ref 3.0–12.0)
Neutro Abs: 5.2 10*3/uL (ref 1.4–7.7)
Neutrophils Relative %: 58.6 % (ref 43.0–77.0)
Platelets: 406 10*3/uL — ABNORMAL HIGH (ref 150.0–400.0)
RBC: 4.03 Mil/uL — AB (ref 4.22–5.81)
RDW: 19.9 % — ABNORMAL HIGH (ref 11.5–15.5)
WBC: 8.8 10*3/uL (ref 4.0–10.5)

## 2015-05-20 NOTE — Assessment & Plan Note (Signed)
D/w pt.  Presumed slow GI loss.  Has f/u with GI pending.  Not lightheaded.  Continue iron for now.  He agrees.  Repeat CBC pending.  App help of GI.   Avoid asa and nsaids for now.   No abd sx, no pain, no BRBPR.   Okay for outpatient f/u.  He didn't require transfusion with recent surgery.

## 2015-05-20 NOTE — Assessment & Plan Note (Signed)
rx done for oxycodone, continue with that for now instead of ASA or nsaids.  No ADE on oxycodone.

## 2015-05-21 ENCOUNTER — Ambulatory Visit (INDEPENDENT_AMBULATORY_CARE_PROVIDER_SITE_OTHER): Payer: Medicare Other | Admitting: Gastroenterology

## 2015-05-21 ENCOUNTER — Encounter: Payer: Self-pay | Admitting: Gastroenterology

## 2015-05-21 VITALS — BP 130/72 | HR 92 | Ht 68.5 in | Wt 214.6 lb

## 2015-05-21 DIAGNOSIS — E119 Type 2 diabetes mellitus without complications: Secondary | ICD-10-CM | POA: Insufficient documentation

## 2015-05-21 DIAGNOSIS — Z791 Long term (current) use of non-steroidal anti-inflammatories (NSAID): Secondary | ICD-10-CM | POA: Diagnosis not present

## 2015-05-21 DIAGNOSIS — IMO0001 Reserved for inherently not codable concepts without codable children: Secondary | ICD-10-CM

## 2015-05-21 DIAGNOSIS — D509 Iron deficiency anemia, unspecified: Secondary | ICD-10-CM

## 2015-05-21 DIAGNOSIS — Z794 Long term (current) use of insulin: Secondary | ICD-10-CM

## 2015-05-21 DIAGNOSIS — Z1211 Encounter for screening for malignant neoplasm of colon: Secondary | ICD-10-CM | POA: Diagnosis not present

## 2015-05-21 NOTE — Progress Notes (Signed)
05/21/2015 Anthony Cuevas [id number] [birth year]   HISTORY OF PRESENT ILLNESS:  This is a 65 year old male who is new to our practice and was referred here by his PCP, Dr. Damita Dunnings, for evaluation regarding iron deficiency anemia. Patient has past medical history of insulin-dependent diabetes mellitus, hypertension, hyperlipidemia, history of surgery in 1984 for peptic ulcer disease, and then Nissen fundoplication in 0000000.  Hemoglobin three weeks ago found to be 9.1 g as compared to 13.8 g two years ago. Repeat hemoglobin two days ago was 9.1 g as well. Iron studies are low and he was placed on iron supplements in the form of ferrous sulfate 325 mg daily just last week. He actually denies any GI complaints including rectal bleeding. He says now that he is on iron pills his stools are dark, but were not dark prior to beginning that. He does take NSAIDs in the form of Celebrex daily and was previously using some ibuprofen as well. He's never undergone colonoscopy in the past.   Past Medical History  Diagnosis Date  . Diabetes mellitus, type 2 ([place]) 09/1996  . Hyperlipidemia 1992  . Hypertension 05/2003  . Neck pain     chronic  . Smoker   . Depression     improved on sertraline  . Muscle atrophy     in arms from degenerative changes in neck  . GERD (gastroesophageal reflux disease) 1990  . Wears glasses   . Lumbar stenosis L2-3  . Arthritis   . History of peptic ulcer disease    Past Surgical History  Procedure Laterality Date  . Stomach surgery  1984    PUD, HH  . Hemorrhoid surgery  1989  . Cervical discectomy  1997    partial  . Cervical discectomy  2000  . Scc excision  04/25/01    Dr. March Rummage  . Replacement total knee  02/14/02, 05/04/02    left- partial  . Cervical disc surgery  04/19/02    fusion, Dr. Lorin Mercy  . Mr Jodene Nam duplicate exam  inactivate    . 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 Knee arthroplasty  11/20/2011    Procedure: COMPUTER ASSISTED TOTAL KNEE ARTHROPLASTY;   Surgeon: Marybelle Killings, MD;  Location: [facility];  Service: Orthopedics;  Laterality: Left;  Conversion Left Knee Medial Uni to Total Knee Arthroplasty-Cemented  . Multiple tooth extractions    . Lumbar laminectomy/decompression microdiscectomy N/A 04/28/2015    Procedure: L2-3 Decompression;  Surgeon: Marybelle Killings, MD;  Location: [facility];  Service: Orthopedics;  Laterality: N/A;    reports that he quit smoking about 3 years ago. His smoking use included Cigarettes. He has a 15 pack-year smoking history. He has never used smokeless tobacco. He reports that he does not drink alcohol or use illicit drugs. family history includes Cancer in his maternal grandmother; Diabetes in his brother, brother, father, mother, and sister; Hypertension in his mother; Lung disease in his paternal grandfather; Stroke in his mother. There is no history of Arthritis, Prostate cancer, or Colon cancer. No Known Allergies    Outpatient Encounter Prescriptions as of 05/21/2015  Medication Sig  . celecoxib (CELEBREX) 200 MG capsule Take 1 capsule (200 mg total) by mouth daily.  . cholecalciferol (VITAMIN D) 1000 UNITS tablet Take 5,000 Units by mouth once a week. On monday  . cyclobenzaprine (FLEXERIL) 10 MG tablet Take 1 tablet (10 mg total) by mouth 2 (two) times daily as needed for muscle spasms.  . ferrous sulfate ([employer]  IRON) 325 (65 FE) MG tablet Take 1 tablet (325 mg total) by mouth daily with breakfast.  . glipiZIDE (GLIPIZIDE XL) 2.5 MG 24 hr tablet Take 1 tablet (2.5 mg total) by mouth daily.  Marland Kitchen glucose blood (PRODIGY AUTOCODE TEST) test strip Use as instructed to test blood sugar 3-4 times per day  . INS SYRINGE/NEEDLE 1CC/28G (B-D INS SYR MICROFINE 1CC/28G) 28G X 1/2" 1 ML MISC Use to inject insulin 4 times a day.  . insulin aspart (NOVOLOG) 100 UNIT/ML injection Inject 5 Units into the skin 3 (three) times daily with meals. If sugar>200 before meals, give 10 units. If large meal, give extra 5 units.  . insulin glargine  (LANTUS) 100 UNIT/ML injection INJECT 75 UNITS UNDER THE SKIN ONCE DAILY (Patient taking differently: Inject 75 Units into the skin at bedtime. INJECT 75 UNITS UNDER THE SKIN ONCE DAILY)  . lisinopril (PRINIVIL,ZESTRIL) 20 MG tablet Take 1 tablet (20 mg total) by mouth daily.  Marland Kitchen LUMIGAN 0.01 % SOLN Place 1 drop into both eyes at bedtime.   . metFORMIN (GLUCOPHAGE) 1000 MG tablet Take 0.5-1 tablets (500-1,000 mg total) by mouth 2 (two) times daily with a meal. (Patient taking differently: Take 1,000 mg by mouth 2 (two) times daily with a meal. )  . niacin (NIASPAN) 500 MG CR tablet Take 1 tablet (500 mg total) by mouth at bedtime.  Marland Kitchen oxyCODONE-acetaminophen (PERCOCET/ROXICET) 5-325 MG tablet Take 1-2 tablets by mouth every 6 (six) hours as needed for moderate pain.  . pioglitazone (ACTOS) 45 MG tablet Take 1 tablet (45 mg total) by mouth daily.  . pravastatin (PRAVACHOL) 80 MG tablet Take 1 tablet (80 mg total) by mouth daily.  . sertraline (ZOLOFT) 100 MG tablet Take 1 tablet (100 mg total) by mouth daily.   No facility-administered encounter medications on file as of 05/21/2015.     REVIEW OF SYSTEMS  : All other systems reviewed and negative except where noted in the History of Present Illness.   PHYSICAL EXAM: BP 130/72 mmHg  Pulse 92  Ht 5' 8.5" (1.74 m)  Wt 214 lb 9.6 oz (97.342 kg)  BMI 32.15 kg/m2 General: Well developed white male in no acute distress Head: Normocephalic and atraumatic Eyes:  Sclerae anicteric, conjunctiva pink. Ears: Normal auditory acuity Lungs: Clear throughout to auscultation Heart: Regular rate and rhythm Abdomen: Soft, non-distended.  Normal bowel sounds.  Non-tender.  Laparotomy scar noted from previous surgery.  Diastasis recti noted. Rectal:  Will be done at the time of colonoscopy. Musculoskeletal: Symmetrical with no gross deformities  Skin: No lesions on visible extremities Extremities: No edema  Neurological: Alert oriented x 4, grossly  non-focal Psychological:  Alert and cooperative. Normal mood and affect  ASSESSMENT AND PLAN: -IDA:  No GI symptoms or sign of overt GI bleeding. Will schedule for EGD and colonoscopy to rule out GI source, ie, malignancy, ulcer disease, etc. Continue monitoring of hemoglobin and iron supplements per PCP. -Screening for colon cancer:  Schedule colonoscopy as above. -IDDM:  Insulin will be adjusted prior to endoscopic procedure per protocol. Will resume normal dosing after procedure. -History of PUD with surgery in 1984 and then Nissen fundoplication in 0000000 -NSAID use  *The risks, benefits, and alternatives to EGD and colonoscopy were discussed with the patient and he consents to proceed.      CC:  Tonia Ghent, MD

## 2015-05-21 NOTE — Patient Instructions (Signed)

## 2015-05-21 NOTE — Progress Notes (Signed)
Thank you for sending this case to me. I have reviewed the entire note, and the outlined plan seems appropriate.   I agree with EGD/colon to rule out a source of occult GI blood loss.  The EGD will also show [place] what kind of gastric surgery he had for "ulcer". Perhaps a partial gastrectomy could have led to iron malabsorption.

## 2015-06-09 ENCOUNTER — Ambulatory Visit (INDEPENDENT_AMBULATORY_CARE_PROVIDER_SITE_OTHER)
Admission: RE | Admit: 2015-06-09 | Discharge: 2015-06-09 | Disposition: A | Discharge status: Home / Self Care | Payer: Medicare Other | Source: Ambulatory Visit | Attending: Internal Medicine | Admitting: Internal Medicine

## 2015-06-09 ENCOUNTER — Ambulatory Visit (INDEPENDENT_AMBULATORY_CARE_PROVIDER_SITE_OTHER): Payer: Medicare Other | Admitting: Internal Medicine

## 2015-06-09 ENCOUNTER — Encounter: Payer: Self-pay | Admitting: Internal Medicine

## 2015-06-09 VITALS — BP 138/80 | HR 120 | Temp 98.7°F | Wt 214.0 lb

## 2015-06-09 DIAGNOSIS — S8992XA Unspecified injury of left lower leg, initial encounter: Secondary | ICD-10-CM

## 2015-06-09 DIAGNOSIS — S99922A Unspecified injury of left foot, initial encounter: Secondary | ICD-10-CM | POA: Diagnosis not present

## 2015-06-09 DIAGNOSIS — S9032XA Contusion of left foot, initial encounter: Secondary | ICD-10-CM | POA: Diagnosis not present

## 2015-06-09 NOTE — Progress Notes (Signed)
Subjective:    Patient ID: Anthony Cuevas, male    DOB: Jul 04, 1950, 65 y.o.   MRN: [id number]  HPI  Pt presents to the clinic today with ac/o left foot and knee injury. This occurred 4 days ago. On Friday, he was mowing his lawn,and he got tangled up with the mower. He reports pain, swelling, and tingling of his left foot, and a "popping" noise at the time of injury of his left knee. He has had a left knee replacement. He reports no current pain in his left knee, but it gets "stuck" occasionally. His left foot is currently less swollen than it was, and he can bear weight for short periods of time. He reports tingling of the bottom of the foot, and a "needle" sensation with touch. He denies decrease in ROM of the ankle or toes. He has been using Celecoxib and ice to manage his symptoms. He has an apt with podiatry and orthopaedics at the end of this month.  Review of Systems  Past Medical History  Diagnosis Date  . Diabetes mellitus, type 2 ([place]) 09/1996  . Hyperlipidemia 1992  . Hypertension 05/2003  . Neck pain     chronic  . Smoker   . Depression     improved on sertraline  . Muscle atrophy     in arms from degenerative changes in neck  . GERD (gastroesophageal reflux disease) 1990  . Wears glasses   . Lumbar stenosis L2-3  . Arthritis   . History of peptic ulcer disease     Current Outpatient Prescriptions  Medication Sig Dispense Refill  . celecoxib (CELEBREX) 200 MG capsule Take 1 capsule (200 mg total) by mouth daily. 90 capsule 3  . cholecalciferol (VITAMIN D) 1000 UNITS tablet Take 5,000 Units by mouth once a week. On monday    . cyclobenzaprine (FLEXERIL) 10 MG tablet Take 1 tablet (10 mg total) by mouth 2 (two) times daily as needed for muscle spasms. 180 tablet 3  . ferrous sulfate ([employer] IRON) 325 (65 FE) MG tablet Take 1 tablet (325 mg total) by mouth daily with breakfast.    . glipiZIDE (GLIPIZIDE XL) 2.5 MG 24 hr tablet Take 1 tablet (2.5 mg total) by mouth daily.  90 tablet 3  . glucose blood (PRODIGY AUTOCODE TEST) test strip Use as instructed to test blood sugar 3-4 times per day    . INS SYRINGE/NEEDLE 1CC/28G (B-D INS SYR MICROFINE 1CC/28G) 28G X 1/2" 1 ML MISC Use to inject insulin 4 times a day. 400 each 3  . insulin aspart (NOVOLOG) 100 UNIT/ML injection Inject 5 Units into the skin 3 (three) times daily with meals. If sugar>200 before meals, give 10 units. If large meal, give extra 5 units. 30 mL 3  . insulin glargine (LANTUS) 100 UNIT/ML injection INJECT 75 UNITS UNDER THE SKIN ONCE DAILY (Patient taking differently: Inject 75 Units into the skin at bedtime. INJECT 75 UNITS UNDER THE SKIN ONCE DAILY) 7 vial 5  . lisinopril (PRINIVIL,ZESTRIL) 20 MG tablet Take 1 tablet (20 mg total) by mouth daily. 90 tablet 3  . LUMIGAN 0.01 % SOLN Place 1 drop into both eyes at bedtime.     . metFORMIN (GLUCOPHAGE) 1000 MG tablet Take 0.5-1 tablets (500-1,000 mg total) by mouth 2 (two) times daily with a meal. (Patient taking differently: Take 1,000 mg by mouth 2 (two) times daily with a meal. )    . niacin (NIASPAN) 500 MG CR tablet Take 1 tablet (  500 mg total) by mouth at bedtime. 90 tablet 3  . oxyCODONE-acetaminophen (PERCOCET/ROXICET) 5-325 MG tablet Take 1-2 tablets by mouth every 6 (six) hours as needed for moderate pain. 40 tablet 0  . pioglitazone (ACTOS) 45 MG tablet Take 1 tablet (45 mg total) by mouth daily. 90 tablet 3  . pravastatin (PRAVACHOL) 80 MG tablet Take 1 tablet (80 mg total) by mouth daily. 90 tablet 3  . sertraline (ZOLOFT) 100 MG tablet Take 1 tablet (100 mg total) by mouth daily. 90 tablet 3   No current facility-administered medications for this visit.    No Known Allergies  Family History  Problem Relation Age of Onset  . Diabetes Mother   . Hypertension Mother   . Stroke Mother   . Diabetes Sister   . Diabetes Brother   . Cancer Maternal Grandmother     breast  . Lung disease Paternal Grandfather     black lung  . Arthritis  Neg Hx   . Prostate cancer Neg Hx   . Colon cancer Neg Hx   . Diabetes Brother   . Diabetes Father     Social History   Social History  . Marital Status: Married    Spouse Name: N/A  . Number of Children: 3  . Years of Education: N/A   Occupational History  . Training and development officer, retired     Scientific laboratory technician   Social History Main Topics  . Smoking status: Former Smoker -- 0.50 packs/day for 30 years    Types: Cigarettes    Quit date: 07/31/2011  . Smokeless tobacco: Never Used  . Alcohol Use: No  . Drug Use: No  . Sexual Activity: Not on file   Other Topics Concern  . Not on file   Social History Narrative   Retired Education officer, museum, [employer], aviation [employer], [phone], no known agent orange exposure   Married 1975   3 kids    Musculoskeletal: Pt reports left foot swelling, pain, and tingling. Denies decrease in range of motion.    No other specific complaints in a complete review of systems (except as listed in HPI above).     Objective:   Physical Exam  BP 138/80 mmHg  Pulse 120  Temp(Src) 98.7 F (37.1 C) (Oral)  Wt 214 lb (97.07 kg)  SpO2 98% Wt Readings from Last 3 Encounters:  06/09/15 214 lb (97.07 kg)  05/21/15 214 lb 9.6 oz (97.342 kg)  05/19/15 217 lb 8 oz (98.657 kg)    General: Appears his stated age and in NAD. Skin: Contusions on the left anterior tibia and on the lateral dorsum of the left foot below the lateral malleolus and ending proximal to the toes. Toes appear pink and have good blood flow.  Musculoskeletal: TTP of left lateral foot. Some swelling of the lateral foot. Normal range of motion of ankles and toes. No TTP of the left knee. Full ROM of the left knee.    BMET    Component Value Date/Time   NA 136 04/27/2015 1016   K 4.4 04/27/2015 1016   CL 101 04/27/2015 1016   CO2 23 04/27/2015 1016   GLUCOSE 134* 04/27/2015 1016   BUN 23* 04/27/2015 1016   CREATININE 1.22 04/27/2015 1016   CALCIUM 9.6 04/27/2015 1016   GFRNONAA >60 04/27/2015  1016   GFRAA >60 04/27/2015 1016    Lipid Panel     Component Value Date/Time   CHOL 130 07/13/2014 0814   TRIG 144.0 07/13/2014 [id number]  HDL 33.00* 07/13/2014 0814   CHOLHDL 4 07/13/2014 0814   VLDL 28.8 07/13/2014 0814   LDLCALC 68 07/13/2014 0814    CBC    Component Value Date/Time   WBC 8.8 05/19/2015 1509   RBC 4.03* 05/19/2015 1509   HGB 9.1* 05/19/2015 1509   HCT 29.3* 05/19/2015 1509   PLT 406.0* 05/19/2015 1509   MCV 72.7* 05/19/2015 1509   MCH 21.8* 04/27/2015 1016   MCHC 31.2 05/19/2015 1509   RDW 19.9* 05/19/2015 1509   LYMPHSABS 2.5 05/19/2015 1509   MONOABS 1.0 05/19/2015 1509   EOSABS 0.1 05/19/2015 1509   BASOSABS 0.0 05/19/2015 1509    Hgb A1C Lab Results  Component Value Date   HGBA1C 8.0* 04/27/2015         Assessment & Plan:   Left  foot injury:  Left foot xray today Continue NSAID and ice Elevate as able Follow up with podiatry  Left knee injury:  No immediate concern that there is a problem with the hardware Follow up with orthopaedics  Will follow up after xray, RTC as needed

## 2015-06-09 NOTE — Progress Notes (Signed)
Pre visit review using our clinic review tool, if applicable. No additional management support is needed unless otherwise documented below in the visit note. 

## 2015-06-09 NOTE — Patient Instructions (Signed)

## 2015-06-22 ENCOUNTER — Ambulatory Visit (INDEPENDENT_AMBULATORY_CARE_PROVIDER_SITE_OTHER): Payer: Medicare Other | Admitting: Podiatry

## 2015-06-22 ENCOUNTER — Encounter: Payer: Self-pay | Admitting: Podiatry

## 2015-06-22 DIAGNOSIS — M79676 Pain in unspecified toe(s): Secondary | ICD-10-CM | POA: Diagnosis not present

## 2015-06-22 DIAGNOSIS — B351 Tinea unguium: Secondary | ICD-10-CM | POA: Diagnosis not present

## 2015-06-22 DIAGNOSIS — E119 Type 2 diabetes mellitus without complications: Secondary | ICD-10-CM | POA: Diagnosis not present

## 2015-06-22 NOTE — Progress Notes (Signed)
Patient ID: Anthony Cuevas, male   DOB: 1950-06-10, 65 y.o.   MRN: [id number] Complaint:  Visit Type: Patient returns to my office for continued preventative foot care services. Complaint: Patient states" my nails have grown long and thick and become painful to walk and wear shoes" Patient has been diagnosed with DM with no foot complications. The patient presents for preventative foot care services. No changes to ROS.  Patient admits to minjuring foot while doing the lawn.  He says his foot now is healed.  Podiatric Exam: Vascular: dorsalis pedis and posterior tibial pulses are palpable bilateral. Capillary return is immediate. Temperature gradient is WNL. Skin turgor WNL  Sensorium: Normal Semmes Weinstein monofilament test. Normal tactile sensation bilaterally. Nail Exam: Pt has thick disfigured discolored nails with subungual debris noted bilateral entire nail hallux through fifth toenails Ulcer Exam: There is no evidence of ulcer or pre-ulcerative changes or infection. Orthopedic Exam: Muscle tone and strength are WNL. No limitations in general ROM. No crepitus or effusions noted. Foot type and digits show no abnormalities. Bony prominences are unremarkable.HAV B/L Skin: No Porokeratosis. No infection or ulcers  Diagnosis:  Onychomycosis, , Pain in right toe, pain in left toes  Treatment & Plan Procedures and Treatment: Consent by patient was obtained for treatment procedures. The patient understood the discussion of treatment and procedures well. All questions were answered thoroughly reviewed. Debridement of mycotic and hypertrophic toenails, 1 through 5 bilateral and clearing of subungual debris. No ulceration, no infection noted.  Return Visit-Office Procedure: Patient instructed to return to the office for a follow up visit 3 months for continued evaluation and treatment.    Gardiner Barefoot DPM

## 2015-06-25 ENCOUNTER — Ambulatory Visit (AMBULATORY_SURGERY_CENTER): Payer: Medicare Other | Admitting: Gastroenterology

## 2015-06-25 ENCOUNTER — Encounter: Payer: Self-pay | Admitting: Gastroenterology

## 2015-06-25 VITALS — BP 114/66 | HR 86 | Temp 98.6°F | Resp 19 | Ht 68.5 in | Wt 214.0 lb

## 2015-06-25 DIAGNOSIS — E119 Type 2 diabetes mellitus without complications: Secondary | ICD-10-CM | POA: Diagnosis not present

## 2015-06-25 DIAGNOSIS — I10 Essential (primary) hypertension: Secondary | ICD-10-CM | POA: Diagnosis not present

## 2015-06-25 DIAGNOSIS — D509 Iron deficiency anemia, unspecified: Secondary | ICD-10-CM

## 2015-06-25 DIAGNOSIS — Z1211 Encounter for screening for malignant neoplasm of colon: Secondary | ICD-10-CM | POA: Diagnosis not present

## 2015-06-25 DIAGNOSIS — F329 Major depressive disorder, single episode, unspecified: Secondary | ICD-10-CM | POA: Diagnosis not present

## 2015-06-25 DIAGNOSIS — K31819 Angiodysplasia of stomach and duodenum without bleeding: Secondary | ICD-10-CM | POA: Diagnosis not present

## 2015-06-25 HISTORY — PX: COLONOSCOPY WITH ESOPHAGOGASTRODUODENOSCOPY (EGD): SHX5779

## 2015-06-25 LAB — GLUCOSE, CAPILLARY
Glucose-Capillary: 185 mg/dL — ABNORMAL HIGH (ref 65–99)
Glucose-Capillary: 175 mg/dL — ABNORMAL HIGH (ref 65–99)

## 2015-06-25 MED ORDER — SODIUM CHLORIDE 0.9 % IV SOLN: 500.0000 mL | INTRAVENOUS | Status: DC

## 2015-06-25 NOTE — Op Note (Signed)
[facility] Patient Name: Truett Liefer Procedure Date: 06/25/2015 1:15 PM MRN: [id number] Endoscopist: Mallie Mussel L. Loletha Carrow , MD Age: 65 Referring MD:  Date of Birth: 1950-07-22 Gender: Male Procedure:                Upper GI endoscopy Indications:              Unexplained iron deficiency anemia Medicines:                Monitored Anesthesia Care Procedure:                Pre-Anesthesia Assessment:                           - Prior to the procedure, a History and Physical                            was performed, and patient medications and                            allergies were reviewed. The patient's tolerance of                            previous anesthesia was also reviewed. The risks                            and benefits of the procedure and the sedation                            options and risks were discussed with the patient.                            All questions were answered, and informed consent                            was obtained. Prior Anticoagulants: The patient has                            taken no previous anticoagulant or antiplatelet                            agents. ASA Grade Assessment: II - A patient with                            mild systemic disease. After reviewing the risks                            and benefits, the patient was deemed in                            satisfactory condition to undergo the procedure.                           After obtaining informed consent, the endoscope was  passed under direct vision. Throughout the                            procedure, the patient's blood pressure, pulse, and                            oxygen saturations were monitored continuously. The                            Model GIF-[id number]) scope was introduced                            through the mouth, and advanced to the second part                            of duodenum. The upper GI endoscopy was                             accomplished without difficulty. The patient                            tolerated the procedure well. Scope In: Scope Out: Findings:                 The examined esophagus was normal.                           A single 2 mm no bleeding angiodysplastic lesion                            was found on the greater curvature of the gastric                            body.                           Evidence of a Nissen fundoplication was found in                            the cardia. The wrap appeared loose.                           The examined duodenum was normal. Complications:            No immediate complications. Estimated Blood Loss:     Estimated blood loss: none. Impression:               - Normal esophagus.                           - A single non-bleeding angiodysplastic lesion in                            the stomach.                           - A Nissen fundoplication was found.  The wrap                            appears loose.                           - Normal examined duodenum.                           - No specimens collected. Recommendation:           - Patient has a contact number available for                            emergencies. The signs and symptoms of potential                            delayed complications were discussed with the                            patient. Return to normal activities tomorrow.                            Written discharge instructions were provided to the                            patient.                           - Resume previous diet.                           - Continue present medications.                           - See the other procedure note for documentation of                            additional recommendations.                           - Continue iron therapy and follow up with PCP to                            check blood counts and iron levels. If the levels                             either do not rise appropriately with iron                            supplements, or if they do not remain normal after                            eventually stopping iron, please refer the patient  back for a small bowel video capsule study. Henry L. Loletha Carrow, MD 06/25/2015 1:46:48 PM This report has been signed electronically.

## 2015-06-25 NOTE — Op Note (Signed)
[facility] Patient Name: Anthony Cuevas Procedure Date: 06/25/2015 1:27 PM MRN: [id number] Endoscopist: Mallie Mussel L. Loletha Carrow , MD Age: 65 Referring MD:  Date of Birth: 05-02-1950 Gender: Male Procedure:                Colonoscopy Indications:              Unexplained iron deficiency anemia Medicines:                Monitored Anesthesia Care Procedure:                Pre-Anesthesia Assessment:                           - Prior to the procedure, a History and Physical                            was performed, and patient medications and                            allergies were reviewed. The patient's tolerance of                            previous anesthesia was also reviewed. The risks                            and benefits of the procedure and the sedation                            options and risks were discussed with the patient.                            All questions were answered, and informed consent                            was obtained. Prior Anticoagulants: The patient has                            taken no previous anticoagulant or antiplatelet                            agents. ASA Grade Assessment: II - A patient with                            mild systemic disease. After reviewing the risks                            and benefits, the patient was deemed in                            satisfactory condition to undergo the procedure.                           After obtaining informed consent, the colonoscope  was passed under direct vision. Throughout the                            procedure, the patient's blood pressure, pulse, and                            oxygen saturations were monitored continuously. The                            Model CF-[id number]) scope was introduced                            through the anus and advanced to the the cecum,                            identified by appendiceal orifice and ileocecal                             valve. The colonoscopy was performed without                            difficulty. The patient tolerated the procedure                            well. The quality of the bowel preparation was                            fair. The ileocecal valve, appendiceal orifice, and                            rectum were photographed. The bowel preparation                            used was Miralax. Scope In: 1:28:36 PM Scope Out: 1:40:34 PM Scope Withdrawal Time: 0 hours 9 minutes 0 seconds  Total Procedure Duration: 0 hours 11 minutes 58 seconds  Findings:                 The perianal and digital rectal examinations were                            normal.                           The entire examined colon appeared normal on direct                            and retroflexion views.                           There was evidence of a prior hemorrhoidectomy in                            the rectum. Complications:            No immediate complications. Estimated Blood Loss:  Estimated blood loss: none. Impression:               - Preparation of the colon was fair.                           - The entire examined colon is normal on direct and                            retroflexion views.                           - Hemorrhoidectomy. anastomosis.                           - No specimens collected. Recommendation:           - Patient has a contact number available for                            emergencies. The signs and symptoms of potential                            delayed complications were discussed with the                            patient. Return to normal activities tomorrow.                            Written discharge instructions were provided to the                            patient.                           - Resume previous diet.                           - Continue present medications.                           - Repeat colonoscopy in 5 years for  screening                            purposes due to suboptimal preparation.Marland Kitchen Delorse Shane L. Loletha Carrow, MD 06/25/2015 1:50:56 PM This report has been signed electronically.

## 2015-06-25 NOTE — Progress Notes (Signed)
Patient is a poor historian regarding his medications and when he took them.

## 2015-06-25 NOTE — Progress Notes (Signed)
Stable to RR 

## 2015-06-25 NOTE — Patient Instructions (Addendum)

## 2015-06-28 ENCOUNTER — Telehealth: Payer: Self-pay | Admitting: Family Medicine

## 2015-06-28 DIAGNOSIS — D509 Iron deficiency anemia, unspecified: Secondary | ICD-10-CM

## 2015-06-28 NOTE — Telephone Encounter (Signed)
Call pt.  Continue iron therapy and follow up labs here to check blood counts and iron levels (when convenient for patient). If the levels either do not rise appropriately with iron supplements, or if they do not remain normal after eventually stopping iron, we can refer him back to GI for a small bowel video capsule study. Orders for f/u iron and cbc in.  Thanks.

## 2015-06-29 ENCOUNTER — Telehealth: Payer: Self-pay

## 2015-06-29 DIAGNOSIS — M545 Low back pain: Secondary | ICD-10-CM | POA: Diagnosis not present

## 2015-06-29 NOTE — Telephone Encounter (Signed)
Patient advised.  Lab appt scheduled.  

## 2015-06-29 NOTE — Telephone Encounter (Signed)
  Follow up Call-  Call back number 06/25/2015  Post procedure Call Back phone  # [phone]  Permission to leave phone message Yes     Patient questions:  Do you have a fever, pain , or abdominal swelling? No. Pain Score  0 *  Have you tolerated food without any problems? Yes.    Have you been able to return to your normal activities? Yes.    Do you have any questions about your discharge instructions: Diet   No. Medications  No. Follow up visit  No.  Do you have questions or concerns about your Care? No.  Actions: * If pain score is 4 or above: No action needed, pain <4.

## 2015-06-30 ENCOUNTER — Other Ambulatory Visit (INDEPENDENT_AMBULATORY_CARE_PROVIDER_SITE_OTHER): Payer: Medicare Other

## 2015-06-30 DIAGNOSIS — D509 Iron deficiency anemia, unspecified: Secondary | ICD-10-CM | POA: Diagnosis not present

## 2015-06-30 LAB — CBC WITH DIFFERENTIAL/PLATELET
BASOS ABS: 0 10*3/uL (ref 0.0–0.1)
Basophils Relative: 0.5 % (ref 0.0–3.0)
EOS ABS: 0.1 10*3/uL (ref 0.0–0.7)
Eosinophils Relative: 1.3 % (ref 0.0–5.0)
HCT: 35.2 % — ABNORMAL LOW (ref 39.0–52.0)
Hemoglobin: 11.3 g/dL — ABNORMAL LOW (ref 13.0–17.0)
Lymphocytes Relative: 20.7 % (ref 12.0–46.0)
Lymphs Abs: 1.9 10*3/uL (ref 0.7–4.0)
MCHC: 32 g/dL (ref 30.0–36.0)
MCV: 77.8 fl — AB (ref 78.0–100.0)
MONO ABS: 1 10*3/uL (ref 0.1–1.0)
Monocytes Relative: 11.4 % (ref 3.0–12.0)
NEUTROS PCT: 66.1 % (ref 43.0–77.0)
Neutro Abs: 6 10*3/uL (ref 1.4–7.7)
Platelets: 335 10*3/uL (ref 150.0–400.0)
RBC: 4.52 Mil/uL (ref 4.22–5.81)
RDW: 24 % — AB (ref 11.5–15.5)
WBC: 9 10*3/uL (ref 4.0–10.5)

## 2015-06-30 LAB — IBC PANEL
IRON: 79 ug/dL (ref 42–165)
Saturation Ratios: 15.9 % — ABNORMAL LOW (ref 20.0–50.0)
Transferrin: 355 mg/dL (ref 212.0–360.0)

## 2015-07-02 ENCOUNTER — Encounter: Payer: Self-pay | Admitting: Family Medicine

## 2015-07-02 ENCOUNTER — Ambulatory Visit (INDEPENDENT_AMBULATORY_CARE_PROVIDER_SITE_OTHER): Payer: Medicare Other | Admitting: Family Medicine

## 2015-07-02 VITALS — BP 142/74 | HR 113 | Temp 98.4°F | Wt 215.8 lb

## 2015-07-02 DIAGNOSIS — M179 Osteoarthritis of knee, unspecified: Secondary | ICD-10-CM

## 2015-07-02 DIAGNOSIS — D509 Iron deficiency anemia, unspecified: Secondary | ICD-10-CM | POA: Diagnosis not present

## 2015-07-02 DIAGNOSIS — E119 Type 2 diabetes mellitus without complications: Secondary | ICD-10-CM

## 2015-07-02 DIAGNOSIS — M1712 Unilateral primary osteoarthritis, left knee: Secondary | ICD-10-CM

## 2015-07-02 MED ORDER — OXYCODONE-ACETAMINOPHEN 5-325 MG PO TABS: 1.0000 | ORAL_TABLET | Freq: Four times a day (QID) | ORAL | Status: DC | PRN

## 2015-07-02 NOTE — Patient Instructions (Signed)
Keep taking iron.  Recheck labs before a visit in about 6 weeks.  Add on otc B12 1000 mcg a day.  Update me as needed.  Take care.  Glad to see you.

## 2015-07-02 NOTE — Progress Notes (Signed)
Pre visit review using our clinic review tool, if applicable. No additional management support is needed unless otherwise documented below in the visit note.  He was stuck in the lawnmower, trying to get on and his foot slipped.  That was 3 weeks ago.  Seen shortly thereafter.  Prev OV note reviewd.  Still with tingling on the L foot, since the injury, at the ball of the left foot, slowly getting better.  Knee pain is still bothers him some.  He noted dec in muscle mass in the L quad but that is chronic.  He had to use oxycodone for OA pain prev, with the plan to avoid nsaids (other than celebrex) if at all possible due to anemia.  See below.   F/u re: anemia.  Prev GI w/u done, d/w pt.  No blood in stool.  Still with some black stools on iron.  HGB and iron level improved, d/w pt about labs.    Meds, vitals, and allergies reviewed.   ROS: Per HPI unless specifically indicated in ROS section   nad ncat rrr ctab abd soft, not ttp Ext w/o edema He has mild dec in dorsiflexion on the L foot with walking, compared to R side, but this is since the fall several weeks ago and improved in the meantime per patient.  Not a full foot drop.

## 2015-07-04 NOTE — Assessment & Plan Note (Signed)
Continue prn oxycodone in the meantime.   The pain was likely exacerbated by the prev fall.  D/w pt.  He could have contused a peripheral nerve with the foot sx noted.   Is some better in the meantime.  Reasonable to add on B12 PO, as that may accelerate the improvement with minimal risk.  He agrees.  See AVS.  Update me as needed.

## 2015-07-13 ENCOUNTER — Other Ambulatory Visit: Payer: Self-pay | Admitting: Family Medicine

## 2015-07-30 ENCOUNTER — Other Ambulatory Visit: Payer: Self-pay | Admitting: Orthopaedic Surgery

## 2015-07-30 DIAGNOSIS — M4806 Spinal stenosis, lumbar region: Secondary | ICD-10-CM | POA: Diagnosis not present

## 2015-07-30 DIAGNOSIS — M21372 Foot drop, left foot: Secondary | ICD-10-CM

## 2015-08-02 ENCOUNTER — Other Ambulatory Visit: Payer: Self-pay | Admitting: Family Medicine

## 2015-08-02 NOTE — Telephone Encounter (Signed)
Electronic refill request. Last Filled:    90 capsule 3 07/16/2014  Last office visit:   07/02/15  CPE scheduled soon.  Please advise.

## 2015-08-02 NOTE — Telephone Encounter (Signed)
Sent. Thanks.   

## 2015-08-06 ENCOUNTER — Other Ambulatory Visit: Payer: Self-pay | Admitting: Family Medicine

## 2015-08-06 ENCOUNTER — Other Ambulatory Visit (INDEPENDENT_AMBULATORY_CARE_PROVIDER_SITE_OTHER): Payer: Medicare Other

## 2015-08-06 ENCOUNTER — Ambulatory Visit (INDEPENDENT_AMBULATORY_CARE_PROVIDER_SITE_OTHER): Payer: Medicare Other

## 2015-08-06 VITALS — BP 158/80 | HR 97 | Temp 98.1°F | Ht 68.0 in | Wt 211.0 lb

## 2015-08-06 DIAGNOSIS — E119 Type 2 diabetes mellitus without complications: Secondary | ICD-10-CM

## 2015-08-06 DIAGNOSIS — Z0000 Encounter for general adult medical examination without abnormal findings: Secondary | ICD-10-CM | POA: Diagnosis not present

## 2015-08-06 DIAGNOSIS — D509 Iron deficiency anemia, unspecified: Secondary | ICD-10-CM | POA: Diagnosis not present

## 2015-08-06 LAB — CBC WITH DIFFERENTIAL/PLATELET
BASOS ABS: 0 10*3/uL (ref 0.0–0.1)
BASOS PCT: 0.5 % (ref 0.0–3.0)
EOS ABS: 0.1 10*3/uL (ref 0.0–0.7)
Eosinophils Relative: 1.9 % (ref 0.0–5.0)
HEMATOCRIT: 37.5 % — AB (ref 39.0–52.0)
HEMOGLOBIN: 12.4 g/dL — AB (ref 13.0–17.0)
LYMPHS PCT: 22.3 % (ref 12.0–46.0)
Lymphs Abs: 1.7 10*3/uL (ref 0.7–4.0)
MCHC: 32.9 g/dL (ref 30.0–36.0)
MCV: 81.9 fl (ref 78.0–100.0)
MONO ABS: 0.9 10*3/uL (ref 0.1–1.0)
Monocytes Relative: 11.5 % (ref 3.0–12.0)
Neutro Abs: 4.8 10*3/uL (ref 1.4–7.7)
Neutrophils Relative %: 63.8 % (ref 43.0–77.0)
Platelets: 297 10*3/uL (ref 150.0–400.0)
RBC: 4.58 Mil/uL (ref 4.22–5.81)
RDW: 21 % — AB (ref 11.5–15.5)
WBC: 7.5 10*3/uL (ref 4.0–10.5)

## 2015-08-06 LAB — IBC PANEL
IRON: 50 ug/dL (ref 42–165)
SATURATION RATIOS: 9.9 % — AB (ref 20.0–50.0)
TRANSFERRIN: 359 mg/dL (ref 212.0–360.0)

## 2015-08-06 LAB — HEMOGLOBIN A1C: HEMOGLOBIN A1C: 9.2 % — AB (ref 4.6–6.5)

## 2015-08-06 MED ORDER — OXYCODONE-ACETAMINOPHEN 5-325 MG PO TABS: 1.0000 | ORAL_TABLET | Freq: Four times a day (QID) | ORAL | Status: DC | PRN

## 2015-08-06 NOTE — Progress Notes (Signed)
Pre visit review using our clinic review tool, if applicable. No additional management support is needed unless otherwise documented below in the visit note. 

## 2015-08-06 NOTE — Patient Instructions (Signed)
Anthony Cuevas , Thank you for taking time to come for your Medicare Wellness Visit. I appreciate your ongoing commitment to your health goals. Please review the following plan we discussed and let me know if I can assist you in the future.   These are the goals we discussed: Goals    . Increase physical activity     Starting 08/06/2015, I will continue to walk at least 30 min 2 days per week as tolerated.        This is a list of the screening recommended for you and due dates:  Health Maintenance  Topic Date Due  . Eye exam for diabetics  10/01/2015*  . Shingles Vaccine  08/05/2016*  . Flu Shot  08/31/2015  . Complete foot exam   09/01/2015  . Hemoglobin A1C  10/28/2015  . Pneumococcal vaccine (2) 06/13/2018  . Colon Cancer Screening  06/24/2020  . Tetanus Vaccine  12/06/2021  .  Hepatitis C: One time screening is recommended by Center for Disease Control  (CDC) for  adults born from 50 through 1964.   Completed  . HIV Screening  Addressed  *Topic was postponed. The date shown is not the original due date.    Preventive Care for Adults  A healthy lifestyle and preventive care can promote health and wellness. Preventive health guidelines for adults include the following key practices.  . A routine yearly physical is a good way to check with your health care provider about your health and preventive screening. It is a chance to share any concerns and updates on your health and to receive a thorough exam.  . Visit your dentist for a routine exam and preventive care every 6 months. Brush your teeth twice a day and floss once a day. Good oral hygiene prevents tooth decay and gum disease.  . The frequency of eye exams is based on your age, health, family medical history, use  of contact lenses, and other factors. Follow your health care provider's ecommendations for frequency of eye exams.  . Eat a healthy diet. Foods like vegetables, fruits, whole grains, low-fat dairy products, and  lean protein foods contain the nutrients you need without too many calories. Decrease your intake of foods high in solid fats, added sugars, and salt. Eat the right amount of calories for you. Get information about a proper diet from your health care provider, if necessary.  . Regular physical exercise is one of the most important things you can do for your health. Most adults should get at least 150 minutes of moderate-intensity exercise (any activity that increases your heart rate and causes you to sweat) each week. In addition, most adults need muscle-strengthening exercises on 2 or more days a week.  Silver Sneakers may be a benefit available to you. To determine eligibility, you may visit the website: www.silversneakers.com or contact program at (539) 576-6619 Mon-Fri between 8AM-8PM.   . Maintain a healthy weight. The body mass index (BMI) is a screening tool to identify possible weight problems. It provides an estimate of body fat based on height and weight. Your health care provider can find your BMI and can help you achieve or maintain a healthy weight.   For adults 20 years and older: ? A BMI below 18.5 is considered underweight. ? A BMI of 18.5 to 24.9 is normal. ? A BMI of 25 to 29.9 is considered overweight. ? A BMI of 30 and above is considered obese.   . Maintain normal blood lipids and cholesterol  levels by exercising and minimizing your intake of saturated fat. Eat a balanced diet with plenty of fruit and vegetables. Blood tests for lipids and cholesterol should begin at age 53 and be repeated every 5 years. If your lipid or cholesterol levels are high, you are over 50, or you are at high risk for heart disease, you may need your cholesterol levels checked more frequently. Ongoing high lipid and cholesterol levels should be treated with medicines if diet and exercise are not working.  . If you smoke, find out from your health care provider how to quit. If you do not use tobacco,  please do not start.  . If you choose to drink alcohol, please do not consume more than 2 drinks per day. One drink is considered to be 12 ounces (355 mL) of beer, 5 ounces (148 mL) of wine, or 1.5 ounces (44 mL) of liquor.  . If you are 52-44 years old, ask your health care provider if you should take aspirin to prevent strokes.  . Use sunscreen. Apply sunscreen liberally and repeatedly throughout the day. You should seek shade when your shadow is shorter than you. Protect yourself by wearing long sleeves, pants, a wide-brimmed hat, and sunglasses year round, whenever you are outdoors.  . Once a month, do a whole body skin exam, using a mirror to look at the skin on your back. Tell your health care provider of new moles, moles that have irregular borders, moles that are larger than a pencil eraser, or moles that have changed in shape or color.

## 2015-08-06 NOTE — Progress Notes (Signed)
Subjective:   Anthony Cuevas is a 65 y.o. male who presents for Medicare Annual/Subsequent preventive examination.  Review of Systems:  N/A Cardiac Risk Factors include: advanced age (>60men, >59 women);male gender;diabetes mellitus;dyslipidemia;hypertension;obesity (BMI >30kg/m2);sedentary lifestyle     Objective:    Vitals: BP 158/80 mmHg  Pulse 97  Temp(Src) 98.1 F (36.7 C) (Oral)  Ht 5\' 8"  (1.727 m)  Wt 211 lb (95.709 kg)  BMI 32.09 kg/m2  SpO2 97%  Body mass index is 32.09 kg/(m^2).  Tobacco History  Smoking status  . Former Smoker -- 0.50 packs/day for 30 years  . Types: Cigarettes  . Quit date: 07/31/2011  Smokeless tobacco  . Never Used     Counseling given: No   Past Medical History  Diagnosis Date  . Diabetes mellitus, type 2 ([place]) 09/1996  . Hyperlipidemia 1992  . Hypertension 05/2003  . Neck pain     chronic  . Smoker   . Depression     improved on sertraline  . Muscle atrophy     in arms from degenerative changes in neck  . GERD (gastroesophageal reflux disease) 1990  . Wears glasses   . Lumbar stenosis L2-3  . Arthritis   . History of peptic ulcer disease    Past Surgical History  Procedure Laterality Date  . Stomach surgery  1984    PUD, HH  . Hemorrhoid surgery  1989  . Cervical discectomy  1997    partial  . Cervical discectomy  2000  . Scc excision  04/25/01    Dr. March Rummage  . Replacement total knee  02/14/02, 05/04/02    left- partial  . Cervical disc surgery  04/19/02    fusion, Dr. Lorin Mercy  . Mr Jodene Nam duplicate exam  inactivate    . 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 Knee arthroplasty  11/20/2011    Procedure: COMPUTER ASSISTED TOTAL KNEE ARTHROPLASTY;  Surgeon: Marybelle Killings, MD;  Location: [facility];  Service: Orthopedics;  Laterality: Left;  Conversion Left Knee Medial Uni to Total Knee Arthroplasty-Cemented  . Multiple tooth extractions    . Lumbar laminectomy/decompression microdiscectomy N/A 04/28/2015    Procedure: L2-3 Decompression;   Surgeon: Marybelle Killings, MD;  Location: [facility];  Service: Orthopedics;  Laterality: N/A;  . Colonoscopy with esophagogastroduodenoscopy (egd)  06/25/2015   Family History  Problem Relation Age of Onset  . Diabetes Mother   . Hypertension Mother   . Stroke Mother   . Diabetes Sister   . Diabetes Brother   . Cancer Maternal Grandmother     breast  . Lung disease Paternal Grandfather     black lung  . Arthritis Neg Hx   . Prostate cancer Neg Hx   . Colon cancer Neg Hx   . Diabetes Brother   . Diabetes Father    History  Sexual Activity  . Sexual Activity: No    Outpatient Encounter Prescriptions as of 08/06/2015  Medication Sig  . B-D INS SYR MICROFINE 1CC/28G 28G X 1/2" 1 ML MISC USE TO INJECT INSULIN FOUR TIMES A DAY  . celecoxib (CELEBREX) 200 MG capsule TAKE 1 CAPSULE DAILY  . cholecalciferol (VITAMIN D) 1000 UNITS tablet Take 5,000 Units by mouth once a week. On monday  . cyanocobalamin 1000 MCG tablet Take 1,000 mcg by mouth daily.  . cyclobenzaprine (FLEXERIL) 10 MG tablet Take 1 tablet (10 mg total) by mouth 2 (two) times daily as needed for muscle spasms.  . ferrous sulfate ([employer] IRON)  325 (65 FE) MG tablet Take 1 tablet (325 mg total) by mouth daily with breakfast.  . glipiZIDE (GLIPIZIDE XL) 2.5 MG 24 hr tablet Take 1 tablet (2.5 mg total) by mouth daily.  Marland Kitchen glucose blood (PRODIGY AUTOCODE TEST) test strip Use as instructed to test blood sugar 3-4 times per day  . insulin aspart (NOVOLOG) 100 UNIT/ML injection Inject 5 Units into the skin 3 (three) times daily with meals. If sugar>200 before meals, give 10 units. If large meal, give extra 5 units.  . insulin glargine (LANTUS) 100 UNIT/ML injection INJECT 75 UNITS UNDER THE SKIN ONCE DAILY (Patient taking differently: Inject 75 Units into the skin at bedtime. INJECT 75 UNITS UNDER THE SKIN ONCE DAILY)  . lisinopril (PRINIVIL,ZESTRIL) 20 MG tablet TAKE 1 TABLET DAILY  . LUMIGAN 0.01 % SOLN Place 1 drop into both eyes at bedtime.    . metFORMIN (GLUCOPHAGE) 1000 MG tablet Take 0.5-1 tablets (500-1,000 mg total) by mouth 2 (two) times daily with a meal. (Patient taking differently: Take 1,000 mg by mouth 2 (two) times daily with a meal. )  . niacin (NIASPAN) 500 MG CR tablet Take 1 tablet (500 mg total) by mouth at bedtime.  . pioglitazone (ACTOS) 45 MG tablet Take 1 tablet (45 mg total) by mouth daily.  . pravastatin (PRAVACHOL) 80 MG tablet Take 1 tablet (80 mg total) by mouth daily.  . sertraline (ZOLOFT) 100 MG tablet Take 1 tablet (100 mg total) by mouth daily.  . [DISCONTINUED] oxyCODONE-acetaminophen (PERCOCET/ROXICET) 5-325 MG tablet Take 1-2 tablets by mouth every 6 (six) hours as needed for moderate pain.   No facility-administered encounter medications on file as of 08/06/2015.    Activities of Daily Living In your present state of health, do you have any difficulty performing the following activities: 08/06/2015 04/29/2015  Hearing? N N  Vision? N Y  Difficulty concentrating or making decisions? N N  Walking or climbing stairs? N Y  Dressing or bathing? N N  Doing errands, shopping? N -  Preparing Food and eating ? N -  Using the Toilet? N -  In the past six months, have you accidently leaked urine? N -  Do you have problems with loss of bowel control? N -  Managing your Medications? N -  Managing your Finances? N -  Housekeeping or managing your Housekeeping? N -    Patient Care Team: Tonia Ghent, MD as PCP - General (Family Medicine) Marybelle Killings, MD as Consulting Physician (Orthopedic Surgery) Loralie Champagne, PA-C as Physician Assistant (Gastroenterology) Gardiner Barefoot, DPM as Consulting Physician (Podiatry)   Assessment:    Vision Screening Comments: Last vision exam in March 2016  Exercise Activities and Dietary recommendations Current Exercise Habits: Home exercise routine, Type of exercise: walking, Time (Minutes): 30, Frequency (Times/Week): 2, Weekly Exercise (Minutes/Week): 60,  Intensity: Mild, Exercise limited by: orthopedic condition(s)  Goals    . Increase physical activity     Starting 08/06/2015, I will continue to walk at least 30 min 2 days per week as tolerated.       Fall Risk Fall Risk  08/06/2015 06/12/2013  Falls in the past year? Yes No  Number falls in past yr: 1 -  Injury with Fall? Yes -  Risk Factor Category  High Fall Risk -  Follow up Falls evaluation completed -   Depression Screen PHQ 2/9 Scores 08/06/2015 06/12/2013  PHQ - 2 Score 0 0    Cognitive Testing MMSE - Mini  Mental State Exam 08/06/2015  Orientation to time 5  Orientation to Place 5  Registration 3  Attention/ Calculation 0  Recall 3  Language- name 2 objects 0  Language- repeat 1  Language- follow 3 step command 3  Language- read & follow direction 0  Write a sentence 0  Copy design 0  Total score 20   PLEASE NOTE: A Mini-Cog screen was completed. Maximum score is 20. A value of 0 denotes this part of Folstein MMSE was not completed or the patient failed this part of the Mini-Cog screening.   Mini-Cog Screening Orientation to Time - Max 5 pts Orientation to Place - Max 5 pts Registration - Max 3 pts Recall - Max 3 pts Language Repeat - Max 1 pts Language Follow 3 Step Command - Max 3 pts  Immunization History  Administered Date(s) Administered  . H1N1 01/30/2008  . Influenza Split 12/08/2010, 10/20/2011  . Influenza Whole 12/03/2008  . Influenza,inj,Quad PF,36+ Mos 12/06/2012, 12/15/2013, 11/17/2014  . Pneumococcal Polysaccharide-23 06/12/2013  . Td 09/06/2001  . Tdap 12/07/2011   Screening Tests Health Maintenance  Topic Date Due  . OPHTHALMOLOGY EXAM  10/01/2015 (Originally 04/10/2015)  . ZOSTAVAX  08/05/2016 (Originally 12/23/2011)  . INFLUENZA VACCINE  08/31/2015  . FOOT EXAM  09/01/2015  . HEMOGLOBIN A1C  10/28/2015  . PNEUMOCOCCAL POLYSACCHARIDE VACCINE (2) 06/13/2018  . COLONOSCOPY  06/24/2020  . TETANUS/TDAP  12/06/2021  . Hepatitis C Screening   Completed  . HIV Screening  Addressed      Plan:    I have personally reviewed and addressed the Medicare Annual Wellness questionnaire and have noted the following in the patient's chart:  A. Medical and social history B. Use of alcohol, tobacco or illicit drugs  C. Current medications and supplements D. Functional ability and status E.  Nutritional status F.  Physical activity G. Advance directives H. List of other physicians I.  Hospitalizations, surgeries, and ER visits in previous 12 months J.  [place] to include hearing, vision, cognitive, depression L. Referrals and appointments - none  In addition, I have reviewed and discussed with patient certain preventive protocols, quality metrics, and best practice recommendations. A written personalized care plan for preventive services as well as general preventive health recommendations were provided to patient.  See attached scanned questionnaire for additional information.   Signed,   Lindell Noe, MHA, BS, LPN Health Advisor

## 2015-08-06 NOTE — Progress Notes (Signed)
rx done for patient

## 2015-08-06 NOTE — Progress Notes (Signed)
PCP notes:  Health maintenance:  Shingles - postponed/insurance Eye exam - pt will schedule appt after CPE  Abnormal screenings:   Fall risk - hx of fall with injury  Patient concerns: None  Nurse concerns: None  Next PCP appt: 08/16/2015 @ 1400   I reviewed health advisor's note, was available for consultation on the day of service listed in this note, and agree with documentation and plan. Elsie Stain, MD.

## 2015-08-08 ENCOUNTER — Ambulatory Visit
Admission: RE | Admit: 2015-08-08 | Discharge: 2015-08-08 | Disposition: A | Discharge status: Home / Self Care | Payer: Medicare Other | Source: Ambulatory Visit | Attending: Orthopaedic Surgery | Admitting: Orthopaedic Surgery

## 2015-08-08 DIAGNOSIS — M5126 Other intervertebral disc displacement, lumbar region: Secondary | ICD-10-CM | POA: Diagnosis not present

## 2015-08-08 DIAGNOSIS — M21372 Foot drop, left foot: Secondary | ICD-10-CM

## 2015-08-16 ENCOUNTER — Ambulatory Visit (INDEPENDENT_AMBULATORY_CARE_PROVIDER_SITE_OTHER): Payer: Medicare Other | Admitting: Family Medicine

## 2015-08-16 ENCOUNTER — Encounter: Payer: Self-pay | Admitting: Family Medicine

## 2015-08-16 ENCOUNTER — Ambulatory Visit: Payer: Medicare Other | Admitting: Family Medicine

## 2015-08-16 ENCOUNTER — Other Ambulatory Visit: Payer: Self-pay | Admitting: Family Medicine

## 2015-08-16 VITALS — BP 116/66 | HR 103 | Temp 98.4°F | Ht 69.0 in | Wt 210.8 lb

## 2015-08-16 DIAGNOSIS — D649 Anemia, unspecified: Secondary | ICD-10-CM

## 2015-08-16 DIAGNOSIS — F32A Depression, unspecified: Secondary | ICD-10-CM

## 2015-08-16 DIAGNOSIS — E1165 Type 2 diabetes mellitus with hyperglycemia: Secondary | ICD-10-CM

## 2015-08-16 DIAGNOSIS — Z125 Encounter for screening for malignant neoplasm of prostate: Secondary | ICD-10-CM | POA: Diagnosis not present

## 2015-08-16 DIAGNOSIS — E785 Hyperlipidemia, unspecified: Secondary | ICD-10-CM

## 2015-08-16 DIAGNOSIS — E119 Type 2 diabetes mellitus without complications: Secondary | ICD-10-CM | POA: Diagnosis not present

## 2015-08-16 DIAGNOSIS — D509 Iron deficiency anemia, unspecified: Secondary | ICD-10-CM

## 2015-08-16 DIAGNOSIS — Z794 Long term (current) use of insulin: Secondary | ICD-10-CM

## 2015-08-16 DIAGNOSIS — M545 Low back pain: Secondary | ICD-10-CM

## 2015-08-16 DIAGNOSIS — I10 Essential (primary) hypertension: Secondary | ICD-10-CM

## 2015-08-16 DIAGNOSIS — F329 Major depressive disorder, single episode, unspecified: Secondary | ICD-10-CM

## 2015-08-16 DIAGNOSIS — IMO0001 Reserved for inherently not codable concepts without codable children: Secondary | ICD-10-CM

## 2015-08-16 MED ORDER — SERTRALINE HCL 50 MG PO TABS: 50.0000 mg | ORAL_TABLET | Freq: Every day | ORAL | Status: DC

## 2015-08-16 MED ORDER — CYCLOBENZAPRINE HCL 10 MG PO TABS: 10.0000 mg | ORAL_TABLET | Freq: Two times a day (BID) | ORAL | Status: DC | PRN

## 2015-08-16 MED ORDER — METFORMIN HCL 1000 MG PO TABS: 1000.0000 mg | ORAL_TABLET | Freq: Two times a day (BID) | ORAL | Status: DC

## 2015-08-16 MED ORDER — OXYCODONE-ACETAMINOPHEN 5-325 MG PO TABS: 1.0000 | ORAL_TABLET | Freq: Four times a day (QID) | ORAL | Status: DC | PRN

## 2015-08-16 MED ORDER — CELECOXIB 200 MG PO CAPS: 200.0000 mg | ORAL_CAPSULE | Freq: Every day | ORAL | Status: DC

## 2015-08-16 MED ORDER — NIACIN ER (ANTIHYPERLIPIDEMIC) 500 MG PO TBCR: 500.0000 mg | EXTENDED_RELEASE_TABLET | Freq: Every day | ORAL | Status: DC

## 2015-08-16 MED ORDER — INSULIN ASPART 100 UNIT/ML ~~LOC~~ SOLN: 5.0000 [IU] | Freq: Three times a day (TID) | SUBCUTANEOUS | Status: DC

## 2015-08-16 MED ORDER — GLIPIZIDE ER 2.5 MG PO TB24: 2.5000 mg | ORAL_TABLET | Freq: Every day | ORAL | Status: DC

## 2015-08-16 MED ORDER — INSULIN GLARGINE 100 UNIT/ML ~~LOC~~ SOLN: SUBCUTANEOUS | Status: DC

## 2015-08-16 MED ORDER — PRAVASTATIN SODIUM 80 MG PO TABS: 80.0000 mg | ORAL_TABLET | Freq: Every day | ORAL | Status: DC

## 2015-08-16 MED ORDER — PIOGLITAZONE HCL 45 MG PO TABS: 45.0000 mg | ORAL_TABLET | Freq: Every day | ORAL | Status: DC

## 2015-08-16 MED ORDER — LISINOPRIL 20 MG PO TABS: 20.0000 mg | ORAL_TABLET | Freq: Every day | ORAL | Status: DC

## 2015-08-16 NOTE — Assessment & Plan Note (Signed)
Controlled, no change in med.  He agrees.

## 2015-08-16 NOTE — Assessment & Plan Note (Signed)
Diet has been off.  A1c up, d/w pt.  Needs more attention to diet, recheck in about 3 months.  He agrees.  Has f/u with podiatry pending.

## 2015-08-16 NOTE — Patient Instructions (Signed)
Cut the sertraline back to 50mg  a day for now.  If you notice mood changes, then go back to 100mg  a day.  Recheck in about 3 months, labs ahead of time.  Work on [employer] in the meantime.  Take care.  Glad to see you.

## 2015-08-16 NOTE — Assessment & Plan Note (Signed)
Continue iron, recheck labs in about 3 months.  If much better, consider stopping iron.  Can refer back to GI if not better or if levels drop again.

## 2015-08-16 NOTE — Assessment & Plan Note (Signed)
Improved with sertraline.  No ADE on med.  He wanted to try to wean off med, since his mood was better.  See AVS and med list, dec sertraline to 50mg  a day.

## 2015-08-16 NOTE — Progress Notes (Signed)
Pre visit review using our clinic review tool, if applicable. No additional management support is needed unless otherwise documented below in the visit note.  Diabetes:  Using medications without difficulties: yes Hypoglycemic episodes:no Hyperglycemic episodes:no Feet problems: less tingling after prev surgery   Blood Sugars averaging: 88 this AM, d/w pt.   But had been running higher prev.   eye exam within last year: due, d/w pt.  Has appt scheduled.  Diet has been off.  A1c up, d/w pt.    Anemia.  Black stools on iron.  hgb some better, nearly normalized.   D/w pt.   Mood.  Improved with sertraline.  No ADE on med.  He wanted to try to wean off med, since his mood was better.  D/w pt.    Pain. Dec muscle mass L quad, likely from chronic lower back pain.   Seeing Dr. Lorin Mercy in the meantime and still doing his baseline home exercises re: L leg.  Taking oxycodone prn, not every day.  No ADE on med.  It helps with the pain.    Elevated Cholesterol: Using medications without problems: yes Muscle aches: likely not from statin  Diet compliance: d/w pt.   Exercise: encouraged, as tolerated.    Hypertension:    Using medication without problems or lightheadedness: yes Chest pain with exertion:no Edema:no Short of breath:no  Prostate cancer screening and PSA options (with potential risks and benefits of testing vs not testing) were discussed along with recent recs/guidelines.  He opts in for testing PSA with next set of labs.  PMH and SH reviewed  Meds, vitals, and allergies reviewed.   ROS: Per HPI unless specifically indicated in ROS section   GEN: nad, alert and oriented HEENT: mucous membranes moist NECK: supple w/o LA CV: rrr. PULM: ctab, no inc wob ABD: soft, +bs EXT: no edema SKIN: no acute rash Dec muscle mass L quad, likely from chronic lower back pain.    Diabetic foot exam: Normal inspection No skin breakdown No calluses  Normal DP pulses Normal sensation to  light touch and monofilament Nails thickened (he has f/u with podiatry clinic pending)

## 2015-08-17 DIAGNOSIS — Z125 Encounter for screening for malignant neoplasm of prostate: Secondary | ICD-10-CM | POA: Insufficient documentation

## 2015-08-17 NOTE — Assessment & Plan Note (Signed)
Dec muscle mass L quad, likely from chronic lower back pain.   Seeing Dr. Lorin Mercy in the meantime and still doing his baseline home exercises re: L leg.  Taking oxycodone prn, not every day.  No ADE on med.  It helps with the pain.  Continue as is with meds and exercise.  He agrees.

## 2015-08-17 NOTE — Assessment & Plan Note (Signed)
Prostate cancer screening and PSA options (with potential risks and benefits of testing vs not testing) were discussed along with recent recs/guidelines.  He opts in for testing PSA with next set of labs.

## 2015-08-17 NOTE — Assessment & Plan Note (Signed)
Continue statin, recheck lipids with next lab panel.  He agrees.

## 2015-08-24 DIAGNOSIS — M4806 Spinal stenosis, lumbar region: Secondary | ICD-10-CM | POA: Diagnosis not present

## 2015-08-31 LAB — HM DIABETES EYE EXAM

## 2015-09-08 DIAGNOSIS — E119 Type 2 diabetes mellitus without complications: Secondary | ICD-10-CM | POA: Diagnosis not present

## 2015-09-21 ENCOUNTER — Ambulatory Visit (INDEPENDENT_AMBULATORY_CARE_PROVIDER_SITE_OTHER): Payer: Medicare Other | Admitting: Podiatry

## 2015-09-21 ENCOUNTER — Encounter: Payer: Self-pay | Admitting: Podiatry

## 2015-09-21 DIAGNOSIS — B351 Tinea unguium: Secondary | ICD-10-CM | POA: Diagnosis not present

## 2015-09-21 DIAGNOSIS — M79676 Pain in unspecified toe(s): Secondary | ICD-10-CM | POA: Diagnosis not present

## 2015-09-21 DIAGNOSIS — E119 Type 2 diabetes mellitus without complications: Secondary | ICD-10-CM | POA: Diagnosis not present

## 2015-09-21 NOTE — Progress Notes (Signed)
Patient ID: Anthony Cuevas, male   DOB: 01-18-1951, 65 y.o.   MRN: [id number] Complaint:  Visit Type: Patient returns to my office for continued preventative foot care services. Complaint: Patient states" my nails have grown long and thick and become painful to walk and wear shoes" Patient has been diagnosed with DM with no foot complications. The patient presents for preventative foot care services. No changes to ROS.  Patient admits to minjuring foot while doing the lawn.  He says his foot now is healed.  Podiatric Exam: Vascular: dorsalis pedis and posterior tibial pulses are palpable bilateral. Capillary return is immediate. Temperature gradient is WNL. Skin turgor WNL  Sensorium: Normal Semmes Weinstein monofilament test. Normal tactile sensation bilaterally. Nail Exam: Pt has thick disfigured discolored nails with subungual debris noted bilateral entire nail hallux through fifth toenails Ulcer Exam: There is no evidence of ulcer or pre-ulcerative changes or infection. Orthopedic Exam: Muscle tone and strength are WNL. No limitations in general ROM. No crepitus or effusions noted. Foot type and digits show no abnormalities. Bony prominences are unremarkable.HAV B/L Skin: No Porokeratosis. No infection or ulcers  Diagnosis:  Onychomycosis, , Pain in right toe, pain in left toes  Treatment & Plan Procedures and Treatment: Consent by patient was obtained for treatment procedures. The patient understood the discussion of treatment and procedures well. All questions were answered thoroughly reviewed. Debridement of mycotic and hypertrophic toenails, 1 through 5 bilateral and clearing of subungual debris. No ulceration, no infection noted.  Return Visit-Office Procedure: Patient instructed to return to the office for a follow up visit 3 months for continued evaluation and treatment.    Gardiner Barefoot DPM

## 2015-11-10 ENCOUNTER — Other Ambulatory Visit (INDEPENDENT_AMBULATORY_CARE_PROVIDER_SITE_OTHER): Payer: Medicare Other

## 2015-11-10 DIAGNOSIS — Z125 Encounter for screening for malignant neoplasm of prostate: Secondary | ICD-10-CM

## 2015-11-10 DIAGNOSIS — D509 Iron deficiency anemia, unspecified: Secondary | ICD-10-CM | POA: Diagnosis not present

## 2015-11-10 DIAGNOSIS — E119 Type 2 diabetes mellitus without complications: Secondary | ICD-10-CM | POA: Diagnosis not present

## 2015-11-10 DIAGNOSIS — D649 Anemia, unspecified: Secondary | ICD-10-CM | POA: Diagnosis not present

## 2015-11-10 LAB — CBC WITH DIFFERENTIAL/PLATELET
BASOS ABS: 0 10*3/uL (ref 0.0–0.1)
Basophils Relative: 0.6 % (ref 0.0–3.0)
Eosinophils Absolute: 0.1 10*3/uL (ref 0.0–0.7)
Eosinophils Relative: 2.1 % (ref 0.0–5.0)
HEMATOCRIT: 38.2 % — AB (ref 39.0–52.0)
Hemoglobin: 12.8 g/dL — ABNORMAL LOW (ref 13.0–17.0)
LYMPHS PCT: 24.5 % (ref 12.0–46.0)
Lymphs Abs: 1.7 10*3/uL (ref 0.7–4.0)
MCHC: 33.5 g/dL (ref 30.0–36.0)
MCV: 92.1 fl (ref 78.0–100.0)
MONOS PCT: 11.1 % (ref 3.0–12.0)
Monocytes Absolute: 0.8 10*3/uL (ref 0.1–1.0)
NEUTROS ABS: 4.2 10*3/uL (ref 1.4–7.7)
Neutrophils Relative %: 61.7 % (ref 43.0–77.0)
PLATELETS: 246 10*3/uL (ref 150.0–400.0)
RBC: 4.14 Mil/uL — ABNORMAL LOW (ref 4.22–5.81)
RDW: 15.2 % (ref 11.5–15.5)
WBC: 6.8 10*3/uL (ref 4.0–10.5)

## 2015-11-10 LAB — LIPID PANEL
Cholesterol: 179 mg/dL (ref 0–200)
HDL: 35.5 mg/dL — ABNORMAL LOW (ref >39.00)
NonHDL: 143.46
Total CHOL/HDL Ratio: 5
Triglycerides: 224 mg/dL — ABNORMAL HIGH (ref 0.0–149.0)
VLDL: 44.8 mg/dL — ABNORMAL HIGH (ref 0.0–40.0)

## 2015-11-10 LAB — LDL CHOLESTEROL, DIRECT: LDL DIRECT: 111 mg/dL

## 2015-11-10 LAB — HEMOGLOBIN A1C: HEMOGLOBIN A1C: 8.4 % — AB (ref 4.6–6.5)

## 2015-11-10 LAB — PSA, MEDICARE: PSA: 1.73 ng/ml (ref 0.10–4.00)

## 2015-11-10 LAB — IRON: Iron: 175 ug/dL — ABNORMAL HIGH (ref 42–165)

## 2015-11-16 ENCOUNTER — Ambulatory Visit (INDEPENDENT_AMBULATORY_CARE_PROVIDER_SITE_OTHER): Payer: Medicare Other | Admitting: Family Medicine

## 2015-11-16 ENCOUNTER — Encounter: Payer: Self-pay | Admitting: Family Medicine

## 2015-11-16 VITALS — BP 130/68 | HR 93 | Temp 98.2°F | Wt 218.2 lb

## 2015-11-16 DIAGNOSIS — F32A Depression, unspecified: Secondary | ICD-10-CM

## 2015-11-16 DIAGNOSIS — E119 Type 2 diabetes mellitus without complications: Secondary | ICD-10-CM

## 2015-11-16 DIAGNOSIS — R972 Elevated prostate specific antigen [PSA]: Secondary | ICD-10-CM

## 2015-11-16 DIAGNOSIS — D509 Iron deficiency anemia, unspecified: Secondary | ICD-10-CM

## 2015-11-16 DIAGNOSIS — D649 Anemia, unspecified: Secondary | ICD-10-CM | POA: Diagnosis not present

## 2015-11-16 DIAGNOSIS — Z794 Long term (current) use of insulin: Secondary | ICD-10-CM

## 2015-11-16 DIAGNOSIS — E1165 Type 2 diabetes mellitus with hyperglycemia: Secondary | ICD-10-CM

## 2015-11-16 DIAGNOSIS — Z23 Encounter for immunization: Secondary | ICD-10-CM | POA: Diagnosis not present

## 2015-11-16 DIAGNOSIS — M545 Low back pain: Secondary | ICD-10-CM

## 2015-11-16 DIAGNOSIS — IMO0001 Reserved for inherently not codable concepts without codable children: Secondary | ICD-10-CM

## 2015-11-16 DIAGNOSIS — F329 Major depressive disorder, single episode, unspecified: Secondary | ICD-10-CM

## 2015-11-16 MED ORDER — OXYCODONE-ACETAMINOPHEN 5-325 MG PO TABS: 1.0000 | ORAL_TABLET | Freq: Four times a day (QID) | ORAL | 0 refills | Status: DC | PRN

## 2015-11-16 NOTE — Assessment & Plan Note (Signed)
Mood is good, off sertraline.  He wanted to stay off medicine and continue as is. This is reasonable.

## 2015-11-16 NOTE — Progress Notes (Signed)
Diabetes:  Using medications without difficulties:yes Hypoglycemic episodes:no Hyperglycemic episodes:no Feet problems: some numbness on the L foot, less bothersome then prev, some better recently.  Blood Sugars averaging: 130 prior to and 150 after breakfast this AM eye exam within last year: yes, 2 months ago, August 2017 Compliant with meds.    Mood is good, off sertraline.  He wanted to stay off medicine and continue as is. This is reasonable.    Back pain, chronic lower back pain.  Is still doing his baseline home exercises re: L leg.  Taking oxycodone prn, not every day.  No ADE on med.  It helps with the pain.   refill done at [facility].    Anemia, h/o low iron. Iron now high.  See plan.  Labs d/w pt. No blood loss known.   PSA wnl, d/w pt.    PMH and SH reviewed  ROS: Per HPI unless specifically indicated in ROS section   Meds, vitals, and allergies reviewed.   GEN: nad, alert and oriented HEENT: mucous membranes moist NECK: supple w/o LA CV: rrr. PULM: ctab, no inc wob ABD: soft, +bs EXT: no edema  Diabetic foot exam: Normal inspection No skin breakdown No calluses  Normal DP pulses Normal sensation to light touch and monofilament on R foot, dec monofilament sensation on the L foot distally.  Nails thick.

## 2015-11-16 NOTE — Assessment & Plan Note (Signed)
A1c improved, continue work on diet and weight and recheck in a few months.  Labs d/w pt.  He agrees.

## 2015-11-16 NOTE — Patient Instructions (Signed)
Stop iron.  Recheck labs in about 3 months, prior to visit.  Update me as needed.  Take care.  Glad to see you.

## 2015-11-16 NOTE — Assessment & Plan Note (Signed)
Continue prn oxycodone.  No ADE on med.  rx done at [facility].

## 2015-11-16 NOTE — Assessment & Plan Note (Signed)
Will stop iron.  Recheck with next set of labs.  Can refer back to GI if not better or if levels drop again.  Prev w/u w/o acute bleed.  He agrees.

## 2015-11-16 NOTE — Progress Notes (Signed)
Pre visit review using our clinic review tool, if applicable. No additional management support is needed unless otherwise documented below in the visit note. 

## 2015-11-16 NOTE — Assessment & Plan Note (Signed)
PSA wnl.  D/w pt.   

## 2015-12-28 ENCOUNTER — Ambulatory Visit (INDEPENDENT_AMBULATORY_CARE_PROVIDER_SITE_OTHER): Payer: Medicare Other | Admitting: Podiatry

## 2015-12-28 DIAGNOSIS — B351 Tinea unguium: Secondary | ICD-10-CM

## 2015-12-28 DIAGNOSIS — E119 Type 2 diabetes mellitus without complications: Secondary | ICD-10-CM

## 2015-12-28 DIAGNOSIS — M2010 Hallux valgus (acquired), unspecified foot: Secondary | ICD-10-CM

## 2015-12-28 DIAGNOSIS — M79676 Pain in unspecified toe(s): Secondary | ICD-10-CM

## 2015-12-28 NOTE — Progress Notes (Signed)
Patient ID: BEREL FILLMORE, male   DOB: 03-22-1950, 65 y.o.   MRN: [id number] Complaint:  Visit Type: Patient returns to my office for continued preventative foot care services. Complaint: Patient states" my nails have grown long and thick and become painful to walk and wear shoes" Patient has been diagnosed with DM with no foot complications. The patient presents for preventative foot care services. No changes to ROS.  Patient admits to minjuring foot while doing the lawn.  He says his foot now is healed.  Podiatric Exam: Vascular: dorsalis pedis and posterior tibial pulses are palpable bilateral. Capillary return is immediate. Temperature gradient is WNL. Skin turgor WNL  Sensorium: Normal Semmes Weinstein monofilament test. Normal tactile sensation bilaterally. Nail Exam: Pt has thick disfigured discolored nails with subungual debris noted bilateral entire nail hallux through fifth toenails Ulcer Exam: There is no evidence of ulcer or pre-ulcerative changes or infection. Orthopedic Exam: Muscle tone and strength are WNL. No limitations in general ROM. No crepitus or effusions noted. Foot type and digits show no abnormalities. Bony prominences are unremarkable.HAV B/L Skin: No Porokeratosis. No infection or ulcers  Diagnosis:  Onychomycosis, , Pain in right toe, pain in left toes  Treatment & Plan Procedures and Treatment: Consent by patient was obtained for treatment procedures. The patient understood the discussion of treatment and procedures well. All questions were answered thoroughly reviewed. Debridement of mycotic and hypertrophic toenails, 1 through 5 bilateral and clearing of subungual debris. No ulceration, no infection noted.  Return Visit-Office Procedure: Patient instructed to return to the office for a follow up visit 3 months for continued evaluation and treatment.    Gardiner Barefoot DPM

## 2016-02-14 ENCOUNTER — Other Ambulatory Visit (INDEPENDENT_AMBULATORY_CARE_PROVIDER_SITE_OTHER): Payer: Medicare Other

## 2016-02-14 DIAGNOSIS — D649 Anemia, unspecified: Secondary | ICD-10-CM | POA: Diagnosis not present

## 2016-02-14 DIAGNOSIS — E119 Type 2 diabetes mellitus without complications: Secondary | ICD-10-CM | POA: Diagnosis not present

## 2016-02-14 LAB — CBC WITH DIFFERENTIAL/PLATELET
BASOS PCT: 0.7 % (ref 0.0–3.0)
Basophils Absolute: 0.1 10*3/uL (ref 0.0–0.1)
EOS ABS: 0.2 10*3/uL (ref 0.0–0.7)
Eosinophils Relative: 2.5 % (ref 0.0–5.0)
HCT: 43.7 % (ref 39.0–52.0)
Hemoglobin: 15.1 g/dL (ref 13.0–17.0)
Lymphocytes Relative: 27.6 % (ref 12.0–46.0)
Lymphs Abs: 2.2 10*3/uL (ref 0.7–4.0)
MCHC: 34.5 g/dL (ref 30.0–36.0)
MCV: 92.3 fl (ref 78.0–100.0)
MONO ABS: 1.1 10*3/uL — AB (ref 0.1–1.0)
Monocytes Relative: 13.6 % — ABNORMAL HIGH (ref 3.0–12.0)
NEUTROS ABS: 4.4 10*3/uL (ref 1.4–7.7)
Neutrophils Relative %: 55.6 % (ref 43.0–77.0)
Platelets: 303 10*3/uL (ref 150.0–400.0)
RBC: 4.74 Mil/uL (ref 4.22–5.81)
RDW: 13.1 % (ref 11.5–15.5)
WBC: 8 10*3/uL (ref 4.0–10.5)

## 2016-02-14 LAB — HEMOGLOBIN A1C: HEMOGLOBIN A1C: 8.5 % — AB (ref 4.6–6.5)

## 2016-02-14 LAB — IRON: IRON: 86 ug/dL (ref 42–165)

## 2016-02-17 ENCOUNTER — Ambulatory Visit: Payer: Medicare Other | Admitting: Family Medicine

## 2016-02-22 ENCOUNTER — Encounter: Payer: Self-pay | Admitting: Family Medicine

## 2016-02-22 ENCOUNTER — Ambulatory Visit (INDEPENDENT_AMBULATORY_CARE_PROVIDER_SITE_OTHER): Payer: Medicare Other | Admitting: Family Medicine

## 2016-02-22 VITALS — BP 140/80 | HR 80 | Temp 98.3°F | Wt 216.0 lb

## 2016-02-22 DIAGNOSIS — E119 Type 2 diabetes mellitus without complications: Secondary | ICD-10-CM | POA: Diagnosis not present

## 2016-02-22 DIAGNOSIS — E1165 Type 2 diabetes mellitus with hyperglycemia: Secondary | ICD-10-CM

## 2016-02-22 DIAGNOSIS — IMO0001 Reserved for inherently not codable concepts without codable children: Secondary | ICD-10-CM

## 2016-02-22 DIAGNOSIS — Z794 Long term (current) use of insulin: Secondary | ICD-10-CM | POA: Diagnosis not present

## 2016-02-22 DIAGNOSIS — Z862 Personal history of diseases of the blood and blood-forming organs and certain disorders involving the immune mechanism: Secondary | ICD-10-CM

## 2016-02-22 DIAGNOSIS — D509 Iron deficiency anemia, unspecified: Secondary | ICD-10-CM | POA: Diagnosis not present

## 2016-02-22 DIAGNOSIS — M545 Low back pain: Secondary | ICD-10-CM

## 2016-02-22 MED ORDER — OXYCODONE-ACETAMINOPHEN 5-325 MG PO TABS: 1.0000 | ORAL_TABLET | Freq: Four times a day (QID) | ORAL | 0 refills | Status: DC | PRN

## 2016-02-22 NOTE — Progress Notes (Signed)
Pre visit review using our clinic review tool, if applicable. No additional management support is needed unless otherwise documented below in the visit note. 

## 2016-02-22 NOTE — Progress Notes (Signed)
Diabetes:  Using medications without difficulties:see below Hypoglycemic episodes:no Hyperglycemic episodes: see below Feet problems: warts on the bottom of the feet, with compressive sx.   Blood Sugars averaging:see below eye exam within last year:yes He was having some nausea and wanted to change timing of some doses.  D/w pt about moving pravastatin and lisinopril to the PM.  He may need to dec metformin if not better.  D/w pt.   A1c still up.    Sugar had been up with recent cold sx, up to ~200 fasting recently.  Usually ~140 in the AMs when he is feeling well.    Hx of anemia.  Still on iron once daily, Hgb wnl, d/w pt.    Lower back pain.  Using oxycodone as little as possible.  More pain with more activity, had been building a chicken coop recently.  Not using med daily.  No ADE on med.    PMH and SH reviewed  Meds, vitals, and allergies reviewed.   ROS: Per HPI unless specifically indicated in ROS section   GEN: nad, alert and oriented HEENT: mucous membranes moist NECK: supple w/o LA CV: rrr. PULM: ctab, no inc wob ABD: soft, +bs EXT: no edema  Diabetic foot exam: Normal inspection except for nicked skin on the L 5th toe (hit area 1 week ago, doesn't appear infected, is getting better patient report) and small wart vs corn on the plantar side skin breakdown- see above Small calluses noted.  Intact DP pulses Normal sensation to light touch; normal sensation to monofilament Nails thickened.

## 2016-02-22 NOTE — Patient Instructions (Addendum)
Try moving pravastatin and lisinopril to the PM.  You may need to decrease the metformin if not better- cut back to 1/2 pill twice a day.  Okay to stop iron for now.   Recheck labs in about 3 months.  Update me as needed.  Take care.  Glad to see you.

## 2016-02-23 ENCOUNTER — Encounter: Payer: Self-pay | Admitting: Family Medicine

## 2016-02-23 NOTE — Assessment & Plan Note (Addendum)
Try moving pravastatin and lisinopril to the PM.  He may need to decrease the metformin if not better- cut back to 1/2 pill twice a day at that point.  He'll update me.  Once potential metformin issue is resolved/addressed, we'll go from there.  He agrees.  I'll await input from patient.   Dw pt about foot care.   >25 minutes spent in face to face time with patient, >50% spent in counselling or coordination of care.

## 2016-02-23 NOTE — Assessment & Plan Note (Signed)
D/w pt re labs and plan.  Okay to stop iron for now.   Recheck labs in about 3 months.

## 2016-02-23 NOTE — Assessment & Plan Note (Signed)
Using oxycodone as little as possible.  More pain with more activity, had been building a chicken coop recently.  Not using med daily.  No ADE on med.

## 2016-03-25 ENCOUNTER — Other Ambulatory Visit: Payer: Self-pay | Admitting: Family Medicine

## 2016-03-28 ENCOUNTER — Encounter: Payer: Self-pay | Admitting: Podiatry

## 2016-03-28 ENCOUNTER — Ambulatory Visit (INDEPENDENT_AMBULATORY_CARE_PROVIDER_SITE_OTHER): Payer: Medicare Other | Admitting: Podiatry

## 2016-03-28 DIAGNOSIS — B351 Tinea unguium: Secondary | ICD-10-CM | POA: Diagnosis not present

## 2016-03-28 DIAGNOSIS — E119 Type 2 diabetes mellitus without complications: Secondary | ICD-10-CM

## 2016-03-28 DIAGNOSIS — M79676 Pain in unspecified toe(s): Secondary | ICD-10-CM

## 2016-03-28 DIAGNOSIS — M2010 Hallux valgus (acquired), unspecified foot: Secondary | ICD-10-CM

## 2016-03-28 NOTE — Progress Notes (Signed)
Patient ID: Anthony Cuevas, male   DOB: 05/24/1950, 65 y.o.   MRN: [id number] Complaint:  Visit Type: Patient returns to my office for continued preventative foot care services. Complaint: Patient states" my nails have grown long and thick and become painful to walk and wear shoes" Patient has been diagnosed with DM with no foot complications. The patient presents for preventative foot care services. No changes to ROS.  Patient admits to numbness and tingling in his feet at night  Podiatric Exam: Vascular: dorsalis pedis and posterior tibial pulses are palpable bilateral. Capillary return is immediate. Temperature gradient is WNL. Skin turgor WNL  Sensorium: Diminished  Semmes Weinstein monofilament test. Normal tactile sensation bilaterally. Nail Exam: Pt has thick disfigured discolored nails with subungual debris noted bilateral entire nail hallux through fifth toenails Ulcer Exam: There is no evidence of ulcer or pre-ulcerative changes or infection. Orthopedic Exam: Muscle tone and strength are WNL. No limitations in general ROM. No crepitus or effusions noted. Foot type and digits show no abnormalities. Bony prominences are unremarkable.HAV B/L Skin: No Porokeratosis. 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 The patient understood the discussion of treatment and procedures well. All questions were answered thoroughly reviewed. Debridement of mycotic and hypertrophic toenails, 1 through 5 bilateral and clearing of subungual debris. No ulceration, no infection noted. Initiate diabetic footwear with HAV Return Visit-Office Procedure: Patient instructed to return to the office for a follow up visit 3 months for continued evaluation and treatment.    Briahna Pescador DPM 

## 2016-05-11 ENCOUNTER — Other Ambulatory Visit: Payer: Self-pay | Admitting: Family Medicine

## 2016-05-22 ENCOUNTER — Other Ambulatory Visit (INDEPENDENT_AMBULATORY_CARE_PROVIDER_SITE_OTHER): Payer: Medicare Other

## 2016-05-22 DIAGNOSIS — Z862 Personal history of diseases of the blood and blood-forming organs and certain disorders involving the immune mechanism: Secondary | ICD-10-CM | POA: Diagnosis not present

## 2016-05-22 DIAGNOSIS — E119 Type 2 diabetes mellitus without complications: Secondary | ICD-10-CM

## 2016-05-22 LAB — CBC WITH DIFFERENTIAL/PLATELET
BASOS ABS: 0 10*3/uL (ref 0.0–0.1)
Basophils Relative: 0.8 % (ref 0.0–3.0)
EOS PCT: 2.7 % (ref 0.0–5.0)
Eosinophils Absolute: 0.2 10*3/uL (ref 0.0–0.7)
HCT: 39.8 % (ref 39.0–52.0)
HEMOGLOBIN: 13.3 g/dL (ref 13.0–17.0)
LYMPHS PCT: 29 % (ref 12.0–46.0)
Lymphs Abs: 1.7 10*3/uL (ref 0.7–4.0)
MCHC: 33.5 g/dL (ref 30.0–36.0)
MCV: 90.7 fl (ref 78.0–100.0)
MONOS PCT: 11.2 % (ref 3.0–12.0)
Monocytes Absolute: 0.7 10*3/uL (ref 0.1–1.0)
Neutro Abs: 3.4 10*3/uL (ref 1.4–7.7)
Neutrophils Relative %: 56.3 % (ref 43.0–77.0)
Platelets: 252 10*3/uL (ref 150.0–400.0)
RBC: 4.38 Mil/uL (ref 4.22–5.81)
RDW: 12.8 % (ref 11.5–15.5)
WBC: 6 10*3/uL (ref 4.0–10.5)

## 2016-05-22 LAB — IBC PANEL
Iron: 67 ug/dL (ref 42–165)
Saturation Ratios: 14.4 % — ABNORMAL LOW (ref 20.0–50.0)
Transferrin: 333 mg/dL (ref 212.0–360.0)

## 2016-05-22 LAB — HEMOGLOBIN A1C: HEMOGLOBIN A1C: 9.1 % — AB (ref 4.6–6.5)

## 2016-05-28 ENCOUNTER — Other Ambulatory Visit: Payer: Self-pay | Admitting: Family Medicine

## 2016-05-30 ENCOUNTER — Other Ambulatory Visit: Payer: Self-pay | Admitting: *Deleted

## 2016-05-30 MED ORDER — METFORMIN HCL 1000 MG PO TABS: 1000.0000 mg | ORAL_TABLET | Freq: Two times a day (BID) | ORAL | 3 refills | Status: DC

## 2016-06-27 ENCOUNTER — Encounter: Payer: Self-pay | Admitting: Podiatry

## 2016-06-27 ENCOUNTER — Ambulatory Visit (INDEPENDENT_AMBULATORY_CARE_PROVIDER_SITE_OTHER): Payer: Medicare Other | Admitting: Podiatry

## 2016-06-27 DIAGNOSIS — E119 Type 2 diabetes mellitus without complications: Secondary | ICD-10-CM | POA: Diagnosis not present

## 2016-06-27 DIAGNOSIS — Q828 Other specified congenital malformations of skin: Secondary | ICD-10-CM | POA: Diagnosis not present

## 2016-06-27 DIAGNOSIS — M79676 Pain in unspecified toe(s): Secondary | ICD-10-CM | POA: Diagnosis not present

## 2016-06-27 DIAGNOSIS — B351 Tinea unguium: Secondary | ICD-10-CM

## 2016-06-27 NOTE — Progress Notes (Signed)
Patient ID: Anthony Cuevas, male   DOB: 11/20/1950, 65 y.o.   MRN: [id number] Complaint:  Visit Type: Patient returns to my office for continued preventative foot care services. Complaint: Patient states" my nails have grown long and thick and become painful to walk and wear shoes" Patient has been diagnosed with DM with no foot complications. The patient presents for preventative foot care services. No changes to ROS.  Patient admits to numbness and tingling in his feet at night  Podiatric Exam: Vascular: dorsalis pedis and posterior tibial pulses are palpable bilateral. Capillary return is immediate. Temperature gradient is WNL. Skin turgor WNL  Sensorium: Diminished  Semmes Weinstein monofilament test. Normal tactile sensation bilaterally. Nail Exam: Pt has thick disfigured discolored nails with subungual debris noted bilateral entire nail hallux through fifth toenails Ulcer Exam: There is no evidence of ulcer or pre-ulcerative changes or infection. Orthopedic Exam: Muscle tone and strength are WNL. No limitations in general ROM. No crepitus or effusions noted. Foot type and digits show no abnormalities. Bony prominences are unremarkable.HAV B/L Skin: No Porokeratosis. 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 The patient understood the discussion of treatment and procedures well. All questions were answered thoroughly reviewed. Debridement of mycotic and hypertrophic toenails, 1 through 5 bilateral and clearing of subungual debris. No ulceration, no infection noted. Initiate diabetic footwear with HAV Return Visit-Office Procedure: Patient instructed to return to the office for a follow up visit 3 months for continued evaluation and treatment.    Darriel Utter DPM 

## 2016-07-21 ENCOUNTER — Other Ambulatory Visit: Payer: Self-pay | Admitting: Family Medicine

## 2016-07-21 NOTE — Telephone Encounter (Signed)
Electronic refill request. Cyclobenzaprine Last office visit:   02/22/2016 Last Filled:    180 tablet 3 08/16/2015  Please advise.

## 2016-07-23 NOTE — Telephone Encounter (Signed)
Sent.  Thanks.  Due for office visit at the end of July with labs ahead of time.

## 2016-07-24 NOTE — Telephone Encounter (Signed)
Left detailed message on voicemail.  

## 2016-08-11 ENCOUNTER — Other Ambulatory Visit: Payer: Self-pay | Admitting: Family Medicine

## 2016-08-12 ENCOUNTER — Other Ambulatory Visit: Payer: Self-pay | Admitting: Family Medicine

## 2016-08-14 ENCOUNTER — Other Ambulatory Visit (INDEPENDENT_AMBULATORY_CARE_PROVIDER_SITE_OTHER): Payer: Medicare Other

## 2016-08-14 ENCOUNTER — Other Ambulatory Visit: Payer: Self-pay | Admitting: Family Medicine

## 2016-08-14 DIAGNOSIS — IMO0001 Reserved for inherently not codable concepts without codable children: Secondary | ICD-10-CM

## 2016-08-14 DIAGNOSIS — E1165 Type 2 diabetes mellitus with hyperglycemia: Secondary | ICD-10-CM

## 2016-08-14 DIAGNOSIS — Z794 Long term (current) use of insulin: Secondary | ICD-10-CM

## 2016-08-14 DIAGNOSIS — D509 Iron deficiency anemia, unspecified: Secondary | ICD-10-CM | POA: Diagnosis not present

## 2016-08-14 LAB — CBC WITH DIFFERENTIAL/PLATELET
Basophils Absolute: 0 10*3/uL (ref 0.0–0.1)
Basophils Relative: 0.8 % (ref 0.0–3.0)
EOS ABS: 0.1 10*3/uL (ref 0.0–0.7)
Eosinophils Relative: 2.3 % (ref 0.0–5.0)
HCT: 41 % (ref 39.0–52.0)
HEMOGLOBIN: 14 g/dL (ref 13.0–17.0)
LYMPHS PCT: 30.7 % (ref 12.0–46.0)
Lymphs Abs: 1.9 10*3/uL (ref 0.7–4.0)
MCHC: 34.2 g/dL (ref 30.0–36.0)
MCV: 90.2 fl (ref 78.0–100.0)
MONO ABS: 0.6 10*3/uL (ref 0.1–1.0)
Monocytes Relative: 9 % (ref 3.0–12.0)
Neutro Abs: 3.6 10*3/uL (ref 1.4–7.7)
Neutrophils Relative %: 57.2 % (ref 43.0–77.0)
Platelets: 268 10*3/uL (ref 150.0–400.0)
RBC: 4.54 Mil/uL (ref 4.22–5.81)
RDW: 14.2 % (ref 11.5–15.5)
WBC: 6.3 10*3/uL (ref 4.0–10.5)

## 2016-08-14 LAB — IBC PANEL
IRON: 82 ug/dL (ref 42–165)
Saturation Ratios: 15.8 % — ABNORMAL LOW (ref 20.0–50.0)
TRANSFERRIN: 371 mg/dL — AB (ref 212.0–360.0)

## 2016-08-14 LAB — HEMOGLOBIN A1C: HEMOGLOBIN A1C: 10.7 % — AB (ref 4.6–6.5)

## 2016-08-22 ENCOUNTER — Ambulatory Visit (INDEPENDENT_AMBULATORY_CARE_PROVIDER_SITE_OTHER): Payer: Medicare Other | Admitting: Family Medicine

## 2016-08-22 ENCOUNTER — Encounter: Payer: Self-pay | Admitting: Family Medicine

## 2016-08-22 DIAGNOSIS — Z794 Long term (current) use of insulin: Secondary | ICD-10-CM

## 2016-08-22 DIAGNOSIS — IMO0001 Reserved for inherently not codable concepts without codable children: Secondary | ICD-10-CM

## 2016-08-22 DIAGNOSIS — E1165 Type 2 diabetes mellitus with hyperglycemia: Secondary | ICD-10-CM

## 2016-08-22 NOTE — Progress Notes (Signed)
Diabetes:  Using medications without difficulties: yes, except see below.   Hypoglycemic episodes: no Hyperglycemic episodes: yes, likely Feet problems: L foot tingling noted, with injury noted last year.  Blood Sugars averaging: as low as 140 in the AM, 200s later in the day.  Diet has been off, d/w pt, over the last 3 months.   eye exam within last year: yes, f/u pending for next month.   Was supposed to be on novolog 5-10 units with meals but hadn't been using it.   Still on lantus 75 units per day.   A1c up, d/w pt.   Moving the timing of the lisinopril and pravastatin helped with nausea and he didn't have to dec his metformin dose.   He admits to being dejected about his sugar elevation.  No SI/HI.    Meds, vitals, and allergies reviewed.   ROS: Per HPI unless specifically indicated in ROS section   GEN: nad, alert and oriented HEENT: mucous membranes moist NECK: supple w/o LA CV: rrr. PULM: ctab, no inc wob ABD: soft, +bs EXT: no edema SKIN: no acute rash  Diabetic foot exam: Normal inspection No skin breakdown No calluses  Normal DP pulses Normal sensation to light touch and monofilament Nails thickened.

## 2016-08-22 NOTE — Assessment & Plan Note (Addendum)
A1c up, d/w pt.   Refer to endocrine.  D/ wpt.  Was supposed to be on novolog 5-10 units with meals but hadn't been using it.   D/w pt about restart of mealtime insulin.  He'll need to be testing prior to meals and then as needed.   Still on lantus 75 units per day.   Moving the timing of the lisinopril and pravastatin helped with nausea and he didn't have to dec his metformin dose.   He admits to being dejected about his sugar elevation.  No SI/HI.  He doesn't appear overtly depressed and is still okay for outpatient follow-up. I think that seeing endocrine would be useful. He agrees. I appreciate the help of all involved.

## 2016-08-22 NOTE — Patient Instructions (Signed)
Restart your mealtime insulin and get back on your diet.  Rosaria Ferries will call about your referral. Burnis Medin go from there.   Take care.  Glad to see you.

## 2016-09-08 ENCOUNTER — Other Ambulatory Visit: Payer: Self-pay | Admitting: Family Medicine

## 2016-09-12 ENCOUNTER — Other Ambulatory Visit: Payer: Self-pay | Admitting: Family Medicine

## 2016-09-19 DIAGNOSIS — E119 Type 2 diabetes mellitus without complications: Secondary | ICD-10-CM | POA: Diagnosis not present

## 2016-09-19 DIAGNOSIS — H524 Presbyopia: Secondary | ICD-10-CM | POA: Diagnosis not present

## 2016-09-19 DIAGNOSIS — H2513 Age-related nuclear cataract, bilateral: Secondary | ICD-10-CM | POA: Diagnosis not present

## 2016-09-19 DIAGNOSIS — H401134 Primary open-angle glaucoma, bilateral, indeterminate stage: Secondary | ICD-10-CM | POA: Diagnosis not present

## 2016-09-19 LAB — HM DIABETES EYE EXAM

## 2016-09-22 ENCOUNTER — Other Ambulatory Visit: Payer: Self-pay | Admitting: *Deleted

## 2016-09-25 ENCOUNTER — Telehealth: Payer: Self-pay | Admitting: Family Medicine

## 2016-09-25 NOTE — Telephone Encounter (Signed)
Patient returned Anthony Cuevas's call.  Patient said he can be reached at home or on his cell phone.

## 2016-09-26 NOTE — Telephone Encounter (Signed)
Form received from [employer] for blood glucose monitoring supplies.  Patient does want these supplies.  Form completed from patient interview except for signature and date.  Form placed in Dr. Josefine Class In [place].   Form requests last 2 chart notes and should indicate certain info highlighted on form.  Patient's last OV 08/22/16.  Please advise.

## 2016-09-27 ENCOUNTER — Encounter: Payer: Self-pay | Admitting: Podiatry

## 2016-09-27 ENCOUNTER — Ambulatory Visit (INDEPENDENT_AMBULATORY_CARE_PROVIDER_SITE_OTHER): Payer: Medicare Other | Admitting: Podiatry

## 2016-09-27 DIAGNOSIS — B351 Tinea unguium: Secondary | ICD-10-CM

## 2016-09-27 DIAGNOSIS — E119 Type 2 diabetes mellitus without complications: Secondary | ICD-10-CM

## 2016-09-27 DIAGNOSIS — M2010 Hallux valgus (acquired), unspecified foot: Secondary | ICD-10-CM

## 2016-09-27 DIAGNOSIS — M79676 Pain in unspecified toe(s): Secondary | ICD-10-CM

## 2016-09-27 NOTE — Progress Notes (Signed)
Patient ID: Anthony Cuevas, male   DOB: 12/10/1950, 66 y.o.   MRN: [id number] Complaint:  Visit Type: Patient returns to my office for continued preventative foot care services. Complaint: Patient states" my nails have grown long and thick and become painful to walk and wear shoes" Patient has been diagnosed with DM with no foot complications. The patient presents for preventative foot care services. No changes to ROS.  Patient admits to numbness and tingling in his feet at night  Podiatric Exam: Vascular: dorsalis pedis and posterior tibial pulses are palpable bilateral. Capillary return is immediate. Temperature gradient is WNL. Skin turgor WNL  Sensorium: Diminished  Semmes Weinstein monofilament test. Normal tactile sensation bilaterally. Nail Exam: Pt has thick disfigured discolored nails with subungual debris noted bilateral entire nail hallux through fifth toenails Ulcer Exam: There is no evidence of ulcer or pre-ulcerative changes or infection. Orthopedic Exam: Muscle tone and strength are WNL. No limitations in general ROM. No crepitus or effusions noted. Foot type and digits show no abnormalities. Bony prominences are unremarkable.HAV B/L Skin: No Porokeratosis. 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 The patient understood the discussion of treatment and procedures well. All questions were answered thoroughly reviewed. Debridement of mycotic and hypertrophic toenails, 1 through 5 bilateral and clearing of subungual debris. No ulceration, no infection noted. Initiate diabetic footwear with HAV Return Visit-Office Procedure: Patient instructed to return to the office for a follow up visit 3 months for continued evaluation and treatment.    Gardiner Barefoot DPM

## 2016-09-27 NOTE — Telephone Encounter (Signed)
I'll work on the hard copy.  Thanks.  

## 2016-09-28 ENCOUNTER — Encounter: Payer: Self-pay | Admitting: Endocrinology

## 2016-09-28 ENCOUNTER — Ambulatory Visit (INDEPENDENT_AMBULATORY_CARE_PROVIDER_SITE_OTHER): Payer: Medicare Other | Admitting: Endocrinology

## 2016-09-28 VITALS — BP 160/82 | HR 112 | Wt 213.0 lb

## 2016-09-28 DIAGNOSIS — F329 Major depressive disorder, single episode, unspecified: Secondary | ICD-10-CM

## 2016-09-28 DIAGNOSIS — R609 Edema, unspecified: Secondary | ICD-10-CM | POA: Diagnosis not present

## 2016-09-28 DIAGNOSIS — Z794 Long term (current) use of insulin: Secondary | ICD-10-CM | POA: Diagnosis not present

## 2016-09-28 DIAGNOSIS — E1165 Type 2 diabetes mellitus with hyperglycemia: Secondary | ICD-10-CM

## 2016-09-28 DIAGNOSIS — IMO0001 Reserved for inherently not codable concepts without codable children: Secondary | ICD-10-CM

## 2016-09-28 MED ORDER — INSULIN ASPART 100 UNIT/ML ~~LOC~~ SOLN: 15.0000 [IU] | Freq: Three times a day (TID) | SUBCUTANEOUS | 3 refills | Status: DC

## 2016-09-28 NOTE — Telephone Encounter (Signed)
Form and last 2 office notes faxed.

## 2016-09-28 NOTE — Progress Notes (Signed)
Subjective:    Patient ID: Anthony Cuevas, male    DOB: 28-Jul-1950, 66 y.o.   MRN: [id number]  HPI pt is referred by Dr Damita Dunnings, for diabetes.  Pt states DM was dx'ed in 1992; he has mild if any neuropathy of the lower extremities, but he has associated renal insuff; he has been on insulin since 2006; pt says his diet and exercise are not good; he has never had pancreatitis, pancreatic surgery, severe hypoglycemia or DKA.  He takes lantus 75/d, and prn novolog (averages approx a total of 20 units per day), and 2 oral meds.  He says cbg's vary from 125-300.  It is in general higher as the day goes on.  He says he eats multiple small meals throughout the day.   Past Medical History:  Diagnosis Date  . Arthritis   . Depression    improved on sertraline  . Diabetes mellitus, type 2 ([place]) 09/1996  . GERD (gastroesophageal reflux disease) 1990  . History of peptic ulcer disease   . Hyperlipidemia 1992  . Hypertension 05/2003  . Lumbar stenosis L2-3  . Muscle atrophy    in arms from degenerative changes in neck  . Neck pain    chronic  . Smoker   . Wears glasses     Past Surgical History:  Procedure Laterality Date  . CERVICAL DISC SURGERY  04/19/02   fusion, Dr. Lorin Mercy  . CERVICAL DISCECTOMY  1997   partial  . CERVICAL DISCECTOMY  2000  . COLONOSCOPY WITH ESOPHAGOGASTRODUODENOSCOPY (EGD)  06/25/2015  . [place]  . KNEE ARTHROPLASTY  11/20/2011   Procedure: COMPUTER ASSISTED TOTAL KNEE ARTHROPLASTY;  Surgeon: Marybelle Killings, MD;  Location: [facility];  Service: Orthopedics;  Laterality: Left;  Conversion Left Knee Medial Uni to Total Knee Arthroplasty-Cemented  . LUMBAR LAMINECTOMY/DECOMPRESSION MICRODISCECTOMY N/A 04/28/2015   Procedure: L2-3 Decompression;  Surgeon: Marybelle Killings, MD;  Location: [facility];  Service: Orthopedics;  Laterality: N/A;  . MR MRA DUPLICATE EXAM  INACTIVATE    . MULTIPLE TOOTH EXTRACTIONS    . Neisen funduplication  7209  . REPLACEMENT TOTAL KNEE   02/14/02, 05/04/02   left- partial  . SCC EXCISION  04/25/01   Dr. March Rummage  . STOMACH SURGERY  1984   PUD, [place]    Social History   Social History  . Marital status: Married    Spouse name: N/A  . Number of children: 3  . Years of education: N/A   Occupational History  . Training and development officer, retired     Scientific laboratory technician   Social History Main Topics  . Smoking status: Former Smoker    Packs/day: 0.50    Years: 30.00    Types: Cigarettes    Quit date: 07/31/2011  . Smokeless tobacco: Never Used  . Alcohol use No  . Drug use: No  . Sexual activity: No   Other Topics Concern  . 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 B-D INS SYR MICROFINE 1CC/28G 28G X 1/2" 1 ML MISC USE TO INJECT INSULIN FOUR TIMES A DAY 400 each 2  . celecoxib (CELEBREX) 200 MG capsule TAKE 1 CAPSULE DAILY 90 capsule 1  .  cholecalciferol (VITAMIN D) 1000 UNITS tablet Take 5,000 Units by mouth once a week. On monday    . cyanocobalamin 1000 MCG tablet Take 1,000 mcg by mouth daily.    . cyclobenzaprine (FLEXERIL) 10 MG tablet TAKE 1 TABLET TWICE A DAY AS NEEDED FOR MUSCLE SPASMS 180 tablet 3  . GLIPIZIDE XL 2.5 MG 24 hr tablet TAKE 1 TABLET DAILY 90 tablet 3  . glucose blood (PRODIGY AUTOCODE TEST) test strip Use as instructed to test blood sugar 3-4 times per day    . insulin glargine (LANTUS) 100 UNIT/ML injection INJECT 75 UNITS UNDER THE SKIN ONCE DAILY 7 vial 5  . lisinopril (PRINIVIL,ZESTRIL) 20 MG tablet Take 1 tablet (20 mg total) by mouth daily. 90 tablet 3  . LUMIGAN 0.01 % SOLN Place 1 drop into both eyes at bedtime.     . metFORMIN (GLUCOPHAGE) 1000 MG tablet Take 1 tablet (1,000 mg total) by mouth 2 (two) times daily with a meal. 180 tablet 3  . NIASPAN 500  MG CR tablet TAKE 1 TABLET AT BEDTIME 90 tablet 0  . oxyCODONE-acetaminophen (PERCOCET/ROXICET) 5-325 MG tablet Take 1-2 tablets by mouth every 6 (six) hours as needed for moderate pain. 40 tablet 0  . pravastatin (PRAVACHOL) 80 MG tablet TAKE 1 TABLET DAILY 90 tablet 1   No current facility-administered medications on file prior to visit.     No Known Allergies  Family History  Problem Relation Age of Onset  . Diabetes Mother   . Hypertension Mother   . Stroke Mother   . Diabetes Sister   . Diabetes Brother   . Diabetes Brother   . Heart disease Brother   . Diabetes Father   . Cancer Maternal Grandmother        breast  . Lung disease Paternal Grandfather        black lung  . Arthritis Neg Hx   . Prostate cancer Neg Hx   . Colon cancer Neg Hx     BP (!) 160/82   Pulse (!) 112   Wt 213 lb (96.6 kg)   SpO2 98%   BMI 31.45 kg/m   Review of Systems denies blurry vision, headache, chest pain, sob, n/v, urinary frequency, muscle cramps, excessive diaphoresis, depression, cold intolerance, and rhinorrhea.  He has easy bruising.  He has lost a few lbs, due to his efforts.      Objective:   Physical Exam VS: see vs page GEN: no distress HEAD: head: no deformity eyes: no periorbital swelling, no proptosis external nose and ears are normal mouth: no lesion seen NECK: supple, thyroid is not enlarged.  Old healed surgical scar at the right post neck CHEST WALL: no deformity LUNGS: clear to auscultation CV: reg rate and rhythm, no murmur ABD: abdomen is soft, nontender.  no hepatosplenomegaly.  not distended.  Large ventral hernia, and midline old healed surgical scar MUSCULOSKELETAL: muscle bulk and strength are grossly normal.  no obvious joint swelling.  gait is normal and steady EXTEMITIES: no deformity.  no ulcer on the feet.  feet are of normal color and temp.  1+ bilat leg edema.  healing surgical scar at the left knee.  There is bilateral onychomycosis of the  toenails PULSES: dorsalis pedis intact bilat.  no carotid bruit NEURO:  cn 2-12 grossly intact.   readily moves all 4's.  sensation is intact to touch on the feet SKIN:  Normal texture and temperature.  No rash or suspicious lesion is visible.  Eczematous rash at the abd RLQ. NODES:  None palpable at the neck PSYCH: alert, well-oriented.  Does not appear anxious nor depressed.   Lab Results  Component Value Date   HGBA1C 10.7 (H) 08/14/2016   I have reviewed outside records, and summarized: Pt was noted to have elevated a1c, and referred here.  He was noted to be very discouraged about DM and its rx.    I personally reviewed electrocardiogram tracing (04/06/15): Indication: preop Impression: NSR.  No MI.  No hypertrophy. Compared to 2013: no significant change  Lab Results  Component Value Date   CREATININE 1.22 04/27/2015   BUN 23 (H) 04/27/2015   NA 136 04/27/2015   K 4.4 04/27/2015   CL 101 04/27/2015   CO2 23 04/27/2015      Assessment & Plan:  Edema, prob due to pioglitizone.  Insulin-requiring type 2 DM, with renal insuff, severe exacerbation:  Depression: he may not be a candidate for ongoing multiple daily injections, but we'll continue for now.   Patient Instructions  good diet and exercise significantly improve the control of your diabetes.  please let me know if you wish to be referred to a dietician.  high blood sugar is very risky to your health.  you should see an eye doctor and dentist every year.  It is very important to get all recommended vaccinations.  Controlling your blood pressure and cholesterol drastically reduces the damage diabetes does to your body.  Those who smoke should quit.  Please discuss these with your doctor.  check your blood sugar 4 times a day.  vary the time of day when you check, between before the 3 meals, and at bedtime.  also check if you have symptoms of your blood sugar being too high or too low.  please keep a record of the readings  and bring it to your next appointment here (or you can bring the meter itself).  You can write it on any piece of paper.  please call us sooner if your blood sugar goes below 70, or if you have a lot of readings over 200.   We will need to take this complex situation in stages For now, please:  Stop taking the pioglitizone, and:  Please continue the same lantus and metformin, and:  Increase the novolog to 15 units 3 times a day (just before each meal, no matter what your blood sugar is).   Please call or message [place] next week, to tell us how the blood sugar is doing.   I am happy to prescribe the "freestyle libre," if you want.   Please come back for a follow-up appointment in 1 month.

## 2016-09-28 NOTE — Patient Instructions (Addendum)
good diet and exercise significantly improve the control of your diabetes.  please let me know if you wish to be referred to a dietician.  high blood sugar is very risky to your health.  you should see an eye doctor and dentist every year.  It is very important to get all recommended vaccinations.  Controlling your blood pressure and cholesterol drastically reduces the damage diabetes does to your body.  Those who smoke should quit.  Please discuss these with your doctor.  check your blood sugar 4 times a day.  vary the time of day when you check, between before the 3 meals, and at bedtime.  also check if you have symptoms of your blood sugar being too high or too low.  please keep a record of the readings and bring it to your next appointment here (or you can bring the meter itself).  You can write it on any piece of paper.  please call us sooner if your blood sugar goes below 70, or if you have a lot of readings over 200.   We will need to take this complex situation in stages For now, please:  Stop taking the pioglitizone, and:  Please continue the same lantus and metformin, and:  Increase the novolog to 15 units 3 times a day (just before each meal, no matter what your blood sugar is).   Please call or message [place] next week, to tell us how the blood sugar is doing.   I am happy to prescribe the "freestyle libre," if you want.   Please come back for a follow-up appointment in 1 month.

## 2016-10-06 ENCOUNTER — Telehealth: Payer: Self-pay | Admitting: Endocrinology

## 2016-10-06 NOTE — Telephone Encounter (Signed)
Routing to you °

## 2016-10-06 NOTE — Telephone Encounter (Signed)
Patient would like the sensors and the [employer]. Call patient to advise.   [place], [place] [phone] (Phone) [phone] (Fax)   15 units at each meal with the insulin aspart (NOVOLOG) 100 UNIT/ML injection. Patient needs to discuss.

## 2016-10-10 ENCOUNTER — Encounter: Payer: Self-pay | Admitting: Family Medicine

## 2016-10-10 ENCOUNTER — Other Ambulatory Visit: Payer: Self-pay

## 2016-10-12 MED ORDER — FREESTYLE LIBRE 14 DAY SENSOR MISC: 1.0000 | 3 refills | Status: DC

## 2016-10-12 MED ORDER — FREESTYLE LIBRE 14 DAY READER DEVI: 1.0000 | Freq: Once | 0 refills | Status: AC

## 2016-10-12 NOTE — Telephone Encounter (Signed)
Called and clarified with patient. Also informed him that [place] prescription was sent in to pharmacy.

## 2016-10-12 NOTE — Telephone Encounter (Signed)
I have sent a prescription to your pharmacy  

## 2016-10-12 NOTE — Telephone Encounter (Signed)
Called and left patient VM to call back. Dr. Loanne Drilling ok to send in prescription for freestyle libre?

## 2016-10-24 NOTE — Telephone Encounter (Signed)
Patient called in reference to Rx below. Patient stated that sensors and freestyle libre needs to go to [facility] not [facility]. Patient stated he checks his sugars at least 7 times daily. Please call patient with any questions. OK to leave message.

## 2016-10-30 ENCOUNTER — Telehealth: Payer: Self-pay

## 2016-10-30 ENCOUNTER — Encounter: Payer: Self-pay | Admitting: Endocrinology

## 2016-10-30 ENCOUNTER — Ambulatory Visit (INDEPENDENT_AMBULATORY_CARE_PROVIDER_SITE_OTHER): Payer: Medicare Other | Admitting: Endocrinology

## 2016-10-30 VITALS — BP 162/82 | HR 87 | Wt 217.4 lb

## 2016-10-30 DIAGNOSIS — E11649 Type 2 diabetes mellitus with hypoglycemia without coma: Secondary | ICD-10-CM | POA: Diagnosis not present

## 2016-10-30 LAB — POCT GLYCOSYLATED HEMOGLOBIN (HGB A1C): HEMOGLOBIN A1C: 8

## 2016-10-30 MED ORDER — FREESTYLE LIBRE 14 DAY READER DEVI: 1.0000 | Freq: Once | 0 refills | Status: AC

## 2016-10-30 MED ORDER — INSULIN GLARGINE 100 UNIT/ML ~~LOC~~ SOLN: 60.0000 [IU] | Freq: Every day | SUBCUTANEOUS | 5 refills | Status: DC

## 2016-10-30 MED ORDER — INSULIN ASPART 100 UNIT/ML ~~LOC~~ SOLN: 40.0000 [IU] | Freq: Three times a day (TID) | SUBCUTANEOUS | 3 refills | Status: DC

## 2016-10-30 MED ORDER — FREESTYLE LIBRE 14 DAY SENSOR MISC: 1.0000 | 3 refills | Status: DC

## 2016-10-30 NOTE — Telephone Encounter (Signed)
Please advise 

## 2016-10-30 NOTE — Telephone Encounter (Signed)
Patient calling about medications prescribed today. States [facility] needs clarification before being able to fill both Novolog and Lantus. Please call and advise. Reference number for meds: [id number] [facility]- [phone]

## 2016-10-30 NOTE — Patient Instructions (Addendum)
check your blood sugar 4 times a day.  vary the time of day when you check, between before the 3 meals, and at bedtime.  also check if you have symptoms of your blood sugar being too high or too low.  please keep a record of the readings and bring it to your next appointment here (or you can bring the meter itself).  You can write it on any piece of paper.  please call us sooner if your blood sugar goes below 70, or if you have a lot of readings over 200.   For now, please take the insulin numbers listed below, and:  You can stop taking the glipizide.  I have sent a prescription to [facility], for the "freestyle libre." Please come back for a follow-up appointment in 2 months.

## 2016-10-30 NOTE — Progress Notes (Signed)
Subjective:    Patient ID: Anthony Cuevas, male    DOB: 10/25/1950, 66 y.o.   MRN: [id number]  HPI Pt returns for f/u of diabetes mellitus: DM type: Insulin-requiring type 2 Dx'ed: 7829 Complications: renal insuff Therapy: insulin since 2006, and several oral meds GDM: never DKA: never Severe hypoglycemia: never Pancreatitis: never Pancreatic imaging:  Other: he stopped pioglitizone, due to edema Interval history: Pt takes lnatus 75 units qhs, and qac novolog, average of 30 units qac.  He brings a record of his cbg's which I have reviewed today, checked QID.  It varies from 100-300.  It is in general higher as the day goes on.  He requests a freestyle libre continuous glucose monitor Past Medical History:  Diagnosis Date  . Arthritis   . Depression    improved on sertraline  . Diabetes mellitus, type 2 ([place]) 09/1996  . GERD (gastroesophageal reflux disease) 1990  . History of peptic ulcer disease   . Hyperlipidemia 1992  . Hypertension 05/2003  . Lumbar stenosis L2-3  . Muscle atrophy    in arms from degenerative changes in neck  . Neck pain    chronic  . Smoker   . Wears glasses     Past Surgical History:  Procedure Laterality Date  . CERVICAL DISC SURGERY  04/19/02   fusion, Dr. Lorin Mercy  . CERVICAL DISCECTOMY  1997   partial  . CERVICAL DISCECTOMY  2000  . COLONOSCOPY WITH ESOPHAGOGASTRODUODENOSCOPY (EGD)  06/25/2015  . [place]  . KNEE ARTHROPLASTY  11/20/2011   Procedure: COMPUTER ASSISTED TOTAL KNEE ARTHROPLASTY;  Surgeon: Marybelle Killings, MD;  Location: [facility];  Service: Orthopedics;  Laterality: Left;  Conversion Left Knee Medial Uni to Total Knee Arthroplasty-Cemented  . LUMBAR LAMINECTOMY/DECOMPRESSION MICRODISCECTOMY N/A 04/28/2015   Procedure: L2-3 Decompression;  Surgeon: Marybelle Killings, MD;  Location: [facility];  Service: Orthopedics;  Laterality: N/A;  . MR MRA DUPLICATE EXAM  INACTIVATE    . MULTIPLE TOOTH EXTRACTIONS    . Neisen funduplication   5621  . REPLACEMENT TOTAL KNEE  02/14/02, 05/04/02   left- partial  . SCC EXCISION  04/25/01   Dr. March Rummage  . STOMACH SURGERY  1984   [facility], [place]    Social History   Social History  . Marital status: Married    Spouse name: N/A  . Number of children: 3  . Years of education: N/A   Occupational History  . Training and development officer, retired     Scientific laboratory technician   Social History Main Topics  . Smoking status: Former Smoker    Packs/day: 0.50    Years: 30.00    Types: Cigarettes    Quit date: 07/31/2011  . Smokeless tobacco: Never Used  . Alcohol use No  . Drug use: No  . Sexual activity: No   Other Topics Concern  . 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 B-D INS SYR MICROFINE 1CC/28G 28G X 1/2" 1 ML MISC USE TO INJECT INSULIN FOUR TIMES A DAY 400 each 2  . celecoxib (CELEBREX) 200 MG capsule TAKE 1 CAPSULE DAILY 90 capsule 1  . cholecalciferol (VITAMIN D) 1000 UNITS tablet Take 5,000 Units by mouth  once a week. On monday    . Continuous Blood Gluc Sensor (FREESTYLE LIBRE 14 DAY SENSOR) MISC 1 Device by Does not apply route once a week. 10 each 3  . cyanocobalamin 1000 MCG tablet Take 1,000 mcg by mouth daily.    . cyclobenzaprine (FLEXERIL) 10 MG tablet TAKE 1 TABLET TWICE A DAY AS NEEDED FOR MUSCLE SPASMS 180 tablet 3  . glucose blood (PRODIGY AUTOCODE TEST) test strip Use as instructed to test blood sugar 3-4 times per day    . lisinopril (PRINIVIL,ZESTRIL) 20 MG tablet Take 1 tablet (20 mg total) by mouth daily. 90 tablet 3  . LUMIGAN 0.01 % SOLN Place 1 drop into both eyes at bedtime.     . metFORMIN (GLUCOPHAGE) 1000 MG tablet Take 1 tablet (1,000 mg total) by mouth 2 (two) times daily with a meal. 180 tablet 3  . NIASPAN 500 MG CR tablet  TAKE 1 TABLET AT BEDTIME 90 tablet 0  . oxyCODONE-acetaminophen (PERCOCET/ROXICET) 5-325 MG tablet Take 1-2 tablets by mouth every 6 (six) hours as needed for moderate pain. 40 tablet 0  . pravastatin (PRAVACHOL) 80 MG tablet TAKE 1 TABLET DAILY 90 tablet 1   No current facility-administered medications on file prior to visit.     No Known Allergies  Family History  Problem Relation Age of Onset  . Diabetes Mother   . Hypertension Mother   . Stroke Mother   . Diabetes Sister   . Diabetes Brother   . Diabetes Brother   . Heart disease Brother   . Diabetes Father   . Cancer Maternal Grandmother        breast  . Lung disease Paternal Grandfather        black lung  . Arthritis Neg Hx   . Prostate cancer Neg Hx   . Colon cancer Neg Hx     BP (!) 162/82   Pulse 87   Wt 217 lb 6.4 oz (98.6 kg)   SpO2 98%   BMI 32.10 kg/m   Review of Systems He denies hypoglycemia.     Objective:   Physical Exam VITAL SIGNS:  See vs page GENERAL: no distress Pulses: foot pulses are intact bilaterally.   MSK: no deformity of the feet or ankles.  CV: 1+ bilat edema of the legs.  Skin:  no ulcer on the feet or ankles.  normal color and temp on the feet and ankles Neuro: sensation is intact to touch on the feet and ankles.   Ext: There is bilateral onychomycosis of the toenails.       Assessment & Plan:  Insulin-requiring type 2 DM: better with multiple daily injections.  Renal insuff: he may not need basal insulin.   Patient Instructions  check your blood sugar 4 times a day.  vary the time of day when you check, between before the 3 meals, and at bedtime.  also check if you have symptoms of your blood sugar being too high or too low.  please keep a record of the readings and bring it to your next appointment here (or you can bring the meter itself).  You can write it on any piece of paper.  please call us sooner if your blood sugar goes below 70, or if you have a lot of readings over  200.   For now, please take the insulin numbers listed below, and:  You can stop taking the glipizide.  I have sent a prescription to [facility] for the "  freestyle libre." Please come back for a follow-up appointment in 2 months.

## 2016-10-30 NOTE — Telephone Encounter (Signed)
[facility] & spoke with Laural Benes, pharmacist to clarify.

## 2016-11-05 ENCOUNTER — Other Ambulatory Visit: Payer: Self-pay | Admitting: Family Medicine

## 2016-11-08 ENCOUNTER — Telehealth: Payer: Self-pay | Admitting: Endocrinology

## 2016-11-08 NOTE — Telephone Encounter (Signed)
please call patient: In order to approve freestyle libre, ins requests a record of cbg checked qid.  Please send or bring to [facility]

## 2016-11-08 NOTE — Telephone Encounter (Signed)
Called patient & he stated that he would bring [facility] record by today

## 2016-11-10 ENCOUNTER — Other Ambulatory Visit: Payer: Self-pay | Admitting: Family Medicine

## 2016-11-23 ENCOUNTER — Ambulatory Visit (INDEPENDENT_AMBULATORY_CARE_PROVIDER_SITE_OTHER): Payer: Medicare Other

## 2016-11-23 ENCOUNTER — Other Ambulatory Visit: Payer: Self-pay | Admitting: Family Medicine

## 2016-11-23 VITALS — BP 140/70 | HR 93 | Temp 98.0°F | Ht 68.0 in | Wt 220.8 lb

## 2016-11-23 DIAGNOSIS — E11649 Type 2 diabetes mellitus with hypoglycemia without coma: Secondary | ICD-10-CM

## 2016-11-23 DIAGNOSIS — E559 Vitamin D deficiency, unspecified: Secondary | ICD-10-CM | POA: Diagnosis not present

## 2016-11-23 DIAGNOSIS — Z23 Encounter for immunization: Secondary | ICD-10-CM | POA: Diagnosis not present

## 2016-11-23 DIAGNOSIS — Z0000 Encounter for general adult medical examination without abnormal findings: Secondary | ICD-10-CM

## 2016-11-23 DIAGNOSIS — D649 Anemia, unspecified: Secondary | ICD-10-CM | POA: Diagnosis not present

## 2016-11-23 LAB — COMPREHENSIVE METABOLIC PANEL
ALK PHOS: 81 U/L (ref 39–117)
ALT: 61 U/L — AB (ref 0–53)
AST: 49 U/L — ABNORMAL HIGH (ref 0–37)
Albumin: 4.3 g/dL (ref 3.5–5.2)
BILIRUBIN TOTAL: 0.5 mg/dL (ref 0.2–1.2)
BUN: 19 mg/dL (ref 6–23)
CALCIUM: 9.9 mg/dL (ref 8.4–10.5)
CO2: 28 mEq/L (ref 19–32)
Chloride: 101 mEq/L (ref 96–112)
Creatinine, Ser: 1.08 mg/dL (ref 0.40–1.50)
GFR: 72.95 mL/min (ref >60.00)
Glucose, Bld: 221 mg/dL — ABNORMAL HIGH (ref 70–99)
POTASSIUM: 4.5 meq/L (ref 3.5–5.1)
Sodium: 138 mEq/L (ref 135–145)
TOTAL PROTEIN: 7.5 g/dL (ref 6.0–8.3)

## 2016-11-23 LAB — CBC WITH DIFFERENTIAL/PLATELET
BASOS ABS: 0.1 10*3/uL (ref 0.0–0.1)
Basophils Relative: 0.9 % (ref 0.0–3.0)
EOS PCT: 3.1 % (ref 0.0–5.0)
Eosinophils Absolute: 0.3 10*3/uL (ref 0.0–0.7)
HEMATOCRIT: 44.2 % (ref 39.0–52.0)
HEMOGLOBIN: 14.7 g/dL (ref 13.0–17.0)
LYMPHS PCT: 23.7 % (ref 12.0–46.0)
Lymphs Abs: 2.1 10*3/uL (ref 0.7–4.0)
MCHC: 33.2 g/dL (ref 30.0–36.0)
MCV: 92.4 fl (ref 78.0–100.0)
MONOS PCT: 12.5 % — AB (ref 3.0–12.0)
Monocytes Absolute: 1.1 10*3/uL — ABNORMAL HIGH (ref 0.1–1.0)
Neutro Abs: 5.3 10*3/uL (ref 1.4–7.7)
Neutrophils Relative %: 59.8 % (ref 43.0–77.0)
Platelets: 296 10*3/uL (ref 150.0–400.0)
RBC: 4.78 Mil/uL (ref 4.22–5.81)
RDW: 12.9 % (ref 11.5–15.5)
WBC: 8.9 10*3/uL (ref 4.0–10.5)

## 2016-11-23 LAB — IBC PANEL
Iron: 62 ug/dL (ref 42–165)
SATURATION RATIOS: 12.7 % — AB (ref 20.0–50.0)
Transferrin: 348 mg/dL (ref 212.0–360.0)

## 2016-11-23 LAB — LIPID PANEL
CHOL/HDL RATIO: 6
Cholesterol: 203 mg/dL — ABNORMAL HIGH (ref 0–200)
HDL: 32.7 mg/dL — ABNORMAL LOW (ref >39.00)
NonHDL: 170.57
Triglycerides: 387 mg/dL — ABNORMAL HIGH (ref 0.0–149.0)
VLDL: 77.4 mg/dL — AB (ref 0.0–40.0)

## 2016-11-23 LAB — VITAMIN D 25 HYDROXY (VIT D DEFICIENCY, FRACTURES): VITD: 20.17 ng/mL — AB (ref 30.00–100.00)

## 2016-11-23 LAB — LDL CHOLESTEROL, DIRECT: LDL DIRECT: 111 mg/dL

## 2016-11-23 NOTE — Progress Notes (Signed)
PCP notes:   Health maintenance:  Flu vaccine - administered  Abnormal screenings:   Mini-Cog score: 19/20  Patient concerns:   None  Nurse concerns:  None  Next PCP appt:   11/30/16 @ 1045 I reviewed health advisor's note, was available for consultation on the day of service listed in this note, and agree with documentation and plan. Elsie Stain, MD.

## 2016-11-23 NOTE — Progress Notes (Signed)
Pre visit review using our clinic review tool, if applicable. No additional management support is needed unless otherwise documented below in the visit note. 

## 2016-11-23 NOTE — Patient Instructions (Signed)
Anthony Cuevas , Thank you for taking time to come for your Medicare Wellness Visit. I appreciate your ongoing commitment to your health goals. Please review the following plan we discussed and let me know if I can assist you in the future.   These are the goals we discussed: Goals    . Increase physical activity          Starting 11/23/2016, I will continue to walk at least 30 min 2 days per week as tolerated.        This is a list of the screening recommended for you and due dates:  Health Maintenance  Topic Date Due  . Hemoglobin A1C  04/30/2017  . Eye exam for diabetics  09/19/2017  . Complete foot exam   10/30/2017  . Colon Cancer Screening  06/24/2020  . Tetanus Vaccine  12/06/2021  . Flu Shot  Completed  .  Hepatitis C: One time screening is recommended by Center for Disease Control  (CDC) for  adults born from [birth year] through 1964.   Completed  . HIV Screening  Completed   Preventive Care for Adults  A healthy lifestyle and preventive care can promote health and wellness. Preventive health guidelines for adults include the following key practices.  . A routine yearly physical is a good way to check with your health care provider about your health and preventive screening. It is a chance to share any concerns and updates on your health and to receive a thorough exam.  . Visit your dentist for a routine exam and preventive care every 6 months. Brush your teeth twice a day and floss once a day. Good oral hygiene prevents tooth decay and gum disease.  . The frequency of eye exams is based on your age, health, family medical history, use  of contact lenses, and other factors. Follow your health care provider's recommendations for frequency of eye exams.  . Eat a healthy diet. Foods like vegetables, fruits, whole grains, low-fat dairy products, and lean protein foods contain the nutrients you need without too many calories. Decrease your intake of foods high in solid fats, added  sugars, and salt. Eat the right amount of calories for you. Get information about a proper diet from your health care provider, if necessary.  . Regular physical exercise is one of the most important things you can do for your health. Most adults should get at least 150 minutes of moderate-intensity exercise (any activity that increases your heart rate and causes you to sweat) each week. In addition, most adults need muscle-strengthening exercises on 2 or more days a week.  Silver Sneakers may be a benefit available to you. To determine eligibility, you may visit the website: www.silversneakers.com or contact program at [phone] Mon-Fri between 8AM-8PM.   . Maintain a healthy weight. The body mass index (BMI) is a screening tool to identify possible weight problems. It provides an estimate of body fat based on height and weight. Your health care provider can find your BMI and can help you achieve or maintain a healthy weight.   For adults 20 years and older: ? A BMI below 18.5 is considered underweight. ? A BMI of 18.5 to 24.9 is normal. ? A BMI of 25 to 29.9 is considered overweight. ? A BMI of 30 and above is considered obese.   . Maintain normal blood lipids and cholesterol levels by exercising and minimizing your intake of saturated fat. Eat a balanced diet with plenty of fruit and vegetables. Blood  tests for lipids and cholesterol should begin at age 55 and be repeated every 5 years. If your lipid or cholesterol levels are high, you are over 50, or you are at high risk for heart disease, you may need your cholesterol levels checked more frequently. Ongoing high lipid and cholesterol levels should be treated with medicines if diet and exercise are not working.  . If you smoke, find out from your health care provider how to quit. If you do not use tobacco, please do not start.  . If you choose to drink alcohol, please do not consume more than 2 drinks per day. One drink is considered to  be 12 ounces (355 mL) of beer, 5 ounces (148 mL) of wine, or 1.5 ounces (44 mL) of liquor.  . If you are 68-60 years old, ask your health care provider if you should take aspirin to prevent strokes.  . Use sunscreen. Apply sunscreen liberally and repeatedly throughout the day. You should seek shade when your shadow is shorter than you. Protect yourself by wearing long sleeves, pants, a wide-brimmed hat, and sunglasses year round, whenever you are outdoors.  . Once a month, do a whole body skin exam, using a mirror to look at the skin on your back. Tell your health care provider of new moles, moles that have irregular borders, moles that are larger than a pencil eraser, or moles that have changed in shape or color.

## 2016-11-23 NOTE — Progress Notes (Signed)
Subjective:   Anthony Cuevas is a 66 y.o. male who presents for Medicare Annual/Subsequent preventive examination.  Review of Systems: N/A Cardiac Risk Factors include: advanced age (>30men, >63 women);diabetes mellitus;hypertension;male gender;obesity (BMI >30kg/m2);dyslipidemia     Objective:    Vitals: BP 140/70 (BP Location: Right Arm, Patient Position: Sitting, Cuff Size: Normal)   Pulse 93   Temp 98 F (36.7 C) (Oral)   Ht 5\' 8"  (1.727 m) Comment: no shoes  Wt 220 lb 12 oz (100.1 kg)   SpO2 99%   BMI 33.56 kg/m   Body mass index is 33.56 kg/m.  Tobacco History  Smoking Status  . Former Smoker  . Packs/day: 0.50  . Years: 30.00  . Types: Cigarettes  . Quit date: 07/31/2011  Smokeless Tobacco  . Never Used     Counseling given: No   Past Medical History:  Diagnosis Date  . Arthritis   . Depression    improved on sertraline  . Diabetes mellitus, type 2 ([place]) 09/1996  . GERD (gastroesophageal reflux disease) 1990  . History of peptic ulcer disease   . Hyperlipidemia 1992  . Hypertension 05/2003  . Lumbar stenosis L2-3  . Muscle atrophy    in arms from degenerative changes in neck  . Neck pain    chronic  . Smoker   . Wears glasses    Past Surgical History:  Procedure Laterality Date  . CERVICAL DISC SURGERY  04/19/02   fusion, Dr. Lorin Mercy  . CERVICAL DISCECTOMY  1997   partial  . CERVICAL DISCECTOMY  2000  . COLONOSCOPY WITH ESOPHAGOGASTRODUODENOSCOPY (EGD)  06/25/2015  . [place]  . KNEE ARTHROPLASTY  11/20/2011   Procedure: COMPUTER ASSISTED TOTAL KNEE ARTHROPLASTY;  Surgeon: Marybelle Killings, MD;  Location: [facility];  Service: Orthopedics;  Laterality: Left;  Conversion Left Knee Medial Uni to Total Knee Arthroplasty-Cemented  . LUMBAR LAMINECTOMY/DECOMPRESSION MICRODISCECTOMY N/A 04/28/2015   Procedure: L2-3 Decompression;  Surgeon: Marybelle Killings, MD;  Location: [facility];  Service: Orthopedics;  Laterality: N/A;  . MR MRA DUPLICATE EXAM   INACTIVATE    . MULTIPLE TOOTH EXTRACTIONS    . Neisen funduplication  0254  . REPLACEMENT TOTAL KNEE  02/14/02, 05/04/02   left- partial  . SCC EXCISION  04/25/01   Dr. March Rummage  . STOMACH SURGERY  1984   [facility], [facility]   Family History  Problem Relation Age of Onset  . Diabetes Mother   . Hypertension Mother   . Stroke Mother   . Diabetes Sister   . Diabetes Brother   . Diabetes Brother   . Heart disease Brother   . Diabetes Father   . Cancer Maternal Grandmother        breast  . Lung disease Paternal Grandfather        black lung  . Arthritis Neg Hx   . Prostate cancer Neg Hx   . Colon cancer Neg Hx    History  Sexual Activity  . Sexual activity: No    Outpatient Encounter Prescriptions as of 11/23/2016  Medication Sig  . B-D INS SYR MICROFINE 1CC/28G 28G X 1/2" 1 ML MISC USE TO INJECT INSULIN FOUR TIMES A DAY  . celecoxib (CELEBREX) 200 MG capsule TAKE 1 CAPSULE DAILY  . cholecalciferol (VITAMIN D) 1000 UNITS tablet Take 5,000 Units by mouth once a week. On monday  . Continuous Blood Gluc Sensor (FREESTYLE LIBRE 14 DAY SENSOR) MISC 1 Device by Does not apply route once  a week.  . Continuous Blood Gluc Sensor (FREESTYLE LIBRE 14 DAY SENSOR) MISC 1 Device by Does not apply route every 14 (fourteen) days.  . cyanocobalamin 1000 MCG tablet Take 1,000 mcg by mouth daily.  . cyclobenzaprine (FLEXERIL) 10 MG tablet TAKE 1 TABLET TWICE A DAY AS NEEDED FOR MUSCLE SPASMS  . glucose blood (PRODIGY AUTOCODE TEST) test strip Use as instructed to test blood sugar 3-4 times per day  . insulin aspart (NOVOLOG) 100 UNIT/ML injection Inject 40 Units into the skin 3 (three) times daily with meals. And syringes 4/day.  . insulin glargine (LANTUS) 100 UNIT/ML injection Inject 0.6 mLs (60 Units total) into the skin at bedtime. INJECT 75 UNITS UNDER THE SKIN ONCE DAILY  . lisinopril (PRINIVIL,ZESTRIL) 20 MG tablet TAKE 1 TABLET DAILY  . LUMIGAN 0.01 % SOLN Place 1 drop into both eyes at bedtime.   .  metFORMIN (GLUCOPHAGE) 1000 MG tablet Take 1 tablet (1,000 mg total) by mouth 2 (two) times daily with a meal.  . NIASPAN 500 MG CR tablet TAKE 1 TABLET AT BEDTIME  . Omega-3 Fatty Acids (FISH OIL PO) Take 1,000 mg by mouth once a week.  . pravastatin (PRAVACHOL) 80 MG tablet TAKE 1 TABLET DAILY  . oxyCODONE-acetaminophen (PERCOCET/ROXICET) 5-325 MG tablet Take 1-2 tablets by mouth every 6 (six) hours as needed for moderate pain. (Patient not taking: Reported on 11/23/2016)   No facility-administered encounter medications on file as of 11/23/2016.     Activities of Daily Living In your present state of health, do you have any difficulty performing the following activities: 11/23/2016  Hearing? N  Vision? N  Difficulty concentrating or making decisions? N  Walking or climbing stairs? N  Dressing or bathing? N  Doing errands, shopping? N  Preparing Food and eating ? N  Using the Toilet? N  In the past six months, have you accidently leaked urine? N  Do you have problems with loss of bowel control? N  Managing your Medications? N  Managing your Finances? N  Housekeeping or managing your Housekeeping? N  Some recent data might be hidden    Patient Care Team: Tonia Ghent, MD as PCP - General (Family Medicine) Marybelle Killings, MD as Consulting Physician (Orthopedic Surgery) Zehr, Laban Emperor, PA-C as Physician Assistant (Gastroenterology) Gardiner Barefoot, DPM as Consulting Physician (Podiatry) Thelma Comp, [place] as Referring Physician (Optometry) Renato Shin, MD as Consulting Physician (Endocrinology)   Assessment:     Hearing Screening   125Hz  250Hz  500Hz  1000Hz  2000Hz  3000Hz  4000Hz  6000Hz  8000Hz   Right ear:   40 40 40  40    Left ear:   40 40 40  40    Vision Screening Comments: Last vision exam in 08/2016 with Dr. Maryruth Hancock B.    Exercise Activities and Dietary recommendations Current Exercise Habits: Home exercise routine, Type of exercise: walking, Time (Minutes): 30,  Frequency (Times/Week): 2, Weekly Exercise (Minutes/Week): 60, Intensity: Mild, Exercise limited by: orthopedic condition(s)  Goals    . Increase physical activity          Starting 11/23/2016, I will continue to walk at least 30 min 2 days per week as tolerated.       Fall Risk Fall Risk  11/23/2016 08/06/2015 06/12/2013  Falls in the past year? No Yes No  Comment - fell while on riding lawn mower -  Number falls in past yr: - 1 -  Injury with Fall? - Yes -  Risk Factor Category  -  High Fall Risk -  Follow up - Falls evaluation completed -   Depression Screen PHQ 2/9 Scores 11/23/2016 08/06/2015 06/12/2013  PHQ - 2 Score 0 0 0  PHQ- 9 Score 0 - -    Cognitive Function MMSE - Mini Mental State Exam 11/23/2016 08/06/2015  Orientation to time 5 5  Orientation to Place 5 5  Registration 3 3  Attention/ Calculation 0 0  Recall 2 3  Recall-comments unable to recall 1 of 3 words -  Language- name 2 objects 0 0  Language- repeat 1 1  Language- follow 3 step command 3 3  Language- read & follow direction 0 0  Write a sentence 0 0  Copy design 0 0  Total score 19 20       PLEASE NOTE: A Mini-Cog screen was completed. Maximum score is 20. A value of 0 denotes this part of [place] MMSE was not completed or the patient failed this part of the Mini-Cog screening.   Mini-Cog Screening Orientation to Time - Max 5 pts Orientation to Place - Max 5 pts Registration - Max 3 pts Recall - Max 3 pts Language Repeat - Max 1 pts Language Follow 3 Step Command - Max 3 pts   Immunization History  Administered Date(s) Administered  . H1N1 01/30/2008  . Influenza Split 12/08/2010, 10/20/2011  . Influenza Whole 12/03/2008  . Influenza,inj,Quad PF,6+ Mos 12/06/2012, 12/15/2013, 11/17/2014, 11/16/2015, 11/23/2016  . Pneumococcal Polysaccharide-23 06/12/2013  . Td 09/06/2001  . Tdap 12/07/2011   Screening Tests Health Maintenance  Topic Date Due  . HEMOGLOBIN A1C  04/30/2017  .  OPHTHALMOLOGY EXAM  09/19/2017  . FOOT EXAM  10/30/2017  . COLONOSCOPY  06/24/2020  . TETANUS/TDAP  12/06/2021  . INFLUENZA VACCINE  Completed  . Hepatitis C Screening  Completed  . HIV Screening  Completed      Plan:     I have personally reviewed, addressed, and noted the following in the patient's chart:  A. Medical and social history B. Use of alcohol, tobacco or illicit drugs  C. Current medications and supplements D. Functional ability and status E.  Nutritional status F.  Physical activity G. Advance directives H. List of other physicians I.  Hospitalizations, surgeries, and ER visits in previous 12 months J.  [place] to include hearing, vision, cognitive, depression L. Referrals and appointments - none  In addition, I have reviewed and discussed with patient certain preventive protocols, quality metrics, and best practice recommendations. A written personalized care plan for preventive services as well as general preventive health recommendations were provided to patient.  See attached scanned questionnaire for additional information.   Signed,   Lindell Noe, MHA, BS, LPN Health Coach

## 2016-11-24 ENCOUNTER — Telehealth: Payer: Self-pay | Admitting: Endocrinology

## 2016-11-24 NOTE — Telephone Encounter (Signed)
Pharmacy needs latest office visit, medical medication list, testing frequency, insulin regiment or adjustment scale. Insulin needs to be 3 times or more. The blood sugar needs to be 4 times or more. Please contact Teri at [facility] at ph# [phone]

## 2016-11-24 NOTE — Telephone Encounter (Signed)
I called [facility] & due to high call volume was asked to a leave a detailed message. I left the message for [place] & stated that I was faxing over last progress notes for patient as well as [facility] logs. I asked for a call back to let me know if this was all that was needed to make sure he gets his medical supplies ASAP.

## 2016-11-30 ENCOUNTER — Ambulatory Visit (INDEPENDENT_AMBULATORY_CARE_PROVIDER_SITE_OTHER): Payer: Medicare Other | Admitting: Family Medicine

## 2016-11-30 ENCOUNTER — Ambulatory Visit (INDEPENDENT_AMBULATORY_CARE_PROVIDER_SITE_OTHER)
Admission: RE | Admit: 2016-11-30 | Discharge: 2016-11-30 | Disposition: A | Discharge status: Home / Self Care | Payer: Medicare Other | Source: Ambulatory Visit | Attending: Family Medicine | Admitting: Family Medicine

## 2016-11-30 ENCOUNTER — Encounter: Payer: Self-pay | Admitting: Family Medicine

## 2016-11-30 VITALS — BP 140/70 | HR 93 | Temp 98.0°F | Ht 68.0 in | Wt 220.8 lb

## 2016-11-30 DIAGNOSIS — M542 Cervicalgia: Secondary | ICD-10-CM

## 2016-11-30 DIAGNOSIS — I10 Essential (primary) hypertension: Secondary | ICD-10-CM

## 2016-11-30 DIAGNOSIS — Z862 Personal history of diseases of the blood and blood-forming organs and certain disorders involving the immune mechanism: Secondary | ICD-10-CM

## 2016-11-30 DIAGNOSIS — D509 Iron deficiency anemia, unspecified: Secondary | ICD-10-CM | POA: Diagnosis not present

## 2016-11-30 DIAGNOSIS — Z0000 Encounter for general adult medical examination without abnormal findings: Secondary | ICD-10-CM

## 2016-11-30 DIAGNOSIS — R945 Abnormal results of liver function studies: Secondary | ICD-10-CM

## 2016-11-30 DIAGNOSIS — E785 Hyperlipidemia, unspecified: Secondary | ICD-10-CM

## 2016-11-30 DIAGNOSIS — E559 Vitamin D deficiency, unspecified: Secondary | ICD-10-CM | POA: Diagnosis not present

## 2016-11-30 DIAGNOSIS — Z7189 Other specified counseling: Secondary | ICD-10-CM

## 2016-11-30 DIAGNOSIS — R7989 Other specified abnormal findings of blood chemistry: Secondary | ICD-10-CM

## 2016-11-30 MED ORDER — INSULIN GLARGINE 100 UNIT/ML ~~LOC~~ SOLN: 75.0000 [IU] | Freq: Every day | SUBCUTANEOUS | Status: DC

## 2016-11-30 NOTE — Progress Notes (Signed)
He is seeing endo re: DM2 and I'll defer.  D/w pt.  He is still using his old meter to check his sugar.    Elevated Cholesterol: Using medications without problems:yes Muscle aches: not from statin Diet compliance: encouraged.  Exercise: limited by joint pain  Hypertension:  Using medication without problems or lightheadedness: yes Chest pain with exertion:no Edema:no Short of breath:no Labs d/w pt.    Vit D def.  Taking 5000 units per week.  Labs d/w pt.  See AVS/med order.    H/o iron def anemia.  Resolved now.  Off iron.  Can refer back to GI if not better or if levels drop again.  No known bleeding.    Mild transaminitis.  D/w pt.  No abd pain.  No vomiting.  Still on pravastatin.  No etoh.  No jaundice.    Had used oxycodone rarely for neck pain.  He has been taking aleve w/o much help.  He had tried flexeril concurrently but was still having pain.  S/p 4 neck surgeries already.  It doesn't radiate into the arms but stays between the shoulder blades and in the neck.  Oxycodone helped some previously w/o ADE.  No recent imaging.  Also noted that he is s/p nissen fundoplication.      Former smoker.  [id number].  1.5 PPD.  30 pack year hx form that time period, but he didn't quit until about 5 years ago.  D/w pt about screening program and he'll consider.  I'll defer to patient to update me.   Living will d/w pt.  Wife designated if patient were incapacitated.   Prostate cancer screening and PSA options (with potential risks and benefits of testing vs not testing) were discussed along with recent recs/guidelines.  He declined testing PSA at this point. Vaccinations d/w pt.  See AVS.   Meds, vitals, and allergies reviewed.   PMH and SH reviewed  ROS: Per HPI unless specifically indicated in ROS section   GEN: nad, alert and oriented HEENT: mucous membranes moist NECK: supple w/o LA CV: rrr. PULM: ctab, no inc wob ABD: soft, +bs EXT: no edema SKIN: no acute rash but he has  the remnant of a prev treated ringworm on the R foot.

## 2016-11-30 NOTE — Patient Instructions (Addendum)
Check with your insurance to see if they will cover the shingrix shot.  [facility] may be able to help with that.   Let me know if you want to go through the lung cancer screening program.  Recheck labs in about 3 months.  Get your neck xray done on the way out.  Take more vitamin D in the meantime.  Take care.  Glad to see you.

## 2016-12-03 DIAGNOSIS — R945 Abnormal results of liver function studies: Secondary | ICD-10-CM

## 2016-12-03 DIAGNOSIS — Z0000 Encounter for general adult medical examination without abnormal findings: Secondary | ICD-10-CM | POA: Insufficient documentation

## 2016-12-03 DIAGNOSIS — M542 Cervicalgia: Secondary | ICD-10-CM | POA: Insufficient documentation

## 2016-12-03 DIAGNOSIS — R7989 Other specified abnormal findings of blood chemistry: Secondary | ICD-10-CM | POA: Insufficient documentation

## 2016-12-03 MED ORDER — TRAMADOL HCL 50 MG PO TABS: 50.0000 mg | ORAL_TABLET | Freq: Three times a day (TID) | ORAL | 1 refills | Status: DC | PRN

## 2016-12-03 NOTE — Assessment & Plan Note (Signed)
Continue current meds.  He agrees.   

## 2016-12-03 NOTE — Assessment & Plan Note (Signed)
Taking 5000 units per week.  Labs d/w pt.  See AVS/med order.

## 2016-12-03 NOTE — Assessment & Plan Note (Signed)
Former smoker.  [id number].  1.5 PPD.  30 pack year hx form that time period, but he didn't quit until about 5 years ago.  D/w pt about screening program and he'll consider.  I'll defer to patient to update me.   Living will d/w pt.  Wife designated if patient were incapacitated.   Prostate cancer screening and PSA options (with potential risks and benefits of testing vs not testing) were discussed along with recent recs/guidelines.  He declined testing PSA at this point. Vaccinations d/w pt.  See AVS.

## 2016-12-03 NOTE — Assessment & Plan Note (Signed)
Living will d/w pt.  Wife designated if patient were incapacitated.   ?

## 2016-12-03 NOTE — Assessment & Plan Note (Signed)
No abd pain.  No vomiting.  Still on pravastatin.  No etoh.  No jaundice.  May be DM2 related.  Would recheck LFTs periodically, d/w pt.

## 2016-12-03 NOTE — Assessment & Plan Note (Signed)
D/w pt.  Recheck plain films today.  May be a reasonable candidate for tramadol vs gabapentin.  See notes in imaging.

## 2016-12-03 NOTE — Assessment & Plan Note (Signed)
Continue statin, d/w pt.  He agrees.

## 2016-12-03 NOTE — Assessment & Plan Note (Signed)
Resolved now.  Off iron.  Can refer back to GI if not better or if levels drop again.  No known bleeding.   We can recheck periodically.

## 2016-12-27 ENCOUNTER — Ambulatory Visit (INDEPENDENT_AMBULATORY_CARE_PROVIDER_SITE_OTHER): Payer: Medicare Other | Admitting: Podiatry

## 2016-12-27 ENCOUNTER — Encounter: Payer: Self-pay | Admitting: Podiatry

## 2016-12-27 DIAGNOSIS — M79676 Pain in unspecified toe(s): Secondary | ICD-10-CM | POA: Diagnosis not present

## 2016-12-27 DIAGNOSIS — M2010 Hallux valgus (acquired), unspecified foot: Secondary | ICD-10-CM

## 2016-12-27 DIAGNOSIS — B351 Tinea unguium: Secondary | ICD-10-CM | POA: Diagnosis not present

## 2016-12-27 DIAGNOSIS — E119 Type 2 diabetes mellitus without complications: Secondary | ICD-10-CM

## 2016-12-27 NOTE — Progress Notes (Signed)
Patient ID: Anthony Cuevas, male   DOB: [birth year], 66 y.o.   MRN: [id number] Complaint:  Visit Type: Patient returns to my office for continued preventative foot care services. Complaint: Patient states" my nails have grown long and thick and become painful to walk and wear shoes" Patient has been diagnosed with DM with no foot complications. The patient presents for preventative foot care services. No changes to ROS.  Patient admits to numbness and tingling in his feet at night  Podiatric Exam: Vascular: dorsalis pedis and posterior tibial pulses are palpable bilateral. Capillary return is immediate. Temperature gradient is WNL. Skin turgor WNL  Sensorium: Diminished  Semmes Weinstein monofilament test. Normal tactile sensation bilaterally. Nail Exam: Pt has thick disfigured discolored nails with subungual debris noted bilateral entire nail hallux through fifth toenails Ulcer Exam: There is no evidence of ulcer or pre-ulcerative changes or infection. Orthopedic Exam: Muscle tone and strength are WNL. No limitations in general ROM. No crepitus or effusions noted. Foot type and digits show no abnormalities. Bony prominences are unremarkable.HAV B/L Skin: No Porokeratosis. 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 The patient understood the discussion of treatment and procedures well. All questions were answered thoroughly reviewed. Debridement of mycotic and hypertrophic toenails, 1 through 5 bilateral and clearing of subungual debris. No ulceration, no infection noted. Initiate diabetic footwear with HAV.  ABN signed for 2018. Return Visit-Office Procedure: Patient instructed to return to the office for a follow up visit 3 months for continued evaluation and treatment.    Gardiner Barefoot DPM

## 2017-01-01 ENCOUNTER — Ambulatory Visit: Payer: Medicare Other | Admitting: Endocrinology

## 2017-01-02 DIAGNOSIS — H401131 Primary open-angle glaucoma, bilateral, mild stage: Secondary | ICD-10-CM | POA: Diagnosis not present

## 2017-01-10 ENCOUNTER — Ambulatory Visit: Payer: TRICARE For Life (TFL) | Admitting: Orthotics

## 2017-01-11 ENCOUNTER — Other Ambulatory Visit: Payer: Self-pay | Admitting: *Deleted

## 2017-01-11 NOTE — Telephone Encounter (Signed)
Faxed refill request. Tramadol Last office visit:   11/30/2016 Last Filled:   30 tablet 1 12/03/2016  Please advise.

## 2017-01-12 MED ORDER — TRAMADOL HCL 50 MG PO TABS: 50.0000 mg | ORAL_TABLET | Freq: Three times a day (TID) | ORAL | 1 refills | Status: DC | PRN

## 2017-01-12 NOTE — Telephone Encounter (Signed)
Please call in.  Thanks.   

## 2017-01-12 NOTE — Telephone Encounter (Signed)
Medication phoned to pharmacy.  

## 2017-01-15 ENCOUNTER — Ambulatory Visit: Payer: Medicare Other | Admitting: Orthotics

## 2017-01-15 DIAGNOSIS — M2010 Hallux valgus (acquired), unspecified foot: Secondary | ICD-10-CM

## 2017-01-15 DIAGNOSIS — E119 Type 2 diabetes mellitus without complications: Secondary | ICD-10-CM

## 2017-01-15 DIAGNOSIS — Q828 Other specified congenital malformations of skin: Secondary | ICD-10-CM

## 2017-01-15 NOTE — Progress Notes (Signed)
[name] came in too early to pick up shoes; yet we did find out the dr Georga Hacking his DM2 is Renato Shin and not Phillip Heal.  Re ordered shoes and sent paperwork to proper doc.

## 2017-01-17 ENCOUNTER — Encounter: Payer: Self-pay | Admitting: Endocrinology

## 2017-01-17 ENCOUNTER — Ambulatory Visit (INDEPENDENT_AMBULATORY_CARE_PROVIDER_SITE_OTHER): Payer: Medicare Other | Admitting: Endocrinology

## 2017-01-17 VITALS — BP 142/72 | HR 124 | Wt 228.2 lb

## 2017-01-17 DIAGNOSIS — E11649 Type 2 diabetes mellitus with hypoglycemia without coma: Secondary | ICD-10-CM

## 2017-01-17 LAB — POCT GLYCOSYLATED HEMOGLOBIN (HGB A1C): Hemoglobin A1C: 8.7

## 2017-01-17 MED ORDER — FREESTYLE LIBRE 14 DAY READER DEVI: 1.0000 | Freq: Once | 0 refills | Status: AC

## 2017-01-17 MED ORDER — METFORMIN HCL 1000 MG PO TABS: 1000.0000 mg | ORAL_TABLET | Freq: Two times a day (BID) | ORAL | 3 refills | Status: DC

## 2017-01-17 MED ORDER — FREESTYLE LIBRE 14 DAY SENSOR MISC: 1.0000 | 3 refills | Status: DC

## 2017-01-17 MED ORDER — INSULIN ASPART 100 UNIT/ML ~~LOC~~ SOLN: 50.0000 [IU] | Freq: Three times a day (TID) | SUBCUTANEOUS | 3 refills | Status: DC

## 2017-01-17 NOTE — Progress Notes (Signed)
Subjective:    Patient ID: Anthony Cuevas, male    DOB: 25-Sep-1950, 66 y.o.   MRN: [id number]  HPI Pt returns for f/u of diabetes mellitus: DM type: Insulin-requiring type 2 Dx'ed: 4580 Complications: renal insuff Therapy: insulin since 2006, and several oral meds GDM: never DKA: never Severe hypoglycemia: never Pancreatitis: never Pancreatic imaging: never Other: he stopped pioglitizone, due to edema.   Interval history: he brings a record of his cbg's which I have reviewed today.  It varies from 129-309.  It is highest at lunch, and lowest fasting.  He does not miss the insulin.  pt states he feels well in general. Past Medical History:  Diagnosis Date  . Arthritis   . Depression    improved on sertraline  . Diabetes mellitus, type 2 ([place]) 09/1996  . GERD (gastroesophageal reflux disease) 1990  . History of peptic ulcer disease   . Hyperlipidemia 1992  . Hypertension 05/2003  . Lumbar stenosis L2-3  . Muscle atrophy    in arms from degenerative changes in neck  . Neck pain    chronic  . Smoker   . Wears glasses     Past Surgical History:  Procedure Laterality Date  . CERVICAL DISC SURGERY  04/19/02   fusion, Dr. Lorin Mercy  . CERVICAL DISCECTOMY  1997   partial  . CERVICAL DISCECTOMY  2000  . COLONOSCOPY WITH ESOPHAGOGASTRODUODENOSCOPY (EGD)  06/25/2015  . [place]  . KNEE ARTHROPLASTY  11/20/2011   Procedure: COMPUTER ASSISTED TOTAL KNEE ARTHROPLASTY;  Surgeon: Marybelle Killings, MD;  Location: [facility];  Service: Orthopedics;  Laterality: Left;  Conversion Left Knee Medial Uni to Total Knee Arthroplasty-Cemented  . LUMBAR LAMINECTOMY/DECOMPRESSION MICRODISCECTOMY N/A 04/28/2015   Procedure: L2-3 Decompression;  Surgeon: Marybelle Killings, MD;  Location: [facility];  Service: Orthopedics;  Laterality: N/A;  . MR MRA DUPLICATE EXAM  INACTIVATE    . MULTIPLE TOOTH EXTRACTIONS    . Neisen funduplication  9983  . REPLACEMENT TOTAL KNEE  02/14/02, 05/04/02   left- partial  .  SCC EXCISION  04/25/01   Dr. March Rummage  . STOMACH SURGERY  1984   PUD, [place]    Social History   Socioeconomic History  . Marital status: Married    Spouse name: Not on file  . Number of children: 3  . Years of education: Not on file  . Highest education level: Not on file  Social Needs  . Financial resource strain: Not on file  . Food insecurity - worry: Not on file  . Food insecurity - inability: Not on file  . Transportation needs - medical: Not on file  . Transportation needs - non-medical: Not on file  Occupational History  . Occupation: Training and development officer, retired    Comment: Scientific laboratory technician  Tobacco Use  . Smoking status: Former Smoker    Packs/day: 0.50    Years: 30.00    Pack years: 15.00    Types: Cigarettes    Last attempt to quit: 07/31/2011    Years since quitting: 5.4  . Smokeless tobacco: Never Used  Substance and Sexual Activity  . Alcohol use: No    Alcohol/week: 0.0 oz  . Drug use: No  . Sexual activity: No  Other Topics Concern  . 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 B-D INS SYR MICROFINE 1CC/28G 28G X 1/2" 1 ML MISC USE TO INJECT INSULIN FOUR TIMES A DAY 400 each 2  . celecoxib (CELEBREX) 200 MG capsule TAKE 1 CAPSULE DAILY 90 capsule 1  . cholecalciferol (VITAMIN D) 1000 UNITS tablet Take 5,000 Units by mouth 2 (two) times a week.    . cyanocobalamin 1000 MCG tablet Take 1,000 mcg by mouth daily.    . cyclobenzaprine (FLEXERIL) 10 MG tablet TAKE 1 TABLET TWICE A DAY AS NEEDED FOR MUSCLE SPASMS 180 tablet 3  . glucose blood (PRODIGY AUTOCODE TEST) test strip Use as instructed to test blood sugar 3-4 times per day    . insulin glargine (LANTUS) 100 UNIT/ML injection Inject 0.75 mLs (75 Units total) into the skin  at bedtime.    Marland Kitchen lisinopril (PRINIVIL,ZESTRIL) 20 MG tablet TAKE 1 TABLET DAILY 90 tablet 3  . LUMIGAN 0.01 % SOLN Place 1 drop into both eyes at bedtime.     Marland Kitchen NIASPAN 500 MG CR tablet TAKE 1 TABLET AT BEDTIME 90 tablet 1  . Omega-3 Fatty Acids (FISH OIL PO) Take 1,000 mg by mouth once a week.    . pravastatin (PRAVACHOL) 80 MG tablet TAKE 1 TABLET DAILY 90 tablet 1  . traMADol (ULTRAM) 50 MG tablet Take 1 tablet (50 mg total) by mouth every 8 (eight) hours as needed (sedation caution). 30 tablet 1   No current facility-administered medications on file prior to visit.     No Known Allergies  Family History  Problem Relation Age of Onset  . Diabetes Mother   . Hypertension Mother   . Stroke Mother   . Diabetes Sister   . Diabetes Brother   . Diabetes Brother   . Heart disease Brother   . Diabetes Father   . Cancer Maternal Grandmother        breast  . Lung disease Paternal Grandfather        black lung  . Arthritis Neg Hx   . Prostate cancer Neg Hx   . Colon cancer Neg Hx     BP (!) 142/72 (BP Location: Left Arm, Patient Position: Sitting, Cuff Size: Normal)   Pulse (!) 124   Wt 228 lb 3.2 oz (103.5 kg)   SpO2 97%   BMI 34.70 kg/m    Review of Systems He denies hypoglycemia    Objective:   Physical Exam VITAL SIGNS:  See vs page GENERAL: no distress Pulses: foot pulses are intact bilaterally.   MSK: no deformity of the feet or ankles.  CV: trace bilat edema of the legs.   Skin:  no ulcer on the feet or ankles.  normal temp on the feet and ankles.  There is slight erythema of the instep of the left foot, but no tenderness. Neuro: sensation is intact to touch on the feet and ankles.   Ext: There is bilateral onychomycosis of the toenails.   Lab Results  Component Value Date   HGBA1C 8.7 01/17/2017      Assessment & Plan:  Insulin-requiring type 2 DM: worse.  Renal insuff: in this setting, she needs less basal insulin, and more at meals.  Foot erythema:  new.  Doesn't look like cellulitis.    Patient Instructions  check your blood sugar 4 times a day.  vary the time of day when you check, between before the 3 meals, and  at bedtime.  also check if you have symptoms of your blood sugar being too high or too low.  please keep a record of the readings and bring it to your next appointment here (or you can bring the meter itself).  You can write it on any piece of paper.  please call us sooner if your blood sugar goes below 70, or if you have a lot of readings over 200.   For now, please take the insulin numbers listed below, and:  I have [facility] a [facility], for the "freestyle libre" device.   Please carefully watch the left foot, can call if the redness gets worse Please come back for a follow-up appointment in 2 months.

## 2017-01-17 NOTE — Patient Instructions (Addendum)
check your blood sugar 4 times a day.  vary the time of day when you check, between before the 3 meals, and at bedtime.  also check if you have symptoms of your blood sugar being too high or too low.  please keep a record of the readings and bring it to your next appointment here (or you can bring the meter itself).  You can write it on any piece of paper.  please call us sooner if your blood sugar goes below 70, or if you have a lot of readings over 200.   For now, please take the insulin numbers listed below, and:  I have [facility] a [facility], for the "freestyle libre" device.   Please carefully watch the left foot, can call if the redness gets worse Please come back for a follow-up appointment in 2 months.

## 2017-01-29 ENCOUNTER — Telehealth: Payer: Self-pay | Admitting: Podiatry

## 2017-01-29 NOTE — Telephone Encounter (Signed)
Called patient because we got a message from patients diabetic doctor would not sign off on the order for diabetic shoes. Canceled appointment and pt aware.

## 2017-02-05 ENCOUNTER — Other Ambulatory Visit: Payer: TRICARE For Life (TFL) | Admitting: Orthotics

## 2017-02-15 ENCOUNTER — Telehealth: Payer: Self-pay

## 2017-02-15 NOTE — Telephone Encounter (Signed)
Allie from [facility] stated she received chart notes and prescription for [facility] but she still needs mention of insulin adjustment or sliding scale which needs to be in writing so Fanny Skates is faxing over new form to be filled out-please call  [phone] when this has been done

## 2017-02-21 ENCOUNTER — Other Ambulatory Visit: Payer: Self-pay

## 2017-02-21 NOTE — Telephone Encounter (Signed)
I called [facility] & they are faxing over paperwork again to be filled out.

## 2017-03-02 ENCOUNTER — Other Ambulatory Visit (INDEPENDENT_AMBULATORY_CARE_PROVIDER_SITE_OTHER): Payer: Medicare Other

## 2017-03-02 DIAGNOSIS — Z862 Personal history of diseases of the blood and blood-forming organs and certain disorders involving the immune mechanism: Secondary | ICD-10-CM | POA: Diagnosis not present

## 2017-03-02 DIAGNOSIS — E559 Vitamin D deficiency, unspecified: Secondary | ICD-10-CM | POA: Diagnosis not present

## 2017-03-02 DIAGNOSIS — R945 Abnormal results of liver function studies: Secondary | ICD-10-CM

## 2017-03-02 DIAGNOSIS — R7989 Other specified abnormal findings of blood chemistry: Secondary | ICD-10-CM

## 2017-03-02 LAB — COMPREHENSIVE METABOLIC PANEL
ALBUMIN: 4.1 g/dL (ref 3.5–5.2)
ALT: 79 U/L — ABNORMAL HIGH (ref 0–53)
AST: 69 U/L — AB (ref 0–37)
Alkaline Phosphatase: 84 U/L (ref 39–117)
BUN: 19 mg/dL (ref 6–23)
CALCIUM: 9.7 mg/dL (ref 8.4–10.5)
CHLORIDE: 102 meq/L (ref 96–112)
CO2: 25 meq/L (ref 19–32)
CREATININE: 0.99 mg/dL (ref 0.40–1.50)
GFR: 80.59 mL/min (ref >60.00)
Glucose, Bld: 150 mg/dL — ABNORMAL HIGH (ref 70–99)
POTASSIUM: 4.2 meq/L (ref 3.5–5.1)
SODIUM: 137 meq/L (ref 135–145)
Total Bilirubin: 0.5 mg/dL (ref 0.2–1.2)
Total Protein: 7.6 g/dL (ref 6.0–8.3)

## 2017-03-02 LAB — CBC WITH DIFFERENTIAL/PLATELET
BASOS PCT: 0.6 % (ref 0.0–3.0)
Basophils Absolute: 0 10*3/uL (ref 0.0–0.1)
EOS PCT: 2.4 % (ref 0.0–5.0)
Eosinophils Absolute: 0.2 10*3/uL (ref 0.0–0.7)
HCT: 41.6 % (ref 39.0–52.0)
HEMOGLOBIN: 13.6 g/dL (ref 13.0–17.0)
Lymphocytes Relative: 18.6 % (ref 12.0–46.0)
Lymphs Abs: 1.3 10*3/uL (ref 0.7–4.0)
MCHC: 32.7 g/dL (ref 30.0–36.0)
MCV: 90.8 fl (ref 78.0–100.0)
Monocytes Absolute: 1.1 10*3/uL — ABNORMAL HIGH (ref 0.1–1.0)
Monocytes Relative: 15.3 % — ABNORMAL HIGH (ref 3.0–12.0)
NEUTROS ABS: 4.6 10*3/uL (ref 1.4–7.7)
Neutrophils Relative %: 63.1 % (ref 43.0–77.0)
PLATELETS: 262 10*3/uL (ref 150.0–400.0)
RBC: 4.58 Mil/uL (ref 4.22–5.81)
RDW: 14.1 % (ref 11.5–15.5)
WBC: 7.2 10*3/uL (ref 4.0–10.5)

## 2017-03-02 LAB — IBC PANEL
IRON: 88 ug/dL (ref 42–165)
Saturation Ratios: 17.5 % — ABNORMAL LOW (ref 20.0–50.0)
Transferrin: 360 mg/dL (ref 212.0–360.0)

## 2017-03-02 LAB — VITAMIN D 25 HYDROXY (VIT D DEFICIENCY, FRACTURES): VITD: 19.09 ng/mL — ABNORMAL LOW (ref 30.00–100.00)

## 2017-03-05 ENCOUNTER — Other Ambulatory Visit: Payer: Self-pay | Admitting: Family Medicine

## 2017-03-05 DIAGNOSIS — R7989 Other specified abnormal findings of blood chemistry: Secondary | ICD-10-CM

## 2017-03-05 DIAGNOSIS — R945 Abnormal results of liver function studies: Principal | ICD-10-CM

## 2017-03-07 ENCOUNTER — Other Ambulatory Visit: Payer: Self-pay | Admitting: Family Medicine

## 2017-03-07 ENCOUNTER — Encounter: Payer: Self-pay | Admitting: Podiatry

## 2017-03-07 ENCOUNTER — Ambulatory Visit (INDEPENDENT_AMBULATORY_CARE_PROVIDER_SITE_OTHER): Payer: Medicare Other | Admitting: Podiatry

## 2017-03-07 DIAGNOSIS — B351 Tinea unguium: Secondary | ICD-10-CM | POA: Diagnosis not present

## 2017-03-07 DIAGNOSIS — E119 Type 2 diabetes mellitus without complications: Secondary | ICD-10-CM | POA: Diagnosis not present

## 2017-03-07 DIAGNOSIS — E559 Vitamin D deficiency, unspecified: Secondary | ICD-10-CM

## 2017-03-07 DIAGNOSIS — M79676 Pain in unspecified toe(s): Secondary | ICD-10-CM

## 2017-03-07 NOTE — Progress Notes (Signed)
Patient ID: Anthony Cuevas, male   DOB: 07-25-1950, 67 y.o.   MRN: [id number] Complaint:  Visit Type: Patient returns to my office for continued preventative foot care services. Complaint: Patient states" my nails have grown long and thick and become painful to walk and wear shoes" Patient has been diagnosed with DM with no foot complications. The patient presents for preventative foot care services. No changes to ROS.    Podiatric Exam: Vascular: dorsalis pedis and posterior tibial pulses are palpable bilateral. Capillary return is immediate. Temperature gradient is WNL. Skin turgor WNL  Sensorium: Diminished  Semmes Weinstein monofilament test. Normal tactile sensation bilaterally. Nail Exam: Pt has thick disfigured discolored nails with subungual debris noted bilateral entire nail hallux through fifth toenails Ulcer Exam: There is no evidence of ulcer or pre-ulcerative changes or infection. Orthopedic Exam: Muscle tone and strength are WNL. No limitations in general ROM. No crepitus or effusions noted. Foot type and digits show no abnormalities. Bony prominences are unremarkable.HAV B/L Skin: No Porokeratosis. 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 The patient understood the discussion of treatment and procedures well. All questions were answered thoroughly reviewed. Debridement of mycotic and hypertrophic toenails, 1 through 5 bilateral and clearing of subungual debris. No ulceration, no infection noted. Initiate diabetic footwear with HAV.  ABN signed for 2019. Return Visit-Office Procedure: Patient instructed to return to the office for a follow up visit 3 months for continued evaluation and treatment.    Gardiner Barefoot DPM

## 2017-03-09 ENCOUNTER — Ambulatory Visit
Admission: RE | Admit: 2017-03-09 | Discharge: 2017-03-09 | Disposition: A | Discharge status: Home / Self Care | Payer: Medicare Other | Source: Ambulatory Visit | Attending: Family Medicine | Admitting: Family Medicine

## 2017-03-09 DIAGNOSIS — R7989 Other specified abnormal findings of blood chemistry: Secondary | ICD-10-CM

## 2017-03-09 DIAGNOSIS — K760 Fatty (change of) liver, not elsewhere classified: Secondary | ICD-10-CM | POA: Diagnosis not present

## 2017-03-09 DIAGNOSIS — R945 Abnormal results of liver function studies: Secondary | ICD-10-CM | POA: Insufficient documentation

## 2017-03-16 ENCOUNTER — Encounter: Payer: Self-pay | Admitting: Family Medicine

## 2017-03-19 ENCOUNTER — Telehealth: Payer: Self-pay | Admitting: Family Medicine

## 2017-03-19 NOTE — Telephone Encounter (Signed)
Hold the form for an OV.  Needs OV re: brace.  He has been seen about neck but not lower back pain recently.  We need to talk about options with the brace.  Thanks.

## 2017-03-19 NOTE — Telephone Encounter (Signed)
Patient advised that OV will be needed and patient states he will call in for an appointment at another time.

## 2017-03-20 ENCOUNTER — Ambulatory Visit (INDEPENDENT_AMBULATORY_CARE_PROVIDER_SITE_OTHER): Payer: Medicare Other | Admitting: Endocrinology

## 2017-03-20 ENCOUNTER — Encounter: Payer: Self-pay | Admitting: Endocrinology

## 2017-03-20 VITALS — BP 128/76 | HR 113 | Wt 230.4 lb

## 2017-03-20 DIAGNOSIS — E11649 Type 2 diabetes mellitus with hypoglycemia without coma: Secondary | ICD-10-CM | POA: Diagnosis not present

## 2017-03-20 LAB — POCT GLYCOSYLATED HEMOGLOBIN (HGB A1C): Hemoglobin A1C: 9.5

## 2017-03-20 MED ORDER — INSULIN GLARGINE 100 UNIT/ML SOLOSTAR PEN: 125.0000 [IU] | PEN_INJECTOR | SUBCUTANEOUS | 99 refills | Status: DC

## 2017-03-20 MED ORDER — INSULIN ASPART 100 UNIT/ML FLEXPEN: 60.0000 [IU] | PEN_INJECTOR | Freq: Every day | SUBCUTANEOUS | 11 refills | Status: DC

## 2017-03-20 NOTE — Progress Notes (Signed)
Subjective:    Patient ID: Anthony Cuevas, male    DOB: 1950-10-05, 67 y.o.   MRN: [id number]  HPI Pt returns for f/u of diabetes mellitus: DM type: Insulin-requiring type 2 Dx'ed: 4562 Complications: renal insuff Therapy: insulin since 2006, and several oral meds.   DKA: never Severe hypoglycemia: never Pancreatitis: never Pancreatic imaging: never Other: he stopped pioglitizone, due to edema; he takes multiple daily injections.   Interval history: continuous glucose monitor is downloaded, and I have reviewed today.  It varies from 120-309.  It is highest at HS, and lowest fasting.  He does not miss the insulin.  pt states he feels well in general.   Past Medical History:  Diagnosis Date  . Arthritis   . Depression    improved on sertraline  . Diabetes mellitus, type 2 ([place]) 09/1996  . GERD (gastroesophageal reflux disease) 1990  . History of peptic ulcer disease   . Hyperlipidemia 1992  . Hypertension 05/2003  . Lumbar stenosis L2-3  . Muscle atrophy    in arms from degenerative changes in neck  . Neck pain    chronic  . Smoker   . Wears glasses     Past Surgical History:  Procedure Laterality Date  . CERVICAL DISC SURGERY  04/19/02   fusion, Dr. Lorin Mercy  . CERVICAL DISCECTOMY  1997   partial  . CERVICAL DISCECTOMY  2000  . COLONOSCOPY WITH ESOPHAGOGASTRODUODENOSCOPY (EGD)  06/25/2015  . [place]  . KNEE ARTHROPLASTY  11/20/2011   Procedure: COMPUTER ASSISTED TOTAL KNEE ARTHROPLASTY;  Surgeon: Marybelle Killings, MD;  Location: [facility];  Service: Orthopedics;  Laterality: Left;  Conversion Left Knee Medial Uni to Total Knee Arthroplasty-Cemented  . LUMBAR LAMINECTOMY/DECOMPRESSION MICRODISCECTOMY N/A 04/28/2015   Procedure: L2-3 Decompression;  Surgeon: Marybelle Killings, MD;  Location: [facility];  Service: Orthopedics;  Laterality: N/A;  . MR MRA DUPLICATE EXAM  INACTIVATE    . MULTIPLE TOOTH EXTRACTIONS    . Neisen funduplication  5638  . REPLACEMENT TOTAL KNEE   02/14/02, 05/04/02   left- partial  . SCC EXCISION  04/25/01   Dr. March Rummage  . STOMACH SURGERY  1984   PUD, [place]    Social History   Socioeconomic History  . Marital status: Married    Spouse name: Not on file  . Number of children: 3  . Years of education: Not on file  . Highest education level: Not on file  Social Needs  . Financial resource strain: Not on file  . Food insecurity - worry: Not on file  . Food insecurity - inability: Not on file  . Transportation needs - medical: Not on file  . Transportation needs - non-medical: Not on file  Occupational History  . Occupation: Training and development officer, retired    Comment: Scientific laboratory technician  Tobacco Use  . Smoking status: Former Smoker    Packs/day: 0.50    Years: 30.00    Pack years: 15.00    Types: Cigarettes    Last attempt to quit: 07/31/2011    Years since quitting: 5.6  . Smokeless tobacco: Never Used  Substance and Sexual Activity  . Alcohol use: No    Alcohol/week: 0.0 oz  . Drug use: No  . Sexual activity: No  Other Topics Concern  . 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 B-D INS SYR MICROFINE 1CC/28G 28G X 1/2" 1 ML MISC USE TO INJECT INSULIN FOUR TIMES A DAY 400 each 2  . celecoxib (CELEBREX) 200 MG capsule TAKE 1 CAPSULE DAILY 90 capsule 1  . cholecalciferol (VITAMIN D) 1000 UNITS tablet Take 5,000 Units by mouth 2 (two) times a week.    . Continuous Blood Gluc Sensor (FREESTYLE LIBRE 14 DAY SENSOR) MISC 1 Device by Does not apply route every 14 (fourteen) days. 6 each 3  . cyanocobalamin 1000 MCG tablet Take 1,000 mcg by mouth daily.    . cyclobenzaprine (FLEXERIL) 10 MG tablet TAKE 1 TABLET TWICE A DAY AS NEEDED FOR MUSCLE SPASMS 180 tablet 3  . glucose blood (PRODIGY AUTOCODE  TEST) test strip Use as instructed to test blood sugar 3-4 times per day    . lisinopril (PRINIVIL,ZESTRIL) 20 MG tablet TAKE 1 TABLET DAILY 90 tablet 3  . LUMIGAN 0.01 % SOLN Place 1 drop into both eyes at bedtime.     . metFORMIN (GLUCOPHAGE) 1000 MG tablet Take 1 tablet (1,000 mg total) by mouth 2 (two) times daily with a meal. 180 tablet 3  . NIASPAN 500 MG CR tablet TAKE 1 TABLET AT BEDTIME 90 tablet 1  . Omega-3 Fatty Acids (FISH OIL PO) Take 1,000 mg by mouth once a week.    . pravastatin (PRAVACHOL) 80 MG tablet TAKE 1 TABLET DAILY 90 tablet 1   No current facility-administered medications on file prior to visit.     No Known Allergies  Family History  Problem Relation Age of Onset  . Diabetes Mother   . Hypertension Mother   . Stroke Mother   . Diabetes Sister   . Diabetes Brother   . Diabetes Brother   . Heart disease Brother   . Diabetes Father   . Cancer Maternal Grandmother        breast  . Lung disease Paternal Grandfather        black lung  . Arthritis Neg Hx   . Prostate cancer Neg Hx   . Colon cancer Neg Hx     BP 128/76 (BP Location: Left Arm, Patient Position: Sitting, Cuff Size: Normal)   Pulse (!) 113   Wt 230 lb 6.4 oz (104.5 kg)   SpO2 96%   BMI 35.03 kg/m    Review of Systems He denies hypoglycemia    Objective:   Physical Exam VITAL SIGNS:  See vs page GENERAL: no distress Pulses: foot pulses are intact bilaterally.   MSK: no deformity of the feet or ankles.  CV: trace bilat edema of the legs.   Skin:  no ulcer on the feet or ankles.  normal temp on the feet and ankles.  Neuro: sensation is intact to touch on the feet and ankles.   Ext: There is bilateral onychomycosis of the toenails.  Lab Results  Component Value Date   HGBA1C 9.5 03/20/2017   Lab Results  Component Value Date   CREATININE 0.99 03/02/2017   BUN 19 03/02/2017   NA 137 03/02/2017   K 4.2 03/02/2017   CL 102 03/02/2017   CO2 25 03/02/2017      Assessment &  Plan:  Insulin-requiring type 2 DM: worse. we'll try a simpler schedule.   Patient Instructions  check your blood sugar 4 times a day.  vary the time of  day when you check, between before the 3 meals, and at bedtime.  also check if you have symptoms of your blood sugar being too high or too low.  please keep a record of the readings and bring it to your next appointment here (or you can bring the meter itself).  You can write it on any piece of paper.  please call us sooner if your blood sugar goes below 70, or if you have a lot of readings over 200.   For now, please take the insulin numbers listed below, and:  Please come back for a follow-up appointment in 1 month.

## 2017-03-20 NOTE — Patient Instructions (Addendum)
check your blood sugar 4 times a day.  vary the time of day when you check, between before the 3 meals, and at bedtime.  also check if you have symptoms of your blood sugar being too high or too low.  please keep a record of the readings and bring it to your next appointment here (or you can bring the meter itself).  You can write it on any piece of paper.  please call us sooner if your blood sugar goes below 70, or if you have a lot of readings over 200.   For now, please take the insulin numbers listed below, and:  Please come back for a follow-up appointment in 1 month.

## 2017-03-21 ENCOUNTER — Ambulatory Visit (INDEPENDENT_AMBULATORY_CARE_PROVIDER_SITE_OTHER): Payer: Medicare Other | Admitting: Orthopaedic Surgery

## 2017-03-21 ENCOUNTER — Ambulatory Visit (INDEPENDENT_AMBULATORY_CARE_PROVIDER_SITE_OTHER): Payer: Medicare Other

## 2017-03-21 ENCOUNTER — Encounter (INDEPENDENT_AMBULATORY_CARE_PROVIDER_SITE_OTHER): Payer: Self-pay | Admitting: Orthopaedic Surgery

## 2017-03-21 VITALS — BP 181/87 | HR 106 | Ht 70.0 in | Wt 220.0 lb

## 2017-03-21 DIAGNOSIS — M7062 Trochanteric bursitis, left hip: Secondary | ICD-10-CM | POA: Diagnosis not present

## 2017-03-21 DIAGNOSIS — M25562 Pain in left knee: Secondary | ICD-10-CM

## 2017-03-21 DIAGNOSIS — M25552 Pain in left hip: Secondary | ICD-10-CM | POA: Diagnosis not present

## 2017-03-21 DIAGNOSIS — G8929 Other chronic pain: Secondary | ICD-10-CM | POA: Diagnosis not present

## 2017-03-21 MED ORDER — LIDOCAINE HCL 1 % IJ SOLN
0.5000 mL | INTRAMUSCULAR | Status: AC | PRN
  Administered 2017-03-21: .5 mL

## 2017-03-21 MED ORDER — METHYLPREDNISOLONE ACETATE 40 MG/ML IJ SUSP
40.0000 mg | INTRAMUSCULAR | Status: AC | PRN
  Administered 2017-03-21: 40 mg via INTRA_ARTICULAR

## 2017-03-21 MED ORDER — BUPIVACAINE HCL 0.25 % IJ SOLN
2.0000 mL | INTRAMUSCULAR | Status: AC | PRN
  Administered 2017-03-21: 2 mL via INTRA_ARTICULAR

## 2017-03-21 NOTE — Progress Notes (Signed)
Office Visit Note   Patient: Anthony Cuevas           Date of Birth: [birth year]           MRN: [id number] Visit Date: 03/21/2017              Requested by: Tonia Ghent, MD [address] [place], [place] [address] PCP: Tonia Ghent, MD   Assessment & Plan: Visit Diagnoses:  1. Chronic pain of left knee   2. Pain in left hip   3. Trochanteric bursitis, left hip     Plan: Post L2-3 decompression with follow-up MRI after his surgery showing no areas or residual compression.  He has some trochanteric bursitis on the left radiating down to his knee.  An injection performed he will watch his sugars closely with his monitor and adjust his insulin up if needed.  If he has persistent problems he will let us know.  I discussed with him that he may be having some mild disc bulge at L2-3 on the left side which is giving him some trochanteric bursitis.  X-rays show no evidence of significant hip osteoarthritis and his total knee arthroplasty looks good.  Follow-Up Instructions: No Follow-up on file.   Orders:  Orders Placed This Encounter  Procedures  . XR HIP UNILAT W OR W/O PELVIS 2-3 VIEWS LEFT  . XR Knee 1-2 Views Left   No orders of the defined types were placed in this encounter.     Procedures: Large Joint Inj: L greater trochanter on 03/21/2017 9:35 AM Details: lateral approach Medications: 0.5 mL lidocaine 1 %; 2 mL bupivacaine 0.25 %; 40 mg methylPREDNISolone acetate 40 MG/ML      Clinical Data: No additional findings.   Subjective: Chief Complaint  Patient presents with  . Left Hip - Pain    HPI 67 year old male post L2-3 decompression surgery in March 2017 and left total knee arthroplasty 2013 with several month history of progressive left lateral hip pain that radiates down to his lateral knee.  At times he limps he has problems walking a distance.  His pain rates up to 5 out of 10.  He has not had any erythema or redness either from his knee or  back incision.  He has been on Tylenol he is noticed the Aleve takes the edge off.  He has diabetes and has an insulin monitor on his arm.  Patient is on insulin.  Review of Systems positive for previous left total knee arthroplasty 2013, L2-3 decompression 2017.  Cervical fusion with anterior and then later posterior fusion.  Positive for diabetes, upper lipidemia bilateral carpal tunnel, hypertension, shoulder impingement, ED, elevation of PSA, vitamin D deficiency otherwise negative as it pertains HPI.   Objective: Vital Signs: BP (!) 181/87   Pulse (!) 106   Ht 5\' 10"  (1.778 m)   Wt 220 lb (99.8 kg)   BMI 31.57 kg/m   Physical Exam  Constitutional: He is oriented to person, place, and time. He appears well-developed and well-nourished.  HENT:  Head: Normocephalic and atraumatic.  Eyes: EOM are normal. Pupils are equal, round, and reactive to light.  Neck: No tracheal deviation present. No thyromegaly present.  Cardiovascular: Normal rate.  Pulmonary/Chest: Effort normal. He has no wheezes.  Abdominal: Soft. Bowel sounds are normal.  Neurological: He is alert and oriented to person, place, and time.  Skin: Skin is warm and dry. Capillary refill takes less than 2 seconds.  Psychiatric: He  has a normal mood and affect. His behavior is normal. Judgment and thought content normal.    Ortho Exam patient has some trochanteric tenderness over the left which is moderate to severe negative on the right.  Well-healed lumbar incision.  Left total knee arthroplasty incision with curved incision due to previous medial knee arthrotomy well-healed.  Collateral ligaments are stable no knee effusion negative logroll to the hips.  Plantar foot lesions.  Distal pulses are palpable no cellulitis no pitting edema.  Negative popliteal compression test.  No effusion right knee.  Specialty Comments:  No specialty comments available.  Imaging: Xr Hip Unilat W Or W/o Pelvis 2-3 Views Left  Result Date:  03/21/2017 AP pelvis and frog-leg left hip obtained and reviewed.  This shows normal bony anatomy mild calcification of the femoral artery.  Negative for acute changes no significant hip osteoarthritis. Impression: Normal pelvis and hip x-rays mild vascular calcification no significant hip arthritis negative for acute changes.  Xr Knee 1-2 Views Left  Result Date: 03/21/2017 Standing AP both knees lateral left knee obtained and reviewed.  Patient has well-positioned cemented left total knee arthroplasty without evidence of loosening.  Mild narrowing of the opposite right knee with minimal osteophyte formation.  No acute changes. Impression: Satisfactory total knee arthroplasty, left     PMFS History: Patient Active Problem List   Diagnosis Date Noted  . Trochanteric bursitis, left hip 03/21/2017  . Healthcare maintenance 12/03/2016  . LFT elevation 12/03/2016  . Neck pain 12/03/2016  . Depression 08/16/2015  . Anemia, iron deficiency 05/21/2015  . IDDM (insulin dependent diabetes mellitus) ([facility]) 05/21/2015  . History of lumbar laminectomy for spinal cord decompression 04/28/2015  . Advance care planning 07/17/2014  . Back pain 04/17/2014  . Medicare annual wellness visit, subsequent 12/07/2011  . Osteoarthritis of left knee 11/20/2011    Class: End Stage  . ED (erectile dysfunction) 06/01/2010  . MUSCULAR WASTING AND DISUSE ATROPHY NEC 06/09/2009  . Vitamin D deficiency 01/28/2009  . CARPAL TUNNEL SYNDROME, BILATERAL 07/13/2006  . Diabetes mellitus type II, uncontrolled ([facility]) 06/25/2006  . HLD (hyperlipidemia) 06/25/2006  . Essential hypertension 06/25/2006  . Cervical disc disorder 06/22/2006  . TENDINITIS, CALCIFIC, SHOULDER, LEFT 06/22/2006  . ELEVATED PROSTATE SPECIFIC ANTIGEN 06/22/2006   Past Medical History:  Diagnosis Date  . Arthritis   . Depression    improved on sertraline  . Diabetes mellitus, type 2 ([facility]) 09/1996  . GERD (gastroesophageal reflux disease) 1990  .  History of peptic ulcer disease   . Hyperlipidemia 1992  . Hypertension 05/2003  . Lumbar stenosis L2-3  . Muscle atrophy    in arms from degenerative changes in neck  . Neck pain    chronic  . Smoker   . Wears glasses     Family History  Problem Relation Age of Onset  . Diabetes Mother   . Hypertension Mother   . Stroke Mother   . Diabetes Sister   . Diabetes Brother   . Diabetes Brother   . Heart disease Brother   . Diabetes Father   . Cancer Maternal Grandmother        breast  . Lung disease Paternal Grandfather        black lung  . Arthritis Neg Hx   . Prostate cancer Neg Hx   . Colon cancer Neg Hx     Past Surgical History:  Procedure Laterality Date  . CERVICAL DISC SURGERY  04/19/02   fusion, Dr. Lorin Mercy  .  CERVICAL DISCECTOMY  1997   partial  . CERVICAL DISCECTOMY  2000  . COLONOSCOPY WITH ESOPHAGOGASTRODUODENOSCOPY (EGD)  06/25/2015  . [place]  . KNEE ARTHROPLASTY  11/20/2011   Procedure: COMPUTER ASSISTED TOTAL KNEE ARTHROPLASTY;  Surgeon: Marybelle Killings, MD;  Location: [facility];  Service: Orthopedics;  Laterality: Left;  Conversion Left Knee Medial Uni to Total Knee Arthroplasty-Cemented  . LUMBAR LAMINECTOMY/DECOMPRESSION MICRODISCECTOMY N/A 04/28/2015   Procedure: L2-3 Decompression;  Surgeon: Marybelle Killings, MD;  Location: [facility];  Service: Orthopedics;  Laterality: N/A;  . MR MRA DUPLICATE EXAM  INACTIVATE    . MULTIPLE TOOTH EXTRACTIONS    . Neisen funduplication  7412  . REPLACEMENT TOTAL KNEE  02/14/02, 05/04/02   left- partial  . SCC EXCISION  04/25/01   Dr. March Rummage  . STOMACH SURGERY  1984   PUD, [place]   Social History   Occupational History  . Occupation: Training and development officer, retired    Comment: Scientific laboratory technician  Tobacco Use  . Smoking status: Former Smoker    Packs/day: 0.50    Years: 30.00    Pack years: 15.00    Types: Cigarettes    Last attempt to quit: 07/31/2011    Years since quitting: 5.6  . Smokeless tobacco: Never Used  Substance  and Sexual Activity  . Alcohol use: No    Alcohol/week: 0.0 oz  . Drug use: No  . Sexual activity: No

## 2017-03-23 ENCOUNTER — Other Ambulatory Visit: Payer: Self-pay

## 2017-03-23 ENCOUNTER — Ambulatory Visit (INDEPENDENT_AMBULATORY_CARE_PROVIDER_SITE_OTHER): Payer: Medicare Other | Admitting: Family Medicine

## 2017-03-23 ENCOUNTER — Telehealth: Payer: Self-pay | Admitting: Endocrinology

## 2017-03-23 ENCOUNTER — Encounter: Payer: Self-pay | Admitting: Family Medicine

## 2017-03-23 DIAGNOSIS — M5416 Radiculopathy, lumbar region: Secondary | ICD-10-CM

## 2017-03-23 MED ORDER — TRAMADOL HCL 50 MG PO TABS: 50.0000 mg | ORAL_TABLET | Freq: Three times a day (TID) | ORAL | 1 refills | Status: DC | PRN

## 2017-03-23 MED ORDER — INSULIN GLARGINE 100 UNIT/ML SOLOSTAR PEN: 125.0000 [IU] | PEN_INJECTOR | SUBCUTANEOUS | 99 refills | Status: DC

## 2017-03-23 MED ORDER — INSULIN PEN NEEDLE 32G X 4 MM MISC: 11 refills | Status: DC

## 2017-03-23 MED ORDER — INSULIN ASPART 100 UNIT/ML FLEXPEN: 60.0000 [IU] | PEN_INJECTOR | Freq: Every day | SUBCUTANEOUS | 11 refills | Status: DC

## 2017-03-23 NOTE — Telephone Encounter (Signed)
I called and informed patient that I would resend prescriptions. Plus he wanted a prescription for pen needles & I stated I would [facility].

## 2017-03-23 NOTE — Telephone Encounter (Signed)
Patient called in, stating that he received an email from [facility] regarding the [place] pen prescriptions. The email states that they sent a request for more information to the provider, but have not heard back. He also wanted to clarify that the prescriptions should be for the pens, not the vials.

## 2017-03-23 NOTE — Progress Notes (Signed)
BP elevation attributed to lower back pain.  Taking aleve w/o much relief.  Tramadol helped more, w/o ADE.    He has injected yesterday re: hip pain, with inc in sugar today, 256 on his meter at OV today. He has f/u with endo.  L hip pain better after injection.    L lower back pain, lateral to midline.  Can radiate into the L leg but not the R leg.  No weakness.  No new trauma.     More radicular L leg pain with more walking.   He was asking about a hard brace that may be of benefit for his pain/situation.   Meds, vitals, and allergies reviewed.   ROS: Per HPI unless specifically indicated in ROS section   nad ncat rrr ctab SLR neg B ctab rrr L lower back ttp, midline not ttp  No CVA pain.  Able to bear weight with BLE

## 2017-03-23 NOTE — Patient Instructions (Signed)
I'll work on the papers about the brace.  Use the tramadol if needed.  Update me as needed.  Take care.  Glad to see you.

## 2017-03-25 NOTE — Assessment & Plan Note (Addendum)
He has tried multiple interventions with Celebrex and tramadol, previous laminectomy, injection.  It may be reasonable to try lumbar orthosis from L1-L5 to reduce pain by restricting mobility of the trunk.  Form done.  Continue as is with current medications otherwise. He agrees.  He'll update me as needed.

## 2017-04-10 ENCOUNTER — Other Ambulatory Visit: Payer: Self-pay | Admitting: Family Medicine

## 2017-04-11 NOTE — Telephone Encounter (Signed)
Electronic refill request Last office visit 03/23/17 Last refill 09/12/16 #90/1

## 2017-04-12 DIAGNOSIS — H401131 Primary open-angle glaucoma, bilateral, mild stage: Secondary | ICD-10-CM | POA: Diagnosis not present

## 2017-04-12 DIAGNOSIS — H401133 Primary open-angle glaucoma, bilateral, severe stage: Secondary | ICD-10-CM | POA: Diagnosis not present

## 2017-04-12 DIAGNOSIS — H40023 Open angle with borderline findings, high risk, bilateral: Secondary | ICD-10-CM | POA: Diagnosis not present

## 2017-04-12 NOTE — Telephone Encounter (Signed)
Sent. Thanks.   

## 2017-04-17 ENCOUNTER — Encounter: Payer: Self-pay | Admitting: Endocrinology

## 2017-04-17 ENCOUNTER — Ambulatory Visit (INDEPENDENT_AMBULATORY_CARE_PROVIDER_SITE_OTHER): Payer: Medicare Other | Admitting: Endocrinology

## 2017-04-17 VITALS — BP 156/80 | HR 128 | Wt 233.4 lb

## 2017-04-17 DIAGNOSIS — E11649 Type 2 diabetes mellitus with hypoglycemia without coma: Secondary | ICD-10-CM

## 2017-04-17 LAB — HEMOGLOBIN A1C: HEMOGLOBIN A1C: 9.9 % — AB (ref 4.6–6.5)

## 2017-04-17 MED ORDER — INSULIN ASPART 100 UNIT/ML FLEXPEN: 50.0000 [IU] | PEN_INJECTOR | Freq: Every day | SUBCUTANEOUS | 11 refills | Status: DC

## 2017-04-17 MED ORDER — INSULIN GLARGINE 100 UNIT/ML SOLOSTAR PEN: 140.0000 [IU] | PEN_INJECTOR | SUBCUTANEOUS | 99 refills | Status: DC

## 2017-04-17 NOTE — Patient Instructions (Addendum)
check your blood sugar 4 times a day.  vary the time of day when you check, between before the 3 meals, and at bedtime.  also check if you have symptoms of your blood sugar being too high or too low.  please keep a record of the readings and bring it to your next appointment here (or you can bring the meter itself).  You can write it on any piece of paper.  please call us sooner if your blood sugar goes below 70, or if you have a lot of readings over 200.   A diabetes blood test is requested for you today.  We'll let you know about the results.   Please come back for a follow-up appointment in 2 months.

## 2017-04-17 NOTE — Progress Notes (Signed)
Subjective:    Patient ID: Anthony Cuevas, male    DOB: [birth year], 67 y.o.   MRN: [id number]  HPI Pt returns for f/u of diabetes mellitus: DM type: Insulin-requiring type 2 Dx'ed: 8242 Complications: renal insuff Therapy: insulin since 2006, and several oral meds.   DKA: never Severe hypoglycemia: never Pancreatitis: never Pancreatic imaging: never Other: he stopped pioglitizone, due to edema; he takes 2 QD insulins, after poor results with multiple daily injections.   Interval history: continuous glucose monitor is downloaded, and I have reviewed today.  It varies from 100-309.  It is in general higher as the day goes on. He does not miss the insulin.  Pt says glucose is higher since steroid injection 2 weeks ago.  He wants to use up lantus Past Medical History:  Diagnosis Date  . Arthritis   . Depression    improved on sertraline  . Diabetes mellitus, type 2 ([facility]) 09/1996  . GERD (gastroesophageal reflux disease) 1990  . History of peptic ulcer disease   . Hyperlipidemia 1992  . Hypertension 05/2003  . Lumbar stenosis L2-3  . Muscle atrophy    in arms from degenerative changes in neck  . Neck pain    chronic  . Smoker   . Wears glasses     Past Surgical History:  Procedure Laterality Date  . CERVICAL DISC SURGERY  04/19/02   fusion, Dr. Lorin Mercy  . CERVICAL DISCECTOMY  1997   partial  . CERVICAL DISCECTOMY  2000  . COLONOSCOPY WITH ESOPHAGOGASTRODUODENOSCOPY (EGD)  06/25/2015  . [place]  . KNEE ARTHROPLASTY  11/20/2011   Procedure: COMPUTER ASSISTED TOTAL KNEE ARTHROPLASTY;  Surgeon: Marybelle Killings, MD;  Location: [facility];  Service: Orthopedics;  Laterality: Left;  Conversion Left Knee Medial Uni to Total Knee Arthroplasty-Cemented  . LUMBAR LAMINECTOMY/DECOMPRESSION MICRODISCECTOMY N/A 04/28/2015   Procedure: L2-3 Decompression;  Surgeon: Marybelle Killings, MD;  Location: [facility];  Service: Orthopedics;  Laterality: N/A;  . MR MRA DUPLICATE EXAM  INACTIVATE      . MULTIPLE TOOTH EXTRACTIONS    . Neisen funduplication  3536  . REPLACEMENT TOTAL KNEE  02/14/02, 05/04/02   left- partial  . SCC EXCISION  04/25/01   Dr. March Rummage  . STOMACH SURGERY  1984   PUD, [place]    Social History   Socioeconomic History  . Marital status: Married    Spouse name: Not on file  . Number of children: 3  . Years of education: Not on file  . Highest education level: Not on file  Occupational History  . Occupation: Training and development officer, retired    Comment: Scientific laboratory technician  Social Needs  . Financial resource strain: Not on file  . Food insecurity:    Worry: Not on file    Inability: Not on file  . Transportation needs:    Medical: Not on file    Non-medical: Not on file  Tobacco Use  . Smoking status: Former Smoker    Packs/day: 0.50    Years: 30.00    Pack years: 15.00    Types: Cigarettes    Last attempt to quit: 07/31/2011    Years since quitting: 5.7  . Smokeless tobacco: Never Used  Substance and Sexual Activity  . Alcohol use: No    Alcohol/week: 0.0 oz  . Drug use: No  . Sexual activity: Never  Lifestyle  . Physical activity:    Days per week: Not on file    Minutes  per session: Not on file  . Stress: Not on file  Relationships  . Social connections:    Talks on phone: Not on file    Gets together: Not on file    Attends religious service: Not on file    Active member of club or organization: Not on file    Attends meetings of clubs or organizations: Not on file    Relationship status: Not on file  . Intimate partner violence:    Fear of current or ex partner: Not on file    Emotionally abused: Not on file    Physically abused: Not on file    Forced sexual activity: Not on file  Other Topics Concern  . 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 B-D INS SYR MICROFINE 1CC/28G 28G X 1/2" 1 ML MISC USE TO INJECT INSULIN FOUR TIMES A DAY 400 each 2  . celecoxib (CELEBREX) 200 MG capsule TAKE 1 CAPSULE DAILY 90 capsule 1  . cholecalciferol (VITAMIN D) 1000 UNITS tablet Take 5,000 Units by mouth 2 (two) times a week.    . Continuous Blood Gluc Sensor (FREESTYLE LIBRE 14 DAY SENSOR) MISC 1 Device by Does not apply route every 14 (fourteen) days. 6 each 3  . cyanocobalamin 1000 MCG tablet Take 1,000 mcg by mouth daily.    . cyclobenzaprine (FLEXERIL) 10 MG tablet TAKE 1 TABLET TWICE A DAY AS NEEDED FOR MUSCLE SPASMS 180 tablet 3  . glucose blood (PRODIGY AUTOCODE TEST) test strip Use as instructed to test blood sugar 3-4 times per day    . Insulin Pen Needle 32G X 4 MM MISC Used to give daily insulin injections 4x daily. 100 each 11  . lisinopril (PRINIVIL,ZESTRIL) 20 MG tablet TAKE 1 TABLET DAILY 90 tablet 3  . LUMIGAN 0.01 % SOLN Place 1 drop into both eyes at bedtime.     . metFORMIN (GLUCOPHAGE) 1000 MG tablet Take 1 tablet (1,000 mg total) by mouth 2 (two) times daily with a meal. 180 tablet 3  . NIASPAN 500 MG CR tablet TAKE 1 TABLET AT BEDTIME 90 tablet 1  . Omega-3 Fatty Acids (FISH OIL PO) Take 1,000 mg by mouth once a week.    . pravastatin (PRAVACHOL) 80 MG tablet TAKE 1 TABLET DAILY 90 tablet 2  . traMADol (ULTRAM) 50 MG tablet Take 1 tablet (50 mg total) by mouth every 8 (eight) hours as needed (sedation caution). Chronic joint/back pain 90 tablet 1   No current facility-administered medications on file prior to visit.     No Known Allergies  Family History  Problem Relation Age of Onset  . Diabetes Mother   . Hypertension Mother   . Stroke Mother   . Diabetes Sister   . Diabetes Brother   . Diabetes Brother   . Heart disease Brother   . Diabetes Father   . Cancer Maternal Grandmother        breast  . Lung disease Paternal Grandfather        black lung  . Arthritis Neg Hx    . Prostate cancer Neg Hx   . Colon cancer Neg Hx     BP (!) 156/80 (BP Location: Left Arm, Patient  Position: Sitting, Cuff Size: Normal)   Pulse (!) 128   Wt 233 lb 6.4 oz (105.9 kg)   SpO2 97%   BMI 33.49 kg/m    Review of Systems He denies hypoglycemia    Objective:   Physical Exam VITAL SIGNS:  See vs page GENERAL: no distress Pulses: foot pulses are intact bilaterally.   MSK: no deformity of the feet or ankles.  CV: trace bilat edema of the legs.   Skin:  no ulcer on the feet or ankles.  normal temp on the feet and ankles.  Neuro: sensation is intact to touch on the feet and ankles.   Ext: There is bilateral onychomycosis of the toenails.   Lab Results  Component Value Date   CREATININE 0.99 03/02/2017   BUN 19 03/02/2017   NA 137 03/02/2017   K 4.2 03/02/2017   CL 102 03/02/2017   CO2 25 03/02/2017   Lab Results  Component Value Date   HGBA1C 9.9 (H) 04/17/2017       Assessment & Plan:  Insulin-requiring type 2 DM: he needs increased rx.  Renal insuff: in this setting, he needs a faster-acting qd insulin, but he wants to use up lantus first.  Back pain: steroid rx is affecting a1c.     Patient Instructions  check your blood sugar 4 times a day.  vary the time of day when you check, between before the 3 meals, and at bedtime.  also check if you have symptoms of your blood sugar being too high or too low.  please keep a record of the readings and bring it to your next appointment here (or you can bring the meter itself).  You can write it on any piece of paper.  please call us sooner if your blood sugar goes below 70, or if you have a lot of readings over 200.   A diabetes blood test is requested for you today.  We'll let you know about the results.   Please come back for a follow-up appointment in 2 months.

## 2017-05-26 ENCOUNTER — Other Ambulatory Visit: Payer: Self-pay | Admitting: Family Medicine

## 2017-05-28 NOTE — Telephone Encounter (Signed)
Electronic refill request Last office visit 03/23/17 Last refill 07/23/16 #180/3

## 2017-05-29 NOTE — Telephone Encounter (Signed)
Sent. Thanks.   

## 2017-05-30 ENCOUNTER — Other Ambulatory Visit (INDEPENDENT_AMBULATORY_CARE_PROVIDER_SITE_OTHER): Payer: Medicare Other

## 2017-05-30 DIAGNOSIS — E559 Vitamin D deficiency, unspecified: Secondary | ICD-10-CM

## 2017-05-30 LAB — VITAMIN D 25 HYDROXY (VIT D DEFICIENCY, FRACTURES): VITD: 31.64 ng/mL (ref 30.00–100.00)

## 2017-06-06 ENCOUNTER — Ambulatory Visit (INDEPENDENT_AMBULATORY_CARE_PROVIDER_SITE_OTHER): Payer: Medicare Other | Admitting: Podiatry

## 2017-06-06 ENCOUNTER — Encounter: Payer: Self-pay | Admitting: Podiatry

## 2017-06-06 DIAGNOSIS — M79676 Pain in unspecified toe(s): Secondary | ICD-10-CM | POA: Diagnosis not present

## 2017-06-06 DIAGNOSIS — E119 Type 2 diabetes mellitus without complications: Secondary | ICD-10-CM

## 2017-06-06 DIAGNOSIS — M2010 Hallux valgus (acquired), unspecified foot: Secondary | ICD-10-CM

## 2017-06-06 DIAGNOSIS — B351 Tinea unguium: Secondary | ICD-10-CM

## 2017-06-06 NOTE — Progress Notes (Signed)
Patient ID: Anthony Cuevas, male   DOB: 03/24/1950, 66 y.o.   MRN: [id number] Complaint:  Visit Type: Patient returns to my office for continued preventative foot care services. Complaint: Patient states" my nails have grown long and thick and become painful to walk and wear shoes" Patient has been diagnosed with DM with no foot complications. The patient presents for preventative foot care services. No changes to ROS.    Podiatric Exam: Vascular: dorsalis pedis and posterior tibial pulses are palpable bilateral. Capillary return is immediate. Temperature gradient is WNL. Skin turgor WNL  Sensorium: Diminished  Semmes Weinstein monofilament test. Normal tactile sensation bilaterally. Nail Exam: Pt has thick disfigured discolored nails with subungual debris noted bilateral entire nail hallux through fifth toenails Ulcer Exam: There is no evidence of ulcer or pre-ulcerative changes or infection. Orthopedic Exam: Muscle tone and strength are WNL. No limitations in general ROM. No crepitus or effusions noted. Foot type and digits show no abnormalities. Bony prominences are unremarkable.HAV B/L Skin: No Porokeratosis. 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 The patient understood the discussion of treatment and procedures well. All questions were answered thoroughly reviewed. Debridement of mycotic and hypertrophic toenails, 1 through 5 bilateral and clearing of subungual debris. No ulceration, no infection noted.   ABN signed for 2019. Return Visit-Office Procedure: Patient instructed to return to the office for a follow up visit 3 months for continued evaluation and treatment.    Raniah Karan DPM 

## 2017-06-17 ENCOUNTER — Other Ambulatory Visit: Payer: Self-pay | Admitting: Family Medicine

## 2017-06-20 ENCOUNTER — Encounter: Payer: Self-pay | Admitting: Endocrinology

## 2017-06-20 ENCOUNTER — Ambulatory Visit (INDEPENDENT_AMBULATORY_CARE_PROVIDER_SITE_OTHER): Payer: Medicare Other | Admitting: Endocrinology

## 2017-06-20 VITALS — BP 138/68 | HR 99 | Wt 225.4 lb

## 2017-06-20 DIAGNOSIS — E11649 Type 2 diabetes mellitus with hypoglycemia without coma: Secondary | ICD-10-CM

## 2017-06-20 LAB — POCT GLYCOSYLATED HEMOGLOBIN (HGB A1C): HEMOGLOBIN A1C: 7.7 % — AB (ref 4.0–5.6)

## 2017-06-20 MED ORDER — INSULIN GLARGINE 100 UNIT/ML SOLOSTAR PEN: 130.0000 [IU] | PEN_INJECTOR | SUBCUTANEOUS | 99 refills | Status: DC

## 2017-06-20 MED ORDER — INSULIN ASPART 100 UNIT/ML FLEXPEN: 60.0000 [IU] | PEN_INJECTOR | Freq: Every day | SUBCUTANEOUS | 11 refills | Status: DC

## 2017-06-20 NOTE — Patient Instructions (Signed)
check your blood sugar 4 times a day.  vary the time of day when you check, between before the 3 meals, and at bedtime.  also check if you have symptoms of your blood sugar being too high or too low.  please keep a record of the readings and bring it to your next appointment here (or you can bring the meter itself).  You can write it on any piece of paper.  please call us sooner if your blood sugar goes below 70, or if you have a lot of readings over 200.   Please change the insulins to the numbers listed below.  Please come back for a follow-up appointment in 3 months.

## 2017-06-20 NOTE — Progress Notes (Signed)
Subjective:    Patient ID: Anthony Cuevas, male    DOB: 03/26/1950, 67 y.o.   MRN: [id number]  HPI Pt returns for f/u of diabetes mellitus: DM type: Insulin-requiring type 2 Dx'ed: 7353 Complications: renal insuff Therapy: insulin since 2006, and metformin   DKA: never Severe hypoglycemia: never Pancreatitis: never Pancreatic imaging: never Other: he stopped pioglitizone, due to edema; he takes 2 QD insulins, after poor results with multiple daily injections.   Interval history: continuous glucose monitor data are reviewed today, and the printout is scanned into the record.  It varies from 50-340.  It is in general higher as the day goes on.   Past Medical History:  Diagnosis Date  . Arthritis   . Depression    improved on sertraline  . Diabetes mellitus, type 2 ([place]) 09/1996  . GERD (gastroesophageal reflux disease) 1990  . History of peptic ulcer disease   . Hyperlipidemia 1992  . Hypertension 05/2003  . Lumbar stenosis L2-3  . Muscle atrophy    in arms from degenerative changes in neck  . Neck pain    chronic  . Smoker   . Wears glasses     Past Surgical History:  Procedure Laterality Date  . CERVICAL DISC SURGERY  04/19/02   fusion, Dr. Lorin Mercy  . CERVICAL DISCECTOMY  1997   partial  . CERVICAL DISCECTOMY  2000  . COLONOSCOPY WITH ESOPHAGOGASTRODUODENOSCOPY (EGD)  06/25/2015  . [place]  . KNEE ARTHROPLASTY  11/20/2011   Procedure: COMPUTER ASSISTED TOTAL KNEE ARTHROPLASTY;  Surgeon: Marybelle Killings, MD;  Location: [facility];  Service: Orthopedics;  Laterality: Left;  Conversion Left Knee Medial Uni to Total Knee Arthroplasty-Cemented  . LUMBAR LAMINECTOMY/DECOMPRESSION MICRODISCECTOMY N/A 04/28/2015   Procedure: L2-3 Decompression;  Surgeon: Marybelle Killings, MD;  Location: [facility];  Service: Orthopedics;  Laterality: N/A;  . MR MRA DUPLICATE EXAM  INACTIVATE    . MULTIPLE TOOTH EXTRACTIONS    . Neisen funduplication  2992  . REPLACEMENT TOTAL KNEE  02/14/02,  05/04/02   left- partial  . SCC EXCISION  04/25/01   Dr. March Rummage  . STOMACH SURGERY  1984   PUD, [place]    Social History   Socioeconomic History  . Marital status: Married    Spouse name: Not on file  . Number of children: 3  . Years of education: Not on file  . Highest education level: Not on file  Occupational History  . Occupation: Training and development officer, retired    Comment: Scientific laboratory technician  Social Needs  . Financial resource strain: Not on file  . Food insecurity:    Worry: Not on file    Inability: Not on file  . Transportation needs:    Medical: Not on file    Non-medical: Not on file  Tobacco Use  . Smoking status: Former Smoker    Packs/day: 0.50    Years: 30.00    Pack years: 15.00    Types: Cigarettes    Last attempt to quit: 07/31/2011    Years since quitting: 5.9  . Smokeless tobacco: Never Used  Substance and Sexual Activity  . Alcohol use: No    Alcohol/week: 0.0 oz  . Drug use: No  . Sexual activity: Never  Lifestyle  . Physical activity:    Days per week: Not on file    Minutes per session: Not on file  . Stress: Not on file  Relationships  . Social connections:    Talks  on phone: Not on file    Gets together: Not on file    Attends religious service: Not on file    Active member of club or organization: Not on file    Attends meetings of clubs or organizations: Not on file    Relationship status: Not on file  . Intimate partner violence:    Fear of current or ex partner: Not on file    Emotionally abused: Not on file    Physically abused: Not on file    Forced sexual activity: Not on file  Other Topics Concern  . 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 B-D INS SYR MICROFINE 1CC/28G 28G X 1/2" 1 ML MISC USE  TO INJECT INSULIN FOUR TIMES A DAY 400 each 2  . celecoxib (CELEBREX) 200 MG capsule TAKE 1 CAPSULE DAILY 90 capsule 1  . cholecalciferol (VITAMIN D) 1000 UNITS tablet Take 5,000 Units by mouth 2 (two) times a week.    . Continuous Blood Gluc Sensor (FREESTYLE LIBRE 14 DAY SENSOR) MISC 1 Device by Does not apply route every 14 (fourteen) days. 6 each 3  . cyanocobalamin 1000 MCG tablet Take 1,000 mcg by mouth daily.    . cyclobenzaprine (FLEXERIL) 10 MG tablet TAKE 1 TABLET TWICE A DAY AS NEEDED FOR MUSCLE SPASMS 180 tablet 3  . glucose blood (PRODIGY AUTOCODE TEST) test strip Use as instructed to test blood sugar 3-4 times per day    . Insulin Pen Needle 32G X 4 MM MISC Used to give daily insulin injections 4x daily. 100 each 11  . lisinopril (PRINIVIL,ZESTRIL) 20 MG tablet TAKE 1 TABLET DAILY 90 tablet 3  . LUMIGAN 0.01 % SOLN Place 1 drop into both eyes at bedtime.     . metFORMIN (GLUCOPHAGE) 1000 MG tablet Take 1 tablet (1,000 mg total) by mouth 2 (two) times daily with a meal. 180 tablet 3  . NIASPAN 500 MG CR tablet TAKE 1 TABLET AT BEDTIME 90 tablet 1  . Omega-3 Fatty Acids (FISH OIL PO) Take 1,000 mg by mouth once a week.    . pravastatin (PRAVACHOL) 80 MG tablet TAKE 1 TABLET DAILY 90 tablet 2  . traMADol (ULTRAM) 50 MG tablet Take 1 tablet (50 mg total) by mouth every 8 (eight) hours as needed (sedation caution). Chronic joint/back pain 90 tablet 1   No current facility-administered medications on file prior to visit.     No Known Allergies  Family History  Problem Relation Age of Onset  . Diabetes Mother   . Hypertension Mother   . Stroke Mother   . Diabetes Sister   . Diabetes Brother   . Diabetes Brother   . Heart disease Brother   . Diabetes Father   . Cancer Maternal Grandmother        breast  . Lung disease Paternal Grandfather        black lung  . Arthritis Neg Hx   . Prostate cancer Neg Hx   . Colon cancer Neg Hx     BP 138/68   Pulse 99   Wt 225 lb 6.4 oz  (102.2 kg)   SpO2 97%   BMI 32.34 kg/m    Review of  Systems He denies LOC.      Objective:   Physical Exam VITAL SIGNS:  See vs page GENERAL: no distress Pulses: foot pulses are intact bilaterally.   MSK: no deformity of the feet or ankles.  CV: trace bilat edema of the legs.   Skin:  no ulcer on the feet or ankles.  normal temp on the feet and ankles.  Neuro: sensation is intact to touch on the feet and ankles.   Ext: There is severe bilateral onychomycosis of the toenails.    Lab Results  Component Value Date   HGBA1C 7.7 (A) 06/20/2017       Assessment & Plan:  Insulin-requiring type 2 DM, with renal insuff: The pattern of his cbg's indicates he needs some adjustment in his therapy  Patient Instructions  check your blood sugar 4 times a day.  vary the time of day when you check, between before the 3 meals, and at bedtime.  also check if you have symptoms of your blood sugar being too high or too low.  please keep a record of the readings and bring it to your next appointment here (or you can bring the meter itself).  You can write it on any piece of paper.  please call us sooner if your blood sugar goes below 70, or if you have a lot of readings over 200.   Please change the insulins to the numbers listed below.  Please come back for a follow-up appointment in 3 months.

## 2017-06-27 ENCOUNTER — Other Ambulatory Visit: Payer: Self-pay | Admitting: *Deleted

## 2017-06-27 MED ORDER — TRAMADOL HCL 50 MG PO TABS: 50.0000 mg | ORAL_TABLET | Freq: Three times a day (TID) | ORAL | 1 refills | Status: DC | PRN

## 2017-06-27 NOTE — Telephone Encounter (Signed)
Sent. Thanks.   

## 2017-06-27 NOTE — Telephone Encounter (Signed)
Faxed refill request from [facility] for Tramadol Last refill 03/23/17 #90/1 Last office visit 03/23/17

## 2017-08-06 ENCOUNTER — Other Ambulatory Visit: Payer: Self-pay | Admitting: *Deleted

## 2017-08-06 NOTE — Telephone Encounter (Addendum)
Faxed refill request.  Name of Medication: Tramadol Name of Pharmacy: [facility] Last Fill or Written Date and Quantity:  90 tablet 1 06/27/2017  Last Office Visit and Type: 03/23/17 Lumbar Radiculopathy Next Office Visit and Type: 12/03/17 CPE Last Controlled Substance Agreement Date: None Last UDS:  None

## 2017-08-07 NOTE — Telephone Encounter (Signed)
Patient says he has enough medication to last until this Rx arrives.  It comes from mail order and sometimes takes 8-10 days to arrive at his home.

## 2017-08-07 NOTE — Telephone Encounter (Signed)
This looks to be a few weeks early.  What is his status?  Let me know.  Thanks.

## 2017-08-08 MED ORDER — TRAMADOL HCL 50 MG PO TABS: 50.0000 mg | ORAL_TABLET | Freq: Three times a day (TID) | ORAL | 2 refills | Status: DC | PRN

## 2017-08-08 NOTE — Telephone Encounter (Signed)
Noted. Thanks.  Sent.  

## 2017-08-10 ENCOUNTER — Other Ambulatory Visit: Payer: Self-pay | Admitting: *Deleted

## 2017-08-14 DIAGNOSIS — H401131 Primary open-angle glaucoma, bilateral, mild stage: Secondary | ICD-10-CM | POA: Diagnosis not present

## 2017-08-14 DIAGNOSIS — H16213 Exposure keratoconjunctivitis, bilateral: Secondary | ICD-10-CM | POA: Diagnosis not present

## 2017-08-14 DIAGNOSIS — H524 Presbyopia: Secondary | ICD-10-CM | POA: Diagnosis not present

## 2017-08-14 DIAGNOSIS — E119 Type 2 diabetes mellitus without complications: Secondary | ICD-10-CM | POA: Diagnosis not present

## 2017-08-14 DIAGNOSIS — H2513 Age-related nuclear cataract, bilateral: Secondary | ICD-10-CM | POA: Diagnosis not present

## 2017-08-14 DIAGNOSIS — H04123 Dry eye syndrome of bilateral lacrimal glands: Secondary | ICD-10-CM | POA: Diagnosis not present

## 2017-08-14 LAB — HM DIABETES EYE EXAM

## 2017-08-24 ENCOUNTER — Encounter: Payer: Self-pay | Admitting: Family Medicine

## 2017-09-05 ENCOUNTER — Telehealth: Payer: Self-pay | Admitting: Endocrinology

## 2017-09-05 ENCOUNTER — Other Ambulatory Visit: Payer: Self-pay

## 2017-09-05 MED ORDER — FREESTYLE LIBRE 14 DAY SENSOR MISC: 1.0000 | 3 refills | Status: DC

## 2017-09-05 NOTE — Telephone Encounter (Signed)
Patient dropped off form for [employer] 14 day-needs RX sent to [facility] at [place]. Patient's reader broke but he thinks they are sending it in the mail.

## 2017-09-05 NOTE — Telephone Encounter (Signed)
I have sent prescription for [facility] sensors to pharmacy.

## 2017-09-07 ENCOUNTER — Ambulatory Visit: Payer: Medicare Other | Admitting: Podiatry

## 2017-09-14 ENCOUNTER — Ambulatory Visit (INDEPENDENT_AMBULATORY_CARE_PROVIDER_SITE_OTHER): Payer: Medicare Other | Admitting: Podiatry

## 2017-09-14 ENCOUNTER — Encounter: Payer: Self-pay | Admitting: Podiatry

## 2017-09-14 DIAGNOSIS — E119 Type 2 diabetes mellitus without complications: Secondary | ICD-10-CM | POA: Diagnosis not present

## 2017-09-14 DIAGNOSIS — B351 Tinea unguium: Secondary | ICD-10-CM | POA: Diagnosis not present

## 2017-09-14 DIAGNOSIS — M79676 Pain in unspecified toe(s): Secondary | ICD-10-CM

## 2017-09-14 DIAGNOSIS — M2010 Hallux valgus (acquired), unspecified foot: Secondary | ICD-10-CM

## 2017-09-14 NOTE — Progress Notes (Signed)
Patient ID: Anthony Cuevas, male   DOB: 11/12/1950, 66 y.o.   MRN: [id number] Complaint:  Visit Type: Patient returns to my office for continued preventative foot care services. Complaint: Patient states" my nails have grown long and thick and become painful to walk and wear shoes" Patient has been diagnosed with DM with no foot complications. The patient presents for preventative foot care services. No changes to ROS.    Podiatric Exam: Vascular: dorsalis pedis and posterior tibial pulses are palpable bilateral. Capillary return is immediate. Temperature gradient is WNL. Skin turgor WNL  Sensorium: Diminished  Semmes Weinstein monofilament test. Normal tactile sensation bilaterally. Nail Exam: Pt has thick disfigured discolored nails with subungual debris noted bilateral entire nail hallux through fifth toenails Ulcer Exam: There is no evidence of ulcer or pre-ulcerative changes or infection. Orthopedic Exam: Muscle tone and strength are WNL. No limitations in general ROM. No crepitus or effusions noted. Foot type and digits show no abnormalities. Bony prominences are unremarkable.HAV B/L Skin: No Porokeratosis. 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 The patient understood the discussion of treatment and procedures well. All questions were answered thoroughly reviewed. Debridement of mycotic and hypertrophic toenails, 1 through 5 bilateral and clearing of subungual debris. No ulceration, no infection noted.   ABN signed for 2019. Return Visit-Office Procedure: Patient instructed to return to the office for a follow up visit 3 months for continued evaluation and treatment.    Lurlean Kernen DPM 

## 2017-09-16 ENCOUNTER — Other Ambulatory Visit: Payer: Self-pay | Admitting: Family Medicine

## 2017-09-17 NOTE — Telephone Encounter (Signed)
Electronic refill request Last refill 04/12/17 #90/1 Last office visit 03/23/17

## 2017-09-18 NOTE — Telephone Encounter (Signed)
Sent. Thanks.   

## 2017-09-24 ENCOUNTER — Ambulatory Visit (INDEPENDENT_AMBULATORY_CARE_PROVIDER_SITE_OTHER): Payer: Medicare Other | Admitting: Endocrinology

## 2017-09-24 VITALS — BP 142/84 | HR 99 | Temp 98.2°F | Resp 16 | Wt 217.0 lb

## 2017-09-24 DIAGNOSIS — E11649 Type 2 diabetes mellitus with hypoglycemia without coma: Secondary | ICD-10-CM | POA: Diagnosis not present

## 2017-09-24 LAB — POCT GLYCOSYLATED HEMOGLOBIN (HGB A1C): HEMOGLOBIN A1C: 6.6 % — AB (ref 4.0–5.6)

## 2017-09-24 MED ORDER — INSULIN GLARGINE 100 UNIT/ML SOLOSTAR PEN: 110.0000 [IU] | PEN_INJECTOR | SUBCUTANEOUS | 99 refills | Status: DC

## 2017-09-24 MED ORDER — INSULIN ASPART 100 UNIT/ML FLEXPEN: 70.0000 [IU] | PEN_INJECTOR | Freq: Every day | SUBCUTANEOUS | 11 refills | Status: DC

## 2017-09-24 NOTE — Progress Notes (Signed)
Subjective:    Patient ID: Anthony Cuevas, male    DOB: 04-02-1950, 67 y.o.   MRN: [id number]  HPI Pt returns for f/u of diabetes mellitus: DM type: Insulin-requiring type 2 Dx'ed: 1884 Complications: renal insuff Therapy: insulin since 2006, and metformin   DKA: never Severe hypoglycemia: never Pancreatitis: never Pancreatic imaging: never Other: he stopped pioglitizone, due to edema; he takes 2 QD insulins, after poor results with multiple daily injections.   Interval history: continuous glucose monitor data are reviewed today, and the printout is scanned into the record.  It varies from 50-300.  It is in general higher as the day goes on.  Past Medical History:  Diagnosis Date  . Arthritis   . Depression    improved on sertraline  . Diabetes mellitus, type 2 ([place]) 09/1996  . GERD (gastroesophageal reflux disease) 1990  . History of peptic ulcer disease   . Hyperlipidemia 1992  . Hypertension 05/2003  . Lumbar stenosis L2-3  . Muscle atrophy    in arms from degenerative changes in neck  . Neck pain    chronic  . Smoker   . Wears glasses     Past Surgical History:  Procedure Laterality Date  . CERVICAL DISC SURGERY  04/19/02   fusion, Dr. Lorin Mercy  . CERVICAL DISCECTOMY  1997   partial  . CERVICAL DISCECTOMY  2000  . COLONOSCOPY WITH ESOPHAGOGASTRODUODENOSCOPY (EGD)  06/25/2015  . [place]  . KNEE ARTHROPLASTY  11/20/2011   Procedure: COMPUTER ASSISTED TOTAL KNEE ARTHROPLASTY;  Surgeon: Marybelle Killings, MD;  Location: [facility];  Service: Orthopedics;  Laterality: Left;  Conversion Left Knee Medial Uni to Total Knee Arthroplasty-Cemented  . LUMBAR LAMINECTOMY/DECOMPRESSION MICRODISCECTOMY N/A 04/28/2015   Procedure: L2-3 Decompression;  Surgeon: Marybelle Killings, MD;  Location: [facility];  Service: Orthopedics;  Laterality: N/A;  . MR MRA DUPLICATE EXAM  INACTIVATE    . MULTIPLE TOOTH EXTRACTIONS    . Neisen funduplication  1660  . REPLACEMENT TOTAL KNEE  02/14/02,  05/04/02   left- partial  . SCC EXCISION  04/25/01   Dr. March Rummage  . STOMACH SURGERY  1984   PUD, [place]    Social History   Socioeconomic History  . Marital status: Married    Spouse name: Not on file  . Number of children: 3  . Years of education: Not on file  . Highest education level: Not on file  Occupational History  . Occupation: Training and development officer, retired    Comment: Scientific laboratory technician  Social Needs  . Financial resource strain: Not on file  . Food insecurity:    Worry: Not on file    Inability: Not on file  . Transportation needs:    Medical: Not on file    Non-medical: Not on file  Tobacco Use  . Smoking status: Former Smoker    Packs/day: 0.50    Years: 30.00    Pack years: 15.00    Types: Cigarettes    Last attempt to quit: 07/31/2011    Years since quitting: 6.1  . Smokeless tobacco: Never Used  Substance and Sexual Activity  . Alcohol use: No    Alcohol/week: 0.0 standard drinks  . Drug use: No  . Sexual activity: Never  Lifestyle  . Physical activity:    Days per week: Not on file    Minutes per session: Not on file  . Stress: Not on file  Relationships  . Social connections:    Talks  on phone: Not on file    Gets together: Not on file    Attends religious service: Not on file    Active member of club or organization: Not on file    Attends meetings of clubs or organizations: Not on file    Relationship status: Not on file  . Intimate partner violence:    Fear of current or ex partner: Not on file    Emotionally abused: Not on file    Physically abused: Not on file    Forced sexual activity: Not on file  Other Topics Concern  . 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 B-D INS SYR MICROFINE 1CC/28G 28G X 1/2" 1  ML MISC USE TO INJECT INSULIN FOUR TIMES A DAY 400 each 2  . celecoxib (CELEBREX) 200 MG capsule TAKE 1 CAPSULE DAILY 90 capsule 4  . cholecalciferol (VITAMIN D) 1000 UNITS tablet Take 5,000 Units by mouth 2 (two) times a week.    . Continuous Blood Gluc Sensor (FREESTYLE LIBRE 14 DAY SENSOR) MISC 1 Device by Does not apply route every 14 (fourteen) days. 6 each 3  . cyanocobalamin 1000 MCG tablet Take 1,000 mcg by mouth daily.    . cyclobenzaprine (FLEXERIL) 10 MG tablet TAKE 1 TABLET TWICE A DAY AS NEEDED FOR MUSCLE SPASMS 180 tablet 3  . glucose blood (PRODIGY AUTOCODE TEST) test strip Use as instructed to test blood sugar 3-4 times per day    . Insulin Pen Needle 32G X 4 MM MISC Used to give daily insulin injections 4x daily. 100 each 11  . lisinopril (PRINIVIL,ZESTRIL) 20 MG tablet TAKE 1 TABLET DAILY 90 tablet 3  . LUMIGAN 0.01 % SOLN Place 1 drop into both eyes at bedtime.     . metFORMIN (GLUCOPHAGE) 1000 MG tablet Take 1 tablet (1,000 mg total) by mouth 2 (two) times daily with a meal. 180 tablet 3  . NIASPAN 500 MG CR tablet TAKE 1 TABLET AT BEDTIME 90 tablet 1  . Omega-3 Fatty Acids (FISH OIL PO) Take 1,000 mg by mouth once a week.    . pravastatin (PRAVACHOL) 80 MG tablet TAKE 1 TABLET DAILY 90 tablet 2  . traMADol (ULTRAM) 50 MG tablet Take 1 tablet (50 mg total) by mouth every 8 (eight) hours as needed (sedation caution). Chronic joint/back pain 90 tablet 2   No current facility-administered medications on file prior to visit.     No Known Allergies  Family History  Problem Relation Age of Onset  . Diabetes Mother   . Hypertension Mother   . Stroke Mother   . Diabetes Sister   . Diabetes Brother   . Diabetes Brother   . Heart disease Brother   . Diabetes Father   . Cancer Maternal Grandmother        breast  . Lung disease Paternal Grandfather        black lung  . Arthritis Neg Hx   . Prostate cancer Neg Hx   . Colon cancer Neg Hx     BP (!) 142/84   Pulse 99    Temp 98.2 F (36.8 C) (Oral)   Resp 16   Wt 217 lb (98.4 kg)   SpO2  98%   BMI 31.14 kg/m    Review of Systems Denies LOC    Objective:   Physical Exam VITAL SIGNS:  See vs page GENERAL: no distress Pulses: foot pulses are intact bilaterally.   MSK: no deformity of the feet or ankles.  CV: trace bilat edema of the legs.   Skin:  no ulcer on the feet or ankles.  normal temp on the feet and ankles.  Neuro: sensation is intact to touch on the feet and ankles.   Ext: There is severe bilateral onychomycosis of the toenails.   Lab Results  Component Value Date   HGBA1C 6.6 (A) 09/24/2017       Assessment & Plan:  Insulin-requiring type 2 DM: overcontrolled, given this regimen, which does match insulin to his changing needs throughout the day Hypoglycemia: The pattern of his cbg's indicates he needs some adjustment in his therapy. Renal insuff: in this setting, he is at risk for nocturnal hypoglycemia.  HTN: is noted today  Patient Instructions  Your blood pressure is high today.  Please see your primary care provider soon, to have it rechecked check your blood sugar 4 times a day.  vary the time of day when you check, between before the 3 meals, and at bedtime.  also check if you have symptoms of your blood sugar being too high or too low.  please keep a record of the readings and bring it to your next appointment here (or you can bring the meter itself).  You can write it on any piece of paper.  please call us sooner if your blood sugar goes below 70, or if you have a lot of readings over 200.   Please change the insulins to the numbers listed below.  Please come back for a follow-up appointment in 3 months.

## 2017-09-24 NOTE — Patient Instructions (Addendum)
Your blood pressure is high today.  Please see your primary care provider soon, to have it rechecked check your blood sugar 4 times a day.  vary the time of day when you check, between before the 3 meals, and at bedtime.  also check if you have symptoms of your blood sugar being too high or too low.  please keep a record of the readings and bring it to your next appointment here (or you can bring the meter itself).  You can write it on any piece of paper.  please call us sooner if your blood sugar goes below 70, or if you have a lot of readings over 200.   Please change the insulins to the numbers listed below.  Please come back for a follow-up appointment in 3 months.

## 2017-09-25 ENCOUNTER — Ambulatory Visit: Payer: Medicare Other | Admitting: Endocrinology

## 2017-11-05 ENCOUNTER — Other Ambulatory Visit: Payer: Self-pay | Admitting: *Deleted

## 2017-11-05 NOTE — Telephone Encounter (Signed)
Faxed refill request. Tramadol Last office visit:   03/23/2017 Last Filled:    90 tablet 2 08/08/2017  Please advise.

## 2017-11-06 MED ORDER — TRAMADOL HCL 50 MG PO TABS: 50.0000 mg | ORAL_TABLET | Freq: Three times a day (TID) | ORAL | 2 refills | Status: DC | PRN

## 2017-11-06 NOTE — Telephone Encounter (Signed)
Sent. Thanks.   

## 2017-11-26 ENCOUNTER — Ambulatory Visit (INDEPENDENT_AMBULATORY_CARE_PROVIDER_SITE_OTHER): Payer: Medicare Other

## 2017-11-26 ENCOUNTER — Other Ambulatory Visit: Payer: Self-pay | Admitting: Family Medicine

## 2017-11-26 VITALS — BP 150/86 | HR 65 | Temp 98.0°F | Ht 69.0 in | Wt 219.5 lb

## 2017-11-26 DIAGNOSIS — E785 Hyperlipidemia, unspecified: Secondary | ICD-10-CM

## 2017-11-26 DIAGNOSIS — Z0000 Encounter for general adult medical examination without abnormal findings: Secondary | ICD-10-CM | POA: Diagnosis not present

## 2017-11-26 DIAGNOSIS — Z23 Encounter for immunization: Secondary | ICD-10-CM | POA: Diagnosis not present

## 2017-11-26 LAB — COMPREHENSIVE METABOLIC PANEL
ALT: 45 U/L (ref 0–53)
AST: 35 U/L (ref 0–37)
Albumin: 4.2 g/dL (ref 3.5–5.2)
Alkaline Phosphatase: 63 U/L (ref 39–117)
BUN: 17 mg/dL (ref 6–23)
CALCIUM: 9.3 mg/dL (ref 8.4–10.5)
CHLORIDE: 101 meq/L (ref 96–112)
CO2: 28 meq/L (ref 19–32)
Creatinine, Ser: 0.98 mg/dL (ref 0.40–1.50)
GFR: 81.35 mL/min (ref >60.00)
Glucose, Bld: 262 mg/dL — ABNORMAL HIGH (ref 70–99)
POTASSIUM: 4.3 meq/L (ref 3.5–5.1)
Sodium: 138 mEq/L (ref 135–145)
Total Bilirubin: 0.8 mg/dL (ref 0.2–1.2)
Total Protein: 7.3 g/dL (ref 6.0–8.3)

## 2017-11-26 LAB — LIPID PANEL
CHOL/HDL RATIO: 4
Cholesterol: 131 mg/dL (ref 0–200)
HDL: 33 mg/dL — AB (ref >39.00)
NonHDL: 97.92
TRIGLYCERIDES: 207 mg/dL — AB (ref 0.0–149.0)
VLDL: 41.4 mg/dL — AB (ref 0.0–40.0)

## 2017-11-26 LAB — LDL CHOLESTEROL, DIRECT: LDL DIRECT: 79 mg/dL

## 2017-11-26 NOTE — Progress Notes (Signed)
PCP notes:   Health maintenance:  PPSV23 - administered Flu vaccine - administered  Abnormal screenings:   None  Patient concerns:   None  Nurse concerns:  None  Next PCP appt:   12/03/17 @ 0945  I reviewed health advisor's note, was available for consultation on the day of service listed in this note, and agree with documentation and plan. Elsie Stain, MD.

## 2017-11-26 NOTE — Patient Instructions (Addendum)
Anthony Cuevas , Thank you for taking time to come for your Medicare Wellness Visit. I appreciate your ongoing commitment to your health goals. Please review the following plan we discussed and let me know if I can assist you in the future.   These are the goals we discussed: Goals    . Increase physical activity     Starting 11/26/2017, I will continue to walk at least 30 min 2 days per week as tolerated.        This is a list of the screening recommended for you and due dates:  Health Maintenance  Topic Date Due  . Hemoglobin A1C  03/27/2018  . Eye exam for diabetics  08/15/2018  . Complete foot exam   09/25/2018  . Pneumonia vaccines (2 of 2 - PPSV23) 11/27/2018  . Colon Cancer Screening  06/24/2020  . Tetanus Vaccine  12/06/2021  . Flu Shot  Completed  .  Hepatitis C: One time screening is recommended by Center for Disease Control  (CDC) for  adults born from [birth year] through 1964.   Completed  . HIV Screening  Completed   Preventive Care for Adults  A healthy lifestyle and preventive care can promote health and wellness. Preventive health guidelines for adults include the following key practices.  . A routine yearly physical is a good way to check with your health care provider about your health and preventive screening. It is a chance to share any concerns and updates on your health and to receive a thorough exam.  . Visit your dentist for a routine exam and preventive care every 6 months. Brush your teeth twice a day and floss once a day. Good oral hygiene prevents tooth decay and gum disease.  . The frequency of eye exams is based on your age, health, family medical history, use  of contact lenses, and other factors. Follow your health care provider's recommendations for frequency of eye exams.  . Eat a healthy diet. Foods like vegetables, fruits, whole grains, low-fat dairy products, and lean protein foods contain the nutrients you need without too many calories. Decrease your  intake of foods high in solid fats, added sugars, and salt. Eat the right amount of calories for you. Get information about a proper diet from your health care provider, if necessary.  . Regular physical exercise is one of the most important things you can do for your health. Most adults should get at least 150 minutes of moderate-intensity exercise (any activity that increases your heart rate and causes you to sweat) each week. In addition, most adults need muscle-strengthening exercises on 2 or more days a week.  Silver Sneakers may be a benefit available to you. To determine eligibility, you may visit the website: www.silversneakers.com or contact program at 910-762-8739 Mon-Fri between 8AM-8PM.   . Maintain a healthy weight. The body mass index (BMI) is a screening tool to identify possible weight problems. It provides an estimate of body fat based on height and weight. Your health care provider can find your BMI and can help you achieve or maintain a healthy weight.   For adults 20 years and older: ? A BMI below 18.5 is considered underweight. ? A BMI of 18.5 to 24.9 is normal. ? A BMI of 25 to 29.9 is considered overweight. ? A BMI of 30 and above is considered obese.   . Maintain normal blood lipids and cholesterol levels by exercising and minimizing your intake of saturated fat. Eat a balanced diet with plenty  of fruit and vegetables. Blood tests for lipids and cholesterol should begin at age 90 and be repeated every 5 years. If your lipid or cholesterol levels are high, you are over 50, or you are at high risk for heart disease, you may need your cholesterol levels checked more frequently. Ongoing high lipid and cholesterol levels should be treated with medicines if diet and exercise are not working.  . If you smoke, find out from your health care provider how to quit. If you do not use tobacco, please do not start.  . If you choose to drink alcohol, please do not consume more than 2  drinks per day. One drink is considered to be 12 ounces (355 mL) of beer, 5 ounces (148 mL) of wine, or 1.5 ounces (44 mL) of liquor.  . If you are 22-89 years old, ask your health care provider if you should take aspirin to prevent strokes.  . Use sunscreen. Apply sunscreen liberally and repeatedly throughout the day. You should seek shade when your shadow is shorter than you. Protect yourself by wearing long sleeves, pants, a wide-brimmed hat, and sunglasses year round, whenever you are outdoors.  . Once a month, do a whole body skin exam, using a mirror to look at the skin on your back. Tell your health care provider of new moles, moles that have irregular borders, moles that are larger than a pencil eraser, or moles that have changed in shape or color.

## 2017-11-26 NOTE — Progress Notes (Signed)
Subjective:   Anthony Cuevas is a 67 y.o. male who presents for Medicare Annual/Subsequent preventive examination.  Review of Systems:  N/A Cardiac Risk Factors include: advanced age (>75men, >14 women);diabetes mellitus;hypertension;male gender;obesity (BMI >30kg/m2);dyslipidemia     Objective:    Vitals: BP (!) 150/86 (BP Location: Left Arm, Patient Position: Sitting, Cuff Size: Normal) Comment: BP medications not taken  Pulse 65   Temp 98 F (36.7 C) (Oral)   Ht 5\' 9"  (1.753 m) Comment: shoes  Wt 219 lb 8 oz (99.6 kg)   SpO2 98%   BMI 32.41 kg/m   Body mass index is 32.41 kg/m.  Advanced Directives 11/26/2017 11/23/2016 08/06/2015 06/25/2015 04/28/2015 04/27/2015 11/20/2011  Does Patient Have a Medical Advance Directive? No No No No No No Patient does not have advance directive  Would patient like information on creating a medical advance directive? No - Patient declined Yes (MAU/Ambulatory/Procedural Areas - Information given) Yes - Educational materials given No - patient declined information No - patient declined information No - patient declined information -  Pre-existing out of facility DNR order (yellow form or pink MOST form) - - - - - - No    Tobacco Social History   Tobacco Use  Smoking Status Former Smoker  . Packs/day: 0.50  . Years: 30.00  . Pack years: 15.00  . Types: Cigarettes  . Last attempt to quit: 07/31/2011  . Years since quitting: 6.3  Smokeless Tobacco Never Used     Counseling given: No   Clinical Intake:  Pre-visit preparation completed: Yes  Pain : No/denies pain Pain Score: 0-No pain     Nutritional Status: BMI > 30  Obese Nutritional Risks: None Diabetes: No  How often do you need to have someone help you when you read instructions, pamphlets, or other written materials from your doctor or pharmacy?: 1 - Never What is the last grade level you completed in school?: GED  Interpreter Needed?: No  Comments: pt lives with  spouse Information entered by :: LPinson, LPN  Past Medical History:  Diagnosis Date  . Arthritis   . Depression    improved on sertraline  . Diabetes mellitus, type 2 ([place]) 09/1996  . GERD (gastroesophageal reflux disease) 1990  . History of peptic ulcer disease   . Hyperlipidemia 1992  . Hypertension 05/2003  . Lumbar stenosis L2-3  . Muscle atrophy    in arms from degenerative changes in neck  . Neck pain    chronic  . Smoker   . Wears glasses    Past Surgical History:  Procedure Laterality Date  . CERVICAL DISC SURGERY  04/19/02   fusion, Dr. Lorin Mercy  . CERVICAL DISCECTOMY  1997   partial  . CERVICAL DISCECTOMY  2000  . COLONOSCOPY WITH ESOPHAGOGASTRODUODENOSCOPY (EGD)  06/25/2015  . [facility]  . KNEE ARTHROPLASTY  11/20/2011   Procedure: COMPUTER ASSISTED TOTAL KNEE ARTHROPLASTY;  Surgeon: Marybelle Killings, MD;  Location: [facility];  Service: Orthopedics;  Laterality: Left;  Conversion Left Knee Medial Uni to Total Knee Arthroplasty-Cemented  . LUMBAR LAMINECTOMY/DECOMPRESSION MICRODISCECTOMY N/A 04/28/2015   Procedure: L2-3 Decompression;  Surgeon: Marybelle Killings, MD;  Location: [place];  Service: Orthopedics;  Laterality: N/A;  . MR MRA DUPLICATE EXAM  INACTIVATE    . MULTIPLE TOOTH EXTRACTIONS    . Neisen funduplication  9381  . REPLACEMENT TOTAL KNEE  02/14/02, 05/04/02   left- partial  . SCC EXCISION  04/25/01   Dr. March Rummage  .  STOMACH SURGERY  1984   PUD, HH   Family History  Problem Relation Age of Onset  . Diabetes Mother   . Hypertension Mother   . Stroke Mother   . Diabetes Sister   . Diabetes Brother   . Diabetes Brother   . Heart disease Brother   . Diabetes Father   . Cancer Maternal Grandmother        breast  . Lung disease Paternal Grandfather        black lung  . Arthritis Neg Hx   . Prostate cancer Neg Hx   . Colon cancer Neg Hx    Social History   Socioeconomic History  . Marital status: Married    Spouse name: Not on file  . Number of  children: 3  . Years of education: Not on file  . Highest education level: Not on file  Occupational History  . Occupation: Training and development officer, retired    Comment: Scientific laboratory technician  Social Needs  . Financial resource strain: Not on file  . Food insecurity:    Worry: Not on file    Inability: Not on file  . Transportation needs:    Medical: Not on file    Non-medical: Not on file  Tobacco Use  . Smoking status: Former Smoker    Packs/day: 0.50    Years: 30.00    Pack years: 15.00    Types: Cigarettes    Last attempt to quit: 07/31/2011    Years since quitting: 6.3  . Smokeless tobacco: Never Used  Substance and Sexual Activity  . Alcohol use: No    Alcohol/week: 0.0 standard drinks  . Drug use: No  . Sexual activity: Never  Lifestyle  . Physical activity:    Days per week: Not on file    Minutes per session: Not on file  . Stress: Not on file  Relationships  . Social connections:    Talks on phone: Not on file    Gets together: Not on file    Attends religious service: Not on file    Active member of club or organization: Not on file    Attends meetings of clubs or organizations: Not on file    Relationship status: Not on file  Other Topics Concern  . 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 B-D INS SYR MICROFINE 1CC/28G 28G X 1/2" 1 ML MISC USE TO INJECT INSULIN FOUR TIMES A DAY  . celecoxib (CELEBREX) 200 MG capsule TAKE 1 CAPSULE DAILY  . cholecalciferol (VITAMIN D) 1000 UNITS tablet Take 5,000 Units by mouth 2 (two) times a week.  . Continuous Blood Gluc Sensor (FREESTYLE LIBRE 14 DAY SENSOR) MISC 1 Device by Does not apply route every 14 (fourteen) days.  . cyanocobalamin 1000 MCG tablet Take 1,000 mcg by mouth daily.  . cyclobenzaprine  (FLEXERIL) 10 MG tablet TAKE 1 TABLET TWICE A DAY AS NEEDED FOR MUSCLE SPASMS  . glucose blood (PRODIGY AUTOCODE TEST) test strip Use as instructed to test blood sugar 3-4 times per day  . insulin aspart (NOVOLOG FLEXPEN) 100 UNIT/ML FlexPen Inject 70 Units into the skin daily with supper. And pen needles 4/day  . Insulin Glargine (LANTUS SOLOSTAR) 100 UNIT/ML Solostar Pen Inject 110  Units into the skin every morning.  Marland Kitchen lisinopril (PRINIVIL,ZESTRIL) 20 MG tablet TAKE 1 TABLET DAILY  . LUMIGAN 0.01 % SOLN Place 1 drop into both eyes at bedtime.   . metFORMIN (GLUCOPHAGE) 1000 MG tablet Take 1 tablet (1,000 mg total) by mouth 2 (two) times daily with a meal.  . NIASPAN 500 MG CR tablet TAKE 1 TABLET AT BEDTIME  . Omega-3 Fatty Acids (FISH OIL PO) Take 1,000 mg by mouth once a week.  . pravastatin (PRAVACHOL) 80 MG tablet TAKE 1 TABLET DAILY  . traMADol (ULTRAM) 50 MG tablet Take 1 tablet (50 mg total) by mouth every 8 (eight) hours as needed (sedation caution). Chronic joint/back pain  . [DISCONTINUED] Insulin Pen Needle 32G X 4 MM MISC Used to give daily insulin injections 4x daily.   No facility-administered encounter medications on file as of 11/26/2017.     Activities of Daily Living In your present state of health, do you have any difficulty performing the following activities: 11/26/2017  Hearing? N  Vision? N  Difficulty concentrating or making decisions? N  Walking or climbing stairs? N  Dressing or bathing? N  Doing errands, shopping? N  Preparing Food and eating ? N  Using the Toilet? N  In the past six months, have you accidently leaked urine? N  Do you have problems with loss of bowel control? N  Managing your Medications? N  Managing your Finances? N  Housekeeping or managing your Housekeeping? N  Some recent data might be hidden    Patient Care Team: Tonia Ghent, MD as PCP - General (Family Medicine) Marybelle Killings, MD as Consulting Physician (Orthopedic  Surgery) Zehr, Laban Emperor, PA-C as Physician Assistant (Gastroenterology) Gardiner Barefoot, DPM as Consulting Physician (Podiatry) Thelma Comp, [place] as Referring Physician (Optometry) Renato Shin, MD as Consulting Physician (Endocrinology)   Assessment:   This is a routine wellness examination for [place].  Exercise Activities and Dietary recommendations Current Exercise Habits: The patient does not participate in regular exercise at present, Exercise limited by: orthopedic condition(s)(back, Left hip)  Goals    . Increase physical activity     Starting 11/26/2017, I will continue to walk at least 30 min 2 days per week as tolerated.        Fall Risk Fall Risk  11/26/2017 11/23/2016 08/06/2015 06/12/2013  Falls in the past year? No No Yes No  Comment - - fell while on riding lawn mower -  Number falls in past yr: - - 1 -  Injury with Fall? - - Yes -  Risk Factor Category  - - High Fall Risk -  Follow up - - Falls evaluation completed -   Depression Screen PHQ 2/9 Scores 11/26/2017 11/23/2016 08/06/2015 06/12/2013  PHQ - 2 Score 0 0 0 0  PHQ- 9 Score 0 0 - -    Cognitive Function MMSE - Mini Mental State Exam 11/26/2017 11/23/2016 08/06/2015  Orientation to time 5 5 5   Orientation to Place 5 5 5   Registration 3 3 3   Attention/ Calculation 0 0 0  Recall 3 2 3   Recall-comments - unable to recall 1 of 3 words -  Language- name 2 objects 0 0 0  Language- repeat 1 1 1   Language- follow 3 step command 3 3 3   Language- read & follow direction 0 0 0  Write a sentence 0 0 0  Copy design 0 0 0  Total score 20 19 20      PLEASE NOTE:  A Mini-Cog screen was completed. Maximum score is 20. A value of 0 denotes this part of Folstein MMSE was not completed or the patient failed this part of the Mini-Cog screening.   Mini-Cog Screening Orientation to Time - Max 5 pts Orientation to Place - Max 5 pts Registration - Max 3 pts Recall - Max 3 pts Language Repeat - Max 1 pts Language Follow  3 Step Command - Max 3 pts     Immunization History  Administered Date(s) Administered  . H1N1 01/30/2008  . Influenza Split 12/08/2010, 10/20/2011  . Influenza Whole 12/03/2008  . Influenza,inj,Quad PF,6+ Mos 12/06/2012, 12/15/2013, 11/17/2014, 11/16/2015, 11/23/2016, 11/26/2017  . Pneumococcal Conjugate-13 11/26/2017  . Pneumococcal Polysaccharide-23 06/12/2013  . Td 09/06/2001  . Tdap 12/07/2011    Screening Tests Health Maintenance  Topic Date Due  . HEMOGLOBIN A1C  03/27/2018  . OPHTHALMOLOGY EXAM  08/15/2018  . FOOT EXAM  09/25/2018  . PNA vac Low Risk Adult (2 of 2 - PPSV23) 11/27/2018  . COLONOSCOPY  06/24/2020  . TETANUS/TDAP  12/06/2021  . INFLUENZA VACCINE  Completed  . Hepatitis C Screening  Completed  . HIV Screening  Completed       Plan:     I have personally reviewed, addressed, and noted the following in the patient's chart:  A. Medical and social history B. Use of alcohol, tobacco or illicit drugs  C. Current medications and supplements D. Functional ability and status E.  Nutritional status F.  Physical activity G. Advance directives H. List of other physicians I.  Hospitalizations, surgeries, and ER visits in previous 12 months J.  [place] to include hearing, vision, cognitive, depression L. Referrals and appointments - none  In addition, I have reviewed and discussed with patient certain preventive protocols, quality metrics, and best practice recommendations. A written personalized care plan for preventive services as well as general preventive health recommendations were provided to patient.  See attached scanned questionnaire for additional information.   Signed,   Lindell Noe, MHA, BS, LPN Health Coach

## 2017-12-03 ENCOUNTER — Encounter: Payer: Self-pay | Admitting: Family Medicine

## 2017-12-03 ENCOUNTER — Other Ambulatory Visit: Payer: Self-pay | Admitting: *Deleted

## 2017-12-03 ENCOUNTER — Ambulatory Visit (INDEPENDENT_AMBULATORY_CARE_PROVIDER_SITE_OTHER): Payer: Medicare Other | Admitting: Family Medicine

## 2017-12-03 VITALS — BP 116/60 | HR 65 | Temp 98.0°F | Ht 69.0 in | Wt 219.5 lb

## 2017-12-03 DIAGNOSIS — R42 Dizziness and giddiness: Secondary | ICD-10-CM

## 2017-12-03 DIAGNOSIS — M2550 Pain in unspecified joint: Secondary | ICD-10-CM

## 2017-12-03 DIAGNOSIS — IMO0001 Reserved for inherently not codable concepts without codable children: Secondary | ICD-10-CM

## 2017-12-03 DIAGNOSIS — E785 Hyperlipidemia, unspecified: Secondary | ICD-10-CM

## 2017-12-03 DIAGNOSIS — Z794 Long term (current) use of insulin: Secondary | ICD-10-CM

## 2017-12-03 DIAGNOSIS — Z0000 Encounter for general adult medical examination without abnormal findings: Secondary | ICD-10-CM

## 2017-12-03 DIAGNOSIS — Z7189 Other specified counseling: Secondary | ICD-10-CM

## 2017-12-03 DIAGNOSIS — Z122 Encounter for screening for malignant neoplasm of respiratory organs: Secondary | ICD-10-CM

## 2017-12-03 DIAGNOSIS — E119 Type 2 diabetes mellitus without complications: Secondary | ICD-10-CM

## 2017-12-03 DIAGNOSIS — I10 Essential (primary) hypertension: Secondary | ICD-10-CM | POA: Diagnosis not present

## 2017-12-03 MED ORDER — GLUCOSE BLOOD VI STRP: ORAL_STRIP | 3 refills | Status: DC

## 2017-12-03 MED ORDER — LISINOPRIL 20 MG PO TABS: 20.0000 mg | ORAL_TABLET | Freq: Every day | ORAL | 3 refills | Status: DC

## 2017-12-03 MED ORDER — PRAVASTATIN SODIUM 80 MG PO TABS: 80.0000 mg | ORAL_TABLET | Freq: Every day | ORAL | 3 refills | Status: DC

## 2017-12-03 MED ORDER — INSULIN PEN NEEDLE 32G X 4 MM MISC: 11 refills | Status: DC

## 2017-12-03 NOTE — Progress Notes (Signed)
Elevated Cholesterol: Using medications without problems:yes Muscle aches: no Diet compliance: yes Exercise: yes, mainly yardwork and walking.     Lipids improved from prev check.    LFTs have normalized.  D/w pt.    Hypertension:    Using medication without problems or lightheadedness: yes Chest pain with exertion:no Edema:no Short of breath:no  He has episodes of possible vertigo/unsteadiness with concurrently looking to the left and down. He doesn't have sx with standing w/o rotation. No sx looking ahead.  He doesn't feel like he is presyncopal.    DM2 per endo.  I'll defer.  He agrees.  Sugar has been around 100-200 on home checks. Cautions re: hypoglycemia d/w pt.    Joint pain.  Taking celebrex and then tramadol as needed.  Sig IP and hip joint pain.  Relief of pain with med.  No ADE on med. Labs d/w pt.  Cr wnl.    Former smoker.  Prev d/w pt about lung cancer screening program.  He opts in.   Living will d/w pt. Wife designated if patient were incapacitated.  Prostate cancer screening and PSA options (with potential risks and benefits of testing vs not testing) were discussed along with recent recs/guidelines.  He declined testing PSA at this point. Colonoscopy 2017 shingrix out of stock.  D/w pt.   PMH and SH reviewed  Meds, vitals, and allergies reviewed.   ROS: Per HPI unless specifically indicated in ROS section   GEN: nad, alert and oriented HEENT: mucous membranes moist NECK: supple w/o LA CV: rrr. PULM: ctab, no inc wob ABD: soft, +bs EXT: no edema SKIN: no acute rash He has vertigo/unsteadiness with concurrently looking to the left and down. He doesn't have sx with standing w/o rotation. No sx looking ahead.  No focal neurologic changes otherwise, no cranial nerve deficit or acute strength or sensation changes in the extremities  Diabetic foot exam: Normal inspection No skin breakdown No calluses  Normal DP pulses Normal sensation to light touch and  monofilament Nails thickened, R1st nail partially removed.

## 2017-12-03 NOTE — Patient Instructions (Addendum)
We will call about your referral.  Anthony Cuevas or Anthony Cuevas will call you if you don't see one of them on the way out.  Use the bedside exercise for vertigo (leaning and looking up) to see if that helps with your symptoms.  Take care.  Glad to see you.

## 2017-12-05 ENCOUNTER — Telehealth: Payer: Self-pay | Admitting: *Deleted

## 2017-12-05 ENCOUNTER — Encounter: Payer: Self-pay | Admitting: *Deleted

## 2017-12-05 DIAGNOSIS — Z122 Encounter for screening for malignant neoplasm of respiratory organs: Secondary | ICD-10-CM

## 2017-12-05 NOTE — Telephone Encounter (Signed)
Received a referral for initial lung cancer screening scan.  Contacted the patient and obtained their smoking history, former smoker quit 2014 with 41 pkyr history  as well as answering questions related to screening process.  Patient denies signs of lung cancer such as weight loss or hemoptysis at this time.  Patient denies comorbidity that would prevent curative treatment if lung cancer were found.  Patient is scheduled for the Shared Decision Making Visit and CT scan on 01-01-18@1345 .

## 2017-12-06 DIAGNOSIS — R42 Dizziness and giddiness: Secondary | ICD-10-CM | POA: Insufficient documentation

## 2017-12-06 DIAGNOSIS — M2550 Pain in unspecified joint: Secondary | ICD-10-CM | POA: Insufficient documentation

## 2017-12-06 NOTE — Assessment & Plan Note (Addendum)
He clearly has reproducible symptoms with looking down to the left concurrently.  He does not have symptoms with head rotation otherwise or with eye tracking.  He does not have orthostatic symptoms.  Reasonable to try home bedside vertigo exercises.  See after visit summary.  Update me as needed.  He agrees. >25 minutes spent in face to face time with patient, >50% spent in counselling or coordination of care.

## 2017-12-06 NOTE — Assessment & Plan Note (Signed)
Reasonable to continue Celebrex and tramadol as needed.  Creatinine still normal.  No adverse effect on medication.

## 2017-12-06 NOTE — Assessment & Plan Note (Signed)
Per endocrine.  I will defer.  Cautions regarding hypoglycemia discussed with patient.

## 2017-12-06 NOTE — Assessment & Plan Note (Signed)
Discussed diet and exercise.  LFTs were previously normalized.  Continue work on diet.  Continue statin for now.  He agrees.

## 2017-12-06 NOTE — Assessment & Plan Note (Signed)
Reasonable control, I do not want to induce hypotension.  No orthostatic symptoms on standing.  Continue as is.  He agrees.

## 2017-12-06 NOTE — Assessment & Plan Note (Signed)
Living will d/w pt.  Wife designated if patient were incapacitated.   ?

## 2017-12-06 NOTE — Assessment & Plan Note (Signed)
Former smoker.  Prev d/w pt about lung cancer screening program.  He opts in.   Living will d/w pt. Wife designated if patient were incapacitated.  Prostate cancer screening and PSA options (with potential risks and benefits of testing vs not testing) were discussed along with recent recs/guidelines.  He declined testing PSA at this point. Colonoscopy 2017 shingrix out of stock.  D/w pt.

## 2017-12-11 DIAGNOSIS — H401131 Primary open-angle glaucoma, bilateral, mild stage: Secondary | ICD-10-CM | POA: Diagnosis not present

## 2017-12-15 ENCOUNTER — Other Ambulatory Visit: Payer: Self-pay | Admitting: Family Medicine

## 2017-12-21 ENCOUNTER — Encounter: Payer: Self-pay | Admitting: Podiatry

## 2017-12-21 ENCOUNTER — Ambulatory Visit (INDEPENDENT_AMBULATORY_CARE_PROVIDER_SITE_OTHER): Payer: Medicare Other | Admitting: Podiatry

## 2017-12-21 DIAGNOSIS — M2010 Hallux valgus (acquired), unspecified foot: Secondary | ICD-10-CM

## 2017-12-21 DIAGNOSIS — B351 Tinea unguium: Secondary | ICD-10-CM | POA: Diagnosis not present

## 2017-12-21 DIAGNOSIS — M79676 Pain in unspecified toe(s): Secondary | ICD-10-CM | POA: Diagnosis not present

## 2017-12-21 DIAGNOSIS — E119 Type 2 diabetes mellitus without complications: Secondary | ICD-10-CM

## 2017-12-21 NOTE — Progress Notes (Signed)
Patient ID: Anthony Cuevas, male   DOB: 1950/08/14, 67 y.o.   MRN: [id number] Complaint:  Visit Type: Patient returns to my office for continued preventative foot care services. Complaint: Patient states" my nails have grown long and thick and become painful to walk and wear shoes" Patient has been diagnosed with DM with no foot complications. The patient presents for preventative foot care services. No changes to ROS.    Podiatric Exam: Vascular: dorsalis pedis and posterior tibial pulses are palpable bilateral. Capillary return is immediate. Temperature gradient is WNL. Skin turgor WNL  Sensorium: Diminished  Semmes Weinstein monofilament test. Normal tactile sensation bilaterally. Nail Exam: Pt has thick disfigured discolored nails with subungual debris noted bilateral entire nail hallux through fifth toenails Ulcer Exam: There is no evidence of ulcer or pre-ulcerative changes or infection. Orthopedic Exam: Muscle tone and strength are WNL. No limitations in general ROM. No crepitus or effusions noted. Foot type and digits show no abnormalities. Bony prominences are unremarkable.HAV B/L Skin: No Porokeratosis. 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 The patient understood the discussion of treatment and procedures well. All questions were answered thoroughly reviewed. Debridement of mycotic and hypertrophic toenails, 1 through 5 bilateral and clearing of subungual debris. No ulceration, no infection noted.   ABN signed for 2019. Return Visit-Office Procedure: Patient instructed to return to the office for a follow up visit 3 months for continued evaluation and treatment.    Gardiner Barefoot DPM

## 2017-12-25 ENCOUNTER — Encounter: Payer: Self-pay | Admitting: Endocrinology

## 2017-12-25 ENCOUNTER — Ambulatory Visit (INDEPENDENT_AMBULATORY_CARE_PROVIDER_SITE_OTHER): Payer: Medicare Other | Admitting: Endocrinology

## 2017-12-25 VITALS — BP 140/62 | HR 112 | Ht 69.0 in | Wt 219.2 lb

## 2017-12-25 DIAGNOSIS — E11649 Type 2 diabetes mellitus with hypoglycemia without coma: Secondary | ICD-10-CM

## 2017-12-25 LAB — POCT GLYCOSYLATED HEMOGLOBIN (HGB A1C): Hemoglobin A1C: 6.9 % — AB (ref 4.0–5.6)

## 2017-12-25 MED ORDER — INSULIN ASPART 100 UNIT/ML FLEXPEN: 60.0000 [IU] | PEN_INJECTOR | Freq: Every day | SUBCUTANEOUS | 11 refills | Status: DC

## 2017-12-25 NOTE — Progress Notes (Signed)
Subjective:    Patient ID: Anthony Cuevas, male    DOB: Jan 08, 1951, 67 y.o.   MRN: [id number]  HPI Pt returns for f/u of diabetes mellitus: DM type: Insulin-requiring type 2 Dx'ed: 0347 Complications: none Therapy: insulin since 2006, and metformin   DKA: never Severe hypoglycemia: never Pancreatitis: never Pancreatic imaging: never Other: he stopped pioglitizone, due to edema; he takes 2 QD insulins, after poor results with multiple daily injections.   Interval history: continuous glucose monitor data are reviewed today, and the printout is scanned into the record.  It varies from 50-300.  It is in general higher as the day goes on until 7 PM, but it is then lowest at HS.  He takes novolog after supper.   Past Medical History:  Diagnosis Date  . Arthritis   . Depression    improved on sertraline  . Diabetes mellitus, type 2 ([place]) 09/1996  . GERD (gastroesophageal reflux disease) 1990  . History of peptic ulcer disease   . Hyperlipidemia 1992  . Hypertension 05/2003  . Lumbar stenosis L2-3  . Muscle atrophy    in arms from degenerative changes in neck  . Neck pain    chronic  . Smoker   . Wears glasses     Past Surgical History:  Procedure Laterality Date  . CERVICAL DISC SURGERY  04/19/02   fusion, Dr. Lorin Mercy  . CERVICAL DISCECTOMY  1997   partial  . CERVICAL DISCECTOMY  2000  . COLONOSCOPY WITH ESOPHAGOGASTRODUODENOSCOPY (EGD)  06/25/2015  . [place]  . KNEE ARTHROPLASTY  11/20/2011   Procedure: COMPUTER ASSISTED TOTAL KNEE ARTHROPLASTY;  Surgeon: Marybelle Killings, MD;  Location: [facility];  Service: Orthopedics;  Laterality: Left;  Conversion Left Knee Medial Uni to Total Knee Arthroplasty-Cemented  . LUMBAR LAMINECTOMY/DECOMPRESSION MICRODISCECTOMY N/A 04/28/2015   Procedure: L2-3 Decompression;  Surgeon: Marybelle Killings, MD;  Location: [facility];  Service: Orthopedics;  Laterality: N/A;  . MR MRA DUPLICATE EXAM  INACTIVATE    . MULTIPLE TOOTH EXTRACTIONS    .  Neisen funduplication  4259  . REPLACEMENT TOTAL KNEE  02/14/02, 05/04/02   left- partial  . SCC EXCISION  04/25/01   Dr. March Rummage  . STOMACH SURGERY  1984   PUD, [place]    Social History   Socioeconomic History  . Marital status: Married    Spouse name: Not on file  . Number of children: 3  . Years of education: Not on file  . Highest education level: Not on file  Occupational History  . Occupation: Training and development officer, retired    Comment: Scientific laboratory technician  Social Needs  . Financial resource strain: Not on file  . Food insecurity:    Worry: Not on file    Inability: Not on file  . Transportation needs:    Medical: Not on file    Non-medical: Not on file  Tobacco Use  . Smoking status: Former Smoker    Packs/day: 1.00    Years: 41.00    Pack years: 41.00    Types: Cigarettes    Last attempt to quit: 2014    Years since quitting: 5.9  . Smokeless tobacco: Never Used  Substance and Sexual Activity  . Alcohol use: No    Alcohol/week: 0.0 standard drinks  . Drug use: No  . Sexual activity: Never  Lifestyle  . Physical activity:    Days per week: Not on file    Minutes per session: Not on file  .  Stress: Not on file  Relationships  . Social connections:    Talks on phone: Not on file    Gets together: Not on file    Attends religious service: Not on file    Active member of club or organization: Not on file    Attends meetings of clubs or organizations: Not on file    Relationship status: Not on file  . Intimate partner violence:    Fear of current or ex partner: Not on file    Emotionally abused: Not on file    Physically abused: Not on file    Forced sexual activity: Not on file  Other Topics Concern  . 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 B-D INS SYR MICROFINE 1CC/28G 28G X 1/2" 1 ML MISC USE TO INJECT INSULIN FOUR TIMES A DAY 400 each 2  . celecoxib (CELEBREX) 200 MG capsule TAKE 1 CAPSULE DAILY 90 capsule 4  . cholecalciferol (VITAMIN D) 1000 UNITS tablet Take 5,000 Units by mouth 2 (two) times a week.    . Continuous Blood Gluc Sensor (FREESTYLE LIBRE 14 DAY SENSOR) MISC 1 Device by Does not apply route every 14 (fourteen) days. 6 each 3  . cyanocobalamin 1000 MCG tablet Take 1,000 mcg by mouth daily.    . cyclobenzaprine (FLEXERIL) 10 MG tablet TAKE 1 TABLET TWICE A DAY AS NEEDED FOR MUSCLE SPASMS 180 tablet 3  . glucose blood (ONE TOUCH ULTRA TEST) test strip Use as instructed to test blood sugar 3 or 4 times daily.  Diagnosis: E11.649   Insulin dependent. 100 each 3  . glucose blood (PRODIGY AUTOCODE TEST) test strip Use as instructed to test blood sugar 3-4 times per day    . Insulin Glargine (LANTUS SOLOSTAR) 100 UNIT/ML Solostar Pen Inject 110 Units into the skin every morning. 45 pen PRN  . Insulin Pen Needle 32G X 4 MM MISC Use as directed to inject insulin 4 times daily.  Diagnosis: E11.649  Insulin dependent. 100 each 11  . lisinopril (PRINIVIL,ZESTRIL) 20 MG tablet Take 1 tablet (20 mg total) by mouth daily. 90 tablet 3  . LUMIGAN 0.01 % SOLN Place 1 drop into both eyes at bedtime.     . metFORMIN (GLUCOPHAGE) 1000 MG tablet Take 1 tablet (1,000 mg total) by mouth 2 (two) times daily with a meal. 180 tablet 3  . NIASPAN 500 MG CR tablet TAKE 1 TABLET AT BEDTIME 90 tablet 4  . Omega-3 Fatty Acids (FISH OIL PO) Take 1,000 mg by mouth once a week.    . pravastatin (PRAVACHOL) 80 MG tablet Take 1 tablet (80 mg total) by mouth daily. 90 tablet 3  . traMADol (ULTRAM) 50 MG tablet Take 1 tablet (50 mg total) by mouth every 8 (eight) hours as needed (sedation caution). Chronic joint/back pain 90 tablet 2   No current facility-administered medications on file prior to visit.     No Known  Allergies  Family History  Problem Relation Age of Onset  . Diabetes Mother   . Hypertension Mother   . Stroke Mother   . Diabetes Sister   . Diabetes Brother   . Diabetes Brother   . Heart disease Brother   .  Diabetes Father   . Cancer Maternal Grandmother        breast  . Lung disease Paternal Grandfather        black lung  . Arthritis Neg Hx   . Prostate cancer Neg Hx   . Colon cancer Neg Hx     BP 140/62 (BP Location: Left Arm, Patient Position: Sitting, Cuff Size: Normal)   Pulse (!) 112   Ht 5\' 9"  (1.753 m)   Wt 219 lb 3.2 oz (99.4 kg)   SpO2 95%   BMI 32.37 kg/m    Review of Systems Denies LOC     Objective:   Physical Exam VITAL SIGNS:  See vs page.   GENERAL: no distress.  Pulses: foot pulses are intact bilaterally.   MSK: no deformity of the feet or ankles.  CV: trace bilat edema of the legs.   Skin:  no ulcer on the feet or ankles.  normal temp on the feet and ankles.  Neuro: sensation is intact to touch on the feet and ankles.   Ext: There is severe bilateral onychomycosis of the toenails.    Lab Results  Component Value Date   CREATININE 0.98 11/26/2017   BUN 17 11/26/2017   NA 138 11/26/2017   K 4.3 11/26/2017   CL 101 11/26/2017   CO2 28 11/26/2017   Lab Results  Component Value Date   HGBA1C 6.9 (A) 12/25/2017       Assessment & Plan:  HTN: is noted today Insulin-requiring type 2 DM: overcontrolled, given this regimen, which does match insulin to her changing needs throughout the day. Hypoglycemia: prob due to taking novolog after supper   Patient Instructions  Your blood pressure is high today.  Please see your primary care provider soon, to have it rechecked check your blood sugar 4 times a day.  vary the time of day when you check, between before the 3 meals, and at bedtime.  also check if you have symptoms of your blood sugar being too high or too low.  please keep a record of the readings and bring it to your next appointment  here (or you can bring the meter itself).  You can write it on any piece of paper.  please call us sooner if your blood sugar goes below 70, or if you have a lot of readings over 200.   Please reduce the novolog to 60 units, and take it before you eat supper. Please continue the same lantus.  Please come back for a follow-up appointment in 3 months.

## 2017-12-25 NOTE — Patient Instructions (Addendum)
Your blood pressure is high today.  Please see your primary care provider soon, to have it rechecked check your blood sugar 4 times a day.  vary the time of day when you check, between before the 3 meals, and at bedtime.  also check if you have symptoms of your blood sugar being too high or too low.  please keep a record of the readings and bring it to your next appointment here (or you can bring the meter itself).  You can write it on any piece of paper.  please call us sooner if your blood sugar goes below 70, or if you have a lot of readings over 200.   Please reduce the novolog to 60 units, and take it before you eat supper. Please continue the same lantus.  Please come back for a follow-up appointment in 3 months.

## 2018-01-01 ENCOUNTER — Inpatient Hospital Stay: Payer: Medicare Other | Attending: Oncology | Admitting: Oncology

## 2018-01-01 ENCOUNTER — Ambulatory Visit
Admission: RE | Admit: 2018-01-01 | Discharge: 2018-01-01 | Disposition: A | Discharge status: Home / Self Care | Payer: Medicare Other | Source: Ambulatory Visit | Attending: Oncology | Admitting: Oncology

## 2018-01-01 DIAGNOSIS — Z87891 Personal history of nicotine dependence: Secondary | ICD-10-CM | POA: Diagnosis not present

## 2018-01-01 DIAGNOSIS — Z122 Encounter for screening for malignant neoplasm of respiratory organs: Secondary | ICD-10-CM | POA: Insufficient documentation

## 2018-01-01 NOTE — Progress Notes (Signed)
In accordance with CMS guidelines, patient has met eligibility criteria including age, absence of signs or symptoms of lung cancer.  Social History   Tobacco Use  . Smoking status: Former Smoker    Packs/day: 1.00    Years: 41.00    Pack years: 41.00    Types: Cigarettes    Last attempt to quit: 2014    Years since quitting: 5.9  . Smokeless tobacco: Never Used  Substance Use Topics  . Alcohol use: No    Alcohol/week: 0.0 standard drinks  . Drug use: No     A shared decision-making session was conducted prior to the performance of CT scan. This includes one or more decision aids, includes benefits and harms of screening, follow-up diagnostic testing, over-diagnosis, false positive rate, and total radiation exposure.  Counseling on the importance of adherence to annual lung cancer LDCT screening, impact of co-morbidities, and ability or willingness to undergo diagnosis and treatment is imperative for compliance of the program.  Counseling on the importance of continued smoking cessation for former smokers; the importance of smoking cessation for current smokers, and information about tobacco cessation interventions have been given to patient including [place] and [facility] [place] programs.  Written order for lung cancer screening with LDCT has been given to the patient and any and all questions have been answered to the best of my abilities.   Yearly follow up will be coordinated by Burgess Estelle, Thoracic Navigator.  Faythe Casa, NP 01/01/2018 2:22 PM

## 2018-01-04 ENCOUNTER — Telehealth: Payer: Self-pay | Admitting: *Deleted

## 2018-01-04 DIAGNOSIS — I2510 Atherosclerotic heart disease of native coronary artery without angina pectoris: Secondary | ICD-10-CM

## 2018-01-04 DIAGNOSIS — K760 Fatty (change of) liver, not elsewhere classified: Secondary | ICD-10-CM

## 2018-01-04 NOTE — Telephone Encounter (Signed)
Notified patient of LDCT lung cancer screening program results with recommendation for 6 month follow up imaging. Also notified of incidental findings noted below and is encouraged to discuss further with PCP who will receive a copy of this note and/or the CT report. Patient verbalizes understanding.   IMPRESSION: 1. Subpleural nodular lesion in the superior segment left lower lobe is likely due to scarring. However, it is new from prior exam and malignancy cannot be excluded. Lung-RADS 3, probably benign findings. Short-term follow-up in 6 months is recommended with repeat low-dose chest CT without contrast (please use the following order, "CT CHEST LCS NODULE FOLLOW-UP W/O CM"). 2. Suspect cirrhosis. 3. Aortic atherosclerosis (ICD10-170.0). Three-vessel coronary artery calcification. 4.  Emphysema (ICD10-J43.9).

## 2018-01-04 NOTE — Telephone Encounter (Signed)
Please notify pt that I want to look back at all of his data (see below) and we'll be in touch.   Thanks.

## 2018-01-04 NOTE — Telephone Encounter (Signed)
Patient advised.

## 2018-01-06 DIAGNOSIS — I2510 Atherosclerotic heart disease of native coronary artery without angina pectoris: Secondary | ICD-10-CM | POA: Insufficient documentation

## 2018-01-06 NOTE — Addendum Note (Signed)
Addended by: Tonia Ghent on: 01/06/2018 08:20 PM   Modules accepted: Orders

## 2018-01-06 NOTE — Telephone Encounter (Signed)
Call patient.  I looked back at his imaging.  He has coronary artery calcification, meaning he has coronary artery disease.  That does not mean he had a heart attack but I do want him to see cardiology in the meantime.  I would continue taking his current medications for now.  If he is having chest pain then let us know or go to the emergency room.  He could potentially have cirrhosis of the liver.  We know from previous imaging that he had fatty deposits in his liver.  I would like for him to see the GI clinic.  I put in a referral for that too.  Thanks.

## 2018-01-07 NOTE — Telephone Encounter (Signed)
Patient advised.

## 2018-02-12 ENCOUNTER — Encounter: Payer: Self-pay | Admitting: *Deleted

## 2018-02-12 ENCOUNTER — Other Ambulatory Visit: Payer: Self-pay | Admitting: *Deleted

## 2018-02-12 NOTE — Telephone Encounter (Signed)
Faxed refill request. Tramadol Last office visit:   12/03/2017 Last Filled:     90 tablet 2 11/06/2017  Please advise.

## 2018-02-13 MED ORDER — TRAMADOL HCL 50 MG PO TABS: 50.0000 mg | ORAL_TABLET | Freq: Three times a day (TID) | ORAL | 2 refills | Status: DC | PRN

## 2018-02-13 NOTE — Telephone Encounter (Signed)
Sent. Thanks.   

## 2018-02-20 ENCOUNTER — Other Ambulatory Visit: Payer: Self-pay | Admitting: Family Medicine

## 2018-02-20 NOTE — Telephone Encounter (Signed)
Electronic refill request Cyclobenzaprine Last refill 05/29/17 #180/3 Last office visit 12/03/17

## 2018-02-20 NOTE — Telephone Encounter (Signed)
Sent. Thanks.   

## 2018-03-05 ENCOUNTER — Other Ambulatory Visit: Payer: Self-pay | Admitting: *Deleted

## 2018-03-20 ENCOUNTER — Ambulatory Visit (INDEPENDENT_AMBULATORY_CARE_PROVIDER_SITE_OTHER): Payer: Medicare Other | Admitting: Podiatry

## 2018-03-20 ENCOUNTER — Other Ambulatory Visit: Payer: Self-pay

## 2018-03-20 ENCOUNTER — Encounter: Payer: Self-pay | Admitting: Podiatry

## 2018-03-20 DIAGNOSIS — M79676 Pain in unspecified toe(s): Secondary | ICD-10-CM

## 2018-03-20 DIAGNOSIS — B351 Tinea unguium: Secondary | ICD-10-CM | POA: Diagnosis not present

## 2018-03-20 DIAGNOSIS — S91209A Unspecified open wound of unspecified toe(s) with damage to nail, initial encounter: Secondary | ICD-10-CM

## 2018-03-20 DIAGNOSIS — E119 Type 2 diabetes mellitus without complications: Secondary | ICD-10-CM

## 2018-03-20 NOTE — Progress Notes (Signed)
Patient ID: REAL CONA, male   DOB: 1950/12/10, 68 y.o.   MRN: [id number] Complaint:  Visit Type: Patient returns to my office for continued preventative foot care services. Complaint: Patient states" my nails have grown long and thick and become painful to walk and wear shoes" Patient has been diagnosed with DM with no foot complications. The patient presents for preventative foot care services. No changes to ROS.  He says he caught his big toe right foot on the leg of a chest.  He injured his nail three days ago.  He has been soaking his toe since the injury.     Podiatric Exam: Vascular: dorsalis pedis and posterior tibial pulses are palpable bilateral. Capillary return is immediate. Temperature gradient is WNL. Skin turgor WNL  Sensorium: Diminished  Semmes Weinstein monofilament test. Normal tactile sensation bilaterally. Nail Exam: Pt has thick disfigured discolored nails with subungual debris noted bilateral entire nail hallux through fifth toenails.  The hallux toenail is unattached right nail bed except at proximal nail fold. No redness or swelling noted. Ulcer Exam: There is no evidence of ulcer or pre-ulcerative changes or infection. Orthopedic Exam: Muscle tone and strength are WNL. No limitations in general ROM. No crepitus or effusions noted. Foot type and digits show no abnormalities. Bony prominences are unremarkable.HAV B/L Skin: No Porokeratosis. No infection or ulcers  Diagnosis:  Onychomycosis, , Pain in right toe, pain in left toes,  Diabetes with neuro[pathy  HAV  B/L  Nail avulsion right hallux nail.  Treatment & Plan Procedures and Treatment: Consent by patient was obtained for treatment procedures. The patient understood the discussion of treatment and procedures well. All questions were answered thoroughly reviewed. Debridement of mycotic and hypertrophic toenails, 1 through 5 bilateral and clearing of subungual debris. No ulceration, no infection noted.  Removal of  nail plate right hallux.  Neos[porin /DSD.  Call the office if pain occurs right hallux. Return Visit-Office Procedure: Patient instructed to return to the office for a follow up visit 3 months for continued evaluation and treatment.    Gardiner Barefoot DPM

## 2018-03-27 ENCOUNTER — Encounter: Payer: Self-pay | Admitting: Endocrinology

## 2018-03-27 ENCOUNTER — Other Ambulatory Visit: Payer: Self-pay

## 2018-03-27 ENCOUNTER — Ambulatory Visit (INDEPENDENT_AMBULATORY_CARE_PROVIDER_SITE_OTHER): Payer: Medicare Other | Admitting: Endocrinology

## 2018-03-27 VITALS — BP 140/70 | HR 119 | Ht 69.0 in | Wt 216.4 lb

## 2018-03-27 DIAGNOSIS — E11649 Type 2 diabetes mellitus with hypoglycemia without coma: Secondary | ICD-10-CM | POA: Diagnosis not present

## 2018-03-27 LAB — POCT GLYCOSYLATED HEMOGLOBIN (HGB A1C): Hemoglobin A1C: 8.7 % — AB (ref 4.0–5.6)

## 2018-03-27 MED ORDER — INSULIN GLARGINE 100 UNIT/ML SOLOSTAR PEN: 100.0000 [IU] | PEN_INJECTOR | SUBCUTANEOUS | 99 refills | Status: DC

## 2018-03-27 MED ORDER — INSULIN ASPART 100 UNIT/ML FLEXPEN: 80.0000 [IU] | PEN_INJECTOR | Freq: Every day | SUBCUTANEOUS | 11 refills | Status: DC

## 2018-03-27 NOTE — Progress Notes (Signed)
Subjective:    Patient ID: Anthony Cuevas, male    DOB: 03-05-1950, 68 y.o.   MRN: [id number]  HPI Pt returns for f/u of diabetes mellitus: DM type: Insulin-requiring type 2 Dx'ed: 1638 Complications: none Therapy: insulin since 2006, and metformin   DKA: never Severe hypoglycemia: never Pancreatitis: never Pancreatic imaging: never Other: he stopped pioglitizone, due to edema; he takes 2 QD insulins, after poor results with multiple daily injections.   Interval history: continuous glucose monitor data are reviewed today, and the printout is scanned into the record.  It varies from 50-400.  It is in general higher as the day goes on until 9 PM.  it then decreases until 4 AM.  He takes novolog after supper.   Past Medical History:  Diagnosis Date  . Arthritis   . Depression    improved on sertraline  . Diabetes mellitus, type 2 ([place]) 09/1996  . GERD (gastroesophageal reflux disease) 1990  . History of peptic ulcer disease   . Hyperlipidemia 1992  . Hypertension 05/2003  . Lumbar stenosis L2-3  . Muscle atrophy    in arms from degenerative changes in neck  . Neck pain    chronic  . Smoker   . Wears glasses     Past Surgical History:  Procedure Laterality Date  . CERVICAL DISC SURGERY  04/19/02   fusion, Dr. Lorin Mercy  . CERVICAL DISCECTOMY  1997   partial  . CERVICAL DISCECTOMY  2000  . COLONOSCOPY WITH ESOPHAGOGASTRODUODENOSCOPY (EGD)  06/25/2015  . [place]  . KNEE ARTHROPLASTY  11/20/2011   Procedure: COMPUTER ASSISTED TOTAL KNEE ARTHROPLASTY;  Surgeon: Marybelle Killings, MD;  Location: [facility];  Service: Orthopedics;  Laterality: Left;  Conversion Left Knee Medial Uni to Total Knee Arthroplasty-Cemented  . LUMBAR LAMINECTOMY/DECOMPRESSION MICRODISCECTOMY N/A 04/28/2015   Procedure: L2-3 Decompression;  Surgeon: Marybelle Killings, MD;  Location: [facility];  Service: Orthopedics;  Laterality: N/A;  . MR MRA DUPLICATE EXAM  INACTIVATE    . MULTIPLE TOOTH EXTRACTIONS    .  Neisen funduplication  4536  . REPLACEMENT TOTAL KNEE  02/14/02, 05/04/02   left- partial  . SCC EXCISION  04/25/01   Dr. March Rummage  . STOMACH SURGERY  1984   PUD, [place]    Social History   Socioeconomic History  . Marital status: Married    Spouse name: Not on file  . Number of children: 3  . Years of education: Not on file  . Highest education level: Not on file  Occupational History  . Occupation: Training and development officer, retired    Comment: Scientific laboratory technician  Social Needs  . Financial resource strain: Not on file  . Food insecurity:    Worry: Not on file    Inability: Not on file  . Transportation needs:    Medical: Not on file    Non-medical: Not on file  Tobacco Use  . Smoking status: Former Smoker    Packs/day: 1.00    Years: 41.00    Pack years: 41.00    Types: Cigarettes    Last attempt to quit: 2014    Years since quitting: 6.1  . Smokeless tobacco: Never Used  Substance and Sexual Activity  . Alcohol use: No    Alcohol/week: 0.0 standard drinks  . Drug use: No  . Sexual activity: Never  Lifestyle  . Physical activity:    Days per week: Not on file    Minutes per session: Not on file  .  Stress: Not on file  Relationships  . Social connections:    Talks on phone: Not on file    Gets together: Not on file    Attends religious service: Not on file    Active member of club or organization: Not on file    Attends meetings of clubs or organizations: Not on file    Relationship status: Not on file  . Intimate partner violence:    Fear of current or ex partner: Not on file    Emotionally abused: Not on file    Physically abused: Not on file    Forced sexual activity: Not on file  Other Topics Concern  . 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 B-D INS SYR MICROFINE 1CC/28G 28G X 1/2" 1 ML MISC USE TO INJECT INSULIN FOUR TIMES A DAY 400 each 2  . celecoxib (CELEBREX) 200 MG capsule TAKE 1 CAPSULE DAILY 90 capsule 4  . cholecalciferol (VITAMIN D) 1000 UNITS tablet Take 5,000 Units by mouth 2 (two) times a week.    . Continuous Blood Gluc Sensor (FREESTYLE LIBRE 14 DAY SENSOR) MISC 1 Device by Does not apply route every 14 (fourteen) days. 6 each 3  . cyanocobalamin 1000 MCG tablet Take 1,000 mcg by mouth daily.    . cyclobenzaprine (FLEXERIL) 10 MG tablet TAKE 1 TABLET TWICE A DAY AS NEEDED FOR MUSCLE SPASMS 180 tablet 3  . glucose blood (ONE TOUCH ULTRA TEST) test strip Use as instructed to test blood sugar 3 or 4 times daily.  Diagnosis: E11.649   Insulin dependent. 100 each 3  . glucose blood (PRODIGY AUTOCODE TEST) test strip Use as instructed to test blood sugar 3-4 times per day    . Insulin Pen Needle 32G X 4 MM MISC Use as directed to inject insulin 4 times daily.  Diagnosis: E11.649  Insulin dependent. 100 each 11  . lisinopril (PRINIVIL,ZESTRIL) 20 MG tablet Take 1 tablet (20 mg total) by mouth daily. 90 tablet 3  . LUMIGAN 0.01 % SOLN Place 1 drop into both eyes at bedtime.     . metFORMIN (GLUCOPHAGE) 1000 MG tablet Take 1 tablet (1,000 mg total) by mouth 2 (two) times daily with a meal. 180 tablet 3  . NIASPAN 500 MG CR tablet TAKE 1 TABLET AT BEDTIME 90 tablet 4  . Omega-3 Fatty Acids (FISH OIL PO) Take 1,000 mg by mouth once a week.    . pravastatin (PRAVACHOL) 80 MG tablet Take 1 tablet (80 mg total) by mouth daily. 90 tablet 3  . traMADol (ULTRAM) 50 MG tablet Take 1 tablet (50 mg total) by mouth every 8 (eight) hours as needed (sedation caution). Chronic joint/back pain 90 tablet 2   No current facility-administered medications on file prior to visit.     No Known Allergies  Family History  Problem Relation Age of Onset  . Diabetes Mother   . Hypertension Mother   . Stroke Mother   . Diabetes  Sister   . Diabetes Brother   . Diabetes Brother   . Heart disease Brother   . Diabetes Father   . Cancer Maternal Grandmother        breast  . Lung disease  Paternal Grandfather        black lung  . Arthritis Neg Hx   . Prostate cancer Neg Hx   . Colon cancer Neg Hx     BP 140/70 (BP Location: Right Arm, Patient Position: Sitting, Cuff Size: Large)   Pulse (!) 119   Ht 5\' 9"  (1.753 m)   Wt 216 lb 6.4 oz (98.2 kg)   SpO2 95%   BMI 31.96 kg/m    Review of Systems Denies LOC    Objective:   Physical Exam VITAL SIGNS:  See vs page.   GENERAL: no distress.  Pulses: foot pulses are intact bilaterally.   MSK: no deformity of the feet or ankles.  CV: trace bilat edema of the legs.   Skin:  no ulcer on the feet or ankles.  normal temp on the feet and ankles.  Neuro: sensation is intact to touch on the feet and ankles.   Ext: There is severe bilateral onychomycosis of the toenails.   A1c=8.7%.    Lab Results  Component Value Date   CREATININE 0.98 11/26/2017   BUN 17 11/26/2017   NA 138 11/26/2017   K 4.3 11/26/2017   CL 101 11/26/2017   CO2 28 11/26/2017       Assessment & Plan:  Insulin-requiring type 2 DM: worse.   Hypoglycemia: The pattern of his cbg's indicates he needs some adjustment in his therapy.  We discussed.  he declines to change to NPH only, at least for now.   Patient Instructions  check your blood sugar 4 times a day.  vary the time of day when you check, between before the 3 meals, and at bedtime.  also check if you have symptoms of your blood sugar being too high or too low.  please keep a record of the readings and bring it to your next appointment here (or you can bring the meter itself).  You can write it on any piece of paper.  please call us sooner if your blood sugar goes below 70, or if you have a lot of readings over 200.   Please reduce the novolog to 80 units, and take it 1 hour before you eat supper. Please decrease the lantus to 100 units  each morning.  Please come back for a follow-up appointment in 1 month.  If necessary, we can change both insulins to just NPH, in the morning

## 2018-03-27 NOTE — Patient Instructions (Addendum)
check your blood sugar 4 times a day.  vary the time of day when you check, between before the 3 meals, and at bedtime.  also check if you have symptoms of your blood sugar being too high or too low.  please keep a record of the readings and bring it to your next appointment here (or you can bring the meter itself).  You can write it on any piece of paper.  please call us sooner if your blood sugar goes below 70, or if you have a lot of readings over 200.   Please reduce the novolog to 80 units, and take it 1 hour before you eat supper. Please decrease the lantus to 100 units each morning.  Please come back for a follow-up appointment in 1 month.  If necessary, we can change both insulins to just NPH, in the morning

## 2018-03-31 ENCOUNTER — Other Ambulatory Visit: Payer: Self-pay | Admitting: Endocrinology

## 2018-04-04 ENCOUNTER — Other Ambulatory Visit: Payer: Self-pay | Admitting: *Deleted

## 2018-04-10 ENCOUNTER — Other Ambulatory Visit: Payer: Self-pay | Admitting: Endocrinology

## 2018-04-11 DIAGNOSIS — H401131 Primary open-angle glaucoma, bilateral, mild stage: Secondary | ICD-10-CM | POA: Diagnosis not present

## 2018-04-24 ENCOUNTER — Other Ambulatory Visit: Payer: Self-pay

## 2018-04-25 ENCOUNTER — Ambulatory Visit (INDEPENDENT_AMBULATORY_CARE_PROVIDER_SITE_OTHER): Payer: Medicare Other | Admitting: Endocrinology

## 2018-04-25 ENCOUNTER — Encounter: Payer: Self-pay | Admitting: Endocrinology

## 2018-04-25 VITALS — BP 142/86 | HR 94 | Temp 98.2°F | Resp 12 | Ht 69.0 in | Wt 214.0 lb

## 2018-04-25 DIAGNOSIS — E11649 Type 2 diabetes mellitus with hypoglycemia without coma: Secondary | ICD-10-CM | POA: Diagnosis not present

## 2018-04-25 MED ORDER — INSULIN ASPART 100 UNIT/ML FLEXPEN: 70.0000 [IU] | PEN_INJECTOR | Freq: Every day | SUBCUTANEOUS | 0 refills | Status: DC

## 2018-04-25 NOTE — Progress Notes (Signed)
Subjective:    Patient ID: Anthony Cuevas, male    DOB: 12-19-1950, 68 y.o.   MRN: [id number]  HPI Pt returns for f/u of diabetes mellitus: DM type: Insulin-requiring type 2 Dx'ed: 0109 Complications: none Therapy: insulin since 2006, and metformin.  DKA: never Severe hypoglycemia: never Pancreatitis: never Pancreatic imaging: never Other: he stopped pioglitizone, due to edema; he takes 2 QD insulins, after poor results with multiple daily injections.   Interval history: continuous glucose monitor data are reviewed today, and the printout is scanned into the record.  It varies from 50-350.  It is in general higher as the day goes on until 6 PM.  it then decreases until 12 MN.  He has 5 months' worth of lantus he wants to use up.   Past Medical History:  Diagnosis Date  . Arthritis   . Depression    improved on sertraline  . Diabetes mellitus, type 2 ([place]) 09/1996  . GERD (gastroesophageal reflux disease) 1990  . History of peptic ulcer disease   . Hyperlipidemia 1992  . Hypertension 05/2003  . Lumbar stenosis L2-3  . Muscle atrophy    in arms from degenerative changes in neck  . Neck pain    chronic  . Smoker   . Wears glasses     Past Surgical History:  Procedure Laterality Date  . CERVICAL DISC SURGERY  04/19/02   fusion, Dr. Lorin Mercy  . CERVICAL DISCECTOMY  1997   partial  . CERVICAL DISCECTOMY  2000  . COLONOSCOPY WITH ESOPHAGOGASTRODUODENOSCOPY (EGD)  06/25/2015  . [facility]  . KNEE ARTHROPLASTY  11/20/2011   Procedure: COMPUTER ASSISTED TOTAL KNEE ARTHROPLASTY;  Surgeon: Marybelle Killings, MD;  Location: [facility];  Service: Orthopedics;  Laterality: Left;  Conversion Left Knee Medial Uni to Total Knee Arthroplasty-Cemented  . LUMBAR LAMINECTOMY/DECOMPRESSION MICRODISCECTOMY N/A 04/28/2015   Procedure: L2-3 Decompression;  Surgeon: Marybelle Killings, MD;  Location: [facility];  Service: Orthopedics;  Laterality: N/A;  . MR MRA DUPLICATE EXAM  INACTIVATE    . MULTIPLE  TOOTH EXTRACTIONS    . Neisen funduplication  3235  . REPLACEMENT TOTAL KNEE  02/14/02, 05/04/02   left- partial  . SCC EXCISION  04/25/01   Dr. March Rummage  . STOMACH SURGERY  1984   PUD, [place]    Social History   Socioeconomic History  . Marital status: Married    Spouse name: Not on file  . Number of children: 3  . Years of education: Not on file  . Highest education level: Not on file  Occupational History  . Occupation: Training and development officer, retired    Comment: Scientific laboratory technician  Social Needs  . Financial resource strain: Not on file  . Food insecurity:    Worry: Not on file    Inability: Not on file  . Transportation needs:    Medical: Not on file    Non-medical: Not on file  Tobacco Use  . Smoking status: Former Smoker    Packs/day: 1.00    Years: 41.00    Pack years: 41.00    Types: Cigarettes    Last attempt to quit: 2014    Years since quitting: 6.2  . Smokeless tobacco: Never Used  Substance and Sexual Activity  . Alcohol use: No    Alcohol/week: 0.0 standard drinks  . Drug use: No  . Sexual activity: Never  Lifestyle  . Physical activity:    Days per week: Not on file  Minutes per session: Not on file  . Stress: Not on file  Relationships  . Social connections:    Talks on phone: Not on file    Gets together: Not on file    Attends religious service: Not on file    Active member of club or organization: Not on file    Attends meetings of clubs or organizations: Not on file    Relationship status: Not on file  . Intimate partner violence:    Fear of current or ex partner: Not on file    Emotionally abused: Not on file    Physically abused: Not on file    Forced sexual activity: Not on file  Other Topics Concern  . 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 B-D INS SYR MICROFINE 1CC/28G 28G X 1/2" 1 ML MISC USE TO INJECT INSULIN FOUR TIMES A DAY 400 each 2  . celecoxib (CELEBREX) 200 MG capsule TAKE 1 CAPSULE DAILY 90 capsule 4  . cholecalciferol (VITAMIN D) 1000 UNITS tablet Take 5,000 Units by mouth 2 (two) times a week.    . Continuous Blood Gluc Sensor (FREESTYLE LIBRE 14 DAY SENSOR) MISC 1 Device by Does not apply route every 14 (fourteen) days. 6 each 3  . cyanocobalamin 1000 MCG tablet Take 1,000 mcg by mouth daily.    . cyclobenzaprine (FLEXERIL) 10 MG tablet TAKE 1 TABLET TWICE A DAY AS NEEDED FOR MUSCLE SPASMS 180 tablet 3  . glucose blood (ONE TOUCH ULTRA TEST) test strip Use as instructed to test blood sugar 3 or 4 times daily.  Diagnosis: E11.649   Insulin dependent. 100 each 3  . glucose blood (PRODIGY AUTOCODE TEST) test strip Use as instructed to test blood sugar 3-4 times per day    . Insulin Glargine (LANTUS SOLOSTAR) 100 UNIT/ML Solostar Pen Inject 100 Units into the skin every morning. 45 pen PRN  . lisinopril (PRINIVIL,ZESTRIL) 20 MG tablet Take 1 tablet (20 mg total) by mouth daily. 90 tablet 3  . LUMIGAN 0.01 % SOLN Place 1 drop into both eyes at bedtime.     . metFORMIN (GLUCOPHAGE) 1000 MG tablet TAKE 1 TABLET TWICE A DAY WITH MEALS 180 tablet 3  . NIASPAN 500 MG CR tablet TAKE 1 TABLET AT BEDTIME 90 tablet 4  . Omega-3 Fatty Acids (FISH OIL PO) Take 1,000 mg by mouth once a week.    . pravastatin (PRAVACHOL) 80 MG tablet Take 1 tablet (80 mg total) by mouth daily. 90 tablet 3  . SURE COMFORT PEN NEEDLES 32G X 4 MM MISC USE TO INJECT INSULIN FOUR TIMES A DAY 100 each 14  . traMADol (ULTRAM) 50 MG tablet Take 1 tablet (50 mg total) by mouth every 8 (eight) hours as needed (sedation caution). Chronic joint/back pain 90 tablet 2   No current facility-administered medications on file prior to visit.     No Known Allergies  Family History  Problem Relation Age of Onset  . Diabetes  Mother   . Hypertension Mother   . Stroke Mother   . Diabetes Sister   . Diabetes Brother   . Diabetes Brother   . Heart disease Brother   . Diabetes Father   .  Cancer Maternal Grandmother        breast  . Lung disease Paternal Grandfather        black lung  . Arthritis Neg Hx   . Prostate cancer Neg Hx   . Colon cancer Neg Hx     BP (!) 142/86   Pulse 94   Temp 98.2 F (36.8 C)   Resp 12   Ht 5\' 9"  (1.753 m)   Wt 214 lb (97.1 kg)   SpO2 98%   BMI 31.60 kg/m    Review of Systems Denies LOC    Objective:   Physical Exam VITAL SIGNS:  See vs page GENERAL: no distress Pulses: foot pulses are intact bilaterally.   MSK: no deformity of the feet or ankles.  CV: trace bilat edema of the legs.   Skin:  no ulcer on the feet or ankles.  normal temp on the feet and ankles.  Neuro: sensation is intact to touch on the feet and ankles.   Ext: There is severe bilateral onychomycosis of the toenails.   Lab Results  Component Value Date   HGBA1C 8.7 (A) 03/27/2018   Lab Results  Component Value Date   CREATININE 0.98 11/26/2017   BUN 17 11/26/2017   NA 138 11/26/2017   K 4.3 11/26/2017   CL 101 11/26/2017   CO2 28 11/26/2017       Assessment & Plan:  Insulin-requiring type 2 DM: worse Hypoglycemia: we discussed.  he should change to NPH only, but he wants to use up lantus.   Edema: This limits rx options.  HTN: is noted today.   Patient Instructions  Your blood pressure is high today.  Please see your primary care provider soon, to have it rechecked check your blood sugar 4 times a day.  vary the time of day when you check, between before the 3 meals, and at bedtime.  also check if you have symptoms of your blood sugar being too high or too low.  please keep a record of the readings and bring it to your next appointment here (or you can bring the meter itself).  You can write it on any piece of paper.  please call us sooner if your blood sugar goes below 70, or if  you have a lot of readings over 200.   Please reduce the novolog to 70 units, before supper. Please continue the same lantus: 100 units each morning.  Please come back for a follow-up appointment in 2 months.  When you run out of Lantus, our plan will be to change both insulins to just NPH, in the morning.

## 2018-04-25 NOTE — Patient Instructions (Addendum)
Your blood pressure is high today.  Please see your primary care provider soon, to have it rechecked check your blood sugar 4 times a day.  vary the time of day when you check, between before the 3 meals, and at bedtime.  also check if you have symptoms of your blood sugar being too high or too low.  please keep a record of the readings and bring it to your next appointment here (or you can bring the meter itself).  You can write it on any piece of paper.  please call us sooner if your blood sugar goes below 70, or if you have a lot of readings over 200.   Please reduce the novolog to 70 units, before supper. Please continue the same lantus: 100 units each morning.  Please come back for a follow-up appointment in 2 months.  When you run out of Lantus, our plan will be to change both insulins to just NPH, in the morning.

## 2018-05-03 ENCOUNTER — Other Ambulatory Visit: Payer: Self-pay | Admitting: *Deleted

## 2018-05-03 NOTE — Telephone Encounter (Signed)
Last office 12/03/2017 for CPE.  Last refilled 02/13/2018 for #90 with 2 refills.  Next Appt: 12/06/2018 for CPE.

## 2018-05-05 MED ORDER — TRAMADOL HCL 50 MG PO TABS: 50.0000 mg | ORAL_TABLET | Freq: Three times a day (TID) | ORAL | 2 refills | Status: DC | PRN

## 2018-05-05 NOTE — Telephone Encounter (Signed)
Sent. Thanks.   

## 2018-06-19 ENCOUNTER — Ambulatory Visit (INDEPENDENT_AMBULATORY_CARE_PROVIDER_SITE_OTHER): Payer: Medicare Other | Admitting: Podiatry

## 2018-06-19 ENCOUNTER — Encounter: Payer: Self-pay | Admitting: Podiatry

## 2018-06-19 ENCOUNTER — Other Ambulatory Visit: Payer: Self-pay

## 2018-06-19 VITALS — Temp 97.7°F

## 2018-06-19 DIAGNOSIS — M79676 Pain in unspecified toe(s): Secondary | ICD-10-CM

## 2018-06-19 DIAGNOSIS — E119 Type 2 diabetes mellitus without complications: Secondary | ICD-10-CM

## 2018-06-19 DIAGNOSIS — B351 Tinea unguium: Secondary | ICD-10-CM | POA: Diagnosis not present

## 2018-06-19 DIAGNOSIS — M2010 Hallux valgus (acquired), unspecified foot: Secondary | ICD-10-CM

## 2018-06-19 NOTE — Progress Notes (Signed)
Patient ID: Anthony Cuevas, male   DOB: May 12, 1950, 68 y.o.   MRN: [id number] Complaint:  Visit Type: Patient returns to my office for continued preventative foot care services. Complaint: Patient states" my nails have grown long and thick and become painful to walk and wear shoes" Patient has been diagnosed with DM with no foot complications. The patient presents for preventative foot care services. No changes to ROS.        Podiatric Exam: Vascular: dorsalis pedis and posterior tibial pulses are palpable bilateral. Capillary return is immediate. Temperature gradient is WNL. Skin turgor WNL  Sensorium: Diminished  Semmes Weinstein monofilament test. Normal tactile sensation bilaterally. Nail Exam: Pt has thick disfigured discolored nails with subungual debris noted bilateral entire nail hallux through fifth toenails.  The hallux toenail is unattached right nail bed except at proximal nail fold. No redness or swelling noted. Ulcer Exam: There is no evidence of ulcer or pre-ulcerative changes or infection. Orthopedic Exam: Muscle tone and strength are WNL. No limitations in general ROM. No crepitus or effusions noted. Foot type and digits show no abnormalities. Bony prominences are unremarkable.HAV B/L Skin: No Porokeratosis. 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 The patient understood the discussion of treatment and procedures well. All questions were answered thoroughly reviewed. Debridement of mycotic and hypertrophic toenails, 1 through 5 bilateral and clearing of subungual debris. No ulceration, no infection noted.   Return Visit-Office Procedure: Patient instructed to return to the office for a follow up visit 3 months for continued evaluation and treatment.    Gardiner Barefoot DPM

## 2018-06-20 ENCOUNTER — Encounter: Payer: Self-pay | Admitting: Endocrinology

## 2018-06-21 ENCOUNTER — Other Ambulatory Visit: Payer: Self-pay

## 2018-06-21 DIAGNOSIS — E11649 Type 2 diabetes mellitus with hypoglycemia without coma: Secondary | ICD-10-CM

## 2018-06-21 MED ORDER — INSULIN ASPART 100 UNIT/ML FLEXPEN: 70.0000 [IU] | PEN_INJECTOR | Freq: Every day | SUBCUTANEOUS | 0 refills | Status: DC

## 2018-06-21 MED ORDER — INSULIN PEN NEEDLE 32G X 4 MM MISC: 2 refills | Status: DC

## 2018-06-21 MED ORDER — INSULIN GLARGINE 100 UNIT/ML SOLOSTAR PEN: 100.0000 [IU] | PEN_INJECTOR | SUBCUTANEOUS | 99 refills | Status: DC

## 2018-06-21 NOTE — Telephone Encounter (Signed)
insulin aspart (NOVOLOG FLEXPEN) 100 UNIT/ML FlexPen 20 pen 0 06/21/2018    Sig - Route: Inject 70 Units into the skin daily before supper. - Subcutaneous   Sent to pharmacy as: insulin aspart (NOVOLOG FLEXPEN) 100 UNIT/ML FlexPen   E-Prescribing Status: Receipt confirmed by pharmacy (06/21/2018 12:34 PM EDT)    Insulin Glargine (LANTUS SOLOSTAR) 100 UNIT/ML Solostar Pen 45 pen PRN 06/21/2018    Sig - Route: Inject 100 Units into the skin every morning. - Subcutaneous   Sent to pharmacy as: Insulin Glargine (LANTUS SOLOSTAR) 100 UNIT/ML Solostar Pen   E-Prescribing Status: Receipt confirmed by pharmacy (06/21/2018 12:34 PM EDT)    Insulin Pen Needle (SURE COMFORT PEN NEEDLES) 32G X 4 MM MISC 100 each 2 06/21/2018    Sig: Use to inject insulin BID; E11.65   Sent to pharmacy as: Insulin Pen Needle (SURE COMFORT PEN NEEDLES) 32G X 4 MM Misc   E-Prescribing Status: Receipt confirmed by pharmacy (06/21/2018 12:33 PM EDT)

## 2018-06-21 NOTE — Telephone Encounter (Signed)
FYI

## 2018-06-23 ENCOUNTER — Other Ambulatory Visit: Payer: Self-pay | Admitting: Endocrinology

## 2018-06-23 DIAGNOSIS — E11649 Type 2 diabetes mellitus with hypoglycemia without coma: Secondary | ICD-10-CM

## 2018-06-26 ENCOUNTER — Other Ambulatory Visit: Payer: Self-pay

## 2018-06-26 ENCOUNTER — Ambulatory Visit (INDEPENDENT_AMBULATORY_CARE_PROVIDER_SITE_OTHER): Payer: Medicare Other | Admitting: Endocrinology

## 2018-06-26 DIAGNOSIS — E119 Type 2 diabetes mellitus without complications: Secondary | ICD-10-CM

## 2018-06-26 DIAGNOSIS — E11649 Type 2 diabetes mellitus with hypoglycemia without coma: Secondary | ICD-10-CM

## 2018-06-26 NOTE — Patient Instructions (Addendum)
check your blood sugar 4 times a day.  vary the time of day when you check, between before the 3 meals, and at bedtime.  also check if you have symptoms of your blood sugar being too high or too low.  please keep a record of the readings and bring it to your next appointment here (or you can bring the meter itself).  You can write it on any piece of paper.  please call us sooner if your blood sugar goes below 70, or if you have a lot of readings over 200.   Please come in to have the a1c checked.   Please come back for a follow-up appointment in 2 months.  When you run out of Lantus, our plan will be to change both insulins to just NPH, in the morning.

## 2018-06-26 NOTE — Progress Notes (Addendum)
Subjective:    Patient ID: Anthony Cuevas, male    DOB: [birth year], 68 y.o.   MRN: [id number]  HPI  telehealth visit today via doxy video visit.  Alternatives to telehealth are presented to this patient, and the patient agrees to the telehealth visit. Pt is advised of the cost of the visit, and agrees to this, also.   Patient is at home, and I am at the office.   Persons attending the telehealth visit: the patient and I Pt returns for f/u of diabetes mellitus: DM type: Insulin-requiring type 2 Dx'ed: 7829 Complications: none Therapy: insulin since 2006, and metformin.   DKA: never Severe hypoglycemia: never.  Pancreatitis: never Pancreatic imaging: never Other: he stopped pioglitizone, due to edema; he takes 2 QD insulins, after poor results with multiple daily injections.   Interval history: I reviewed continuous glucose monitor data.  glucose varies from 70-300's.  It is in general highest at 6 PM, and lowest fasting.  He has several more months' worth of lantus he wants to use up.   Past Medical History:  Diagnosis Date   Arthritis    Depression    improved on sertraline   Diabetes mellitus, type 2 ([place]) 09/1996   GERD (gastroesophageal reflux disease) 1990   History of peptic ulcer disease    Hyperlipidemia 1992   Hypertension 05/2003   Lumbar stenosis L2-3   Muscle atrophy    in arms from degenerative changes in neck   Neck pain    chronic   Smoker    Wears glasses     Past Surgical History:  Procedure Laterality Date   CERVICAL DISC SURGERY  04/19/02   fusion, Dr. Lorin Mercy   CERVICAL DISCECTOMY  1997   partial   CERVICAL DISCECTOMY  2000   COLONOSCOPY WITH ESOPHAGOGASTRODUODENOSCOPY (EGD)  06/25/2015   HEMORRHOID SURGERY  1989   KNEE ARTHROPLASTY  11/20/2011   Procedure: COMPUTER ASSISTED TOTAL KNEE ARTHROPLASTY;  Surgeon: Marybelle Killings, MD;  Location: [facility];  Service: Orthopedics;  Laterality: Left;  Conversion Left Knee Medial Uni to Total Knee  Arthroplasty-Cemented   LUMBAR LAMINECTOMY/DECOMPRESSION MICRODISCECTOMY N/A 04/28/2015   Procedure: L2-3 Decompression;  Surgeon: Marybelle Killings, MD;  Location: [facility];  Service: Orthopedics;  Laterality: N/A;   MR MRA DUPLICATE EXAM  INACTIVATE     MULTIPLE TOOTH EXTRACTIONS     Neisen funduplication  5621   REPLACEMENT TOTAL KNEE  02/14/02, 05/04/02   left- partial   SCC EXCISION  04/25/01   Dr. March Rummage   STOMACH SURGERY  1984   PUD, [facility]    Social History   Socioeconomic History   Marital status: Married    Spouse name: Not on file   Number of children: 3   Years of education: Not on file   Highest education level: Not on file  Occupational History   Occupation: Training and development officer, retired    Comment: Programmer, systems strain: Not on file   Food insecurity:    Worry: Not on file    Inability: Not on file   Transportation needs:    Medical: Not on file    Non-medical: Not on file  Tobacco Use   Smoking status: Former Smoker    Packs/day: 1.00    Years: 41.00    Pack years: 41.00    Types: Cigarettes    Last attempt to quit: 2014    Years since quitting: 6.4   Smokeless tobacco:  Never Used  Substance and Sexual Activity   Alcohol use: No    Alcohol/week: 0.0 standard drinks   Drug use: No   Sexual activity: Never  Lifestyle   Physical activity:    Days per week: Not on file    Minutes per session: Not on file   Stress: Not on file  Relationships   Social connections:    Talks on phone: Not on file    Gets together: Not on file    Attends religious service: Not on file    Active member of club or organization: Not on file    Attends meetings of clubs or organizations: Not on file    Relationship status: Not on file   Intimate partner violence:    Fear of current or ex partner: Not on file    Emotionally abused: Not on file    Physically abused: Not on file    Forced sexual activity: Not on file  Other Topics  Concern   Not on file  Social History Narrative   Retired Education officer, museum, [employer], [employer]ologist, 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     Continuous Blood Gluc Sensor (FREESTYLE LIBRE 14 DAY SENSOR) MISC 1 Device by Does not apply route every 14 (fourteen) days. 6 each 3   cyanocobalamin 1000 MCG tablet Take 1,000 mcg by mouth daily.     cyclobenzaprine (FLEXERIL) 10 MG tablet TAKE 1 TABLET TWICE A DAY AS NEEDED FOR MUSCLE SPASMS 180 tablet 3   glucose blood (ONE TOUCH ULTRA TEST) test strip Use as instructed to test blood sugar 3 or 4 times daily.  Diagnosis: E11.649   Insulin dependent. 100 each 3   glucose blood (PRODIGY AUTOCODE TEST) test strip Use as instructed to test blood sugar 3-4 times per day     Insulin Glargine (LANTUS SOLOSTAR) 100 UNIT/ML Solostar Pen Inject 100 Units into the skin every morning. 45 pen PRN   Insulin Pen Needle (SURE COMFORT PEN NEEDLES) 32G X 4 MM MISC Use to inject insulin BID; E11.65 100 each 2   lisinopril (PRINIVIL,ZESTRIL) 20 MG tablet Take 1 tablet (20 mg total) by mouth daily. 90 tablet 3   LUMIGAN 0.01 % SOLN Place 1 drop into both eyes at bedtime.      metFORMIN (GLUCOPHAGE) 1000 MG tablet TAKE 1 TABLET TWICE A DAY WITH MEALS 180 tablet 3   NIASPAN 500 MG CR tablet TAKE 1 TABLET AT BEDTIME 90 tablet 4   NOVOLOG FLEXPEN 100 UNIT/ML FlexPen INJECT 50 UNITS UNDER THE SKIN DAILY WITH SUPPER (Patient taking differently: 70 Units under the skin daily with supper) 60 mL 3   Omega-3 Fatty Acids (FISH OIL PO) Take 1,000  mg by mouth once a week.     pravastatin (PRAVACHOL) 80 MG tablet Take 1 tablet (80 mg total) by mouth daily. 90 tablet 3   traMADol (ULTRAM) 50 MG tablet Take 1 tablet (50 mg total) by mouth every 8 (eight) hours as  needed (sedation caution). Chronic joint/back pain 90 tablet 2   No current facility-administered medications on file prior to visit.     No Known Allergies  Family History  Problem Relation Age of Onset   Diabetes Mother    Hypertension Mother    Stroke Mother    Diabetes Sister    Diabetes Brother    Diabetes Brother    Heart disease Brother    Diabetes Father    Cancer Maternal Grandmother        breast   Lung disease Paternal Grandfather        black lung   Arthritis Neg Hx    Prostate cancer Neg Hx    Colon cancer Neg Hx      Review of Systems  He denies hypoglycemia.      Objective:   Physical Exam    Lab Results  Component Value Date   HGBA1C 7.2 (A) 06/27/2018       Assessment & Plan:  Insulin-requiring type 2 DM: this is the best control this pt should aim for, given this regimen, which does match insulin to his changing needs throughout the day.    Patient Instructions  check your blood sugar 4 times a day.  vary the time of day when you check, between before the 3 meals, and at bedtime.  also check if you have symptoms of your blood sugar being too high or too low.  please keep a record of the readings and bring it to your next appointment here (or you can bring the meter itself).  You can write it on any piece of paper.  please call us sooner if your blood sugar goes below 70, or if you have a lot of readings over 200.   Please come in to have the a1c checked.   Please come back for a follow-up appointment in 2 months.  When you run out of Lantus, our plan will be to change both insulins to just NPH, in the morning.

## 2018-06-27 ENCOUNTER — Other Ambulatory Visit: Payer: Self-pay

## 2018-06-27 ENCOUNTER — Ambulatory Visit (INDEPENDENT_AMBULATORY_CARE_PROVIDER_SITE_OTHER): Payer: Medicare Other

## 2018-06-27 DIAGNOSIS — E11649 Type 2 diabetes mellitus with hypoglycemia without coma: Secondary | ICD-10-CM | POA: Diagnosis not present

## 2018-06-27 LAB — POCT GLYCOSYLATED HEMOGLOBIN (HGB A1C): Hemoglobin A1C: 7.2 % — AB (ref 4.0–5.6)

## 2018-06-27 NOTE — Progress Notes (Signed)
Pt presents today for nurse visit for completion of A1C. 

## 2018-07-02 ENCOUNTER — Telehealth: Payer: Self-pay | Admitting: Family Medicine

## 2018-07-02 NOTE — Telephone Encounter (Signed)
Pt dropped off parking placard form to be filled out.Placed in RX tower °

## 2018-07-04 NOTE — Telephone Encounter (Signed)
Given to Dr. Damita Dunnings

## 2018-07-05 NOTE — Telephone Encounter (Signed)
Sending to Dr. Damita Dunnings until this is done. Thank you

## 2018-07-05 NOTE — Telephone Encounter (Signed)
I will work on the hardcopy.  Thanks. 

## 2018-07-07 NOTE — Telephone Encounter (Signed)
Form done. Thanks. 

## 2018-07-08 NOTE — Telephone Encounter (Signed)
Patient picked up form

## 2018-07-08 NOTE — Telephone Encounter (Signed)
Left detailed message on voicemail for pickup or call if I am to mail the form.

## 2018-07-09 ENCOUNTER — Encounter: Payer: Self-pay | Admitting: Family Medicine

## 2018-07-09 ENCOUNTER — Ambulatory Visit (INDEPENDENT_AMBULATORY_CARE_PROVIDER_SITE_OTHER)
Admission: RE | Admit: 2018-07-09 | Discharge: 2018-07-09 | Disposition: A | Discharge status: Home / Self Care | Payer: Medicare Other | Source: Ambulatory Visit | Attending: Family Medicine | Admitting: Family Medicine

## 2018-07-09 ENCOUNTER — Ambulatory Visit (INDEPENDENT_AMBULATORY_CARE_PROVIDER_SITE_OTHER): Payer: Medicare Other | Admitting: Family Medicine

## 2018-07-09 ENCOUNTER — Other Ambulatory Visit: Payer: Self-pay

## 2018-07-09 VITALS — BP 148/60 | HR 81 | Temp 97.6°F | Ht 69.0 in | Wt 214.2 lb

## 2018-07-09 DIAGNOSIS — M545 Low back pain, unspecified: Secondary | ICD-10-CM

## 2018-07-09 DIAGNOSIS — R911 Solitary pulmonary nodule: Secondary | ICD-10-CM | POA: Diagnosis not present

## 2018-07-09 DIAGNOSIS — M79642 Pain in left hand: Secondary | ICD-10-CM

## 2018-07-09 DIAGNOSIS — E11649 Type 2 diabetes mellitus with hypoglycemia without coma: Secondary | ICD-10-CM | POA: Diagnosis not present

## 2018-07-09 MED ORDER — INSULIN ASPART 100 UNIT/ML FLEXPEN: PEN_INJECTOR | SUBCUTANEOUS | Status: DC

## 2018-07-09 MED ORDER — CYCLOBENZAPRINE HCL 10 MG PO TABS: ORAL_TABLET | ORAL | 1 refills | Status: DC

## 2018-07-09 NOTE — Progress Notes (Addendum)
Sugar 121 at OV today, with him checking his sugar on his meter.    Hand pain and back pain.  Taking tramadol.  nsaid cautions.  He was taking celebrex and aleve.  He is going to stop aleve.  He is on celebrex at baseline, no ADE on med.   He is still on pravastatin but he didn't think that was contributing to his aches.    L hip pain with walking.  He likely had gait changes prev noted by other MDs related to compensation.  L>R B lower back pain, just above the belt line, horizontal distribution. Flexeril helps his back.  No sciatica pain. No FCNAVD.  No dysuria.    L hand pain, L 2nd and 3rd MCPs and PIPs.  He has some B hand cramping occ.    Previous CT discussed with patient. IMPRESSION: 1. Subpleural nodular lesion in the superior segment left lower lobe is likely due to scarring. However, it is new from prior exam and malignancy cannot be excluded. Lung-RADS 3, probably benign findings. Short-term follow-up in 6 months is recommended with repeat low-dose chest CT without contrast (please use the following order, "CT CHEST LCS NODULE FOLLOW-UP W/O CM"). 2. Suspect cirrhosis. 3. Aortic atherosclerosis (ICD10-170.0). Three-vessel coronary artery calcification. 4.  Emphysema (ICD10-J43.9).  Meds, vitals, and allergies reviewed.   ROS: Per HPI unless specifically indicated in ROS section   nad ncat rrr ctab Abd soft not ttp Ext w/w edema.  Chronic osteoarthritic changes noted on the MCPs and IP joints on the hand, especially along the second and third fingers on left hand. Distally neurovascular intact.  He can still make a composite fist with both hands but with more pain on the left hand. Bilateral lower back tender to palpation but not in midline.  Straight leg raise negative bilaterally.

## 2018-07-09 NOTE — Patient Instructions (Signed)
Go to the lab on the way out.  We'll contact you with your xray report. Let me see about options (potentially talking to Dr. Lorin Mercy) when I see your xrays.  Use flexeril if needed, sedation caution.  Stop aleve but continue celebrex.  Okay to take tramadol 3 times a day if needed for pain, sedation caution.  Take care.  Glad to see you.

## 2018-07-10 ENCOUNTER — Telehealth: Payer: Self-pay | Admitting: Family Medicine

## 2018-07-10 DIAGNOSIS — M79642 Pain in left hand: Secondary | ICD-10-CM | POA: Insufficient documentation

## 2018-07-10 DIAGNOSIS — M545 Low back pain, unspecified: Secondary | ICD-10-CM | POA: Insufficient documentation

## 2018-07-10 DIAGNOSIS — R911 Solitary pulmonary nodule: Secondary | ICD-10-CM | POA: Insufficient documentation

## 2018-07-10 DIAGNOSIS — M79643 Pain in unspecified hand: Secondary | ICD-10-CM | POA: Insufficient documentation

## 2018-07-10 NOTE — Assessment & Plan Note (Addendum)
See notes on imaging. We'll update Dr. Lorin Mercy about his situation and see if he has other ideas to offer.  Reasonable to increase frequency of tramadol in the meantime as needed with sedation caution.  He agrees. >25 minutes spent in face to face time with patient, >50% spent in counselling or coordination of care.

## 2018-07-10 NOTE — Assessment & Plan Note (Signed)
Straight leg raise negative, still able to bear weight.  Reasonable to increase tramadol in the meantime.  Sedation caution discussed.  NSAID cautions discussed.  See after visit summary.

## 2018-07-10 NOTE — Telephone Encounter (Signed)
It looks like the patient is due for short-term follow-up of lung nodule with a CT scan.  Is your clinic addressing this?  Please let me know if I need to put in the order.  Thanks.

## 2018-07-10 NOTE — Assessment & Plan Note (Signed)
I will check to see if the lung cancer screening clinic will be addressing their short-term follow-up imaging.  Discussed.

## 2018-07-14 ENCOUNTER — Telehealth: Payer: Self-pay | Admitting: Family Medicine

## 2018-07-14 NOTE — Telephone Encounter (Signed)
Notify pt.  See below.  It sounds like he does not have to have anything done about the incidental foreign body.  It may make sense for the patient to talk to Dr. Lorin Mercy about arthritis in his hand otherwise.  Thanks.

## 2018-07-14 NOTE — Telephone Encounter (Signed)
-----   Message from Josetta Huddle, [place] sent at 07/11/2018  5:16 PM EDT -----  ----- Message ----- From: Marybelle Killings, MD Sent: 07/11/2018   5:00 PM EDT To: Josetta Huddle, CMA  X-rays reviewed.  Unless foreign body is tender he has some drainage there is sharp pain at the site of the foreign body otherwise it does not need to be removed and is not contributory to some arthritis that he has in his fingers.  If he has further concerns he can make an appointment and I will be happy to evaluate him and go over the x-rays. ----- Message ----- From: Josetta Huddle, CMA Sent: 07/11/2018   4:30 PM EDT To: Marybelle Killings, MD

## 2018-07-15 NOTE — Telephone Encounter (Signed)
Many thanks.  I'll defer.

## 2018-07-15 NOTE — Telephone Encounter (Signed)
Yes sir. We will take care of him. Burgess Estelle is our low dose CT screening nurse navigator and can let us know plan and when he has him scheduled for his next CT scan. Thanks so much for reaching out.   Faythe Casa, NP 07/15/2018 1:35 PM

## 2018-07-15 NOTE — Telephone Encounter (Signed)
Patient advised.

## 2018-07-23 ENCOUNTER — Telehealth: Payer: Self-pay | Admitting: *Deleted

## 2018-07-23 DIAGNOSIS — Z122 Encounter for screening for malignant neoplasm of respiratory organs: Secondary | ICD-10-CM

## 2018-07-23 DIAGNOSIS — R918 Other nonspecific abnormal finding of lung field: Secondary | ICD-10-CM

## 2018-07-23 DIAGNOSIS — Z87891 Personal history of nicotine dependence: Secondary | ICD-10-CM

## 2018-07-23 NOTE — Telephone Encounter (Signed)
Patient is agreeable to schedule lcs nodule f/u ct scan

## 2018-07-29 ENCOUNTER — Other Ambulatory Visit: Payer: Self-pay

## 2018-07-29 ENCOUNTER — Ambulatory Visit
Admission: RE | Admit: 2018-07-29 | Discharge: 2018-07-29 | Disposition: A | Discharge status: Home / Self Care | Payer: Medicare Other | Source: Ambulatory Visit | Attending: Nurse Practitioner | Admitting: Nurse Practitioner

## 2018-07-29 DIAGNOSIS — R918 Other nonspecific abnormal finding of lung field: Secondary | ICD-10-CM | POA: Diagnosis not present

## 2018-07-29 DIAGNOSIS — Z87891 Personal history of nicotine dependence: Secondary | ICD-10-CM | POA: Diagnosis not present

## 2018-07-29 DIAGNOSIS — Z122 Encounter for screening for malignant neoplasm of respiratory organs: Secondary | ICD-10-CM | POA: Diagnosis not present

## 2018-07-30 ENCOUNTER — Encounter: Payer: Self-pay | Admitting: *Deleted

## 2018-07-31 ENCOUNTER — Telehealth: Payer: Self-pay | Admitting: Family Medicine

## 2018-07-31 NOTE — Telephone Encounter (Signed)
I saw his report about his repeat chest CT scan that was recently done. We had previously talked about him seeing cardiology and GI, based on his last CT.  I had previously put in referrals for those appointments. I do not see in the EMR where he ever went to see cardiology and GI in the meantime.  Please see if he is willing to go (or if he somehow went in the records are not available currently in EMR).  Thanks.

## 2018-08-01 NOTE — Telephone Encounter (Signed)
Patient says he never heard from anyone about setting up the cardiology and gi appts.

## 2018-08-03 NOTE — Telephone Encounter (Signed)
I will route this over to [facility].  [place]-patient had previous referrals.  Can those be used again?  Please contact patient.  Please let me know if new referrals need to be placed.  Thanks.

## 2018-08-05 NOTE — Telephone Encounter (Signed)
Please place New Cardiology Referral and GI Referral, errors happened with both of these Referrals and they were authorized without ever getting scheduled.

## 2018-08-06 ENCOUNTER — Other Ambulatory Visit: Payer: Self-pay

## 2018-08-06 MED ORDER — TRAMADOL HCL 50 MG PO TABS: 50.0000 mg | ORAL_TABLET | Freq: Three times a day (TID) | ORAL | 0 refills | Status: DC | PRN

## 2018-08-06 NOTE — Telephone Encounter (Signed)
Refilled in PCP absence

## 2018-08-06 NOTE — Telephone Encounter (Signed)
Refill request for Tramadol to [facility] for 90 day supply. Last filled on 05/05/2018 for #90 with 2 refills. LOV was 07/09/2018 and future appointment on 12/06/2018

## 2018-08-08 NOTE — Telephone Encounter (Signed)
This was taken care of

## 2018-08-11 NOTE — Telephone Encounter (Signed)
Many thanks-please let me know if I need to do anything else.  Routed back to [place] to close the loop.  I appreciate the help of all involved.

## 2018-08-12 ENCOUNTER — Telehealth: Payer: Self-pay

## 2018-08-12 NOTE — Telephone Encounter (Signed)
Virtual Visit Pre-Appointment Phone Call  "Anthony Cuevas, I am calling you today to discuss your upcoming appointment. We are currently trying to limit exposure to the virus that causes COVID-19 by seeing patients at home rather than in the office."  1. "What is the BEST phone number to call the day of the visit?" - include this in appointment notes  2. "Do you have or have access to (through a family member/friend) a smartphone with video capability that we can use for your visit?" a. If yes - list this number in appt notes as "cell" (if different from BEST phone #) and list the appointment type as a VIDEO visit in appointment notes b. If no - list the appointment type as a PHONE visit in appointment notes  3. Confirm consent - "In the setting of the current Covid19 crisis, you are scheduled for a (phone or video visit with your provider on 08/22/2018 at [facility].  Just as we do with many in-office visits, in order for you to participate in this visit, we must obtain consent.  If you'd like, I can send this to your mychart (if signed up) or email for you to review.  Otherwise, I can obtain your verbal consent now.  All virtual visits are billed to your insurance company just like a normal visit would be.  By agreeing to a virtual visit, we'd like you to understand that the technology does not allow for your provider to perform an examination, and thus may limit your provider's ability to fully assess your condition. If your provider identifies any concerns that need to be evaluated in person, we will make arrangements to do so.  Finally, though the technology is pretty good, we cannot assure that it will always work on either your or our end, and in the setting of a video visit, we may have to convert it to a phone-only visit.  In either situation, we cannot ensure that we have a secure connection.  Are you willing to proceed?" STAFF: Did the patient verbally acknowledge consent to telehealth visit? Document  YES/NO here: YES  4. Advise patient to be prepared - "Two hours prior to your appointment, go ahead and check your blood pressure, pulse, oxygen saturation, and your weight (if you have the equipment to check those) and write them all down. When your visit starts, your provider will ask you for this information. If you have an Apple Watch or Kardia device, please plan to have heart rate information ready on the day of your appointment. Please have a pen and paper handy nearby the day of the visit as well."  5. Give patient instructions for MyChart download to smartphone OR Doximity/Doxy.me as below if video visit (depending on what platform provider is using)  6. Inform patient they will receive a phone call 15 minutes prior to their appointment time (may be from unknown caller ID) so they should be prepared to answer    TELEPHONE CALL NOTE  Anthony Cuevas has been deemed a candidate for a follow-up tele-health visit to limit community exposure during the Covid-19 pandemic. I spoke with the patient via phone to ensure availability of phone/video source, confirm preferred email & phone number, and discuss instructions and expectations.  I reminded Anthony Cuevas to be prepared with any vital sign and/or heart rhythm information that could potentially be obtained via home monitoring, at the time of his visit. I reminded Anthony Cuevas to expect a phone call prior to  his visit.  [name] 08/12/2018 4:05 PM   INSTRUCTIONS FOR DOWNLOADING THE MYCHART APP TO SMARTPHONE  - The patient must first make sure to have activated MyChart and know their login information - If Apple, go to [employer] and type in MyChart in the search bar and download the app. If Android, ask patient to go to [employer] and type in [place] in the search bar and download the app. The app is free but as with any other app downloads, their phone may require them to verify saved payment information or  Apple/Android password.  - The patient will need to then log into the app with their MyChart username and password, and select [facility] as their healthcare provider to link the account. When it is time for your visit, go to the MyChart app, find appointments, and click Begin Video Visit. Be sure to Select Allow for your device to access the Microphone and Camera for your visit. You will then be connected, and your provider will be with you shortly.  **If they have any issues connecting, or need assistance please contact MyChart service desk [phone])**  **If using a computer, in order to ensure the best quality for their visit they will need to use either of the following Internet Browsers: [employer], or Google Chrome**  IF USING DOXIMITY or DOXY.ME - The patient will receive a link just prior to their visit by text.     FULL LENGTH CONSENT FOR TELE-HEALTH VISIT   I hereby voluntarily request, consent and authorize [facility] and its employed or contracted physicians, physician assistants, nurse practitioners or other licensed health care professionals (the Practitioner), to provide me with telemedicine health care services (the "Services") as deemed necessary by the treating Practitioner. I acknowledge and consent to receive the Services by the Practitioner via telemedicine. I understand that the telemedicine visit will involve communicating with the Practitioner through live audiovisual communication technology and the disclosure of certain medical information by electronic transmission. I acknowledge that I have been given the opportunity to request an in-person assessment or other available alternative prior to the telemedicine visit and am voluntarily participating in the telemedicine visit.  I understand that I have the right to withhold or withdraw my consent to the use of telemedicine in the course of my care at any time, without affecting my right to future care  or treatment, and that the Practitioner or I may terminate the telemedicine visit at any time. I understand that I have the right to inspect all information obtained and/or recorded in the course of the telemedicine visit and may receive copies of available information for a reasonable fee.  I understand that some of the potential risks of receiving the Services via telemedicine include:  Marland Kitchen Delay or interruption in medical evaluation due to technological equipment failure or disruption; . Information transmitted may not be sufficient (e.g. poor resolution of images) to allow for appropriate medical decision making by the Practitioner; and/or  . In rare instances, security protocols could fail, causing a breach of personal health information.  Furthermore, I acknowledge that it is my responsibility to provide information about my medical history, conditions and care that is complete and accurate to the best of my ability. I acknowledge that Practitioner's advice, recommendations, and/or decision may be based on factors not within their control, such as incomplete or inaccurate data provided by me or distortions of diagnostic images or specimens that may result from electronic transmissions.  I understand that the practice of medicine is not an exact science and that Practitioner makes no warranties or guarantees regarding treatment outcomes. I acknowledge that I will receive a copy of this consent concurrently upon execution via email to the email address I last provided but may also request a printed copy by calling the office of [facility].    I understand that my insurance will be billed for this visit.   I have read or had this consent read to me. . I understand the contents of this consent, which adequately explains the benefits and risks of the Services being provided via telemedicine.  . I have been provided ample opportunity to ask questions regarding this consent and the Services and have had  my questions answered to my satisfaction. . I give my informed consent for the services to be provided through the use of telemedicine in my medical care  By participating in this telemedicine visit I agree to the above.

## 2018-08-13 DIAGNOSIS — H5203 Hypermetropia, bilateral: Secondary | ICD-10-CM | POA: Diagnosis not present

## 2018-08-13 DIAGNOSIS — H524 Presbyopia: Secondary | ICD-10-CM | POA: Diagnosis not present

## 2018-08-13 DIAGNOSIS — E119 Type 2 diabetes mellitus without complications: Secondary | ICD-10-CM | POA: Diagnosis not present

## 2018-08-13 DIAGNOSIS — H401131 Primary open-angle glaucoma, bilateral, mild stage: Secondary | ICD-10-CM | POA: Diagnosis not present

## 2018-08-13 DIAGNOSIS — H2513 Age-related nuclear cataract, bilateral: Secondary | ICD-10-CM | POA: Diagnosis not present

## 2018-08-13 DIAGNOSIS — I10 Essential (primary) hypertension: Secondary | ICD-10-CM | POA: Diagnosis not present

## 2018-08-13 LAB — HM DIABETES EYE EXAM

## 2018-08-20 ENCOUNTER — Encounter: Payer: Self-pay | Admitting: Family Medicine

## 2018-08-21 NOTE — Progress Notes (Signed)
Virtual Visit via Video Note   This visit type was conducted due to national recommendations for restrictions regarding the COVID-19 Pandemic (e.g. social distancing) in an effort to limit this patient's exposure and mitigate transmission in our community.  Due to his co-morbid illnesses, this patient is at least at moderate risk for complications without adequate follow up.  This format is felt to be most appropriate for this patient at this time.  All issues noted in this document were discussed and addressed.  A limited physical exam was performed with this format.  Please refer to the patient's chart for his consent to telehealth for [facility].   I connected with  Anthony Cuevas on 08/22/18 by a video enabled telemedicine application and verified that I am speaking with the correct person using two identifiers. I discussed the limitations of evaluation and management by telemedicine. The patient expressed understanding and agreed to proceed.   Evaluation Performed:  Follow-up visit  Date:  08/22/2018   ID:  Anthony Cuevas, Anthony Cuevas [birth year], MRN [id number]  Patient Location:  [place] [address]   Provider location:   [facility], [place] office  PCP:  Tonia Ghent, MD  Cardiologist:  Patsy Baltimore   Chief Complaint:  CAD on CT scan, shortness of breath    History of Present Illness:    Anthony Cuevas is a 68 y.o. male who presents via audio/video conferencing for a telehealth visit today.   The patient does not symptoms concerning for COVID-19 infection (fever, chills, cough, or new SHORTNESS OF BREATH).   Patient has a past medical history of  former smoker, >20 years, quit 7-8 years ago Coronary artery disease on CT scan Hyperlipidemia Referred by Dr. Damita Dunnings for coronary artery disease, shortness of breath, coronary calcification on CT scan  Discussed recent CT scan results with him Images pulled up and reviewed  personally by myself CT scan chest July 29, 2018 with at least moderate three-vessel coronary atherosclerosis, Moderate diffuse aortic atherosclerosis Emphysema  Lab work reviewed Hemoglobin A1c 9.9 down to 7.2 in the past year LDL 79 October 2019 Total cholesterol 131  SOB, couple years Same over few years Relatively inactive, no regular exercise program  Some family history cad  Prior CV studies:   The following studies were reviewed today:  CT scan as above  Past Medical History:  Diagnosis Date  . Arthritis   . Depression    improved on sertraline  . Diabetes mellitus, type 2 ([place]) 09/1996  . GERD (gastroesophageal reflux disease) 1990  . History of peptic ulcer disease   . Hyperlipidemia 1992  . Hypertension 05/2003  . Lumbar stenosis L2-3  . Muscle atrophy    in arms from degenerative changes in neck  . Neck pain    chronic  . Smoker   . Wears glasses    Past Surgical History:  Procedure Laterality Date  . CERVICAL DISC SURGERY  04/19/02   fusion, Dr. Lorin Mercy  . CERVICAL DISCECTOMY  1997   partial  . CERVICAL DISCECTOMY  2000  . COLONOSCOPY WITH ESOPHAGOGASTRODUODENOSCOPY (EGD)  06/25/2015  . [place]  . KNEE ARTHROPLASTY  11/20/2011   Procedure: COMPUTER ASSISTED TOTAL KNEE ARTHROPLASTY;  Surgeon: Marybelle Killings, MD;  Location: [facility];  Service: Orthopedics;  Laterality: Left;  Conversion Left Knee Medial Uni to Total Knee Arthroplasty-Cemented  . LUMBAR LAMINECTOMY/DECOMPRESSION MICRODISCECTOMY N/A 04/28/2015   Procedure: L2-3 Decompression;  Surgeon: Marybelle Killings, MD;  Location: [facility];  Service: Orthopedics;  Laterality: N/A;  . MR MRA DUPLICATE EXAM  INACTIVATE    . MULTIPLE TOOTH EXTRACTIONS    . Neisen funduplication  1610  . REPLACEMENT TOTAL KNEE  02/14/02, 05/04/02   left- partial  . SCC EXCISION  04/25/01   Dr. March Rummage  . STOMACH SURGERY  1984   PUD, HH     Current Meds  Medication Sig  . celecoxib (CELEBREX) 200 MG capsule TAKE 1  CAPSULE DAILY  . cholecalciferol (VITAMIN D) 1000 UNITS tablet Take 5,000 Units by mouth 2 (two) times a week.  . Continuous Blood Gluc Sensor (FREESTYLE LIBRE 14 DAY SENSOR) MISC 1 Device by Does not apply route every 14 (fourteen) days.  . cyanocobalamin 1000 MCG tablet Take 1,000 mcg by mouth daily.  . cyclobenzaprine (FLEXERIL) 10 MG tablet TAKE 1 TABLET TWICE A DAY AS NEEDED FOR MUSCLE SPASMS  . glucose blood (ONE TOUCH ULTRA TEST) test strip Use as instructed to test blood sugar 3 or 4 times daily.  Diagnosis: E11.649   Insulin dependent.  Marland Kitchen glucose blood (PRODIGY AUTOCODE TEST) test strip Use as instructed to test blood sugar 3-4 times per day  . insulin aspart (NOVOLOG FLEXPEN) 100 UNIT/ML FlexPen 70 Units under the skin daily with supper  . Insulin Glargine (LANTUS SOLOSTAR) 100 UNIT/ML Solostar Pen Inject 100 Units into the skin every morning.  . Insulin Pen Needle (SURE COMFORT PEN NEEDLES) 32G X 4 MM MISC Use to inject insulin BID; E11.65  . lisinopril (PRINIVIL,ZESTRIL) 20 MG tablet Take 1 tablet (20 mg total) by mouth daily.  Marland Kitchen LUMIGAN 0.01 % SOLN Place 1 drop into both eyes at bedtime.   . metFORMIN (GLUCOPHAGE) 1000 MG tablet TAKE 1 TABLET TWICE A DAY WITH MEALS  . NIASPAN 500 MG CR tablet TAKE 1 TABLET AT BEDTIME  . Omega-3 Fatty Acids (FISH OIL PO) Take 1,000 mg by mouth once a week.  . pravastatin (PRAVACHOL) 80 MG tablet Take 1 tablet (80 mg total) by mouth daily.  . traMADol (ULTRAM) 50 MG tablet Take 1 tablet (50 mg total) by mouth every 8 (eight) hours as needed (sedation caution). Chronic joint/back pain     Allergies:   Patient has no known allergies.   Social History   Tobacco Use  . Smoking status: Former Smoker    Packs/day: 1.00    Years: 41.00    Pack years: 41.00    Types: Cigarettes    Quit date: 2014    Years since quitting: 6.5  . Smokeless tobacco: Never Used  Substance Use Topics  . Alcohol use: No    Alcohol/week: 0.0 standard drinks  . Drug  use: No     Current Outpatient Medications on File Prior to Visit  Medication Sig Dispense Refill  . celecoxib (CELEBREX) 200 MG capsule TAKE 1 CAPSULE DAILY 90 capsule 4  . cholecalciferol (VITAMIN D) 1000 UNITS tablet Take 5,000 Units by mouth 2 (two) times a week.    . Continuous Blood Gluc Sensor (FREESTYLE LIBRE 14 DAY SENSOR) MISC 1 Device by Does not apply route every 14 (fourteen) days. 6 each 3  . cyanocobalamin 1000 MCG tablet Take 1,000 mcg by mouth daily.    . cyclobenzaprine (FLEXERIL) 10 MG tablet TAKE 1 TABLET TWICE A DAY AS NEEDED FOR MUSCLE SPASMS 180 tablet 1  . glucose blood (ONE TOUCH ULTRA TEST) test strip Use as instructed to test blood sugar 3  or 4 times daily.  Diagnosis: E11.649   Insulin dependent. 100 each 3  . glucose blood (PRODIGY AUTOCODE TEST) test strip Use as instructed to test blood sugar 3-4 times per day    . insulin aspart (NOVOLOG FLEXPEN) 100 UNIT/ML FlexPen 70 Units under the skin daily with supper    . Insulin Glargine (LANTUS SOLOSTAR) 100 UNIT/ML Solostar Pen Inject 100 Units into the skin every morning. 45 pen PRN  . Insulin Pen Needle (SURE COMFORT PEN NEEDLES) 32G X 4 MM MISC Use to inject insulin BID; E11.65 100 each 2  . lisinopril (PRINIVIL,ZESTRIL) 20 MG tablet Take 1 tablet (20 mg total) by mouth daily. 90 tablet 3  . LUMIGAN 0.01 % SOLN Place 1 drop into both eyes at bedtime.     . metFORMIN (GLUCOPHAGE) 1000 MG tablet TAKE 1 TABLET TWICE A DAY WITH MEALS 180 tablet 3  . NIASPAN 500 MG CR tablet TAKE 1 TABLET AT BEDTIME 90 tablet 4  . Omega-3 Fatty Acids (FISH OIL PO) Take 1,000 mg by mouth once a week.    . pravastatin (PRAVACHOL) 80 MG tablet Take 1 tablet (80 mg total) by mouth daily. 90 tablet 3  . traMADol (ULTRAM) 50 MG tablet Take 1 tablet (50 mg total) by mouth every 8 (eight) hours as needed (sedation caution). Chronic joint/back pain 270 tablet 0  . B-D INS SYR MICROFINE 1CC/28G 28G X 1/2" 1 ML MISC USE TO INJECT INSULIN FOUR TIMES  A DAY 400 each 2   No current facility-administered medications on file prior to visit.      Family Hx: The patient's family history includes Cancer in his maternal grandmother; Diabetes in his brother, brother, father, mother, and sister; Heart disease in his brother; Hypertension in his mother; Lung disease in his paternal grandfather; Stroke in his mother. There is no history of Arthritis, Prostate cancer, or Colon cancer.  ROS:   Please see the history of present illness.    Review of Systems  Constitutional: Negative.   HENT: Negative.   Respiratory: Positive for shortness of breath.   Cardiovascular: Negative.   Gastrointestinal: Negative.   Musculoskeletal: Negative.   Neurological: Negative.   Psychiatric/Behavioral: Negative.   All other systems reviewed and are negative.     Labs/Other Tests and Data Reviewed:    Recent Labs: 11/26/2017: ALT 45; BUN 17; Creatinine, Ser 0.98; Potassium 4.3; Sodium 138   Recent Lipid Panel Lab Results  Component Value Date/Time   CHOL 131 11/26/2017 09:40 AM   TRIG 207.0 (H) 11/26/2017 09:40 AM   HDL 33.00 (L) 11/26/2017 09:40 AM   CHOLHDL 4 11/26/2017 09:40 AM   LDLCALC 68 07/13/2014 08:14 AM   LDLDIRECT 79.0 11/26/2017 09:40 AM    Wt Readings from Last 3 Encounters:  08/22/18 214 lb (97.1 kg)  07/09/18 214 lb 3 oz (97.2 kg)  04/25/18 214 lb (97.1 kg)     Exam:    Vital Signs: Vital signs may also be detailed in the HPI Ht 5\' 9"  (1.753 m)   Wt 214 lb (97.1 kg)   BMI 31.60 kg/m   Wt Readings from Last 3 Encounters:  08/22/18 214 lb (97.1 kg)  07/09/18 214 lb 3 oz (97.2 kg)  04/25/18 214 lb (97.1 kg)   Temp Readings from Last 3 Encounters:  07/09/18 97.6 F (36.4 C)  06/19/18 97.7 F (36.5 C)  04/25/18 98.2 F (36.8 C)   BP Readings from Last 3 Encounters:  07/09/18 (!) 148/60  04/25/18 (!) 142/86  03/27/18 140/70   Pulse Readings from Last 3 Encounters:  07/09/18 81  04/25/18 94  03/27/18 (!) 119      Well nourished, well developed male in no acute distress. Constitutional:  oriented to person, place, and time. No distress.  Head: Normocephalic and atraumatic.  Eyes:  no discharge. No scleral icterus.  Neck: Normal range of motion. Neck supple.  Pulmonary/Chest: No audible wheezing, no distress, appears comfortable Musculoskeletal: Normal range of motion.  no  tenderness or deformity.  Neurological:   Coordination normal. Full exam not performed Skin:  No rash Psychiatric:  normal mood and affect. behavior is normal. Thought content normal.    ASSESSMENT & PLAN:    Problem List Items Addressed This Visit      Cardiology Problems   CAD (coronary artery disease) - Primary   HLD (hyperlipidemia)   Essential hypertension     Other   Diabetes mellitus type II, uncontrolled ([place])     CT scan with heavy coronary calcification, aortic atherosclerosis seen Risk factors include former smoker, hyperlipidemia now well controlled, diabetes recently better controlled Chronic shortness of breath, unable to exclude anginal symptoms --Recommended pharmacologic Myoview for risk stratification, angina, heavy coronary calcification -Recommend he continue to work on aggressive diabetes control Discussed anginal symptoms to watch for in the future and call our office for any worsening symptoms   COVID-19 Education: The signs and symptoms of COVID-19 were discussed with the patient and how to seek care for testing (follow up with PCP or arrange E-visit).  The importance of social distancing was discussed today.  Patient Risk:   After full review of this patients clinical status, I feel that they are at least moderate risk at this time.  Time:   Today, I have spent 45 minutes with the patient with telehealth technology discussing the cardiac and medical problems/diagnoses detailed above   10 min spent reviewing the chart prior to patient visit today   Medication Adjustments/Labs and Tests  Ordered: Current medicines are reviewed at length with the patient today.  Concerns regarding medicines are outlined above.   Tests Ordered: No tests ordered   Medication Changes: No changes made   Disposition: Follow-up on annual basis 1 year   Signed, Ida Rogue, MD  [facility] Office [address] [place] [address], [place], [place] [address]

## 2018-08-22 ENCOUNTER — Other Ambulatory Visit: Payer: Self-pay

## 2018-08-22 ENCOUNTER — Telehealth (INDEPENDENT_AMBULATORY_CARE_PROVIDER_SITE_OTHER): Payer: Medicare Other | Admitting: Cardiovascular Disease

## 2018-08-22 VITALS — Ht 69.0 in | Wt 214.0 lb

## 2018-08-22 DIAGNOSIS — E782 Mixed hyperlipidemia: Secondary | ICD-10-CM | POA: Diagnosis not present

## 2018-08-22 DIAGNOSIS — I10 Essential (primary) hypertension: Secondary | ICD-10-CM

## 2018-08-22 DIAGNOSIS — I25118 Atherosclerotic heart disease of native coronary artery with other forms of angina pectoris: Secondary | ICD-10-CM

## 2018-08-22 DIAGNOSIS — E11649 Type 2 diabetes mellitus with hypoglycemia without coma: Secondary | ICD-10-CM | POA: Diagnosis not present

## 2018-08-22 NOTE — Addendum Note (Signed)
Addended by: Valora Corporal on: 08/22/2018 05:35 PM   Modules accepted: Orders

## 2018-08-22 NOTE — Patient Instructions (Addendum)
Needs pharmacologic Myoview possibly next Wednesday Thursday or Friday Reason for study  angina, shortness of breath, heavy coronary calcification on CT scan, diabetes  No medication changes   Medication Instructions:  No changes  If you need a refill on your cardiac medications before your next appointment, please call your pharmacy.    Lab work: No new labs needed   If you have labs (blood work) drawn today and your tests are completely normal, you will receive your results only by: Marland Kitchen MyChart Message (if you have MyChart) OR . A paper copy in the mail If you have any lab test that is abnormal or we need to change your treatment, we will call you to review the results.   Testing/Procedures: [place]  Your caregiver has ordered a Stress Test with nuclear imaging. The purpose of this test is to evaluate the blood supply to your heart muscle. This procedure is referred to as a "Non-Invasive Stress Test." This is because other than having an IV started in your vein, nothing is inserted or "invades" your body. Cardiac stress tests are done to find areas of poor blood flow to the heart by determining the extent of coronary artery disease (CAD). Some patients exercise on a treadmill, which naturally increases the blood flow to your heart, while others who are  unable to walk on a treadmill due to physical limitations have a pharmacologic/chemical stress agent called Lexiscan . This medicine will mimic walking on a treadmill by temporarily increasing your coronary blood flow.   Please note: these test may take anywhere between 2-4 hours to complete  PLEASE REPORT TO [place] TO GO  Date of Procedure: Thursday July 30th  Arrival Time for Procedure: 08:15 AM  Instructions regarding medication:   __XX__ : Hold diabetes medication morning of procedure  _XX___:  DECREASE Novolog insulin the night before to 30  units.   PLEASE NOTIFY THE OFFICE AT LEAST 61 HOURS IN ADVANCE IF YOU ARE UNABLE TO KEEP YOUR APPOINTMENT.  [phone] AND  PLEASE NOTIFY NUCLEAR MEDICINE AT [facility] AT LEAST 24 HOURS IN ADVANCE IF YOU ARE UNABLE TO KEEP YOUR APPOINTMENT. [phone]  How to prepare for your Myoview test:  1. Do not eat or drink after midnight 2. No caffeine for 24 hours prior to test 3. No smoking 24 hours prior to test. 4. Your medication may be taken with water.  If your doctor stopped a medication because of this test, do not take that medication. 5. Ladies, please do not wear dresses.  Skirts or pants are appropriate. Please wear a short sleeve shirt. 6. No perfume, cologne or lotion. 7. Wear comfortable walking shoes. No heels!   Follow-Up: At [facility], you and your health needs are our priority.  As part of our continuing mission to provide you with exceptional heart care, we have created designated Provider Care Teams.  These Care Teams include your primary Cardiologist (physician) and Advanced Practice Providers (APPs -  Physician Assistants and Nurse Practitioners) who all work together to provide you with the care you need, when you need it.  . You will need a follow up appointment in 12 months .   Please call our office 2 months in advance to schedule this appointment.    . Providers on your designated Care Team:   . Murray Hodgkins, NP . Christell Faith, PA-C . Marrianne Mood, PA-C  Any Other Special Instructions  Will Be Listed Below (If Applicable).  For educational health videos Log in to : [email or web address] Or : [email or web address], password : [email or web address]

## 2018-08-28 ENCOUNTER — Encounter: Payer: Self-pay | Admitting: Gastroenterology

## 2018-08-28 ENCOUNTER — Ambulatory Visit (INDEPENDENT_AMBULATORY_CARE_PROVIDER_SITE_OTHER): Payer: Medicare Other | Admitting: Gastroenterology

## 2018-08-28 VITALS — Ht 69.0 in | Wt 214.0 lb

## 2018-08-28 DIAGNOSIS — I25118 Atherosclerotic heart disease of native coronary artery with other forms of angina pectoris: Secondary | ICD-10-CM | POA: Diagnosis not present

## 2018-08-28 DIAGNOSIS — K760 Fatty (change of) liver, not elsewhere classified: Secondary | ICD-10-CM | POA: Diagnosis not present

## 2018-08-28 NOTE — Patient Instructions (Signed)
If you are age 67 or older, your body mass index should be between 23-30. Your Body mass index is 31.6 kg/m. If this is out of the aforementioned range listed, please consider follow up with your Primary Care Provider.  If you are age 22 or younger, your body mass index should be between 19-25. Your Body mass index is 31.6 kg/m. If this is out of the aformentioned range listed, please consider follow up with your Primary Care Provider.   Follow up as needed.  It was a pleasure to see you today!  Dr. Loletha Carrow

## 2018-08-28 NOTE — Progress Notes (Signed)
This patient contacted our office requesting a physician telemedicine consultation regarding clinical questions and/or test results. Due to COVID restrictions, this was felt to be the most appropriate method of patient evaluation. If new patient, they were referred by Dr. Elsie Stain  Participants on the conference : myself and patient   The patient consented to this consultation and was aware that a charge will be placed through their insurance.  They were also made aware of the limitations of telemedicine.  I was in my office and the patient was at home.   Encounter time:  Total time 30 minutes, with 24 minutes spent with patient on Doximity   _____________________________________________________________________________________________              [facility] Consult Note:  History: Anthony Cuevas 08/28/2018  Referring provider: Tonia Ghent, MD  Reason for consult/chief complaint: fatty liver (follow up fatty liver )   Subjective  HPI: Last seen for EGD and colonoscopy May 2017 for iron deficiency anemia.  Single nonbleeding gastric AVM was seen, no bleeding source seen in colon, preparation suboptimal with recommendation for screening colonoscopy at a 5-year interval.  [name] was referred to see [place] for fatty liver.  It appears that his LFTs were elevated on multiple occasions in the last several years.  An ultrasound was done about 18 months ago showing fatty liver.  No signs of cirrhosis were described.  His blood sugar was under poor control with hemoglobin A1c over 9, but is doing much better this year.  He has also been followed by Dr. Loanne Drilling of endocrinology, and with a glucose monitor, better adherence to diet and medication regimen, his hemoglobin A1c is down to 7.2 on last check.  He also said his weight is down about 5 pounds in the last several months, and he has tried his best to improve his diabetic control and weight management.  He has no  family history of cirrhosis or liver cancer.  His father drank heavily, and [name] says he drank heavily for years until stopping in 1992.  ROS:  Review of Systems  Constitutional: Negative for appetite change and unexpected weight change.  HENT: Negative for mouth sores and voice change.   Eyes: Negative for pain and redness.  Respiratory: Negative for cough and shortness of breath.   Cardiovascular: Negative for chest pain and palpitations.  Genitourinary: Negative for dysuria and hematuria.  Musculoskeletal: Positive for arthralgias. Negative for myalgias.  Skin: Negative for pallor and rash.  Neurological: Negative for weakness and headaches.  Hematological: Negative for adenopathy.  Psychiatric/Behavioral:       Previous depression requiring medication, but says he is doing well these days.     Past Medical History: Past Medical History:  Diagnosis Date  . Arthritis   . Depression    improved on sertraline  . Diabetes mellitus, type 2 ([place]) 09/1996  . GERD (gastroesophageal reflux disease) 1990  . History of peptic ulcer disease   . Hyperlipidemia 1992  . Hypertension 05/2003  . Lumbar stenosis L2-3  . Muscle atrophy    in arms from degenerative changes in neck  . Neck pain    chronic  . Smoker   . Wears glasses      Past Surgical History: Past Surgical History:  Procedure Laterality Date  . CERVICAL DISC SURGERY  04/19/02   fusion, Dr. Lorin Mercy  . CERVICAL DISCECTOMY  1997   partial  . CERVICAL DISCECTOMY  2000  . COLONOSCOPY WITH ESOPHAGOGASTRODUODENOSCOPY (EGD)  06/25/2015  . [place]  . KNEE ARTHROPLASTY  11/20/2011   Procedure: COMPUTER ASSISTED TOTAL KNEE ARTHROPLASTY;  Surgeon: Marybelle Killings, MD;  Location[place];  Service: Orthopedics;  Laterality: Left;  Conversion Left Knee Medial Uni to Total Knee Arthroplasty-Cemented  . LUMBAR LAMINECTOMY/DECOMPRESSION MICRODISCECTOMY N/A 04/28/2015   Procedure: L2-3 Decompression;  Surgeon: Marybelle Killings,  MD;  Location: [place];  Service: Orthopedics;  Laterality: N/A;  . MR MRA DUPLICATE EXAM  INACTIVATE    . MULTIPLE TOOTH EXTRACTIONS    . Neisen funduplication  5035  . REPLACEMENT TOTAL KNEE  02/14/02, 05/04/02   left- partial  . SCC EXCISION  04/25/01   Dr. March Rummage  . STOMACH SURGERY  1984   PUD, HH     Family History: Family History  Problem Relation Age of Onset  . Diabetes Mother   . Hypertension Mother   . Stroke Mother   . Diabetes Sister   . Diabetes Brother   . Diabetes Brother   . Heart disease Brother   . Diabetes Father   . Breast cancer Maternal Grandmother        breast  . Lung disease Paternal Grandfather        black lung  . Arthritis Neg Hx   . Prostate cancer Neg Hx   . Colon cancer Neg Hx   . Stomach cancer Neg Hx     Social History: Social History   Socioeconomic History  . Marital status: Married    Spouse name: Not on file  . Number of children: 3  . Years of education: Not on file  . Highest education level: Not on file  Occupational History  . Occupation: Training and development officer, retired    Comment: Scientific laboratory technician  Social Needs  . Financial resource strain: Not on file  . Food insecurity    Worry: Not on file    Inability: Not on file  . Transportation needs    Medical: Not on file    Non-medical: Not on file  Tobacco Use  . Smoking status: Former Smoker    Packs/day: 1.00    Years: 41.00    Pack years: 41.00    Types: Cigarettes    Quit date: 2014    Years since quitting: 6.5  . Smokeless tobacco: Never Used  Substance and Sexual Activity  . Alcohol use: No    Alcohol/week: 0.0 standard drinks  . Drug use: No  . Sexual activity: Never  Lifestyle  . Physical activity    Days per week: Not on file    Minutes per session: Not on file  . Stress: Not on file  Relationships  . Social Herbalist on phone: Not on file    Gets together: Not on file    Attends religious service: Not on file    Active member of club or  organization: Not on file    Attends meetings of clubs or organizations: Not on file    Relationship status: Not on file  Other Topics Concern  . 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 B-D INS SYR MICROFINE 1CC/28G 28G X 1/2" 1 ML MISC USE TO INJECT  INSULIN FOUR TIMES A DAY 400 each 2  . celecoxib (CELEBREX) 200 MG capsule TAKE 1 CAPSULE DAILY 90 capsule 4  . cholecalciferol (VITAMIN D) 1000 UNITS tablet Take 5,000 Units by mouth 2 (two) times a week.    . Continuous Blood Gluc Sensor (FREESTYLE LIBRE 14 DAY SENSOR) MISC 1 Device by Does not apply route every 14 (fourteen) days. 6 each 3  . cyanocobalamin 1000 MCG tablet Take 1,000 mcg by mouth daily.    . cyclobenzaprine (FLEXERIL) 10 MG tablet TAKE 1 TABLET TWICE A DAY AS NEEDED FOR MUSCLE SPASMS 180 tablet 1  . glucose blood (ONE TOUCH ULTRA TEST) test strip Use as instructed to test blood sugar 3 or 4 times daily.  Diagnosis: E11.649   Insulin dependent. 100 each 3  . glucose blood (PRODIGY AUTOCODE TEST) test strip Use as instructed to test blood sugar 3-4 times per day    . insulin aspart (NOVOLOG FLEXPEN) 100 UNIT/ML FlexPen 70 Units under the skin daily with supper    . Insulin Glargine (LANTUS SOLOSTAR) 100 UNIT/ML Solostar Pen Inject 100 Units into the skin every morning. 45 pen PRN  . Insulin Pen Needle (SURE COMFORT PEN NEEDLES) 32G X 4 MM MISC Use to inject insulin BID; E11.65 100 each 2  . lisinopril (PRINIVIL,ZESTRIL) 20 MG tablet Take 1 tablet (20 mg total) by mouth daily. 90 tablet 3  . LUMIGAN 0.01 % SOLN Place 1 drop into both eyes at bedtime.     . metFORMIN (GLUCOPHAGE) 1000 MG tablet TAKE 1 TABLET TWICE A DAY WITH MEALS 180 tablet 3  . NIASPAN 500  MG CR tablet TAKE 1 TABLET AT BEDTIME 90 tablet 4  . Omega-3 Fatty Acids (FISH OIL PO) Take 1,000 mg by mouth once a week.    . pravastatin (PRAVACHOL) 80 MG tablet Take 1 tablet (80 mg total) by mouth daily. 90 tablet 3  . traMADol (ULTRAM) 50 MG tablet Take 1 tablet (50 mg total) by mouth every 8 (eight) hours as needed (sedation caution). Chronic joint/back pain 270 tablet 0   No current facility-administered medications for this visit.       ___________________________________________________________________ Objective   Exam:  Ht 5\' 9"  (1.753 m)   Wt 214 lb (97.1 kg)   BMI 31.60 kg/m    No exam-virtual visit  Well-appearing, pleasant and conversational  Labs:  CBC Latest Ref Rng & Units 03/02/2017 11/23/2016 08/14/2016  WBC 4.0 - 10.5 K/uL 7.2 8.9 6.3  Hemoglobin 13.0 - 17.0 g/dL 13.6 14.7 14.0  Hematocrit 39.0 - 52.0 % 41.6 44.2 41.0  Platelets 150.0 - 400.0 K/uL 262.0 296.0 268.0   CMP Latest Ref Rng & Units 11/26/2017 03/02/2017 11/23/2016  Glucose 70 - 99 mg/dL 262(H) 150(H) 221(H)  BUN 6 - 23 mg/dL 17 19 19   Creatinine 0.40 - 1.50 mg/dL 0.98 0.99 1.08  Sodium 135 - 145 mEq/L 138 137 138  Potassium 3.5 - 5.1 mEq/L 4.3 4.2 4.5  Chloride 96 - 112 mEq/L 101 102 101  CO2 19 - 32 mEq/L 28 25 28   Calcium 8.4 - 10.5 mg/dL 9.3 9.7 9.9  Total Protein 6.0 - 8.3 g/dL 7.3 7.6 7.5  Total Bilirubin 0.2 - 1.2 mg/dL 0.8 0.5 0.5  Alkaline Phos 39 - 117 U/L 63 84 81  AST 0 - 37 U/L 35 69(H) 49(H)  ALT 0 - 53 U/L 45 79(H) 61(H)   Hgb A1c = 7.2 in late May,  Was 8.7 in Feb, and 6.9  in Nov 2019  Radiologic Studies:  CLINICAL DATA:  Elevated LFTs   EXAM: ULTRASOUND ABDOMEN LIMITED RIGHT UPPER QUADRANT   COMPARISON:  None.   FINDINGS: Gallbladder:   No gallstones or wall thickening visualized. No sonographic Murphy sign noted by sonographer.   Common bile duct:   Diameter: Normal caliber, 4 mm   Liver:   Increased echotexture compatible with fatty infiltration. No  focal abnormality or biliary ductal dilatation. Portal vein is patent on color Doppler imaging with normal direction of blood flow towards the liver.   IMPRESSION: Fatty infiltration of the liver.   No acute findings.     Electronically Signed   By: Rolm Baptise M.D.   On: 03/09/2017 11:34  _________________________________________________  07/29/18 screening Chest CT :  CLINICAL DATA:  68 year old asymptomatic male former smoker with 41 pack-year smoking history, quit smoking 5 years prior, presenting for six-month follow-up.   EXAM: CT CHEST WITHOUT CONTRAST FOR LUNG CANCER SCREENING NODULE FOLLOW-UP   TECHNIQUE: Multidetector CT imaging of the chest was performed following the standard protocol without IV contrast.   COMPARISON:  01/01/2018 screening chest CT.   FINDINGS: Cardiovascular: Normal heart size. No significant pericardial effusion/thickening. Three-vessel coronary atherosclerosis. Atherosclerotic nonaneurysmal thoracic aorta. Normal caliber pulmonary arteries.   Mediastinum/Nodes: No discrete thyroid nodules. Unremarkable esophagus. No pathologically enlarged axillary, mediastinal or hilar lymph nodes, noting limited sensitivity for the detection of hilar adenopathy on this noncontrast study.   Lungs/Pleura: No pneumothorax. No pleural effusion. Mild paraseptal and centrilobular emphysema. No acute consolidative airspace disease or lung masses. No significant growth of the previously visualized scattered pulmonary nodules. No new significant pulmonary nodules.   Upper abdomen: No acute abnormality.   Musculoskeletal:  No aggressive appearing focal osseous lesions.   IMPRESSION: 1. Lung-RADS 2, benign appearance or behavior. Continue annual screening with low-dose chest CT without contrast in 12 months. 2. Three-vessel coronary atherosclerosis.   Aortic Atherosclerosis (ICD10-I70.0) and Emphysema (ICD10-J43.9).     Electronically Signed   By:  Ilona Sorrel M.D.   On: 07/29/2018 13:49  Assessment: Encounter Diagnosis  Name Primary?  . Nonalcoholic fatty liver disease Yes    Related to metabolic syndrome, and fortunately his diabetes has lately been under improved control.  We discussed the natural history of his condition, including the possibility that longstanding fatty liver can lead to cirrhosis and therefore increased risk of liver cancer.  His imaging and labs do not suggest cirrhosis.  His LFTs were also normal on last check in October 2019.  I encouraged him to continue efforts at weight management and long-term glucose and lipid control.  Those are the only measures that have so far been shown to improve fatty liver.  Plan:  No other testing appears necessary at this point.  I would gladly see him again as needed.  Thank you for the courtesy of this consult.  Please call me with any questions or concerns.  Nelida Meuse III  CC: Referring provider noted above

## 2018-08-29 ENCOUNTER — Other Ambulatory Visit: Payer: Self-pay

## 2018-08-29 ENCOUNTER — Encounter
Admission: RE | Admit: 2018-08-29 | Discharge: 2018-08-29 | Disposition: A | Discharge status: Home / Self Care | Payer: Medicare Other | Source: Ambulatory Visit | Attending: Cardiovascular Disease | Admitting: Cardiovascular Disease

## 2018-08-29 DIAGNOSIS — E11649 Type 2 diabetes mellitus with hypoglycemia without coma: Secondary | ICD-10-CM | POA: Insufficient documentation

## 2018-08-29 DIAGNOSIS — E782 Mixed hyperlipidemia: Secondary | ICD-10-CM | POA: Diagnosis not present

## 2018-08-29 DIAGNOSIS — I25118 Atherosclerotic heart disease of native coronary artery with other forms of angina pectoris: Secondary | ICD-10-CM | POA: Insufficient documentation

## 2018-08-29 LAB — NM MYOCAR MULTI W/SPECT W/WALL MOTION / EF
Estimated workload: 1 METS
Exercise duration (min): 0 min
Exercise duration (sec): 0 s
LV dias vol: 82 mL (ref 62–150)
LV sys vol: 25 mL
MPHR: 154 {beats}/min
Peak HR: 99 {beats}/min
Percent HR: 64 %
Rest HR: 87 {beats}/min
SDS: 1
SRS: 0
SSS: 1
TID: 1.07

## 2018-08-29 MED ORDER — REGADENOSON 0.4 MG/5ML IV SOLN
0.4000 mg | Freq: Once | INTRAVENOUS | Status: AC
  Administered 2018-08-29: 0.4 mg via INTRAVENOUS
  Filled 2018-08-29: qty 5

## 2018-08-29 MED ORDER — TECHNETIUM TC 99M TETROFOSMIN IV KIT
30.0000 | PACK | Freq: Once | INTRAVENOUS | Status: AC | PRN
  Administered 2018-08-29: 09:00:00 32.15 via INTRAVENOUS

## 2018-08-29 MED ORDER — TECHNETIUM TC 99M TETROFOSMIN IV KIT
10.0000 | PACK | Freq: Once | INTRAVENOUS | Status: AC | PRN
  Administered 2018-08-29: 08:00:00 10.912 via INTRAVENOUS

## 2018-09-24 ENCOUNTER — Ambulatory Visit (INDEPENDENT_AMBULATORY_CARE_PROVIDER_SITE_OTHER): Payer: Medicare Other | Admitting: Podiatry

## 2018-09-24 ENCOUNTER — Other Ambulatory Visit: Payer: Self-pay

## 2018-09-24 ENCOUNTER — Encounter: Payer: Self-pay | Admitting: Podiatry

## 2018-09-24 DIAGNOSIS — M79676 Pain in unspecified toe(s): Secondary | ICD-10-CM

## 2018-09-24 DIAGNOSIS — B351 Tinea unguium: Secondary | ICD-10-CM

## 2018-09-24 DIAGNOSIS — E119 Type 2 diabetes mellitus without complications: Secondary | ICD-10-CM

## 2018-09-24 NOTE — Progress Notes (Signed)
Patient ID: Anthony Cuevas, male   DOB: 12/28/1950, 67 y.o.   MRN: [id number] Complaint:  Visit Type: Patient returns to my office for continued preventative foot care services. Complaint: Patient states" my nails have grown long and thick and become painful to walk and wear shoes" Patient has been diagnosed with DM with no foot complications. The patient presents for preventative foot care services. No changes to ROS.        Podiatric Exam: Vascular: dorsalis pedis and posterior tibial pulses are palpable bilateral. Capillary return is immediate. Temperature gradient is WNL. Skin turgor WNL  Sensorium: Diminished  Semmes Weinstein monofilament test. Normal tactile sensation bilaterally. Nail Exam: Pt has thick disfigured discolored nails with subungual debris noted bilateral entire nail hallux through fifth toenails.  The hallux toenail is unattached right nail bed except at proximal nail fold. No redness or swelling noted. Ulcer Exam: There is no evidence of ulcer or pre-ulcerative changes or infection. Orthopedic Exam: Muscle tone and strength are WNL. No limitations in general ROM. No crepitus or effusions noted. Foot type and digits show no abnormalities. Bony prominences are unremarkable.HAV B/L Skin: No Porokeratosis. 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 The patient understood the discussion of treatment and procedures well. All questions were answered thoroughly reviewed. Debridement of mycotic and hypertrophic toenails, 1 through 5 bilateral and clearing of subungual debris. No ulceration, no infection noted.   Return Visit-Office Procedure: Patient instructed to return to the office for a follow up visit 3 months for continued evaluation and treatment.    Ernesto Zukowski DPM 

## 2018-10-02 ENCOUNTER — Ambulatory Visit (INDEPENDENT_AMBULATORY_CARE_PROVIDER_SITE_OTHER): Payer: Medicare Other

## 2018-10-02 DIAGNOSIS — Z23 Encounter for immunization: Secondary | ICD-10-CM | POA: Diagnosis not present

## 2018-10-21 ENCOUNTER — Telehealth: Payer: Self-pay | Admitting: Family Medicine

## 2018-10-21 NOTE — Telephone Encounter (Signed)
[place] with [facility] is calling in regards to the Requisition forms they faxed over on 9/16.  They wanted to verify that these forms were received and back with the provider.  [place] c/b #  [phone] Ex [phone]  She would like a call back if these were received or not.

## 2018-10-21 NOTE — Telephone Encounter (Signed)
Returned the call to [place] and faxed saying neither the patient nor the provider is interested in this Genetic Cardiovascular Risk Assessment.

## 2018-10-31 DIAGNOSIS — E119 Type 2 diabetes mellitus without complications: Secondary | ICD-10-CM | POA: Diagnosis not present

## 2018-11-06 ENCOUNTER — Other Ambulatory Visit: Payer: Self-pay | Admitting: Family Medicine

## 2018-11-08 ENCOUNTER — Other Ambulatory Visit: Payer: Self-pay

## 2018-11-09 MED ORDER — TRAMADOL HCL 50 MG PO TABS: 50.0000 mg | ORAL_TABLET | Freq: Three times a day (TID) | ORAL | 0 refills | Status: DC | PRN

## 2018-11-09 NOTE — Telephone Encounter (Signed)
Sent. Thanks.  Has f/u pending.  

## 2018-11-30 DIAGNOSIS — E119 Type 2 diabetes mellitus without complications: Secondary | ICD-10-CM | POA: Diagnosis not present

## 2018-12-04 ENCOUNTER — Ambulatory Visit: Payer: Medicare Other

## 2018-12-04 ENCOUNTER — Ambulatory Visit (INDEPENDENT_AMBULATORY_CARE_PROVIDER_SITE_OTHER): Payer: Medicare HMO

## 2018-12-04 ENCOUNTER — Other Ambulatory Visit (INDEPENDENT_AMBULATORY_CARE_PROVIDER_SITE_OTHER): Payer: Medicare HMO

## 2018-12-04 ENCOUNTER — Other Ambulatory Visit: Payer: Self-pay | Admitting: Family Medicine

## 2018-12-04 DIAGNOSIS — Z0000 Encounter for general adult medical examination without abnormal findings: Secondary | ICD-10-CM

## 2018-12-04 DIAGNOSIS — E559 Vitamin D deficiency, unspecified: Secondary | ICD-10-CM | POA: Diagnosis not present

## 2018-12-04 DIAGNOSIS — E11649 Type 2 diabetes mellitus with hypoglycemia without coma: Secondary | ICD-10-CM | POA: Diagnosis not present

## 2018-12-04 LAB — LIPID PANEL
Cholesterol: 144 mg/dL (ref 0–200)
HDL: 31 mg/dL — ABNORMAL LOW (ref >39.00)
NonHDL: 113.02
Total CHOL/HDL Ratio: 5
Triglycerides: 254 mg/dL — ABNORMAL HIGH (ref 0.0–149.0)
VLDL: 50.8 mg/dL — ABNORMAL HIGH (ref 0.0–40.0)

## 2018-12-04 LAB — COMPREHENSIVE METABOLIC PANEL
ALT: 37 U/L (ref 0–53)
AST: 35 U/L (ref 0–37)
Albumin: 4.3 g/dL (ref 3.5–5.2)
Alkaline Phosphatase: 58 U/L (ref 39–117)
BUN: 18 mg/dL (ref 6–23)
CO2: 26 mEq/L (ref 19–32)
Calcium: 9.5 mg/dL (ref 8.4–10.5)
Chloride: 101 mEq/L (ref 96–112)
Creatinine, Ser: 1.04 mg/dL (ref 0.40–1.50)
GFR: 71.25 mL/min (ref >60.00)
Glucose, Bld: 196 mg/dL — ABNORMAL HIGH (ref 70–99)
Potassium: 4.7 mEq/L (ref 3.5–5.1)
Sodium: 139 mEq/L (ref 135–145)
Total Bilirubin: 0.3 mg/dL (ref 0.2–1.2)
Total Protein: 7.2 g/dL (ref 6.0–8.3)

## 2018-12-04 LAB — CBC WITH DIFFERENTIAL/PLATELET
Basophils Absolute: 0.1 10*3/uL (ref 0.0–0.1)
Basophils Relative: 0.8 % (ref 0.0–3.0)
Eosinophils Absolute: 0.2 10*3/uL (ref 0.0–0.7)
Eosinophils Relative: 2.4 % (ref 0.0–5.0)
HCT: 37.5 % — ABNORMAL LOW (ref 39.0–52.0)
Hemoglobin: 12.4 g/dL — ABNORMAL LOW (ref 13.0–17.0)
Lymphocytes Relative: 24.8 % (ref 12.0–46.0)
Lymphs Abs: 1.9 10*3/uL (ref 0.7–4.0)
MCHC: 33 g/dL (ref 30.0–36.0)
MCV: 93.7 fl (ref 78.0–100.0)
Monocytes Absolute: 1.1 10*3/uL — ABNORMAL HIGH (ref 0.1–1.0)
Monocytes Relative: 14.3 % — ABNORMAL HIGH (ref 3.0–12.0)
Neutro Abs: 4.4 10*3/uL (ref 1.4–7.7)
Neutrophils Relative %: 57.7 % (ref 43.0–77.0)
Platelets: 196 10*3/uL (ref 150.0–400.0)
RBC: 4 Mil/uL — ABNORMAL LOW (ref 4.22–5.81)
RDW: 13.3 % (ref 11.5–15.5)
WBC: 7.6 10*3/uL (ref 4.0–10.5)

## 2018-12-04 LAB — LDL CHOLESTEROL, DIRECT: Direct LDL: 71 mg/dL

## 2018-12-04 LAB — TSH: TSH: 4.35 u[IU]/mL (ref 0.35–4.50)

## 2018-12-04 LAB — HEMOGLOBIN A1C: Hgb A1c MFr Bld: 8 % — ABNORMAL HIGH (ref 4.6–6.5)

## 2018-12-04 LAB — VITAMIN D 25 HYDROXY (VIT D DEFICIENCY, FRACTURES): VITD: 38.58 ng/mL (ref 30.00–100.00)

## 2018-12-04 NOTE — Patient Instructions (Signed)
Anthony Cuevas , Thank you for taking time to come for your Medicare Wellness Visit. I appreciate your ongoing commitment to your health goals. Please review the following plan we discussed and let me know if I can assist you in the future.   Screening recommendations/referrals: Colonoscopy: up to date, completed 06/25/2015 Recommended yearly ophthalmology/optometry visit for glaucoma screening and checkup Recommended yearly dental visit for hygiene and checkup  Vaccinations: Influenza vaccine: up to date, completed 10/02/2018 Pneumococcal vaccine: will get at next office visit Tdap vaccine: up to date, completed 12/07/2011 Shingles vaccine: will check with insurance    Advanced directives: Advance directive discussed with you today. Even though you declined this today please call our office should you change your mind and we can give you the proper paperwork for you to fill out.   Conditions/risks identified: diabetes, hypertension, hyperlipidemia  Next appointment: 12/06/2018 @ 9:45 am   Preventive Care 65 Years and Older, Male Preventive care refers to lifestyle choices and visits with your health care provider that can promote health and wellness. What does preventive care include?  A yearly physical exam. This is also called an annual well check.  Dental exams once or twice a year.  Routine eye exams. Ask your health care provider how often you should have your eyes checked.  Personal lifestyle choices, including:  Daily care of your teeth and gums.  Regular physical activity.  Eating a healthy diet.  Avoiding tobacco and drug use.  Limiting alcohol use.  Practicing safe sex.  Taking low doses of aspirin every day.  Taking vitamin and mineral supplements as recommended by your health care provider. What happens during an annual well check? The services and screenings done by your health care provider during your annual well check will depend on your age, overall health,  lifestyle risk factors, and family history of disease. Counseling  Your health care provider may ask you questions about your:  Alcohol use.  Tobacco use.  Drug use.  Emotional well-being.  Home and relationship well-being.  Sexual activity.  Eating habits.  History of falls.  Memory and ability to understand (cognition).  Work and work Statistician. Screening  You may have the following tests or measurements:  Height, weight, and BMI.  Blood pressure.  Lipid and cholesterol levels. These may be checked every 5 years, or more frequently if you are over 67 years old.  Skin check.  Lung cancer screening. You may have this screening every year starting at age 44 if you have a 30-pack-year history of smoking and currently smoke or have quit within the past 15 years.  Fecal occult blood test (FOBT) of the stool. You may have this test every year starting at age 8.  Flexible sigmoidoscopy or colonoscopy. You may have a sigmoidoscopy every 5 years or a colonoscopy every 10 years starting at age 11.  Prostate cancer screening. Recommendations will vary depending on your family history and other risks.  Hepatitis C blood test.  Hepatitis B blood test.  Sexually transmitted disease (STD) testing.  Diabetes screening. This is done by checking your blood sugar (glucose) after you have not eaten for a while (fasting). You may have this done every 1-3 years.  Abdominal aortic aneurysm (AAA) screening. You may need this if you are a current or former smoker.  Osteoporosis. You may be screened starting at age 46 if you are at high risk. Talk with your health care provider about your test results, treatment options, and if necessary, the  need for more tests. Vaccines  Your health care provider may recommend certain vaccines, such as:  Influenza vaccine. This is recommended every year.  Tetanus, diphtheria, and acellular pertussis (Tdap, Td) vaccine. You may need a Td booster  every 10 years.  Zoster vaccine. You may need this after age 2.  Pneumococcal 13-valent conjugate (PCV13) vaccine. One dose is recommended after age 49.  Pneumococcal polysaccharide (PPSV23) vaccine. One dose is recommended after age 64. Talk to your health care provider about which screenings and vaccines you need and how often you need them. This information is not intended to replace advice given to you by your health care provider. Make sure you discuss any questions you have with your health care provider. Document Released: 02/12/2015 Document Revised: 10/06/2015 Document Reviewed: 11/17/2014 Elsevier Interactive Patient Education  2017 [place] Prevention in the Home Falls can cause injuries. They can happen to people of all ages. There are many things you can do to make your home safe and to help prevent falls. What can I do on the outside of my home?  Regularly fix the edges of walkways and driveways and fix any cracks.  Remove anything that might make you trip as you walk through a door, such as a raised step or threshold.  Trim any bushes or trees on the path to your home.  Use bright outdoor lighting.  Clear any walking paths of anything that might make someone trip, such as rocks or tools.  Regularly check to see if handrails are loose or broken. Make sure that both sides of any steps have handrails.  Any raised decks and porches should have guardrails on the edges.  Have any leaves, snow, or ice cleared regularly.  Use sand or salt on walking paths during winter.  Clean up any spills in your garage right away. This includes oil or grease spills. What can I do in the bathroom?  Use night lights.  Install grab bars by the toilet and in the tub and shower. Do not use towel bars as grab bars.  Use non-skid mats or decals in the tub or shower.  If you need to sit down in the shower, use a plastic, non-slip stool.  Keep the floor dry. Clean up any  water that spills on the floor as soon as it happens.  Remove soap buildup in the tub or shower regularly.  Attach bath mats securely with double-sided non-slip rug tape.  Do not have throw rugs and other things on the floor that can make you trip. What can I do in the bedroom?  Use night lights.  Make sure that you have a light by your bed that is easy to reach.  Do not use any sheets or blankets that are too big for your bed. They should not hang down onto the floor.  Have a firm chair that has side arms. You can use this for support while you get dressed.  Do not have throw rugs and other things on the floor that can make you trip. What can I do in the kitchen?  Clean up any spills right away.  Avoid walking on wet floors.  Keep items that you use a lot in easy-to-reach places.  If you need to reach something above you, use a strong step stool that has a grab bar.  Keep electrical cords out of the way.  Do not use floor polish or wax that makes floors slippery. If you must use wax,  use non-skid floor wax.  Do not have throw rugs and other things on the floor that can make you trip. What can I do with my stairs?  Do not leave any items on the stairs.  Make sure that there are handrails on both sides of the stairs and use them. Fix handrails that are broken or loose. Make sure that handrails are as long as the stairways.  Check any carpeting to make sure that it is firmly attached to the stairs. Fix any carpet that is loose or worn.  Avoid having throw rugs at the top or bottom of the stairs. If you do have throw rugs, attach them to the floor with carpet tape.  Make sure that you have a light switch at the top of the stairs and the bottom of the stairs. If you do not have them, ask someone to add them for you. What else can I do to help prevent falls?  Wear shoes that:  Do not have high heels.  Have rubber bottoms.  Are comfortable and fit you well.  Are closed  at the toe. Do not wear sandals.  If you use a stepladder:  Make sure that it is fully opened. Do not climb a closed stepladder.  Make sure that both sides of the stepladder are locked into place.  Ask someone to hold it for you, if possible.  Clearly mark and make sure that you can see:  Any grab bars or handrails.  First and last steps.  Where the edge of each step is.  Use tools that help you move around (mobility aids) if they are needed. These include:  Canes.  Walkers.  Scooters.  Crutches.  Turn on the lights when you go into a dark area. Replace any light bulbs as soon as they burn out.  Set up your furniture so you have a clear path. Avoid moving your furniture around.  If any of your floors are uneven, fix them.  If there are any pets around you, be aware of where they are.  Review your medicines with your doctor. Some medicines can make you feel dizzy. This can increase your chance of falling. Ask your doctor what other things that you can do to help prevent falls. This information is not intended to replace advice given to you by your health care provider. Make sure you discuss any questions you have with your health care provider. Document Released: 11/12/2008 Document Revised: 06/24/2015 Document Reviewed: 02/20/2014 Elsevier Interactive Patient Education  2017 [employer].

## 2018-12-04 NOTE — Progress Notes (Signed)
PCP notes:  Health Maintenance: Wants flu vaccine Will check with insurance about Shingrix   Abnormal Screenings: None   Patient concerns: none   Nurse concerns: none   Next PCP appt.: 12/06/2018 @ 9:45 am

## 2018-12-04 NOTE — Progress Notes (Addendum)
Subjective:   Anthony Cuevas is a 68 y.o. male who presents for Medicare Annual/Subsequent preventive examination.  Review of Systems:    This visit is being conducted through telemedicine via telephone at the nurse health advisor's home address due to the COVID-19 pandemic. This patient has given me verbal consent via doximity to conduct this visit, patient states they are participating from their home address. Patient and myself are on the telephone call. There is no referral for this visit. Some vital signs may be absent or patient reported.    Patient identification: identified by name, DOB, and current address   Cardiac Risk Factors include: advanced age (>80men, >32 women);male gender;diabetes mellitus;dyslipidemia;hypertension     Objective:    Vitals: There were no vitals taken for this visit.  There is no height or weight on file to calculate BMI.  Advanced Directives 12/04/2018 11/26/2017 11/23/2016 08/06/2015 06/25/2015 04/28/2015 04/27/2015  Does Patient Have a Medical Advance Directive? No No No No No No No  Would patient like information on creating a medical advance directive? No - Patient declined No - Patient declined Yes (MAU/Ambulatory/Procedural Areas - Information given) Yes - Educational materials given No - patient declined information No - patient declined information No - patient declined information  Pre-existing out of facility DNR order (yellow form or pink MOST form) - - - - - - -    Tobacco Social History   Tobacco Use  Smoking Status Former Smoker  . Packs/day: 1.00  . Years: 41.00  . Pack years: 41.00  . Types: Cigarettes  . Quit date: 2014  . Years since quitting: 6.8  Smokeless Tobacco Never Used     Counseling given: Not Answered   Clinical Intake:  Pre-visit preparation completed: Yes  Pain : 0-10 Pain Score: 5  Pain Type: Chronic pain Pain Location: Back Pain Orientation: Lower Pain Descriptors / Indicators: Aching Pain Onset: More  than a month ago Pain Frequency: Constant     Nutritional Risks: None Diabetes: Yes CBG done?: No Did pt. bring in CBG monitor from home?: No  How often do you need to have someone help you when you read instructions, pamphlets, or other written materials from your doctor or pharmacy?: 1 - Never What is the last grade level you completed in school?: GED  Interpreter Needed?: No  Information entered by :: CJohnson, LPN  Past Medical History:  Diagnosis Date  . Arthritis   . Depression    improved on sertraline  . Diabetes mellitus, type 2 ([place]) 09/1996  . GERD (gastroesophageal reflux disease) 1990  . History of peptic ulcer disease   . Hyperlipidemia 1992  . Hypertension 05/2003  . Lumbar stenosis L2-3  . Muscle atrophy    in arms from degenerative changes in neck  . Neck pain    chronic  . Smoker   . Wears glasses    Past Surgical History:  Procedure Laterality Date  . CERVICAL DISC SURGERY  04/19/02   fusion, Dr. Lorin Mercy  . CERVICAL DISCECTOMY  1997   partial  . CERVICAL DISCECTOMY  2000  . COLONOSCOPY WITH ESOPHAGOGASTRODUODENOSCOPY (EGD)  06/25/2015  . [place]  . KNEE ARTHROPLASTY  11/20/2011   Procedure: COMPUTER ASSISTED TOTAL KNEE ARTHROPLASTY;  Surgeon: Marybelle Killings, MD;  Location: [place];  Service: Orthopedics;  Laterality: Left;  Conversion Left Knee Medial Uni to Total Knee Arthroplasty-Cemented  . LUMBAR LAMINECTOMY/DECOMPRESSION MICRODISCECTOMY N/A 04/28/2015   Procedure: L2-3 Decompression;  Surgeon: Thana Farr  Lorin Mercy, MD;  Location: [facility];  Service: Orthopedics;  Laterality: N/A;  . MR MRA DUPLICATE EXAM  INACTIVATE    . MULTIPLE TOOTH EXTRACTIONS    . 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 REPLACEMENT TOTAL KNEE  02/14/02, 05/04/02   left- partial  . SCC EXCISION  04/25/01   Dr. March Rummage  . STOMACH SURGERY  1984   PUD, HH   Family History  Problem Relation Age of Onset  . Diabetes Mother   . Hypertension Mother   . Stroke Mother   . Diabetes Sister    . Diabetes Brother   . Diabetes Brother   . Heart disease Brother   . Diabetes Father   . Breast cancer Maternal Grandmother        breast  . Lung disease Paternal Grandfather        black lung  . Arthritis Neg Hx   . Prostate cancer Neg Hx   . Colon cancer Neg Hx   . Stomach cancer Neg Hx    Social History   Socioeconomic History  . Marital status: Married    Spouse name: Not on file  . Number of children: 3  . Years of education: Not on file  . Highest education level: Not on file  Occupational History  . Occupation: Training and development officer, retired    Comment: Scientific laboratory technician  Social Needs  . Financial resource strain: Not hard at all  . Food insecurity    Worry: Never true    Inability: Never true  . Transportation needs    Medical: No    Non-medical: No  Tobacco Use  . Smoking status: Former Smoker    Packs/day: 1.00    Years: 41.00    Pack years: 41.00    Types: Cigarettes    Quit date: 2014    Years since quitting: 6.8  . Smokeless tobacco: Never Used  Substance and Sexual Activity  . Alcohol use: No    Alcohol/week: 0.0 standard drinks  . Drug use: No  . Sexual activity: Never  Lifestyle  . Physical activity    Days per week: 0 days    Minutes per session: 0 min  . Stress: Not at all  Relationships  . Social Herbalist on phone: Not on file    Gets together: Not on file    Attends religious service: Not on file    Active member of club or organization: Not on file    Attends meetings of clubs or organizations: Not on file    Relationship status: Not on file  Other Topics Concern  . 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 B-D INS SYR MICROFINE 1CC/28G 28G X 1/2" 1 ML MISC USE TO INJECT INSULIN FOUR TIMES A DAY  .  celecoxib (CELEBREX) 200 MG capsule TAKE 1 CAPSULE DAILY  . cholecalciferol (VITAMIN D) 1000 UNITS tablet Take 5,000 Units by mouth 2 (two) times a week.  . Continuous Blood Gluc Sensor (FREESTYLE LIBRE 14 DAY SENSOR) MISC 1 Device by Does not apply route every 14 (fourteen) days.  . cyanocobalamin 1000 MCG tablet Take 1,000 mcg by mouth daily.  . cyclobenzaprine (FLEXERIL) 10 MG tablet TAKE 1 TABLET TWICE A DAY AS  NEEDED FOR MUSCLE SPASMS  . glucose blood (ONE TOUCH ULTRA TEST) test strip Use as instructed to test blood sugar 3 or 4 times daily.  Diagnosis: E11.649   Insulin dependent.  Marland Kitchen glucose blood (PRODIGY AUTOCODE TEST) test strip Use as instructed to test blood sugar 3-4 times per day  . insulin aspart (NOVOLOG FLEXPEN) 100 UNIT/ML FlexPen 70 Units under the skin daily with supper  . Insulin Glargine (LANTUS SOLOSTAR) 100 UNIT/ML Solostar Pen Inject 100 Units into the skin every morning.  . Insulin Pen Needle (SURE COMFORT PEN NEEDLES) 32G X 4 MM MISC Use to inject insulin BID; E11.65  . lisinopril (ZESTRIL) 20 MG tablet TAKE 1 TABLET DAILY  . LUMIGAN 0.01 % SOLN Place 1 drop into both eyes at bedtime.   . metFORMIN (GLUCOPHAGE) 1000 MG tablet TAKE 1 TABLET TWICE A DAY WITH MEALS  . NIASPAN 500 MG CR tablet TAKE 1 TABLET AT BEDTIME  . Omega-3 Fatty Acids (FISH OIL PO) Take 1,000 mg by mouth once a week.  . pravastatin (PRAVACHOL) 80 MG tablet TAKE 1 TABLET DAILY  . traMADol (ULTRAM) 50 MG tablet Take 1 tablet (50 mg total) by mouth every 8 (eight) hours as needed (sedation caution). Chronic joint/back pain   No facility-administered encounter medications on file as of 12/04/2018.     Activities of Daily Living In your present state of health, do you have any difficulty performing the following activities: 12/04/2018  Hearing? N  Vision? N  Difficulty concentrating or making decisions? N  Walking or climbing stairs? N  Dressing or bathing? N  Doing errands, shopping? N  Preparing  Food and eating ? N  Using the Toilet? N  In the past six months, have you accidently leaked urine? N  Do you have problems with loss of bowel control? N  Managing your Medications? N  Managing your Finances? N  Housekeeping or managing your Housekeeping? N  Some recent data might be hidden    Patient Care Team: Tonia Ghent, MD as PCP - General (Family Medicine) Marybelle Killings, MD as Consulting Physician (Orthopedic Surgery) Zehr, Laban Emperor, PA-C as Physician Assistant (Gastroenterology) Gardiner Barefoot, DPM as Consulting Physician (Podiatry) Thelma Comp, [place] as Referring Physician (Optometry) Renato Shin, MD as Consulting Physician (Endocrinology)   Assessment:   This is a routine wellness examination for [place].  Exercise Activities and Dietary recommendations Current Exercise Habits: The patient does not participate in regular exercise at present, Exercise limited by: None identified  Goals    . Increase physical activity     Starting 11/26/2017, I will continue to walk at least 30 min 2 days per week as tolerated.     . Patient Stated     12/04/2018, I will maintain and continue medications as prescribed.        Fall Risk Fall Risk  12/04/2018 11/26/2017 11/23/2016 08/06/2015 06/12/2013  Falls in the past year? 1 No No Yes No  Comment tripped coming in door - - fell while on riding lawn mower -  Number falls in past yr: 0 - - 1 -  Injury with Fall? 0 - - Yes -  Risk Factor Category  - - - High Fall Risk -  Risk for fall due to : Medication side effect - - - -  Follow up Falls evaluation completed;Falls prevention discussed - - Falls evaluation completed -   Is the patient's home free of loose throw rugs in walkways, pet beds, electrical cords, etc?  yes      Grab bars in the bathroom? yes      Handrails on the stairs?   yes      Adequate lighting?   yes  Timed Get Up and Go Performed: N/A  Depression Screen PHQ 2/9 Scores 12/04/2018 11/26/2017 11/23/2016  08/06/2015  PHQ - 2 Score 0 0 0 0  PHQ- 9 Score 0 0 0 -    Cognitive Function MMSE - Mini Mental State Exam 12/04/2018 11/26/2017 11/23/2016 08/06/2015  Orientation to time 5 5 5 5   Orientation to Place 5 5 5 5   Registration 3 3 3 3   Attention/ Calculation 4 0 0 0  Recall 3 3 2 3   Recall-comments - - unable to recall 1 of 3 words -  Language- name 2 objects - 0 0 0  Language- repeat 1 1 1 1   Language- follow 3 step command - 3 3 3   Language- read & follow direction - 0 0 0  Write a sentence - 0 0 0  Copy design - 0 0 0  Total score - 20 19 20   Mini Cog  Mini-Cog screen was completed. Maximum score is 22. A value of 0 denotes this part of the MMSE was not completed or the patient failed this part of the Mini-Cog screening.       Immunization History  Administered Date(s) Administered  . Fluad Quad(high Dose 65+) 10/02/2018  . H1N1 01/30/2008  . Influenza Split 12/08/2010, 10/20/2011  . Influenza Whole 12/03/2008  . Influenza,inj,Quad PF,6+ Mos 12/06/2012, 12/15/2013, 11/17/2014, 11/16/2015, 11/23/2016, 11/26/2017  . Pneumococcal Conjugate-13 11/26/2017  . Pneumococcal Polysaccharide-23 06/12/2013  . Td 09/06/2001  . Tdap 12/07/2011    Qualifies for Shingles Vaccine? Yes  Screening Tests Health Maintenance  Topic Date Due  . PNA vac Low Risk Adult (2 of 2 - PPSV23) 11/27/2018  . HEMOGLOBIN A1C  12/28/2018  . FOOT EXAM  04/25/2019  . OPHTHALMOLOGY EXAM  08/13/2019  . COLONOSCOPY  06/24/2020  . TETANUS/TDAP  12/06/2021  . INFLUENZA VACCINE  Completed  . Hepatitis C Screening  Completed   Cancer Screenings: Lung: Low Dose CT Chest recommended if Age 1-80 years, 30 pack-year currently smoking OR have quit w/in 15years. Patient does not qualify. Colorectal: completed 06/25/2015  Additional Screenings:  Hepatitis C Screening: 02/12/2015      Plan:   Patient wants to maintain and continue medications as prescribed.   I have personally reviewed and noted the following  in the patient's chart:   . Medical and social history . Use of alcohol, tobacco or illicit drugs  . Current medications and supplements . Functional ability and status . Nutritional status . Physical activity . Advanced directives . List of other physicians . Hospitalizations, surgeries, and ER visits in previous 12 months . Vitals . Screenings to include cognitive, depression, and falls . Referrals and appointments  In addition, I have reviewed and discussed with patient certain preventive protocols, quality metrics, and best practice recommendations. A written personalized care plan for preventive services as well as general preventive health recommendations were provided to patient.     Andrez Grime, LPN  [phone]

## 2018-12-06 ENCOUNTER — Other Ambulatory Visit: Payer: Self-pay

## 2018-12-06 ENCOUNTER — Encounter: Payer: Self-pay | Admitting: Family Medicine

## 2018-12-06 ENCOUNTER — Ambulatory Visit (INDEPENDENT_AMBULATORY_CARE_PROVIDER_SITE_OTHER): Payer: Medicare HMO | Admitting: Family Medicine

## 2018-12-06 VITALS — BP 152/80 | HR 89 | Temp 97.2°F | Ht 69.0 in | Wt 213.2 lb

## 2018-12-06 DIAGNOSIS — E782 Mixed hyperlipidemia: Secondary | ICD-10-CM

## 2018-12-06 DIAGNOSIS — Z7189 Other specified counseling: Secondary | ICD-10-CM

## 2018-12-06 DIAGNOSIS — I10 Essential (primary) hypertension: Secondary | ICD-10-CM | POA: Diagnosis not present

## 2018-12-06 DIAGNOSIS — D509 Iron deficiency anemia, unspecified: Secondary | ICD-10-CM | POA: Diagnosis not present

## 2018-12-06 DIAGNOSIS — D649 Anemia, unspecified: Secondary | ICD-10-CM

## 2018-12-06 DIAGNOSIS — Z23 Encounter for immunization: Secondary | ICD-10-CM | POA: Diagnosis not present

## 2018-12-06 DIAGNOSIS — Z0000 Encounter for general adult medical examination without abnormal findings: Secondary | ICD-10-CM

## 2018-12-06 LAB — IBC PANEL
Iron: 22 ug/dL — ABNORMAL LOW (ref 42–165)
Saturation Ratios: 4.4 % — ABNORMAL LOW (ref 20.0–50.0)
Transferrin: 355 mg/dL (ref 212.0–360.0)

## 2018-12-06 NOTE — Progress Notes (Signed)
His brother had covid and is slowly improving, d/w pt.    Diabetes per endo, I'll defer, d/w pt.  He agrees- A1c up, d/w pt.  See avs.  150s in the AM.  D/w pt about diet.    H/o anemia, HGB slightly lower than prev w/o blood seen in stool and colonoscopy up to date.  D/w pt about labs.  WBC and PLT wnl.  See notes on follow-up labs.  Hypertension:    Using medication without problems or lightheadedness: yes Chest pain with exertion:no Edema:no Short of breath:no  CAD noted. With cards eval this year-  Pharmacological myocardial perfusion imaging study with no significant  ischemia Normal wall motion, EF estimated at 46% (mildly depressed likely secondary to GI uptake artifact) No EKG changes concerning for ischemia at peak stress or in recovery. Mild to moderate diffuse aortic atherosclerosis noted on CT images,  At least moderate 2 vessel coronary calcification noted of LAD and RCA Low risk scan  Prev vertigo resolved.  No sx now.   Elevated Cholesterol: Using medications without problems: See below. Muscle aches: see below.   Diet compliance: yes Exercise: walking as much as his hip pain allows.  D/w pt.   Arthritis/muscle pain.  He was having more muscle pain at night, esp lower back pain.  See above.  Unclear how much of this is related to statin.  Normal memory testing today.  He doesn't have red flag memory changes, d/w pt.   Living will d/w pt. Wife designated if patient were incapacitated.  Prostate cancer screening and PSA options(with potential risks and benefits of testing vs not testing) were discussed along with recent recs/guidelines. He declined testing PSAat this point. Colonoscopy 2017 Flu UTD.  Tdap 2013 PNA 2020.   Shingles d/w pt.  He is checking with insurance about coverage.   His CT from this year wasn't covered per patient report and I asked him about appealing that.  I can fill out papers for patient if needed.     No FCNAVD.   PMH and SH  reviewed  Meds, vitals, and allergies reviewed.   ROS: Per HPI unless specifically indicated in ROS section   GEN: nad, alert and oriented HEENT: ncat NECK: supple w/o LA CV: rrr. PULM: ctab, no inc wob ABD: soft, +bs EXT: no edema SKIN: no acute rash

## 2018-12-06 NOTE — Patient Instructions (Addendum)
Please call the endocrine clinic about follow up.   Go to the lab on the way out.  We'll contact you with your lab report. Stop the pravastatin for 1 week and see if the aches get a lot better.  Either way, let me know.  Take care.  Glad to see you.

## 2018-12-08 ENCOUNTER — Telehealth: Payer: Self-pay | Admitting: Family Medicine

## 2018-12-08 DIAGNOSIS — D509 Iron deficiency anemia, unspecified: Secondary | ICD-10-CM

## 2018-12-08 NOTE — Telephone Encounter (Signed)
Call patient.  His iron level is low again.  He had seen GI about this previously and the notes from GI stated that if his iron levels do not remain normal after stopping iron, then please refer him back for further evaluation.  I think it makes sense to see the GI clinic.  I put in a referral.  Thanks.

## 2018-12-08 NOTE — Assessment & Plan Note (Signed)
Living will d/w pt.  Wife designated if patient were incapacitated.   ?

## 2018-12-08 NOTE — Assessment & Plan Note (Signed)
Normal memory testing today.  He doesn't have red flag memory changes, d/w pt.   Living will d/w pt. Wife designated if patient were incapacitated.  Prostate cancer screening and PSA options(with potential risks and benefits of testing vs not testing) were discussed along with recent recs/guidelines. He declined testing PSAat this point. Colonoscopy 2017 Flu UTD.  Tdap 2013 PNA 2020.   Shingles d/w pt.  He is checking with insurance about coverage.   His CT from this year wasn't covered per patient report and I asked him about appealing that.  I can fill out papers for patient if needed.

## 2018-12-08 NOTE — Assessment & Plan Note (Signed)
No change in meds at this point.  If his blood pressure remains elevated we can consider adjusting his medications.  Labs discussed with patient.  He agrees.

## 2018-12-08 NOTE — Assessment & Plan Note (Signed)
Unclear how much of his aches are related to pravastatin.  He will stop it for 1 week and update me either way about his aches.  We will go from there.  I will await his update. >25 minutes spent in face to face time with patient, >50% spent in counselling or coordination of care

## 2018-12-08 NOTE — Assessment & Plan Note (Signed)
See notes on follow-up labs. 

## 2018-12-08 NOTE — Assessment & Plan Note (Signed)
See after visit summary.  I asked him to check with endocrinology.

## 2018-12-09 ENCOUNTER — Telehealth: Payer: Self-pay

## 2018-12-09 DIAGNOSIS — E11649 Type 2 diabetes mellitus with hypoglycemia without coma: Secondary | ICD-10-CM

## 2018-12-09 MED ORDER — NOVOLOG FLEXPEN 100 UNIT/ML ~~LOC~~ SOPN: PEN_INJECTOR | SUBCUTANEOUS | 2 refills | Status: DC

## 2018-12-09 NOTE — Telephone Encounter (Signed)
Patient advised and states they have already made him an appointment with GI.

## 2018-12-09 NOTE — Telephone Encounter (Signed)
RX sent

## 2018-12-09 NOTE — Telephone Encounter (Signed)
MEDICATION: insulin aspart (NOVOLOG FLEXPEN) 100 UNIT/ML FlexPen  PHARMACY:  [facility], [place] A 90 DAY SUPPLY :   IS PATIENT OUT OF MEDICATION:   IF NOT; HOW MUCH IS LEFT:   LAST APPOINTMENT DATE: @5 /28/2020  NEXT APPOINTMENT DATE:@11 /13/2020  DO WE HAVE YOUR PERMISSION TO LEAVE A DETAILED MESSAGE:  OTHER COMMENTS: call patient and advise   **Let patient know to contact pharmacy at the end of the day to make sure medication is ready. **  ** Please notify patient to allow 48-72 hours to process**  **Encourage patient to contact the pharmacy for refills or they can request refills through [facility]**

## 2018-12-10 DIAGNOSIS — H401131 Primary open-angle glaucoma, bilateral, mild stage: Secondary | ICD-10-CM | POA: Diagnosis not present

## 2018-12-10 DIAGNOSIS — H40023 Open angle with borderline findings, high risk, bilateral: Secondary | ICD-10-CM | POA: Diagnosis not present

## 2018-12-10 DIAGNOSIS — H401133 Primary open-angle glaucoma, bilateral, severe stage: Secondary | ICD-10-CM | POA: Diagnosis not present

## 2018-12-11 ENCOUNTER — Other Ambulatory Visit: Payer: Self-pay | Admitting: Family Medicine

## 2018-12-11 ENCOUNTER — Other Ambulatory Visit: Payer: Self-pay

## 2018-12-11 ENCOUNTER — Telehealth: Payer: Self-pay | Admitting: Family Medicine

## 2018-12-11 ENCOUNTER — Encounter: Payer: Self-pay | Admitting: Gastroenterology

## 2018-12-11 ENCOUNTER — Ambulatory Visit (INDEPENDENT_AMBULATORY_CARE_PROVIDER_SITE_OTHER): Payer: Medicare HMO | Admitting: Gastroenterology

## 2018-12-11 VITALS — BP 130/77 | HR 76 | Temp 98.0°F | Ht 70.0 in | Wt 209.6 lb

## 2018-12-11 DIAGNOSIS — D508 Other iron deficiency anemias: Secondary | ICD-10-CM

## 2018-12-11 DIAGNOSIS — K31819 Angiodysplasia of stomach and duodenum without bleeding: Secondary | ICD-10-CM | POA: Diagnosis not present

## 2018-12-11 DIAGNOSIS — D509 Iron deficiency anemia, unspecified: Secondary | ICD-10-CM

## 2018-12-11 NOTE — Patient Instructions (Signed)
If you are age 67 or older, your body mass index should be between 23-30. Your Body mass index is 30.07 kg/m. If this is out of the aforementioned range listed, please consider follow up with your Primary Care Provider.  If you are age 29 or younger, your body mass index should be between 19-25. Your Body mass index is 30.07 kg/m. If this is out of the aformentioned range listed, please consider follow up with your Primary Care Provider.   Please start over the counter slow release Iron 65mg  supplement.   It was a pleasure to see you today!  Dr. Loletha Carrow

## 2018-12-11 NOTE — Telephone Encounter (Signed)
Please set up nonfasting lab visit for about 2 months from now, after patient has been taking iron daily for 2 months.  Thanks.  Labs are ordered.

## 2018-12-11 NOTE — Progress Notes (Signed)
[facility] Consult Note:  History: Anthony Cuevas 12/11/2018  Referring provider: Tonia Ghent, MD  Reason for consult/chief complaint: Anemia (IDA )   Subjective  HPI:  This is a pleasant 68 year old man known to [place] from prior evaluation and referred by primary care for iron deficiency anemia.  Office consult with [place] April 2017 for iron deficiency anemia in the setting of prior gastric surgery.  He had a reported history of "surgery for ulcer", but upper endoscopy only showed evidence of a fundoplication doubt any degree of gastrectomy.  There was a single nonbleeding diminutive gastric AVM as well.  No source seen on colonoscopy, prep fair. I then saw him in telemedicine over this past summer for elevated LFTs and fatty liver.  Recent primary care follow-up noted hemoglobin of 12.4 lower than his previous of 13.6 in February  2019. He denies abdominal pain, dysphagia, odynophagia, nausea, vomiting, anorexia or weight loss.  He denies change in bowel habits, black or bloody stool.  No iron replacement lately, and probably has not been on it for years since hemoglobin and iron levels had normalized.  ROS:  Review of Systems  Constitutional: Negative for appetite change and unexpected weight change.  HENT: Negative for mouth sores and voice change.   Eyes: Negative for pain and redness.  Respiratory: Negative for cough and shortness of breath.   Cardiovascular: Negative for chest pain and palpitations.  Genitourinary: Negative for dysuria and hematuria.  Musculoskeletal: Positive for arthralgias and neck pain. Negative for myalgias.  Skin: Negative for pallor and rash.  Neurological: Negative for weakness and headaches.  Hematological: Negative for adenopathy.     Past Medical History: Past Medical History:  Diagnosis Date  . Arthritis   . Depression    improved on sertraline  . Diabetes mellitus, type 2 ([place]) 09/1996  . GERD  (gastroesophageal reflux disease) 1990  . History of peptic ulcer disease   . Hyperlipidemia 1992  . Hypertension 05/2003  . Lumbar stenosis L2-3  . Muscle atrophy    in arms from degenerative changes in neck  . Neck pain    chronic  . Smoker   . Wears glasses      Past Surgical History: Past Surgical History:  Procedure Laterality Date  . CERVICAL DISC SURGERY  04/19/02   fusion, Dr. Lorin Mercy  . CERVICAL DISCECTOMY  1997   partial  . CERVICAL DISCECTOMY  2000  . COLONOSCOPY WITH ESOPHAGOGASTRODUODENOSCOPY (EGD)  06/25/2015  . [place]  . KNEE ARTHROPLASTY  11/20/2011   Procedure: COMPUTER ASSISTED TOTAL KNEE ARTHROPLASTY;  Surgeon: Marybelle Killings, MD;  Location: [facility];  Service: Orthopedics;  Laterality: Left;  Conversion Left Knee Medial Uni to Total Knee Arthroplasty-Cemented  . LUMBAR LAMINECTOMY/DECOMPRESSION MICRODISCECTOMY N/A 04/28/2015   Procedure: L2-3 Decompression;  Surgeon: Marybelle Killings, MD;  Location: [facility];  Service: Orthopedics;  Laterality: N/A;  . MR MRA DUPLICATE EXAM  INACTIVATE    . MULTIPLE TOOTH EXTRACTIONS    . 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 REPLACEMENT TOTAL KNEE  02/14/02, 05/04/02   left- partial  . SCC EXCISION  04/25/01   Dr. March Rummage  . STOMACH SURGERY  1984   PUD, [facility]     Family History: Family History  Problem Relation Age of Onset  . Diabetes Mother   . Hypertension Mother   . Stroke Mother   . Diabetes Sister   . Diabetes Brother   . Diabetes Brother   .  Heart disease Brother   . Diabetes Father   . Breast cancer Maternal Grandmother        breast  . Lung disease Paternal Grandfather        black lung  . Arthritis Neg Hx   . Prostate cancer Neg Hx   . Colon cancer Neg Hx   . Stomach cancer Neg Hx     Social History: Social History   Socioeconomic History  . Marital status: Married    Spouse name: Not on file  . Number of children: 3  . Years of education: Not on file  . Highest education level: Not on file   Occupational History  . Occupation: Training and development officer, retired    Comment: Scientific laboratory technician  Social Needs  . Financial resource strain: Not hard at all  . Food insecurity    Worry: Never true    Inability: Never true  . Transportation needs    Medical: No    Non-medical: No  Tobacco Use  . Smoking status: Former Smoker    Packs/day: 1.00    Years: 41.00    Pack years: 41.00    Types: Cigarettes    Quit date: 2014    Years since quitting: 6.8  . Smokeless tobacco: Never Used  Substance and Sexual Activity  . Alcohol use: No    Alcohol/week: 0.0 standard drinks  . Drug use: No  . Sexual activity: Never  Lifestyle  . Physical activity    Days per week: 0 days    Minutes per session: 0 min  . Stress: Not at all  Relationships  . Social Herbalist on phone: Not on file    Gets together: Not on file    Attends religious service: Not on file    Active member of club or organization: Not on file    Attends meetings of clubs or organizations: Not on file    Relationship status: Not on file  Other Topics Concern  . 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 B-D INS SYR MICROFINE 1CC/28G 28G X 1/2" 1 ML MISC USE TO INJECT INSULIN FOUR TIMES A DAY 400 each 2  . cholecalciferol (VITAMIN D) 1000 UNITS tablet Take 5,000 Units by mouth 2 (two) times a week.    . Continuous Blood Gluc Sensor (FREESTYLE LIBRE 14 DAY SENSOR) MISC 1 Device by Does not apply route every 14 (fourteen) days. 6 each 3  . cyanocobalamin 1000 MCG tablet Take 1,000 mcg by mouth daily.    . cyclobenzaprine (FLEXERIL) 10 MG tablet TAKE 1 TABLET TWICE A DAY AS NEEDED FOR MUSCLE SPASMS 180 tablet 1  . glucose blood (ONE TOUCH ULTRA  TEST) test strip Use as instructed to test blood sugar 3 or 4 times daily.  Diagnosis: E11.649   Insulin dependent. 100 each 3  . glucose blood (PRODIGY AUTOCODE TEST) test strip Use as instructed to test blood sugar 3-4 times per day    . insulin aspart (NOVOLOG FLEXPEN) 100 UNIT/ML FlexPen 70 Units under the skin daily with supper 15 mL 2  . Insulin Glargine (LANTUS SOLOSTAR) 100 UNIT/ML Solostar Pen Inject 100 Units into the skin every morning. 45 pen  PRN  . Insulin Pen Needle (SURE COMFORT PEN NEEDLES) 32G X 4 MM MISC Use to inject insulin BID; E11.65 100 each 2  . lisinopril (ZESTRIL) 20 MG tablet TAKE 1 TABLET DAILY 90 tablet 3  . LUMIGAN 0.01 % SOLN Place 1 drop into both eyes at bedtime.     . metFORMIN (GLUCOPHAGE) 1000 MG tablet TAKE 1 TABLET TWICE A DAY WITH MEALS 180 tablet 3  . NIASPAN 500 MG CR tablet TAKE 1 TABLET AT BEDTIME 90 tablet 4  . Omega-3 Fatty Acids (FISH OIL PO) Take 1,000 mg by mouth once a week.    . traMADol (ULTRAM) 50 MG tablet Take 1 tablet (50 mg total) by mouth every 8 (eight) hours as needed (sedation caution). Chronic joint/back pain 270 tablet 0  . celecoxib (CELEBREX) 200 MG capsule TAKE 1 CAPSULE DAILY 90 capsule 3  . pravastatin (PRAVACHOL) 80 MG tablet TAKE 1 TABLET DAILY (Patient not taking: Reported on 12/11/2018) 90 tablet 3   No current facility-administered medications for this visit.       ___________________________________________________________________ Objective   Exam:  BP 130/77   Pulse 76   Temp 98 F (36.7 C)   Ht 5\' 10"  (1.778 m)   Wt 209 lb 9.6 oz (95.1 kg)   SpO2 98%   BMI 30.07 kg/m    General: Well-appearing  Eyes: sclera anicteric, no redness  ENT: oral mucosa moist without lesions, no cervical or supraclavicular lymphadenopathy.  Scar from cervical surgery and posterior neck soft tissue lesion removal  CV: RRR without murmur, S1/S2, no JVD, no peripheral edema  Resp: clear to auscultation bilaterally, normal RR  and effort noted  GI: soft, no tenderness, with active bowel sounds.  Liver edge felt on inspiration.  No bulging flanks or distention to suggest ascites.  Skin; warm and dry, no rash or jaundice noted  Neuro: awake, alert and oriented x 3. Normal gross motor function and fluent speech.  No asterixis, gait steady  Labs:  CBC Latest Ref Rng & Units 12/04/2018 03/02/2017 11/23/2016  WBC 4.0 - 10.5 K/uL 7.6 7.2 8.9  Hemoglobin 13.0 - 17.0 g/dL 12.4(L) 13.6 14.7  Hematocrit 39.0 - 52.0 % 37.5(L) 41.6 44.2  Platelets 150.0 - 400.0 K/uL 196.0 262.0 296.0    12/06/2018, iron 22, 4% saturation In February 2019, iron 88, 17% saturation October 2018 iron 62, 18% saturation   Assessment: Encounter Diagnoses  Name Primary?  . Other iron deficiency anemia Yes  . Gastric AVM     Recurrence of iron deficiency anemia to a lesser degree than when we had seen him a few years back.  Seems most likely either distal small bowel ulceration from Celebrex use or AVM.  Plan:  Recommend once daily slow release iron for the next 6 to 8 weeks, then recheck hemoglobin and iron studies with primary care.  If he has persistent iron deficiency anemia not responding to iron replacement, probable small bowel capsule study.  Thank you for the courtesy of this consult.  Please call me with any questions or concerns.  Nelida Meuse III  CC: Referring provider noted above

## 2018-12-11 NOTE — Telephone Encounter (Signed)
Sent. Thanks.  Let me know if stopping the pravastatin help with his muscle aches.  Thanks.

## 2018-12-11 NOTE — Telephone Encounter (Signed)
Last refilled on 09/18/2017 #90 with 4 refills to [facility]. LOV 12/06/2018 follow up  No future appointments scheduled.

## 2018-12-12 NOTE — Telephone Encounter (Signed)
Patient says his muscle aches have gotten some better but has only been off of it 3 days.

## 2018-12-13 ENCOUNTER — Ambulatory Visit (INDEPENDENT_AMBULATORY_CARE_PROVIDER_SITE_OTHER): Payer: Medicare HMO | Admitting: Endocrinology

## 2018-12-13 ENCOUNTER — Encounter: Payer: Self-pay | Admitting: Endocrinology

## 2018-12-13 ENCOUNTER — Other Ambulatory Visit: Payer: Self-pay

## 2018-12-13 DIAGNOSIS — E11649 Type 2 diabetes mellitus with hypoglycemia without coma: Secondary | ICD-10-CM | POA: Diagnosis not present

## 2018-12-13 MED ORDER — LANTUS SOLOSTAR 100 UNIT/ML ~~LOC~~ SOPN: 90.0000 [IU] | PEN_INJECTOR | SUBCUTANEOUS | 99 refills | Status: DC

## 2018-12-13 NOTE — Telephone Encounter (Signed)
Then I would continue off the pravastatin and have him update [place] next week.  Thanks.

## 2018-12-13 NOTE — Patient Instructions (Addendum)
check your blood sugar 4 times a day.  vary the time of day when you check, between before the 3 meals, and at bedtime.  also check if you have symptoms of your blood sugar being too high or too low.  please keep a record of the readings and bring it to your next appointment here (or you can bring the meter itself).  You can write it on any piece of paper.  please call us sooner if your blood sugar goes below 70, or if you have a lot of readings over 200.   For now, please reduce the Lantus to 90 units morning, and: Please continue the same Novolog Please come back for a follow-up appointment in 2 months.  When you run out of Lantus, our plan will be to change both insulins to just NPH, each morning.

## 2018-12-13 NOTE — Progress Notes (Signed)
Subjective:    Patient ID: Anthony Cuevas, male    DOB: 04-14-1950, 68 y.o.   MRN: [id number]  HPI Pt returns for f/u of diabetes mellitus: DM type: Insulin-requiring type 2 Dx'ed: 0000000 Complications: none Therapy: insulin since 2006, and metformin.   DKA: never Severe hypoglycemia: never.  Pancreatitis: never Pancreatic imaging: never Other: he stopped pioglitizone, due to edema; he takes 2 QD insulins, after poor results with multiple daily injections.   Interval history: I reviewed continuous glucose monitor data.  glucose varies from 40-360.  It is in general highest at 8 PM, and lowest fasting.  Plan is to change both insulins to NPH qam.  However, he has 25 lantus pens he wants to use up first.   Past Medical History:  Diagnosis Date  . Arthritis   . Depression    improved on sertraline  . Diabetes mellitus, type 2 ([place]) 09/1996  . GERD (gastroesophageal reflux disease) 1990  . History of peptic ulcer disease   . Hyperlipidemia 1992  . Hypertension 05/2003  . Lumbar stenosis L2-3  . Muscle atrophy    in arms from degenerative changes in neck  . Neck pain    chronic  . Smoker   . Wears glasses     Past Surgical History:  Procedure Laterality Date  . CERVICAL DISC SURGERY  04/19/02   fusion, Dr. Lorin Mercy  . CERVICAL DISCECTOMY  1997   partial  . CERVICAL DISCECTOMY  2000  . COLONOSCOPY WITH ESOPHAGOGASTRODUODENOSCOPY (EGD)  06/25/2015  . [place]  . KNEE ARTHROPLASTY  11/20/2011   Procedure: COMPUTER ASSISTED TOTAL KNEE ARTHROPLASTY;  Surgeon: Marybelle Killings, MD;  Location: [facility];  Service: Orthopedics;  Laterality: Left;  Conversion Left Knee Medial Uni to Total Knee Arthroplasty-Cemented  . LUMBAR LAMINECTOMY/DECOMPRESSION MICRODISCECTOMY N/A 04/28/2015   Procedure: L2-3 Decompression;  Surgeon: Marybelle Killings, MD;  Location: [facility];  Service: Orthopedics;  Laterality: N/A;  . MR MRA DUPLICATE EXAM  INACTIVATE    . MULTIPLE TOOTH EXTRACTIONS    . Neisen  funduplication  0000000  . REPLACEMENT TOTAL KNEE  02/14/02, 05/04/02   left- partial  . SCC EXCISION  04/25/01   Dr. March Rummage  . STOMACH SURGERY  1984   PUD, [place]    Social History   Socioeconomic History  . Marital status: Married    Spouse name: Not on file  . Number of children: 3  . Years of education: Not on file  . Highest education level: Not on file  Occupational History  . Occupation: Training and development officer, retired    Comment: Scientific laboratory technician  Social Needs  . Financial resource strain: Not hard at all  . Food insecurity    Worry: Never true    Inability: Never true  . Transportation needs    Medical: No    Non-medical: No  Tobacco Use  . Smoking status: Former Smoker    Packs/day: 1.00    Years: 41.00    Pack years: 41.00    Types: Cigarettes    Quit date: 2014    Years since quitting: 6.8  . Smokeless tobacco: Never Used  Substance and Sexual Activity  . Alcohol use: No    Alcohol/week: 0.0 standard drinks  . Drug use: No  . Sexual activity: Never  Lifestyle  . Physical activity    Days per week: 0 days    Minutes per session: 0 min  . Stress: Not at all  Relationships  .  Social Herbalist on phone: Not on file    Gets together: Not on file    Attends religious service: Not on file    Active member of club or organization: Not on file    Attends meetings of clubs or organizations: Not on file    Relationship status: Not on file  . Intimate partner violence    Fear of current or ex partner: No    Emotionally abused: No    Physically abused: No    Forced sexual activity: No  Other Topics Concern  . 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 celecoxib (CELEBREX) 200 MG capsule TAKE 1 CAPSULE DAILY 90 capsule  3  . cholecalciferol (VITAMIN D) 1000 UNITS tablet Take 5,000 Units by mouth 2 (two) times a week.    . Continuous Blood Gluc Sensor (FREESTYLE LIBRE 14 DAY SENSOR) MISC 1 Device by Does not apply route every 14 (fourteen) days. 6 each 3  . cyanocobalamin 1000 MCG tablet Take 1,000 mcg by mouth daily.    . cyclobenzaprine (FLEXERIL) 10 MG tablet TAKE 1 TABLET TWICE A DAY AS NEEDED FOR MUSCLE SPASMS 180 tablet 1  . insulin aspart (NOVOLOG FLEXPEN) 100 UNIT/ML FlexPen 70 Units under the skin daily with supper 15 mL 2  . Insulin Pen Needle (SURE COMFORT PEN NEEDLES) 32G X 4 MM MISC Use to inject insulin BID; E11.65 100 each 2  . lisinopril (ZESTRIL) 20 MG tablet TAKE 1 TABLET DAILY 90 tablet 3  . LUMIGAN 0.01 % SOLN Place 1 drop into both eyes at bedtime.     . metFORMIN (GLUCOPHAGE) 1000 MG tablet TAKE 1 TABLET TWICE A DAY WITH MEALS 180 tablet 3  . NIASPAN 500 MG CR tablet TAKE 1 TABLET AT BEDTIME 90 tablet 4  . Omega-3 Fatty Acids (FISH OIL PO) Take 1,000 mg by mouth once a week.    . pravastatin (PRAVACHOL) 80 MG tablet TAKE 1 TABLET DAILY 90 tablet 3  . traMADol (ULTRAM) 50 MG tablet Take 1 tablet (50 mg total) by mouth every 8 (eight) hours as needed (sedation caution). Chronic joint/back pain 270 tablet 0   No current facility-administered medications on file prior to visit.     No Known Allergies  Family History  Problem Relation Age of Onset  . Diabetes Mother   . Hypertension Mother   . Stroke Mother   . Diabetes Sister   . Diabetes Brother   . Diabetes Brother   . Heart disease Brother   . Diabetes Father   . Breast cancer Maternal Grandmother        breast  . Lung disease Paternal Grandfather        black lung  . Arthritis Neg Hx   . Prostate cancer Neg Hx   . Colon cancer Neg Hx   . Stomach cancer Neg Hx     BP 126/60 (BP Location: Right Arm, Patient Position: Sitting, Cuff Size: Large)   Pulse (!) 105   Ht 5\' 10"  (1.778 m)   Wt 211 lb 3.2 oz (95.8 kg)   SpO2 98%    BMI 30.30 kg/m    Review of Systems Denies LOC.  Objective:   Physical Exam VITAL SIGNS:  See vs page GENERAL: no distress Pulses: dorsalis pedis intact bilat.   MSK: no deformity of the feet CV: no leg edema Skin:  no ulcer on the feet.  normal color and temp on the feet.  Neuro: sensation is intact to touch on the feet.    Lab Results  Component Value Date   HGBA1C 8.0 (H) 12/04/2018   Lab Results  Component Value Date   CREATININE 1.04 12/04/2018   BUN 18 12/04/2018   NA 139 12/04/2018   K 4.7 12/04/2018   CL 101 12/04/2018   CO2 26 12/04/2018       Assessment & Plan:  Insulin-requiring type 2 DM: he needs increased rx.  hypoglycemia, worse. We have to address this before we can address the high A1c.   Patient Instructions  check your blood sugar 4 times a day.  vary the time of day when you check, between before the 3 meals, and at bedtime.  also check if you have symptoms of your blood sugar being too high or too low.  please keep a record of the readings and bring it to your next appointment here (or you can bring the meter itself).  You can write it on any piece of paper.  please call us sooner if your blood sugar goes below 70, or if you have a lot of readings over 200.   For now, please reduce the Lantus to 90 units morning, and: Please continue the same Novolog Please come back for a follow-up appointment in 2 months.  When you run out of Lantus, our plan will be to change both insulins to just NPH, each morning.

## 2018-12-24 ENCOUNTER — Ambulatory Visit (INDEPENDENT_AMBULATORY_CARE_PROVIDER_SITE_OTHER): Payer: Medicare HMO | Admitting: Podiatry

## 2018-12-24 ENCOUNTER — Other Ambulatory Visit: Payer: Self-pay | Admitting: Family Medicine

## 2018-12-24 ENCOUNTER — Other Ambulatory Visit: Payer: Self-pay

## 2018-12-24 ENCOUNTER — Encounter: Payer: Self-pay | Admitting: Podiatry

## 2018-12-24 DIAGNOSIS — M79676 Pain in unspecified toe(s): Secondary | ICD-10-CM | POA: Diagnosis not present

## 2018-12-24 DIAGNOSIS — E119 Type 2 diabetes mellitus without complications: Secondary | ICD-10-CM

## 2018-12-24 DIAGNOSIS — B351 Tinea unguium: Secondary | ICD-10-CM

## 2018-12-24 NOTE — Telephone Encounter (Signed)
Electronic refill request. Cyclobenzaprine Last office visit:   12/06/2018 Last Filled:    180 tablet 1 07/09/2018  Please advise.

## 2018-12-24 NOTE — Progress Notes (Signed)
Patient ID: Anthony Cuevas, male   DOB: 1950/06/21, 68 y.o.   MRN: [id number] Complaint:  Visit Type: Patient returns to my office for continued preventative foot care services. Complaint: Patient states" my nails have grown long and thick and become painful to walk and wear shoes" Patient has been diagnosed with DM with no foot complications. The patient presents for preventative foot care services. No changes to ROS.        Podiatric Exam: Vascular: dorsalis pedis and posterior tibial pulses are palpable bilateral. Capillary return is immediate. Temperature gradient is WNL. Skin turgor WNL  Sensorium: Diminished  Semmes Weinstein monofilament test. Normal tactile sensation bilaterally. Nail Exam: Pt has thick disfigured discolored nails with subungual debris noted bilateral entire nail hallux through fifth toenails.  The hallux toenail is unattached right nail bed except at proximal nail fold. No redness or swelling noted. Ulcer Exam: There is no evidence of ulcer or pre-ulcerative changes or infection. Orthopedic Exam: Muscle tone and strength are WNL. No limitations in general ROM. No crepitus or effusions noted. Foot type and digits show no abnormalities. Bony prominences are unremarkable.HAV B/L Skin: No Porokeratosis. 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 The patient understood the discussion of treatment and procedures well. All questions were answered thoroughly reviewed. Debridement of mycotic and hypertrophic toenails, 1 through 5 bilateral and clearing of subungual debris. No ulceration, no infection noted.   Return Visit-Office Procedure: Patient instructed to return to the office for a follow up visit 3 months for continued evaluation and treatment.    Gardiner Barefoot DPM

## 2018-12-25 NOTE — Telephone Encounter (Signed)
Sent. Thanks.   

## 2018-12-30 DIAGNOSIS — E119 Type 2 diabetes mellitus without complications: Secondary | ICD-10-CM | POA: Diagnosis not present

## 2019-01-29 DIAGNOSIS — E119 Type 2 diabetes mellitus without complications: Secondary | ICD-10-CM | POA: Diagnosis not present

## 2019-02-11 ENCOUNTER — Other Ambulatory Visit (INDEPENDENT_AMBULATORY_CARE_PROVIDER_SITE_OTHER): Payer: Medicare HMO

## 2019-02-11 DIAGNOSIS — D509 Iron deficiency anemia, unspecified: Secondary | ICD-10-CM

## 2019-02-11 LAB — CBC WITH DIFFERENTIAL/PLATELET
Basophils Absolute: 0 10*3/uL (ref 0.0–0.1)
Basophils Relative: 0.8 % (ref 0.0–3.0)
Eosinophils Absolute: 0.2 10*3/uL (ref 0.0–0.7)
Eosinophils Relative: 2.5 % (ref 0.0–5.0)
HCT: 39 % (ref 39.0–52.0)
Hemoglobin: 13.3 g/dL (ref 13.0–17.0)
Lymphocytes Relative: 20.8 % (ref 12.0–46.0)
Lymphs Abs: 1.3 10*3/uL (ref 0.7–4.0)
MCHC: 34.1 g/dL (ref 30.0–36.0)
MCV: 94.5 fl (ref 78.0–100.0)
Monocytes Absolute: 0.7 10*3/uL (ref 0.1–1.0)
Monocytes Relative: 11.6 % (ref 3.0–12.0)
Neutro Abs: 4.1 10*3/uL (ref 1.4–7.7)
Neutrophils Relative %: 64.3 % (ref 43.0–77.0)
Platelets: 194 10*3/uL (ref 150.0–400.0)
RBC: 4.13 Mil/uL — ABNORMAL LOW (ref 4.22–5.81)
RDW: 15.2 % (ref 11.5–15.5)
WBC: 6.3 10*3/uL (ref 4.0–10.5)

## 2019-02-11 LAB — IBC PANEL
Iron: 73 ug/dL (ref 42–165)
Saturation Ratios: 17.2 % — ABNORMAL LOW (ref 20.0–50.0)
Transferrin: 303 mg/dL (ref 212.0–360.0)

## 2019-02-12 ENCOUNTER — Other Ambulatory Visit: Payer: Self-pay | Admitting: Family Medicine

## 2019-02-12 DIAGNOSIS — D509 Iron deficiency anemia, unspecified: Secondary | ICD-10-CM

## 2019-02-13 ENCOUNTER — Telehealth: Payer: Self-pay

## 2019-02-13 NOTE — Telephone Encounter (Signed)
Last office visit 12/16/2018 for CPE.  Last refilled 11/09/2018 for #270 with no refills.  No future appointments.

## 2019-02-13 NOTE — Telephone Encounter (Signed)
Called patient and gave lab results and he understood, I also scheduled a lab a[ppt. Inn 3 months with the patient.  Nyree Applegate,cma

## 2019-02-14 NOTE — Telephone Encounter (Signed)
Sent. Thanks.   

## 2019-02-19 ENCOUNTER — Other Ambulatory Visit: Payer: Self-pay

## 2019-02-21 ENCOUNTER — Ambulatory Visit (INDEPENDENT_AMBULATORY_CARE_PROVIDER_SITE_OTHER): Payer: Medicare HMO | Admitting: Endocrinology

## 2019-02-21 ENCOUNTER — Encounter: Payer: Self-pay | Admitting: Endocrinology

## 2019-02-21 ENCOUNTER — Other Ambulatory Visit: Payer: Self-pay

## 2019-02-21 VITALS — BP 138/70 | HR 100 | Ht 70.0 in | Wt 213.0 lb

## 2019-02-21 DIAGNOSIS — E11649 Type 2 diabetes mellitus with hypoglycemia without coma: Secondary | ICD-10-CM

## 2019-02-21 LAB — POCT GLYCOSYLATED HEMOGLOBIN (HGB A1C): Hemoglobin A1C: 7.3 % — AB (ref 4.0–5.6)

## 2019-02-21 MED ORDER — NOVOLIN N FLEXPEN 100 UNIT/ML ~~LOC~~ SUPN: 140.0000 [IU] | PEN_INJECTOR | SUBCUTANEOUS | 3 refills | Status: DC

## 2019-02-21 NOTE — Patient Instructions (Addendum)
check your blood sugar 4 times a day.  vary the time of day when you check, between before the 3 meals, and at bedtime.  also check if you have symptoms of your blood sugar being too high or too low.  please keep a record of the readings and bring it to your next appointment here (or you can bring the meter itself).  You can write it on any piece of paper.  please call us sooner if your blood sugar goes below 70, or if you have a lot of readings over 200.   I have sent a prescription to your pharmacy, to change both current insulins to "NPH," 140 units each morning Please come back for a follow-up appointment in 2 months.

## 2019-02-21 NOTE — Progress Notes (Signed)
Subjective:    Patient ID: Anthony Cuevas, male    DOB: 1950/08/04, 69 y.o.   MRN: [id number]  HPI Pt returns for f/u of diabetes mellitus: DM type: Insulin-requiring type 2 Dx'ed: 0000000 Complications: none Therapy: insulin since 2006, and metformin.   DKA: never Severe hypoglycemia: never.  Pancreatitis: never Pancreatic imaging: never Other: he stopped pioglitizone, due to edema; he takes QD insulin, after poor results with multiple daily injections.   Interval history: I reviewed continuous glucose monitor data.  glucose varies from 40-400.  It is in general highest at 11 PM, and lowest fasting.  He will be out of insulin soon.  Past Medical History:  Diagnosis Date  . Arthritis   . Depression    improved on sertraline  . Diabetes mellitus, type 2 ([place]) 09/1996  . GERD (gastroesophageal reflux disease) 1990  . History of peptic ulcer disease   . Hyperlipidemia 1992  . Hypertension 05/2003  . Lumbar stenosis L2-3  . Muscle atrophy    in arms from degenerative changes in neck  . Neck pain    chronic  . Smoker   . Wears glasses     Past Surgical History:  Procedure Laterality Date  . CERVICAL DISC SURGERY  04/19/02   fusion, Dr. Lorin Mercy  . CERVICAL DISCECTOMY  1997   partial  . CERVICAL DISCECTOMY  2000  . COLONOSCOPY WITH ESOPHAGOGASTRODUODENOSCOPY (EGD)  06/25/2015  . [place]  . KNEE ARTHROPLASTY  11/20/2011   Procedure: COMPUTER ASSISTED TOTAL KNEE ARTHROPLASTY;  Surgeon: Marybelle Killings, MD;  Location: [facility];  Service: Orthopedics;  Laterality: Left;  Conversion Left Knee Medial Uni to Total Knee Arthroplasty-Cemented  . LUMBAR LAMINECTOMY/DECOMPRESSION MICRODISCECTOMY N/A 04/28/2015   Procedure: L2-3 Decompression;  Surgeon: Marybelle Killings, MD;  Location: [facility];  Service: Orthopedics;  Laterality: N/A;  . MR MRA DUPLICATE EXAM  INACTIVATE    . MULTIPLE TOOTH EXTRACTIONS    . 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 REPLACEMENT TOTAL KNEE  02/14/02, 05/04/02   left- partial  . SCC EXCISION  04/25/01   Dr. March Rummage  . STOMACH SURGERY  1984   PUD, [place]    Social History   Socioeconomic History  . Marital status: Married    Spouse name: Not on file  . Number of children: 3  . Years of education: Not on file  . Highest education level: Not on file  Occupational History  . Occupation: Training and development officer, retired    Comment: Scientific laboratory technician  Tobacco Use  . Smoking status: Former Smoker    Packs/day: 1.00    Years: 41.00    Pack years: 41.00    Types: Cigarettes    Quit date: 2014    Years since quitting: 7.0  . Smokeless tobacco: Never Used  Substance and Sexual Activity  . Alcohol use: No    Alcohol/week: 0.0 standard drinks  . Drug use: No  . Sexual activity: Never  Other Topics Concern  . 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 Difficulty of Paying Living Expenses: Not hard at all  Food Insecurity: No Food Insecurity  . Worried About Charity fundraiser in the Last Year: Never true  .  Ran Out of Food in the Last Year: Never true  Transportation Needs: No Transportation Needs  . Lack of Transportation (Medical): No  . Lack of Transportation (Non-Medical): No  Physical Activity: Inactive  . Days of Exercise per Week: 0 days  . Minutes of Exercise per Session: 0 min  Stress: No Stress Concern Present  . Feeling of Stress : Not at all  Social Connections:   . Frequency of Communication with Friends and Family: Not on file  . Frequency of Social Gatherings with Friends and Family: Not on file  . Attends Religious Services: Not on file  . Active Member of Clubs or Organizations: Not on file  . Attends Archivist Meetings: Not on file  . Marital Status: Not on file  Intimate Partner Violence: Not  At Risk  . Fear of Current or Ex-Partner: No  . Emotionally Abused: No  . Physically Abused: No  . Sexually Abused: No    Current Outpatient Medications on File Prior to Visit  Medication Sig Dispense Refill  . celecoxib (CELEBREX) 200 MG capsule TAKE 1 CAPSULE DAILY 90 capsule 3  . cholecalciferol (VITAMIN D) 1000 UNITS tablet Take 5,000 Units by mouth 2 (two) times a week.    . Continuous Blood Gluc Sensor (FREESTYLE LIBRE 14 DAY SENSOR) MISC 1 Device by Does not apply route every 14 (fourteen) days. 6 each 3  . cyanocobalamin 1000 MCG tablet Take 1,000 mcg by mouth daily.    . cyclobenzaprine (FLEXERIL) 10 MG tablet TAKE 1 TABLET TWICE A DAY AS NEEDED FOR MUSCLE SPASMS.  Sedation caution. 180 tablet 1  . Insulin Pen Needle (SURE COMFORT PEN NEEDLES) 32G X 4 MM MISC Use to inject insulin BID; E11.65 100 each 2  . lisinopril (ZESTRIL) 20 MG tablet TAKE 1 TABLET DAILY 90 tablet 3  . LUMIGAN 0.01 % SOLN Place 1 drop into both eyes at bedtime.     . metFORMIN (GLUCOPHAGE) 1000 MG tablet TAKE 1 TABLET TWICE A DAY WITH MEALS 180 tablet 3  . NIASPAN 500 MG CR tablet TAKE 1 TABLET AT BEDTIME 90 tablet 4  . Omega-3 Fatty Acids (FISH OIL PO) Take 1,000 mg by mouth once a week.    . pravastatin (PRAVACHOL) 80 MG tablet TAKE 1 TABLET DAILY 90 tablet 3  . traMADol (ULTRAM) 50 MG tablet TAKE 1 TABLET EVERY 8 HOURS AS NEEDED FOR CHRONIC JOINT/BACK PAIN (SEDATION CAUTION) 270 tablet 0   No current facility-administered medications on file prior to visit.    No Known Allergies  Family History  Problem Relation Age of Onset  . Diabetes Mother   . Hypertension Mother   . Stroke Mother   . Diabetes Sister   . Diabetes Brother   . Diabetes Brother   . Heart disease Brother   . Diabetes Father   . Breast cancer Maternal Grandmother        breast  . Lung disease Paternal Grandfather        black lung  . Arthritis Neg Hx   . Prostate cancer Neg Hx   . Colon cancer Neg Hx   . Stomach cancer Neg  Hx     BP 138/70 (BP Location: Right Arm, Patient Position: Sitting, Cuff Size: Large)   Pulse 100   Ht 5\' 10"  (1.778 m)   Wt 213 lb (96.6 kg)   SpO2 94%   BMI 30.56 kg/m    Review of Systems Denies LOC    Objective:  Physical Exam VITAL SIGNS:  See vs page GENERAL: no distress Pulses: dorsalis pedis intact bilat.   MSK: no deformity of the feet CV: no leg edema Skin:  no ulcer on the feet, but the skin is dry.  normal color and temp on the feet. Neuro: sensation is intact to touch on the feet  Lab Results  Component Value Date   HGBA1C 7.3 (A) 02/21/2019   Lab Results  Component Value Date   CREATININE 1.04 12/04/2018   BUN 18 12/04/2018   NA 139 12/04/2018   K 4.7 12/04/2018   CL 101 12/04/2018   CO2 26 12/04/2018       Assessment & Plan:  Insulin-requiring type 2 DM: according to cbg's, he needs a faster-acting qam insulin hypoglycemia: in this setting, we'll start at just 140 units of NPH each AM.   Patient Instructions  check your blood sugar 4 times a day.  vary the time of day when you check, between before the 3 meals, and at bedtime.  also check if you have symptoms of your blood sugar being too high or too low.  please keep a record of the readings and bring it to your next appointment here (or you can bring the meter itself).  You can write it on any piece of paper.  please call us sooner if your blood sugar goes below 70, or if you have a lot of readings over 200.   I have sent a prescription to your pharmacy, to change both current insulins to "NPH," 140 units each morning Please come back for a follow-up appointment in 2 months.

## 2019-02-28 DIAGNOSIS — E119 Type 2 diabetes mellitus without complications: Secondary | ICD-10-CM | POA: Diagnosis not present

## 2019-03-07 ENCOUNTER — Telehealth: Payer: Self-pay | Admitting: Endocrinology

## 2019-03-07 NOTE — Telephone Encounter (Signed)
Called pt and gave him MD message on dosage changes. Pt verbalized understanding.

## 2019-03-07 NOTE — Telephone Encounter (Signed)
  Date Time Reading Notes       02/05  162   02/05  167   02/05  213   02/04  285   02/04  277   02/03  226   02/03  214   02/02  231   02/02  170   02/02  293 Just woke up  02/01/  348    Time on meter is incorrect. For this reason, the times are not listed. Taking 140 units of Novolin in the morning. Pt states that it is "leveling off" Reports it is 158 at time of phone call and coming down.  Please advise

## 2019-03-07 NOTE — Telephone Encounter (Signed)
Please increase the NPH to 160 units each AM.

## 2019-03-07 NOTE — Telephone Encounter (Signed)
Patient requests to be called at ph# [phone] to give his blood sugar readings-per Dr. Cordelia Pen request. Patient states blood sugars have been running high (highest = 293 on 03/04/19)

## 2019-03-11 ENCOUNTER — Other Ambulatory Visit: Payer: Self-pay | Admitting: Family Medicine

## 2019-03-25 ENCOUNTER — Other Ambulatory Visit: Payer: Self-pay

## 2019-03-25 ENCOUNTER — Ambulatory Visit (INDEPENDENT_AMBULATORY_CARE_PROVIDER_SITE_OTHER): Payer: TRICARE For Life (TFL) | Admitting: Podiatry

## 2019-03-25 ENCOUNTER — Encounter: Payer: Self-pay | Admitting: Podiatry

## 2019-03-25 VITALS — Temp 97.5°F

## 2019-03-25 DIAGNOSIS — E119 Type 2 diabetes mellitus without complications: Secondary | ICD-10-CM

## 2019-03-25 DIAGNOSIS — B351 Tinea unguium: Secondary | ICD-10-CM | POA: Diagnosis not present

## 2019-03-25 DIAGNOSIS — M2010 Hallux valgus (acquired), unspecified foot: Secondary | ICD-10-CM | POA: Diagnosis not present

## 2019-03-25 DIAGNOSIS — M79676 Pain in unspecified toe(s): Secondary | ICD-10-CM

## 2019-03-25 NOTE — Progress Notes (Addendum)
Patient ID: Anthony Cuevas, male   DOB: [birth year], 70 y.o.   MRN: [id number] Complaint:  Visit Type: Patient returns to my office for continued preventative foot care services. Complaint: Patient states" my nails have grown long and thick and become painful to walk and wear shoes" Patient has been diagnosed with DM with no foot complications. The patient presents for preventative foot care services. No changes to ROS.        Podiatric Exam: Vascular: dorsalis pedis and posterior tibial pulses are palpable bilateral. Capillary return is immediate. Temperature gradient is WNL. Skin turgor WNL  Sensorium: Diminished  Semmes Weinstein monofilament test. Normal tactile sensation bilaterally. Nail Exam: Pt has thick disfigured discolored nails with subungual debris noted bilateral entire nail hallux through fifth toenails.  The hallux toenail is unattached right nail bed except at proximal nail fold. No redness or swelling noted. Ulcer Exam: There is no evidence of ulcer or pre-ulcerative changes or infection. Orthopedic Exam: Muscle tone and strength are WNL. No limitations in general ROM. No crepitus or effusions noted. Foot type and digits show no abnormalities. Bony prominences are unremarkable.HAV B/L Skin: No Porokeratosis. 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 The patient understood the discussion of treatment and procedures well. All questions were answered thoroughly reviewed. Debridement of mycotic and hypertrophic toenails, 1 through 5 bilateral and clearing of subungual debris. No ulceration, no infection noted.  Visual inspection of the diabetic foot was performed. Return Visit-Office Procedure: Patient instructed to return to the office for a follow up visit 3 months for continued evaluation and  treatment.    Gardiner Barefoot DPM

## 2019-03-30 ENCOUNTER — Ambulatory Visit: Payer: Medicare HMO | Attending: Internal Medicine

## 2019-03-30 DIAGNOSIS — Z23 Encounter for immunization: Secondary | ICD-10-CM | POA: Insufficient documentation

## 2019-03-30 DIAGNOSIS — E119 Type 2 diabetes mellitus without complications: Secondary | ICD-10-CM | POA: Diagnosis not present

## 2019-03-30 NOTE — Progress Notes (Signed)
   Covid-19 Vaccination Clinic  Name:  Anthony Cuevas    MRN: [id number] DOB: Aug 26, 1950  03/30/2019  Mr. Baiamonte was observed post Covid-19 immunization for 15 minutes without incidence. He was provided with Vaccine Information Sheet and instruction to access the V-Safe system.   Mr. Eckart was instructed to call 911 with any severe reactions post vaccine: Marland Kitchen Difficulty breathing  . Swelling of your face and throat  . A fast heartbeat  . A bad rash all over your body  . Dizziness and weakness    Immunizations Administered    Name Date Dose VIS Date Route   Pfizer COVID-19 Vaccine 03/30/2019 11:01 AM 0.3 mL 01/10/2019 Intramuscular   Manufacturer: [place]   Lot: [id number]   [place]: [id number]

## 2019-04-05 ENCOUNTER — Other Ambulatory Visit: Payer: Self-pay | Admitting: Endocrinology

## 2019-04-10 DIAGNOSIS — H401131 Primary open-angle glaucoma, bilateral, mild stage: Secondary | ICD-10-CM | POA: Diagnosis not present

## 2019-04-21 ENCOUNTER — Encounter: Payer: Self-pay | Admitting: Endocrinology

## 2019-04-21 ENCOUNTER — Other Ambulatory Visit: Payer: Self-pay

## 2019-04-21 ENCOUNTER — Ambulatory Visit (INDEPENDENT_AMBULATORY_CARE_PROVIDER_SITE_OTHER): Payer: Medicare Other | Admitting: Endocrinology

## 2019-04-21 ENCOUNTER — Ambulatory Visit: Payer: TRICARE For Life (TFL) | Attending: Internal Medicine

## 2019-04-21 VITALS — BP 124/64 | HR 103 | Ht 70.0 in | Wt 216.0 lb

## 2019-04-21 DIAGNOSIS — E11649 Type 2 diabetes mellitus with hypoglycemia without coma: Secondary | ICD-10-CM | POA: Diagnosis not present

## 2019-04-21 DIAGNOSIS — Z23 Encounter for immunization: Secondary | ICD-10-CM

## 2019-04-21 LAB — POCT GLYCOSYLATED HEMOGLOBIN (HGB A1C): Hemoglobin A1C: 7.9 % — AB (ref 4.0–5.6)

## 2019-04-21 MED ORDER — NOVOLIN N FLEXPEN 100 UNIT/ML ~~LOC~~ SUPN: 140.0000 [IU] | PEN_INJECTOR | SUBCUTANEOUS | 3 refills | Status: DC

## 2019-04-21 NOTE — Progress Notes (Signed)
   Covid-19 Vaccination Clinic  Name:  ZYHEIM CORDS    MRN: [id number] DOB: 1950/04/01  04/21/2019  Mr. Tienken was observed post Covid-19 immunization for 15 minutes without incident. He was provided with Vaccine Information Sheet and instruction to access the V-Safe system.   Mr. Kawczynski was instructed to call 911 with any severe reactions post vaccine: Marland Kitchen Difficulty breathing  . Swelling of face and throat  . A fast heartbeat  . A bad rash all over body  . Dizziness and weakness   Immunizations Administered    Name Date Dose VIS Date Route   Pfizer COVID-19 Vaccine 04/21/2019  8:28 AM 0.3 mL 01/10/2019 Intramuscular   Manufacturer: [place]   Lot: [id number]   [place]: [id number]

## 2019-04-21 NOTE — Patient Instructions (Addendum)
check your blood sugar 4 times a day.  vary the time of day when you check, between before the 3 meals, and at bedtime.  also check if you have symptoms of your blood sugar being too high or too low.  please keep a record of the readings and bring it to your next appointment here (or you can bring the meter itself).  You can write it on any piece of paper.  please call us sooner if your blood sugar goes below 70, or if you have a lot of readings over 200.   Please change the insulin to 160 units each morning, and 20 units each evening.  Please come back for a follow-up appointment in 2 months.

## 2019-04-21 NOTE — Progress Notes (Signed)
Subjective:    Patient ID: Anthony Cuevas, male    DOB: Dec 24, 1950, 69 y.o.   MRN: [id number]  HPI Pt returns for f/u of diabetes mellitus:  DM type: Insulin-requiring type 2 Dx'ed: 0000000 Complications: none Therapy: insulin since 2006, and metformin.   DKA: never Severe hypoglycemia: never.  Pancreatitis: never Pancreatic imaging: never SDOH: he takes human insulin, due to cost.   Other: he stopped pioglitizone, due to edema; he takes QD insulin, after poor results with multiple daily injections; he changed to NPH, due to pattern of cbg's.   Interval history: I reviewed continuous glucose monitor data.  glucose varies from 40-300.  It is in general lowest in the afternoon, and higher at all other times of day.  He eat a light lunch.  He takes 180 units qam.   Past Medical History:  Diagnosis Date  . Arthritis   . Depression    improved on sertraline  . Diabetes mellitus, type 2 ([place]) 09/1996  . GERD (gastroesophageal reflux disease) 1990  . History of peptic ulcer disease   . Hyperlipidemia 1992  . Hypertension 05/2003  . Lumbar stenosis L2-3  . Muscle atrophy    in arms from degenerative changes in neck  . Neck pain    chronic  . Smoker   . Wears glasses     Past Surgical History:  Procedure Laterality Date  . CERVICAL DISC SURGERY  04/19/02   fusion, Dr. Lorin Mercy  . CERVICAL DISCECTOMY  1997   partial  . CERVICAL DISCECTOMY  2000  . COLONOSCOPY WITH ESOPHAGOGASTRODUODENOSCOPY (EGD)  06/25/2015  . [place]  . KNEE ARTHROPLASTY  11/20/2011   Procedure: COMPUTER ASSISTED TOTAL KNEE ARTHROPLASTY;  Surgeon: Marybelle Killings, MD;  Location: [facility];  Service: Orthopedics;  Laterality: Left;  Conversion Left Knee Medial Uni to Total Knee Arthroplasty-Cemented  . LUMBAR LAMINECTOMY/DECOMPRESSION MICRODISCECTOMY N/A 04/28/2015   Procedure: L2-3 Decompression;  Surgeon: Marybelle Killings, MD;  Location: [facility];  Service: Orthopedics;  Laterality: N/A;  . MR MRA DUPLICATE  EXAM  INACTIVATE    . MULTIPLE TOOTH EXTRACTIONS    . 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 REPLACEMENT TOTAL KNEE  02/14/02, 05/04/02   left- partial  . SCC EXCISION  04/25/01   Dr. March Rummage  . STOMACH SURGERY  1984   PUD, [place]    Social History   Socioeconomic History  . Marital status: Married    Spouse name: Not on file  . Number of children: 3  . Years of education: Not on file  . Highest education level: Not on file  Occupational History  . Occupation: Training and development officer, retired    Comment: Scientific laboratory technician  Tobacco Use  . Smoking status: Former Smoker    Packs/day: 1.00    Years: 41.00    Pack years: 41.00    Types: Cigarettes    Quit date: 2014    Years since quitting: 7.2  . Smokeless tobacco: Never Used  Substance and Sexual Activity  . Alcohol use: No    Alcohol/week: 0.0 standard drinks  . Drug use: No  . Sexual activity: Never  Other Topics Concern  . 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  Difficulty of Paying Living Expenses: Not hard at all  Food Insecurity: No Food Insecurity  . Worried About Charity fundraiser in the Last Year: Never true  . Ran Out of Food in the Last Year: Never true  Transportation Needs: No Transportation Needs  . Lack of Transportation (Medical): No  . Lack of Transportation (Non-Medical): No  Physical Activity: Inactive  . Days of Exercise per Week: 0 days  . Minutes of Exercise per Session: 0 min  Stress: No Stress Concern Present  . Feeling of Stress : Not at all  Social Connections:   . Frequency of Communication with Friends and Family:   . Frequency of Social Gatherings with Friends and Family:   . Attends Religious Services:   . Active Member of Clubs or Organizations:   . Attends Archivist  Meetings:   Marland Kitchen Marital Status:   Intimate Partner Violence: Not At Risk  . Fear of Current or Ex-Partner: No  . Emotionally Abused: No  . Physically Abused: No  . Sexually Abused: No    Current Outpatient Medications on File Prior to Visit  Medication Sig Dispense Refill  . celecoxib (CELEBREX) 200 MG capsule TAKE 1 CAPSULE DAILY 90 capsule 3  . cholecalciferol (VITAMIN D) 1000 UNITS tablet Take 5,000 Units by mouth 2 (two) times a week.    . Continuous Blood Gluc Sensor (FREESTYLE LIBRE 14 DAY SENSOR) MISC 1 Device by Does not apply route every 14 (fourteen) days. 6 each 3  . cyanocobalamin 1000 MCG tablet Take 1,000 mcg by mouth daily.    . cyclobenzaprine (FLEXERIL) 10 MG tablet TAKE 1 TABLET TWICE A DAY AS NEEDED FOR MUSCLE SPASMS.  Sedation caution. 180 tablet 1  . Insulin Pen Needle (SURE COMFORT PEN NEEDLES) 32G X 4 MM MISC Use to inject insulin BID; E11.65 100 each 2  . lisinopril (ZESTRIL) 20 MG tablet TAKE 1 TABLET DAILY 90 tablet 3  . LUMIGAN 0.01 % SOLN Place 1 drop into both eyes at bedtime.     . metFORMIN (GLUCOPHAGE) 1000 MG tablet TAKE 1 TABLET TWICE A DAY WITH MEALS 180 tablet 3  . niacin (NIASPAN) 500 MG CR tablet TAKE 1 TABLET AT BEDTIME 90 tablet 2  . Omega-3 Fatty Acids (FISH OIL PO) Take 1,000 mg by mouth once a week.    . pravastatin (PRAVACHOL) 80 MG tablet TAKE 1 TABLET DAILY 90 tablet 3  . traMADol (ULTRAM) 50 MG tablet TAKE 1 TABLET EVERY 8 HOURS AS NEEDED FOR CHRONIC JOINT/BACK PAIN (SEDATION CAUTION) 270 tablet 0   No current facility-administered medications on file prior to visit.    No Known Allergies  Family History  Problem Relation Age of Onset  . Diabetes Mother   . Hypertension Mother   . Stroke Mother   . Diabetes Sister   . Diabetes Brother   . Diabetes Brother   . Heart disease Brother   . Diabetes Father   . Breast cancer Maternal Grandmother        breast  . Lung disease Paternal Grandfather        black lung  . Arthritis Neg Hx    . Prostate cancer Neg Hx   . Colon cancer Neg Hx   . Stomach cancer Neg Hx     BP 124/64   Pulse (!) 103   Ht 5\' 10"  (1.778 m)   Wt 216 lb (98 kg)   SpO2 95%   BMI 30.99 kg/m   Review  of Systems Denies LOC.      Objective:   Physical Exam VITAL SIGNS:  See vs page GENERAL: no distress Pulses: dorsalis pedis intact bilat.   MSK: no deformity of the feet CV: trace bilat leg edema Skin:  no ulcer on the feet, but the skin is dry.  normal color and temp on the feet. Neuro: sensation is intact to touch on the feet.   Ext: there is bilateral onychomycosis of the toenails.    Lab Results  Component Value Date   HGBA1C 7.9 (A) 04/21/2019       Assessment & Plan:  Insulin-requiring type 2 DM: he would benefit from increased rx, if it can be done with a regimen that avoids or minimizes hypoglycemia. Hypoglycemia: this limits aggressiveness of glycemic control  Patient Instructions  check your blood sugar 4 times a day.  vary the time of day when you check, between before the 3 meals, and at bedtime.  also check if you have symptoms of your blood sugar being too high or too low.  please keep a record of the readings and bring it to your next appointment here (or you can bring the meter itself).  You can write it on any piece of paper.  please call us sooner if your blood sugar goes below 70, or if you have a lot of readings over 200.   Please change the insulin to 160 units each morning, and 20 units each evening.  Please come back for a follow-up appointment in 2 months.

## 2019-04-22 ENCOUNTER — Other Ambulatory Visit: Payer: Self-pay | Admitting: Endocrinology

## 2019-04-22 ENCOUNTER — Other Ambulatory Visit: Payer: Self-pay | Admitting: Family Medicine

## 2019-04-22 MED ORDER — NOVOLIN N FLEXPEN 100 UNIT/ML ~~LOC~~ SUPN: PEN_INJECTOR | SUBCUTANEOUS | 3 refills | Status: DC

## 2019-04-22 NOTE — Telephone Encounter (Signed)
Electronic refill request. Tramadol Last office visit:   12/06/2018 Last Filled:    270 tablet 0 02/14/2019  Please advise.

## 2019-04-23 NOTE — Telephone Encounter (Signed)
Sent. Thanks.   

## 2019-05-05 ENCOUNTER — Other Ambulatory Visit: Payer: Self-pay | Admitting: Family Medicine

## 2019-05-06 NOTE — Telephone Encounter (Signed)
Electronic refill request. Cyclobenzaprine Last office visit:   12/06/2018 Last Filled:     180 tablet 1 12/25/2018  Please advise.

## 2019-05-07 NOTE — Telephone Encounter (Signed)
Sent. Thanks.   

## 2019-05-14 ENCOUNTER — Other Ambulatory Visit (INDEPENDENT_AMBULATORY_CARE_PROVIDER_SITE_OTHER): Payer: Medicare Other

## 2019-05-14 ENCOUNTER — Telehealth: Payer: Self-pay

## 2019-05-14 ENCOUNTER — Other Ambulatory Visit: Payer: Medicare HMO

## 2019-05-14 DIAGNOSIS — D509 Iron deficiency anemia, unspecified: Secondary | ICD-10-CM | POA: Diagnosis not present

## 2019-05-14 LAB — CBC WITH DIFFERENTIAL/PLATELET
Basophils Absolute: 0 10*3/uL (ref 0.0–0.1)
Basophils Relative: 0.6 % (ref 0.0–3.0)
Eosinophils Absolute: 0.2 10*3/uL (ref 0.0–0.7)
Eosinophils Relative: 2.9 % (ref 0.0–5.0)
HCT: 38.4 % — ABNORMAL LOW (ref 39.0–52.0)
Hemoglobin: 13.2 g/dL (ref 13.0–17.0)
Lymphocytes Relative: 21.4 % (ref 12.0–46.0)
Lymphs Abs: 1.2 10*3/uL (ref 0.7–4.0)
MCHC: 34.3 g/dL (ref 30.0–36.0)
MCV: 97.2 fl (ref 78.0–100.0)
Monocytes Absolute: 0.6 10*3/uL (ref 0.1–1.0)
Monocytes Relative: 11.7 % (ref 3.0–12.0)
Neutro Abs: 3.5 10*3/uL (ref 1.4–7.7)
Neutrophils Relative %: 63.4 % (ref 43.0–77.0)
Platelets: 166 10*3/uL (ref 150.0–400.0)
RBC: 3.95 Mil/uL — ABNORMAL LOW (ref 4.22–5.81)
RDW: 13.6 % (ref 11.5–15.5)
WBC: 5.5 10*3/uL (ref 4.0–10.5)

## 2019-05-14 LAB — IRON: Iron: 85 ug/dL (ref 42–165)

## 2019-05-14 NOTE — Telephone Encounter (Signed)
FAXED DOCUMENTS - [facility] [place]: [place] [facility]  Document: Rx for [employer] Other records requested: Office notes  NOTE - Attempted to fax @ 1455 but fax failed to send pages 5 and 11. Fax was immediately sent @ 1600 and successfully sent ALL 12 pages.  All above requested information has been faxed successfully to [employer] [facility] listed above. Documents and fax confirmation have been placed in the faxed file for future reference.

## 2019-05-22 ENCOUNTER — Other Ambulatory Visit: Payer: Self-pay | Admitting: Family Medicine

## 2019-05-30 ENCOUNTER — Other Ambulatory Visit: Payer: Self-pay

## 2019-05-30 ENCOUNTER — Telehealth: Payer: Self-pay | Admitting: Endocrinology

## 2019-05-30 DIAGNOSIS — E11649 Type 2 diabetes mellitus with hypoglycemia without coma: Secondary | ICD-10-CM

## 2019-05-30 MED ORDER — FREESTYLE LIBRE 14 DAY SENSOR MISC: 1.0000 | 3 refills | Status: DC

## 2019-05-30 NOTE — Telephone Encounter (Signed)
Patient is consolidating pharmacies and is requesting the Free Style Libre sensors and receiver to be sent to [facility]

## 2019-05-30 NOTE — Telephone Encounter (Signed)
Outpatient Medication Detail   Disp Refills Start End   Continuous Blood Gluc Sensor (FREESTYLE LIBRE 14 DAY SENSOR) MISC 6 each 3 05/30/2019    Sig - Route: 1 Device by Does not apply route every 14 (fourteen) days. - Does not apply   Sent to pharmacy as: Continuous Blood Gluc Sensor (FREESTYLE LIBRE [place]) Misc   E-Prescribing Status: Receipt confirmed by pharmacy (05/30/2019  8:12 AM EDT)    Pharmacy  [place], [facility]  [address], [place] [address]  Phone:  [phone]  Fax:  [phone]    Above has already been sent. [employer] is an Set designer. Therefore, our office has NO CONTROL over what insurance [facility] bills for pt Rx. Receiver Rx is not needed as pts only get one receiver UNLESS device is malfunctioning. No further action required.

## 2019-06-03 ENCOUNTER — Other Ambulatory Visit: Payer: Self-pay

## 2019-06-03 DIAGNOSIS — E11649 Type 2 diabetes mellitus with hypoglycemia without coma: Secondary | ICD-10-CM

## 2019-06-03 MED ORDER — FREESTYLE LIBRE 14 DAY SENSOR MISC: 1.0000 | 3 refills | Status: DC

## 2019-06-03 NOTE — Telephone Encounter (Signed)
Patient will no longer use [facility] for the following RX (Sensors). Patient requests a new Rx for the following:   Continuous Blood Gluc Sensor (FREESTYLE LIBRE 14 DAY SENSOR) MISC  (84 day supply) AND FREESTYLE LIBRE READER Sent to: [facility] located at [address] [place], [place] [address], Ph# [phone] (Patient does not have fax#)  Patient states [facility] will be faxing the above RX request as well.

## 2019-06-03 NOTE — Telephone Encounter (Signed)
Outpatient Medication Detail   Disp Refills Start End   Continuous Blood Gluc Sensor (FREESTYLE LIBRE 14 DAY SENSOR) MISC 6 each 3 06/03/2019    Sig - Route: 1 Device by Does not apply route every 14 (fourteen) days. - Does not apply   Sent to pharmacy as: Continuous Blood Gluc Sensor (FREESTYLE LIBRE [place]) Misc   E-Prescribing Status: Receipt confirmed by pharmacy (06/03/2019 1:19 PM EDT)    Above sent to [facility] as requested. As previously stated, receiver Rx is not needed as pts only get one receiver UNLESS device is malfunctioning. No further action required for sending Rx for a receiver.

## 2019-06-16 ENCOUNTER — Telehealth: Payer: Self-pay

## 2019-06-16 NOTE — Telephone Encounter (Signed)
FAXED [address]: [facility] for CGM Other records requested: Office notes  All above requested information has been faxed successfully to [employer] [facility] listed above. Documents and fax confirmation have been placed in the faxed file for future reference.

## 2019-06-19 ENCOUNTER — Other Ambulatory Visit: Payer: Self-pay

## 2019-06-20 DIAGNOSIS — Z794 Long term (current) use of insulin: Secondary | ICD-10-CM | POA: Diagnosis not present

## 2019-06-20 DIAGNOSIS — E119 Type 2 diabetes mellitus without complications: Secondary | ICD-10-CM | POA: Diagnosis not present

## 2019-06-23 ENCOUNTER — Encounter: Payer: Self-pay | Admitting: Endocrinology

## 2019-06-23 ENCOUNTER — Ambulatory Visit (INDEPENDENT_AMBULATORY_CARE_PROVIDER_SITE_OTHER): Payer: Medicare Other | Admitting: Endocrinology

## 2019-06-23 ENCOUNTER — Other Ambulatory Visit: Payer: Self-pay

## 2019-06-23 VITALS — BP 140/70 | HR 75 | Ht 70.0 in | Wt 212.0 lb

## 2019-06-23 DIAGNOSIS — E11649 Type 2 diabetes mellitus with hypoglycemia without coma: Secondary | ICD-10-CM | POA: Diagnosis not present

## 2019-06-23 LAB — POCT GLYCOSYLATED HEMOGLOBIN (HGB A1C): Hemoglobin A1C: 7.8 % — AB (ref 4.0–5.6)

## 2019-06-23 MED ORDER — NOVOLIN N FLEXPEN 100 UNIT/ML ~~LOC~~ SUPN: PEN_INJECTOR | SUBCUTANEOUS | 3 refills | Status: DC

## 2019-06-23 NOTE — Progress Notes (Signed)
Subjective:    Patient ID: Anthony Cuevas, male    DOB: Dec 13, 1950, 69 y.o.   MRN: [id number]  HPI Pt returns for f/u of diabetes mellitus:  DM type: Insulin-requiring type 2 Dx'ed: 0000000 Complications: none Therapy: insulin since 2006, and metformin.   DKA: never Severe hypoglycemia: never.  Pancreatitis: never Pancreatic imaging: never SDOH: he takes human insulin, due to cost.   Other: he stopped pioglitizone, due to edema; he takes BID insulin, after poor results with multiple daily injections; he changed to NPH, due to pattern of cbg's.   Interval history: he just obtained a new continuous glucose monitor sensor.  glucose varies from 81-394.  It is in general lowest in the afternoon, and highest at HS. He takes 180 units qam, and 20 units qpm.   Past Medical History:  Diagnosis Date  . Arthritis   . Depression    improved on sertraline  . Diabetes mellitus, type 2 ([place]) 09/1996  . GERD (gastroesophageal reflux disease) 1990  . History of peptic ulcer disease   . Hyperlipidemia 1992  . Hypertension 05/2003  . Lumbar stenosis L2-3  . Muscle atrophy    in arms from degenerative changes in neck  . Neck pain    chronic  . Smoker   . Wears glasses     Past Surgical History:  Procedure Laterality Date  . CERVICAL DISC SURGERY  04/19/02   fusion, Dr. Lorin Mercy  . CERVICAL DISCECTOMY  1997   partial  . CERVICAL DISCECTOMY  2000  . COLONOSCOPY WITH ESOPHAGOGASTRODUODENOSCOPY (EGD)  06/25/2015  . [place]  . KNEE ARTHROPLASTY  11/20/2011   Procedure: COMPUTER ASSISTED TOTAL KNEE ARTHROPLASTY;  Surgeon: Marybelle Killings, MD;  Location: [facility];  Service: Orthopedics;  Laterality: Left;  Conversion Left Knee Medial Uni to Total Knee Arthroplasty-Cemented  . LUMBAR LAMINECTOMY/DECOMPRESSION MICRODISCECTOMY N/A 04/28/2015   Procedure: L2-3 Decompression;  Surgeon: Marybelle Killings, MD;  Location: [facility];  Service: Orthopedics;  Laterality: N/A;  . MR MRA DUPLICATE EXAM   INACTIVATE    . MULTIPLE TOOTH EXTRACTIONS    . 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 REPLACEMENT TOTAL KNEE  02/14/02, 05/04/02   left- partial  . SCC EXCISION  04/25/01   Dr. March Rummage  . STOMACH SURGERY  1984   PUD, [facility]    Social History   Socioeconomic History  . Marital status: Married    Spouse name: Not on file  . Number of children: 3  . Years of education: Not on file  . Highest education level: Not on file  Occupational History  . Occupation: Training and development officer, retired    Comment: Scientific laboratory technician  Tobacco Use  . Smoking status: Former Smoker    Packs/day: 1.00    Years: 41.00    Pack years: 41.00    Types: Cigarettes    Quit date: 2014    Years since quitting: 7.4  . Smokeless tobacco: Never Used  Substance and Sexual Activity  . Alcohol use: No    Alcohol/week: 0.0 standard drinks  . Drug use: No  . Sexual activity: Never  Other Topics Concern  . 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 Difficulty of Paying Living  Expenses: Not hard at all  Food Insecurity: No Food Insecurity  . Worried About Charity fundraiser in the Last Year: Never true  . Ran Out of Food in the Last Year: Never true  Transportation Needs: No Transportation Needs  . Lack of Transportation (Medical): No  . Lack of Transportation (Non-Medical): No  Physical Activity: Inactive  . Days of Exercise per Week: 0 days  . Minutes of Exercise per Session: 0 min  Stress: No Stress Concern Present  . Feeling of Stress : Not at all  Social Connections:   . Frequency of Communication with Friends and Family:   . Frequency of Social Gatherings with Friends and Family:   . Attends Religious Services:   . Active Member of Clubs or Organizations:   . Attends Archivist  Meetings:   Marland Kitchen Marital Status:   Intimate Partner Violence: Not At Risk  . Fear of Current or Ex-Partner: No  . Emotionally Abused: No  . Physically Abused: No  . Sexually Abused: No    Current Outpatient Medications on File Prior to Visit  Medication Sig Dispense Refill  . celecoxib (CELEBREX) 200 MG capsule TAKE 1 CAPSULE DAILY 90 capsule 3  . cholecalciferol (VITAMIN D) 1000 UNITS tablet Take 5,000 Units by mouth 2 (two) times a week.    . cyanocobalamin 1000 MCG tablet Take 1,000 mcg by mouth daily.    . cyclobenzaprine (FLEXERIL) 10 MG tablet TAKE 1 TABLET TWICE A DAY AS NEEDED FOR MUSCLE SPASMS (SEDATION CAUTION) 180 tablet 1  . Insulin Pen Needle (SURE COMFORT PEN NEEDLES) 32G X 4 MM MISC Use to inject insulin BID; E11.65 100 each 2  . lisinopril (ZESTRIL) 20 MG tablet TAKE 1 TABLET DAILY 90 tablet 3  . LUMIGAN 0.01 % SOLN Place 1 drop into both eyes at bedtime.     . metFORMIN (GLUCOPHAGE) 1000 MG tablet TAKE 1 TABLET TWICE A DAY WITH MEALS 180 tablet 3  . niacin (NIASPAN) 500 MG CR tablet TAKE 1 TABLET AT BEDTIME 90 tablet 2  . Omega-3 Fatty Acids (FISH OIL PO) Take 1,000 mg by mouth once a week.    Glory [facility]ush ULTRA test strip USE AS INSTRUCTED TO TEST BLOOD SUGARS 3 OR 4 TIMES DAILY 100 each 11  . pravastatin (PRAVACHOL) 80 MG tablet TAKE 1 TABLET DAILY 90 tablet 3  . traMADol (ULTRAM) 50 MG tablet TAKE 1 TABLET EVERY 8 HOURS AS NEEDED FOR CHRONIC JOINT/BACK PAIN (SEDATION CAUTION) 270 tablet 0  . Continuous Blood Gluc Sensor (FREESTYLE LIBRE 14 DAY SENSOR) MISC 1 Device by Does not apply route every 14 (fourteen) days. 6 each 3   No current facility-administered medications on file prior to visit.    No Known Allergies  Family History  Problem Relation Age of Onset  . Diabetes Mother   . Hypertension Mother   . Stroke Mother   . Diabetes Sister   . Diabetes Brother   . Diabetes Brother   . Heart disease Brother   . Diabetes Father   . Breast cancer Maternal  Grandmother        breast  . Lung disease Paternal Grandfather        black lung  . Arthritis Neg Hx   . Prostate cancer Neg Hx   . Colon cancer Neg Hx   . Stomach cancer Neg Hx     BP 140/70   Pulse 75   Ht 5\' 10"  (1.778 m)   Wt 212  lb (96.2 kg)   SpO2 98%   BMI 30.42 kg/m    Review of Systems He denies hypoglycemia.      Objective:   Physical Exam VITAL SIGNS:  See vs page GENERAL: no distress Pulses: dorsalis pedis intact bilat.   MSK: no deformity of the feet CV: trace bilat leg edema Skin:  no ulcer on the feet.  normal color and temp on the feet. Neuro: sensation is intact to touch on the feet, but decreased from normal Ext: there is bilateral onychomycosis of the toenails.    Lab Results  Component Value Date   HGBA1C 7.8 (A) 06/23/2019        Assessment & Plan:  HTN: is noted today Insulin-requiring type 2 DM: The pattern of his cbg's indicates he needs some adjustment in his therapy.    Patient Instructions  Your blood pressure is high today.  Please see your primary care provider soon, to have it rechecked check your blood sugar 4 times a day.  vary the time of day when you check, between before the 3 meals, and at bedtime.  also check if you have symptoms of your blood sugar being too high or too low.  please keep a record of the readings and bring it to your next appointment here (or you can bring the meter itself).  You can write it on any piece of paper.  please call us sooner if your blood sugar goes below 70, or if you have a lot of readings over 200.   Please change the insulin to 170 units each morning, and 30 units each evening.  Please come back for a follow-up appointment in 2 months.

## 2019-06-23 NOTE — Patient Instructions (Addendum)
Your blood pressure is high today.  Please see your primary care provider soon, to have it rechecked check your blood sugar 4 times a day.  vary the time of day when you check, between before the 3 meals, and at bedtime.  also check if you have symptoms of your blood sugar being too high or too low.  please keep a record of the readings and bring it to your next appointment here (or you can bring the meter itself).  You can write it on any piece of paper.  please call us sooner if your blood sugar goes below 70, or if you have a lot of readings over 200.   Please change the insulin to 170 units each morning, and 30 units each evening.  Please come back for a follow-up appointment in 2 months.

## 2019-06-24 ENCOUNTER — Encounter: Payer: Self-pay | Admitting: Podiatry

## 2019-06-24 ENCOUNTER — Ambulatory Visit (INDEPENDENT_AMBULATORY_CARE_PROVIDER_SITE_OTHER): Payer: 59 | Admitting: Podiatry

## 2019-06-24 VITALS — Temp 96.7°F

## 2019-06-24 DIAGNOSIS — M2010 Hallux valgus (acquired), unspecified foot: Secondary | ICD-10-CM

## 2019-06-24 DIAGNOSIS — M79676 Pain in unspecified toe(s): Secondary | ICD-10-CM

## 2019-06-24 DIAGNOSIS — B351 Tinea unguium: Secondary | ICD-10-CM

## 2019-06-24 DIAGNOSIS — E119 Type 2 diabetes mellitus without complications: Secondary | ICD-10-CM | POA: Diagnosis not present

## 2019-06-24 NOTE — Progress Notes (Signed)
This patient returns to my office for at risk foot care.  This patient requires this care by a professional since this patient will be at risk due to having type 2 diabetes.  This patient is unable to cut nails himself since the patient cannot reach his nails.These nails are painful walking and wearing shoes.  This patient presents for at risk foot care today.  General Appearance  Alert, conversant and in no acute stress.  Vascular  Dorsalis pedis and posterior tibial  pulses are palpable  bilaterally.  Capillary return is within normal limits  bilaterally. Temperature is within normal limits  bilaterally.  Neurologic  Senn-Weinstein monofilament wire test diminished  bilaterally. Muscle power within normal limits bilaterally.  Nails Thick disfigured discolored nails with subungual debris  from hallux to fifth toes bilaterally. No evidence of bacterial infection or drainage bilaterally.  Orthopedic  No limitations of motion  feet .  No crepitus or effusions noted.  No bony pathology or digital deformities noted.  HAV  B/L.  Skin  normotropic skin with no porokeratosis noted bilaterally.  No signs of infections or ulcers noted.     Onychomycosis  Pain in right toes  Pain in left toes  Consent was obtained for treatment procedures.   Mechanical debridement of nails 1-5  bilaterally performed with a nail nipper.  Filed with dremel without incident.    Return office visit    10   weeks                  Told patient to return for periodic foot care and evaluation due to potential at risk complications.   Woody Kronberg DPM  

## 2019-06-25 ENCOUNTER — Telehealth: Payer: Self-pay

## 2019-06-25 NOTE — Telephone Encounter (Signed)
FAXED [place]: [place]: [facility] for CGM Other records requested: Office notes and logs  All above requested information has been faxed successfully to [employer] listed above. Documents and fax confirmation have been placed in the faxed file for future reference.

## 2019-06-27 ENCOUNTER — Telehealth: Payer: Self-pay | Admitting: Family Medicine

## 2019-06-27 NOTE — Telephone Encounter (Signed)
Brayton Layman, NP with [facility] calls called the office today to give update. She stated they make rounds once a year to do a physical assessment. She stated she went out to see the patient and did a PAD screening Patient's right foot was normal 1.03 But left foot was 0.43   She stated they will send a full report to you but it takes 2-3 weeks before it is sent and she wanted to make sure you were aware of this before the report is received.  She stated if you have any questions to give her a call back  [phone]

## 2019-06-27 NOTE — Telephone Encounter (Signed)
Pt scheduled for Thursday 6/3 at 9:30  Pt advised that if any worsening in leg condition, severe pain, cramping to where he cannot walk to be evaluated either ED or UC. Pt also aware that he may if symptoms not severe wait and call Tuesday for a sooner appt with our office. He expressed understanding and states that he does not foresee anything worsening but if in the even that it does he will keep recommendations in mind.   Nothing further needed.

## 2019-06-27 NOTE — Telephone Encounter (Signed)
Please see if patient has any claudication or skin breakdown.  Please schedule OV so we can check his legs here.  Thanks.

## 2019-06-30 NOTE — Telephone Encounter (Signed)
Noted. Thanks. Agreed.  

## 2019-07-03 ENCOUNTER — Ambulatory Visit (INDEPENDENT_AMBULATORY_CARE_PROVIDER_SITE_OTHER)
Admission: RE | Admit: 2019-07-03 | Discharge: 2019-07-03 | Disposition: A | Discharge status: Home / Self Care | Payer: Medicare Other | Source: Ambulatory Visit | Attending: Family Medicine | Admitting: Family Medicine

## 2019-07-03 ENCOUNTER — Other Ambulatory Visit: Payer: Self-pay

## 2019-07-03 ENCOUNTER — Ambulatory Visit (INDEPENDENT_AMBULATORY_CARE_PROVIDER_SITE_OTHER): Payer: Medicare Other | Admitting: Family Medicine

## 2019-07-03 ENCOUNTER — Encounter: Payer: Self-pay | Admitting: Family Medicine

## 2019-07-03 VITALS — BP 130/62 | HR 100 | Temp 97.2°F | Ht 70.0 in | Wt 212.6 lb

## 2019-07-03 DIAGNOSIS — D649 Anemia, unspecified: Secondary | ICD-10-CM | POA: Diagnosis not present

## 2019-07-03 DIAGNOSIS — R0989 Other specified symptoms and signs involving the circulatory and respiratory systems: Secondary | ICD-10-CM | POA: Diagnosis not present

## 2019-07-03 DIAGNOSIS — M25552 Pain in left hip: Secondary | ICD-10-CM | POA: Diagnosis not present

## 2019-07-03 LAB — CBC WITH DIFFERENTIAL/PLATELET
Basophils Absolute: 0 10*3/uL (ref 0.0–0.1)
Basophils Relative: 0.7 % (ref 0.0–3.0)
Eosinophils Absolute: 0.2 10*3/uL (ref 0.0–0.7)
Eosinophils Relative: 2.7 % (ref 0.0–5.0)
HCT: 41.1 % (ref 39.0–52.0)
Hemoglobin: 14.1 g/dL (ref 13.0–17.0)
Lymphocytes Relative: 21.8 % (ref 12.0–46.0)
Lymphs Abs: 1.5 10*3/uL (ref 0.7–4.0)
MCHC: 34.3 g/dL (ref 30.0–36.0)
MCV: 98.1 fl (ref 78.0–100.0)
Monocytes Absolute: 0.9 10*3/uL (ref 0.1–1.0)
Monocytes Relative: 14 % — ABNORMAL HIGH (ref 3.0–12.0)
Neutro Abs: 4.1 10*3/uL (ref 1.4–7.7)
Neutrophils Relative %: 60.8 % (ref 43.0–77.0)
Platelets: 177 10*3/uL (ref 150.0–400.0)
RBC: 4.19 Mil/uL — ABNORMAL LOW (ref 4.22–5.81)
RDW: 13.3 % (ref 11.5–15.5)
WBC: 6.7 10*3/uL (ref 4.0–10.5)

## 2019-07-03 LAB — IRON: Iron: 112 ug/dL (ref 42–165)

## 2019-07-03 MED ORDER — FERROUS SULFATE 325 (65 FE) MG PO TABS: 325.0000 mg | ORAL_TABLET | Freq: Every day | ORAL | Status: DC

## 2019-07-03 NOTE — Progress Notes (Signed)
This visit occurred during the SARS-CoV-2 public health emergency.  Safety protocols were in place, including screening questions prior to the visit, additional usage of staff PPE, and extensive cleaning of exam room while observing appropriate contact time as indicated for disinfecting solutions.  He had screening for PAD.   Patient's right foot was normal 1.03.  Left foot was 0.43   He is off pravastatin currently.  He stopped it to see if joint aches improved but they didn't.  D/w pt about restart pravastatin.    Last A1c 7.8.  Sugar 127 this AM.  78 currently, fasting.    No CP.  He has L > R leg cramping in the calf and thigh.  He notes cramping at bedtime.  He has some pain in the L hip area that hurts with walking.  No CP.  No skin breakdown or foot ulcers.   He is still on iron 325mg  a day.  D/w pt about rechecking his level today.   See notes on labs.      Meds, vitals, and allergies reviewed.   ROS: Per HPI unless specifically indicated in ROS section   nad ncat Neck supple, no LA rrr ctab Abd soft, not tender.  Positive bowel sounds. Dec L DP and PT pulses.    Intact R DP and PT pulses.  No skin breakdown of the feet.  Normal left hip range of motion.

## 2019-07-03 NOTE — Patient Instructions (Addendum)
Try restarting pravastatin.   Go to the lab on the way out.   If you have mychart we'll likely use that to update you.    Check with your insurance to see if they will cover the shingles shot. We'll call about seeing cardiology.   Take care.  Glad to see you.

## 2019-07-04 ENCOUNTER — Other Ambulatory Visit: Payer: Self-pay | Admitting: Family Medicine

## 2019-07-06 ENCOUNTER — Other Ambulatory Visit: Payer: Self-pay | Admitting: Family Medicine

## 2019-07-06 DIAGNOSIS — R0989 Other specified symptoms and signs involving the circulatory and respiratory systems: Secondary | ICD-10-CM | POA: Insufficient documentation

## 2019-07-06 DIAGNOSIS — M25552 Pain in left hip: Secondary | ICD-10-CM | POA: Insufficient documentation

## 2019-07-06 NOTE — Assessment & Plan Note (Signed)
Unclear if this is related to claudication or primary hip pathology.  See notes on imaging.

## 2019-07-06 NOTE — Assessment & Plan Note (Signed)
Recheck iron and CBC today.  Still taking iron 325 mg a day.

## 2019-07-06 NOTE — Assessment & Plan Note (Signed)
Concern for PAD.  Discussed with patient.  No skin breakdown.  No chest pain.  Still okay for outpatient follow-up.  Refer to cardiology.  Recheck CBC today as anemia could be exacerbating his symptoms.  Continue risk factor reduction and restart statin.  Discussed.

## 2019-07-07 NOTE — Telephone Encounter (Signed)
Electronic refill request. Tramadol Last office visit:   07/03/2019 Last Filled:    270 tablet 0 04/23/2019  Please advise.

## 2019-07-08 NOTE — Telephone Encounter (Signed)
Sent. Thanks.   

## 2019-07-09 ENCOUNTER — Other Ambulatory Visit: Payer: Self-pay

## 2019-07-09 ENCOUNTER — Ambulatory Visit (INDEPENDENT_AMBULATORY_CARE_PROVIDER_SITE_OTHER): Payer: Medicare Other | Admitting: Nurse Practitioner

## 2019-07-09 ENCOUNTER — Encounter: Payer: Self-pay | Admitting: Nurse Practitioner

## 2019-07-09 VITALS — BP 158/80 | HR 105 | Ht 70.0 in | Wt 217.1 lb

## 2019-07-09 DIAGNOSIS — I10 Essential (primary) hypertension: Secondary | ICD-10-CM

## 2019-07-09 DIAGNOSIS — I739 Peripheral vascular disease, unspecified: Secondary | ICD-10-CM

## 2019-07-09 DIAGNOSIS — R000 Tachycardia, unspecified: Secondary | ICD-10-CM | POA: Diagnosis not present

## 2019-07-09 DIAGNOSIS — I2510 Atherosclerotic heart disease of native coronary artery without angina pectoris: Secondary | ICD-10-CM | POA: Diagnosis not present

## 2019-07-09 DIAGNOSIS — E782 Mixed hyperlipidemia: Secondary | ICD-10-CM | POA: Diagnosis not present

## 2019-07-09 MED ORDER — LISINOPRIL 40 MG PO TABS: 40.0000 mg | ORAL_TABLET | Freq: Every day | ORAL | 3 refills | Status: DC

## 2019-07-09 NOTE — Progress Notes (Signed)
Office Visit    Patient Name: Anthony Cuevas Date of Encounter: 07/09/2019  Primary Care Provider:  Tonia Ghent, MD Primary Cardiologist:  Ida Rogue, MD  Chief Complaint    69 year old male with a history of hypertension, hyperlipidemia, diabetes, remote tobacco abuse, and coronary atherosclerosis noted on CT, who presents for follow-up related to left lower extremity claudication.  Past Medical History    Past Medical History:  Diagnosis Date  . Arthritis   . Coronary atherosclerosis    a. 07/2018 CT Chest: 3 vessel coronary atherosclerosis; b. 07/2018 MV: EF 46% (GI uptake artifact), No ischemia/infarct. Mild to mod diffuse Ao atherosclerosis and at least moderate 2 vessel CAD (LAD/RCA) on CT imaging. Low risk.  . Depression    improved on sertraline  . Diabetes mellitus, type 2 ([place]) 09/1996  . GERD (gastroesophageal reflux disease) 1990  . History of peptic ulcer disease   . Hyperlipidemia 1992  . Hypertension 05/2003  . Lumbar stenosis L2-3  . Muscle atrophy    in arms from degenerative changes in neck  . Neck pain    chronic  . Smoker   . Wears glasses    Past Surgical History:  Procedure Laterality Date  . CERVICAL DISC SURGERY  04/19/02   fusion, Dr. Lorin Mercy  . CERVICAL DISCECTOMY  1997   partial  . CERVICAL DISCECTOMY  2000  . COLONOSCOPY WITH ESOPHAGOGASTRODUODENOSCOPY (EGD)  06/25/2015  . [place]  . KNEE ARTHROPLASTY  11/20/2011   Procedure: COMPUTER ASSISTED TOTAL KNEE ARTHROPLASTY;  Surgeon: Marybelle Killings, MD;  Location: [facility];  Service: Orthopedics;  Laterality: Left;  Conversion Left Knee Medial Uni to Total Knee Arthroplasty-Cemented  . LUMBAR LAMINECTOMY/DECOMPRESSION MICRODISCECTOMY N/A 04/28/2015   Procedure: L2-3 Decompression;  Surgeon: Marybelle Killings, MD;  Location: [facility];  Service: Orthopedics;  Laterality: N/A;  . MR MRA DUPLICATE EXAM  INACTIVATE    . MULTIPLE TOOTH EXTRACTIONS    . Neisen funduplication  6333  .  REPLACEMENT TOTAL KNEE  02/14/02, 05/04/02   left- partial  . SCC EXCISION  04/25/01   Dr. March Rummage  . STOMACH SURGERY  1984   PUD, [facility]    Allergies  No Known Allergies  History of Present Illness    69 year old ? w/ a history of hypertension, diabetes, and remote tobacco abuse.  In the setting of tobacco use, he underwent chest CT imaging for lung cancer screening in December 2019, which showed atherosclerotic calcification of the arterial vasculature, including three-vessel coronary calcification.  Repeat CT in June 2020 again showed three-vessel coronary atherosclerosis.  He was subsequently referred to Dr. Rockey Situ and underwent stress testing in July 2020 which was low risk without evidence of ischemia or infarct.  He has been medically managed with statin therapy.  Over the past year, he has not been experiencing any chest pain or dyspnea.  He has noted occasional left hip and thigh discomfort that worsens with exertion and improves with rest.  He does not think this is related to arthritis.  Sometimes the discomfort extends into his calf.  He notes that after exerting, symptoms resolve within a few minutes but then he sometimes experiences cramping at any point along the left leg.  This can last several minutes and he or his wife has to massage it out.  As result of symptoms, his primary care provider recommended he follow-up with [place] for consideration of lower extremity Dopplers/ABIs.  He denies palpitations, PND, orthopnea, dizziness, syncope,  edema, or early satiety.  He has not noticed any change in sensation, hair distribution, or temperature of his lower extremities.  Home Medications    Prior to Admission medications   Medication Sig Start Date End Date Taking? Authorizing Provider  celecoxib (CELEBREX) 200 MG capsule TAKE 1 CAPSULE DAILY 12/11/18   Tonia Ghent, MD  cholecalciferol (VITAMIN D) 1000 UNITS tablet Take 5,000 Units by mouth 2 (two) times a week.    [provider]   Continuous Blood Gluc Sensor (FREESTYLE LIBRE 14 DAY SENSOR) MISC 1 Device by Does not apply route every 14 (fourteen) days. 06/03/19   Renato Shin, MD  cyanocobalamin 1000 MCG tablet Take 1,000 mcg by mouth daily.    [provider]  cyclobenzaprine (FLEXERIL) 10 MG tablet TAKE 1 TABLET TWICE A DAY AS NEEDED FOR MUSCLE SPASMS (SEDATION CAUTION) 05/07/19   Tonia Ghent, MD  Insulin NPH, Human,, Isophane, (NOVOLIN N FLEXPEN) 100 UNIT/ML Kiwkpen 170 units each morning, and 30 units each evening, and pen needles 3/day 06/23/19   Renato Shin, MD  Insulin Pen Needle (SURE COMFORT PEN NEEDLES) 32G X 4 MM MISC Use to inject insulin BID; E11.65 06/21/18   Renato Shin, MD  lisinopril (ZESTRIL) 20 MG tablet TAKE 1 TABLET DAILY 11/06/18   Tonia Ghent, MD  LUMIGAN 0.01 % SOLN Place 1 drop into both eyes at bedtime.  10/06/14   [provider]  metFORMIN (GLUCOPHAGE) 1000 MG tablet TAKE 1 TABLET TWICE A DAY WITH MEALS 04/05/19   Renato Shin, MD  niacin (NIASPAN) 500 MG CR tablet TAKE 1 TABLET AT BEDTIME 03/11/19   Tonia Ghent, MD  Omega-3 Fatty Acids (FISH OIL PO) Take 1,000 mg by mouth once a week.    [provider]  [facility] ULTRA test strip USE AS INSTRUCTED TO TEST BLOOD SUGARS 3 OR 4 TIMES DAILY 05/22/19   Tonia Ghent, MD  pravastatin (PRAVACHOL) 80 MG tablet TAKE 1 TABLET DAILY Patient not taking: Reported on 07/03/2019 11/06/18   Tonia Ghent, MD  traMADol (ULTRAM) 50 MG tablet TAKE 1 TABLET EVERY 8 HOURS AS NEEDED FOR CHRONIC JOINT/BACK PAIN (SEDATION CAUTION) 07/08/19   Tonia Ghent, MD    Review of Systems    Left hip and thigh claudication as outlined above followed by cramping.  He denies chest pain, dyspnea, palpitations, PND, orthopnea, dizziness, syncope, edema, or early satiety.  All other systems reviewed and are otherwise negative except as noted above.  Physical Exam    VS:  BP (!) 158/80 (BP Location: Left Arm, Patient Position: Sitting, Cuff  Size: Normal)   Pulse (!) 105   Ht 5\' 10"  (1.778 m)   Wt 217 lb 2 oz (98.5 kg)   SpO2 98%   BMI 31.15 kg/m  , BMI Body mass index is 31.15 kg/m. GEN: Well nourished, well developed, in no acute distress. HEENT: normal. Neck: Supple, no JVD, carotid bruits, or masses. Cardiac: RRR, no murmurs, rubs, or gallops. No clubbing, cyanosis, edema.  Radials 2+ bilaterally.  On the right, DP/PT 1+.  On the left, 1+ PT.  Unable to feel DP. Respiratory:  Respirations regular and unlabored, clear to auscultation bilaterally. GI: Soft, nontender, nondistended, BS + x 4. MS: no deformity or atrophy. Skin: warm and dry, no rash. Neuro:  Strength and sensation are intact. Psych: Normal affect.  Accessory Clinical Findings    ECG personally reviewed by me today -sinus tachycardia, 105- no acute changes.  Lab  Results  Component Value Date   WBC 6.7 07/03/2019   HGB 14.1 07/03/2019   HCT 41.1 07/03/2019   MCV 98.1 07/03/2019   PLT 177.0 07/03/2019   Lab Results  Component Value Date   CREATININE 1.04 12/04/2018   BUN 18 12/04/2018   NA 139 12/04/2018   K 4.7 12/04/2018   CL 101 12/04/2018   CO2 26 12/04/2018   Lab Results  Component Value Date   ALT 37 12/04/2018   AST 35 12/04/2018   ALKPHOS 58 12/04/2018   BILITOT 0.3 12/04/2018   Lab Results  Component Value Date   CHOL 144 12/04/2018   HDL 31.00 (L) 12/04/2018   LDLCALC 68 07/13/2014   LDLDIRECT 71.0 12/04/2018   TRIG 254.0 (H) 12/04/2018   CHOLHDL 5 12/04/2018    Lab Results  Component Value Date   HGBA1C 7.8 (A) 06/23/2019    Assessment & Plan    1.  Left lower extremity claudication: Patient reports that over the past year, he has been experiencing progressive left hip and quadricep exertional discomfort that resolves with rest.  Symptoms are often followed by cramping.  He has not noted a change in temperature, hair distribution, or sensation.  He does have diminished pulses bilaterally and I was not able to palpate  a DP on the left.  We will arrange for lower extremity arterial duplex with ABIs.  He recently resumed Pravachol therapy in the setting of known coronary atherosclerosis, he should also resume low-dose aspirin.  2.  Coronary atherosclerosis: Previously noted on CT with negative Myoview last year.  No chest pain or dyspnea.  Continue statin therapy.  He should also be taking aspirin 81 mg daily.  3.  Essential hypertension: Blood pressure elevated today.  Increasing lisinopril to 40 mg daily with plan for basic metabolic panel in 1 week.  4.  Hyperlipidemia: LDL of 71 last year.  He had come off of Pravachol and this was recently resumed.  5.  Type 2 diabetes mellitus: On Glucophage with an A1c of 7.8 in May.  Followed by primary care.  6.  Sinus tachycardia: Over the past several years, his rate has generally trended at or just.  Recent CBC was normal.  Above 100.  TSH was normal in November 2020.  He is asymptomatic.  Blood pressure/heart rate still elevated at next visit, would have a low threshold to add beta-blocker.  7.  Disposition: Follow-up ABIs/lower extremity duplex.  Follow-up in 3 months or sooner pending results.   Murray Hodgkins, NP 07/09/2019, 2:18 PM

## 2019-07-09 NOTE — Patient Instructions (Signed)
Medication Instructions:  1- INCREASE Lisinopril Take 1 tablet (40 mg total) by mouth daily *If you need a refill on your cardiac medications before your next appointment, please call your pharmacy*   Lab Work: 1- Your physician recommends that you return for lab work in: 1 week at the medical mall. (BM[facility])No appt is needed. Hours are M-F 7AM- 6 PM.  If you have labs (blood work) drawn today and your tests are completely normal, you will receive your results only by: Marland Kitchen MyChart Message (if you have MyChart) OR . A paper copy in the mail If you have any lab test that is abnormal or we need to change your treatment, we will call you to review the results.   Testing/Procedures: 1- Your physician has requested that you have an ankle brachial index (ABI). During this test an ultrasound and blood pressure cuff are used to evaluate the arteries that supply the arms and legs with blood. Allow thirty minutes for this exam. There are no restrictions or special instructions.  2- Your physician has requested that you have a lower extremity arterial exercise duplex. During this test, exercise and ultrasound are used to evaluate arterial blood flow in the legs. Allow one hour for this exam. There are no restrictions or special instructions.    Follow-Up: At [facility], you and your health needs are our priority.  As part of our continuing mission to provide you with exceptional heart care, we have created designated Provider Care Teams.  These Care Teams include your primary Cardiologist (physician) and Advanced Practice Providers (APPs -  Physician Assistants and Nurse Practitioners) who all work together to provide you with the care you need, when you need it.  We recommend signing up for the patient portal called "MyChart".  Sign up information is provided on this After Visit Summary.  MyChart is used to connect with patients for Virtual Visits (Telemedicine).  Patients are able to view lab/test  results, encounter notes, upcoming appointments, etc.  Non-urgent messages can be sent to your provider as well.   To learn more about what you can do with MyChart, go to [email or web address].    Your next appointment:   6 month(s)  The format for your next appointment:   In Person  Provider:    You may see Ida Rogue, MD or Murray Hodgkins, NP

## 2019-07-10 IMAGING — US US ABDOMEN LIMITED
1 series · 14 of 25 positions shown · non-contrast
Comparison: None.

CLINICAL DATA: Elevated LFTs

EXAM:
ULTRASOUND ABDOMEN LIMITED RIGHT UPPER QUADRANT

[Series 1: us abdomen limited · 0.26mm/px · 14 of 43 slices shown]
[im 1/43]
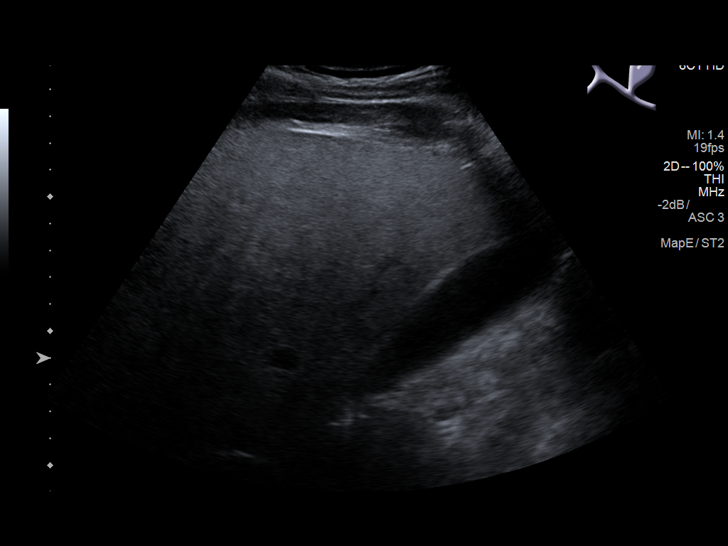
[im 4/43]
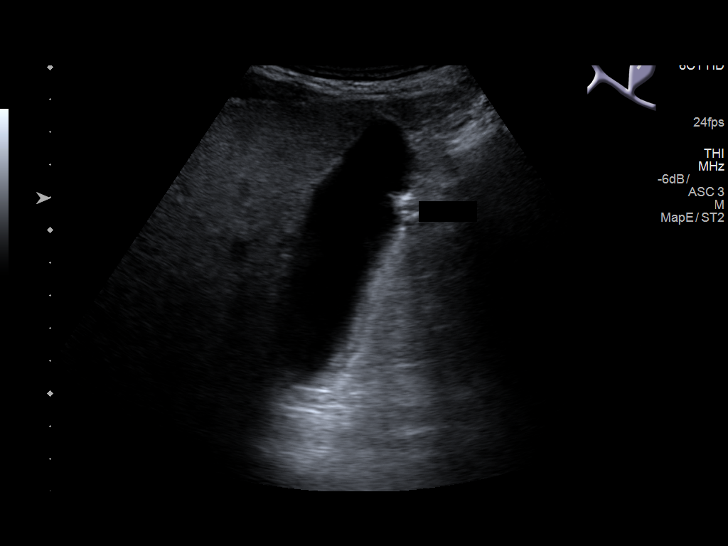
[im 8/43]
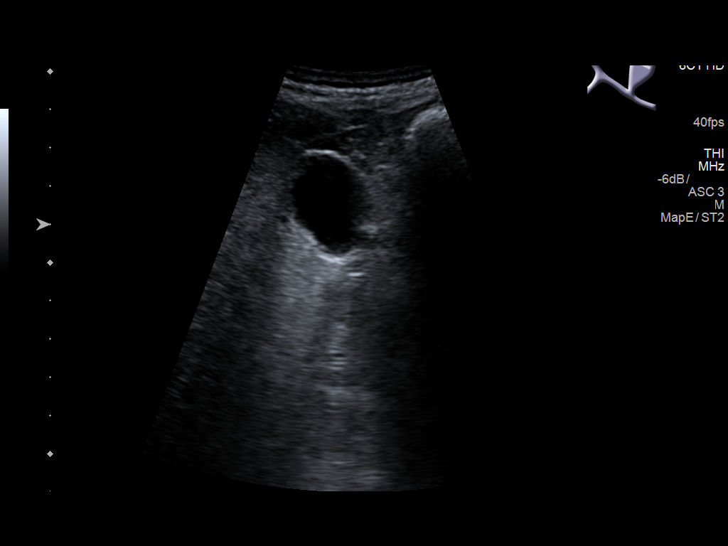
[im 11/43]
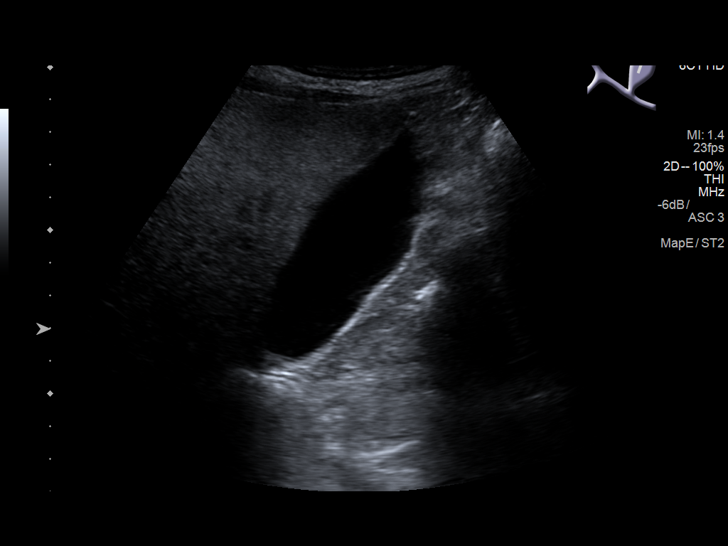
[im 15/43]
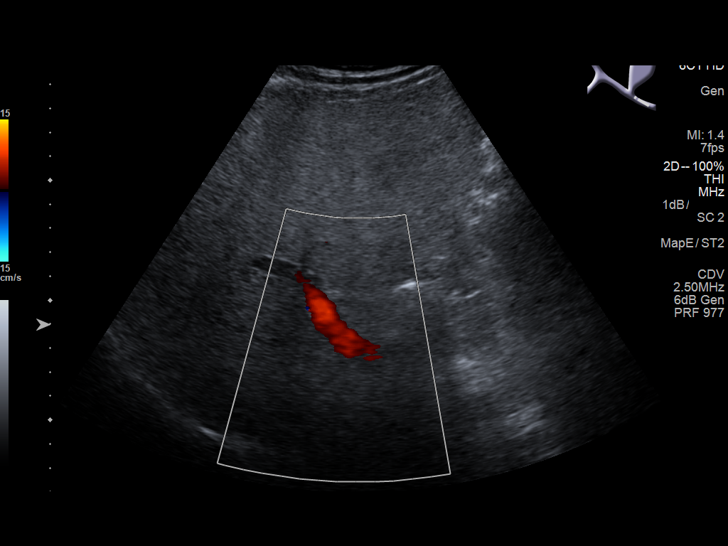
[im 16/43]
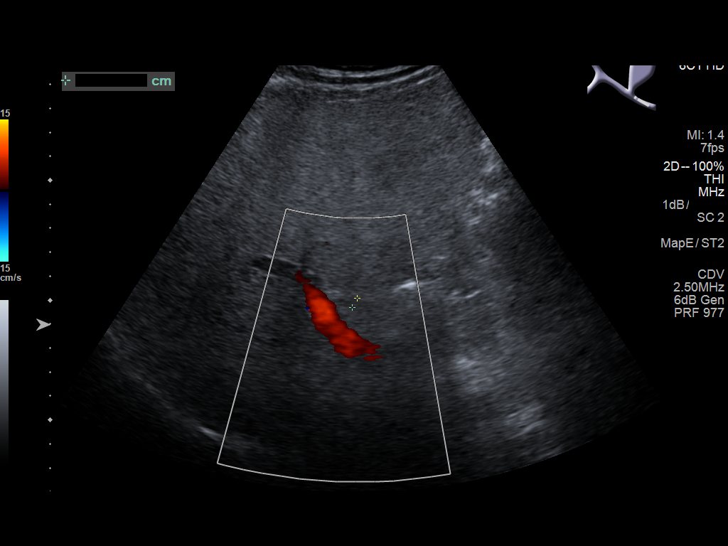
[im 20/43]
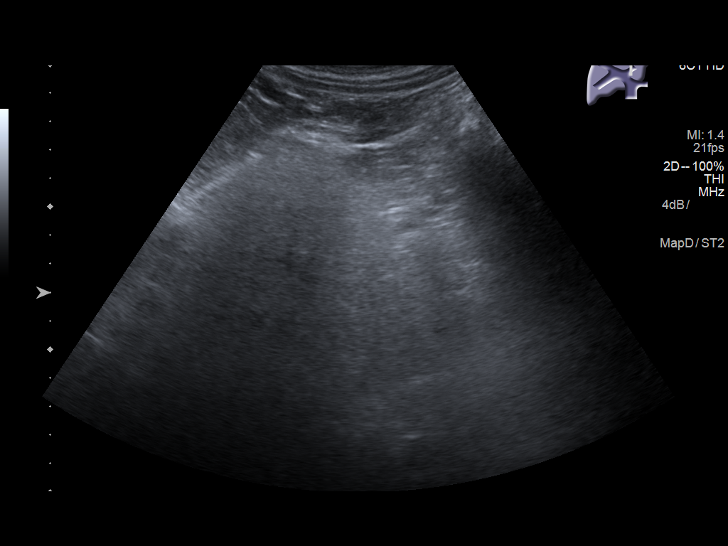
[im 23/43]
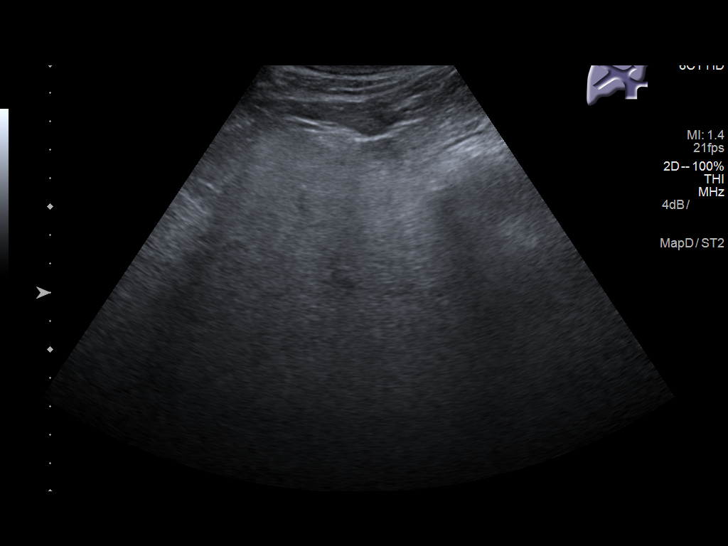
[im 27/43]
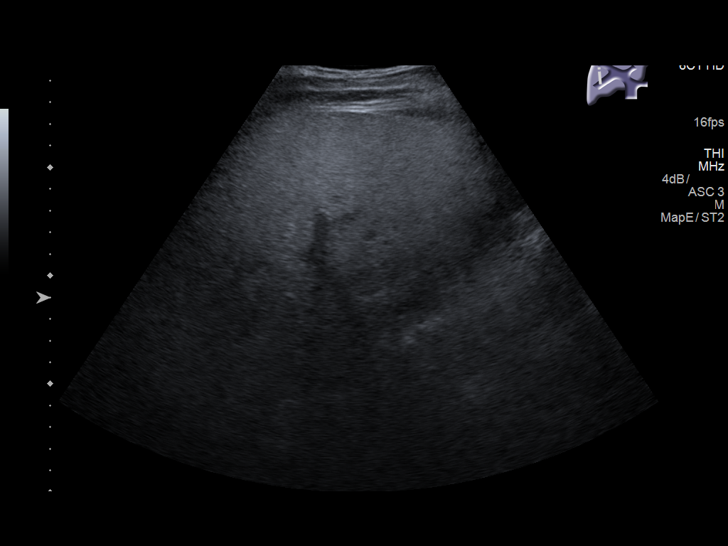
[im 29/43]
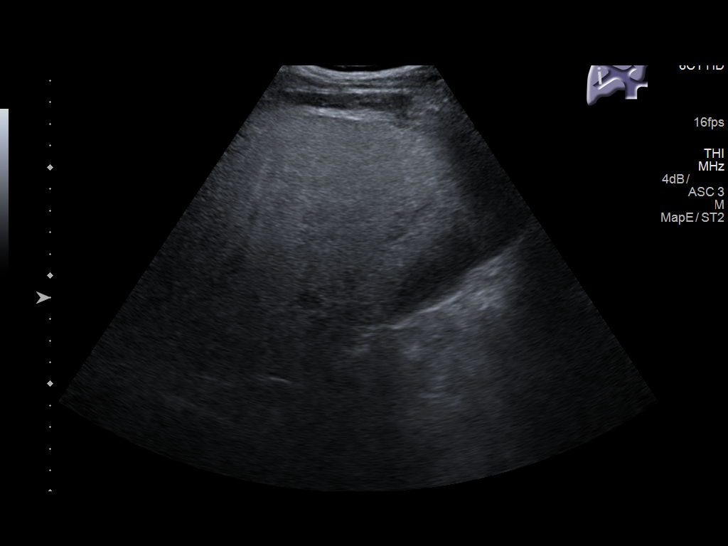
[im 32/43]
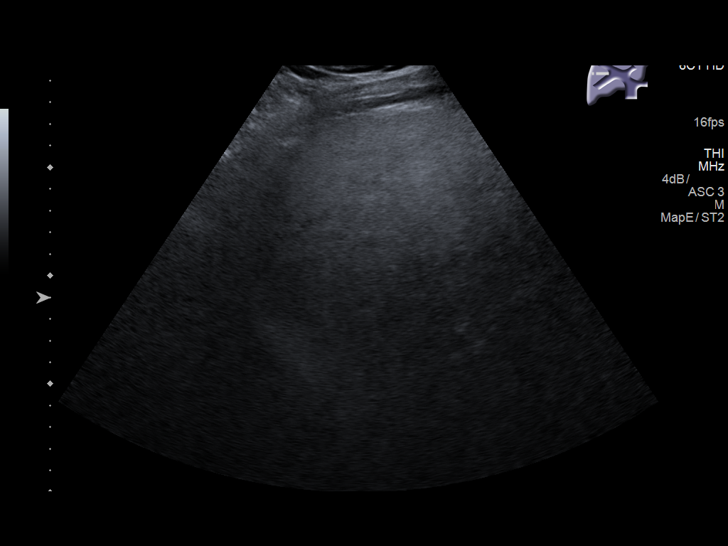
[im 36/43]
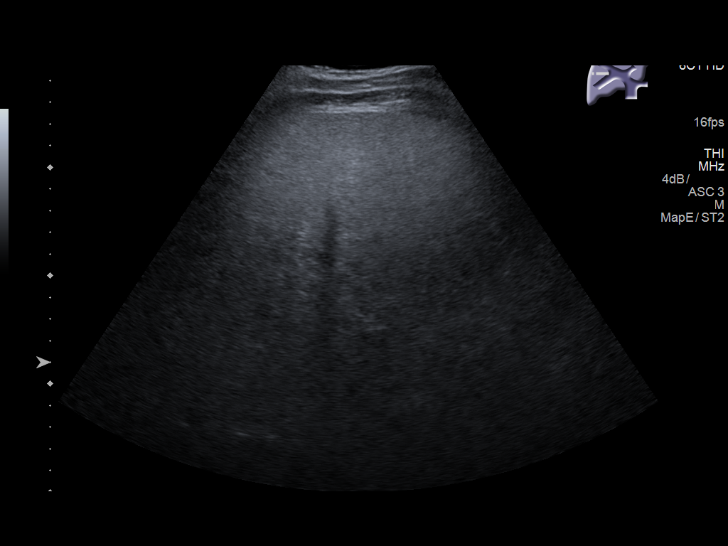
[im 39/43]
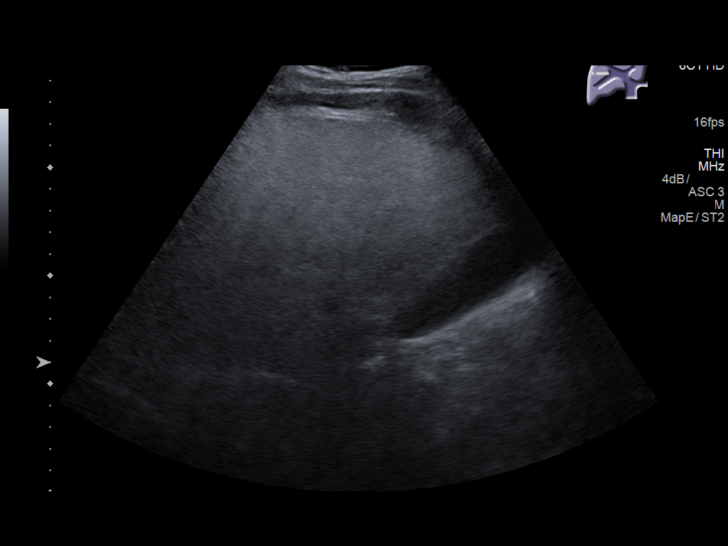
[im 43/43]
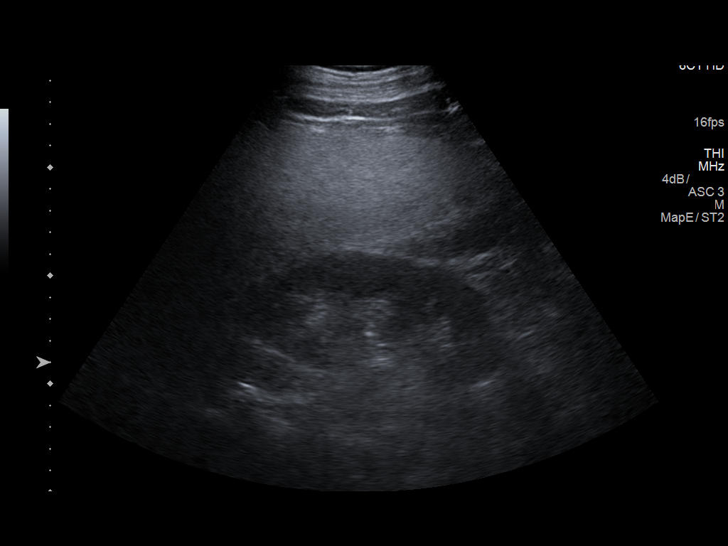

[14 of 25 positions shown; findings below may reference images not displayed]

FINDINGS: Gallbladder:

No gallstones or wall thickening visualized. No sonographic Murphy
sign noted by sonographer.

Common bile duct:

Diameter: Normal caliber, 4 mm

Liver:

Increased echotexture compatible with fatty infiltration. No focal
abnormality or biliary ductal dilatation. Portal vein is patent on
color Doppler imaging with normal direction of blood flow towards
the liver.
IMPRESSION: Fatty infiltration of the liver.

No acute findings.

## 2019-07-14 ENCOUNTER — Other Ambulatory Visit: Payer: Self-pay

## 2019-07-15 ENCOUNTER — Other Ambulatory Visit
Admission: RE | Admit: 2019-07-15 | Discharge: 2019-07-15 | Disposition: A | Discharge status: Home / Self Care | Payer: Medicare Other | Attending: Nurse Practitioner | Admitting: Nurse Practitioner

## 2019-07-15 DIAGNOSIS — I2510 Atherosclerotic heart disease of native coronary artery without angina pectoris: Secondary | ICD-10-CM | POA: Diagnosis not present

## 2019-07-15 LAB — BASIC METABOLIC PANEL
Anion gap: 11 (ref 5–15)
BUN: 35 mg/dL — ABNORMAL HIGH (ref 8–23)
CO2: 27 mmol/L (ref 22–32)
Calcium: 9.3 mg/dL (ref 8.9–10.3)
Chloride: 101 mmol/L (ref 98–111)
Creatinine, Ser: 1.02 mg/dL (ref 0.61–1.24)
GFR calc Af Amer: >60 mL/min (ref >60)
GFR calc non Af Amer: >60 mL/min (ref >60)
Glucose, Bld: 88 mg/dL (ref 70–99)
Potassium: 4.1 mmol/L (ref 3.5–5.1)
Sodium: 139 mmol/L (ref 135–145)

## 2019-07-20 DIAGNOSIS — Z794 Long term (current) use of insulin: Secondary | ICD-10-CM | POA: Diagnosis not present

## 2019-07-20 DIAGNOSIS — E119 Type 2 diabetes mellitus without complications: Secondary | ICD-10-CM | POA: Diagnosis not present

## 2019-07-25 ENCOUNTER — Telehealth: Payer: Self-pay | Admitting: *Deleted

## 2019-07-25 DIAGNOSIS — Z122 Encounter for screening for malignant neoplasm of respiratory organs: Secondary | ICD-10-CM

## 2019-07-25 DIAGNOSIS — Z87891 Personal history of nicotine dependence: Secondary | ICD-10-CM

## 2019-07-25 NOTE — Telephone Encounter (Signed)
Patient was notified that it is time for his Low Dose Cancer Screenign Ct after 07/28/19. Confirmed patient is the right age range.He is not currently smoking, quit 6 years ago. He has had no major health events in the past year. Patient had [place] Covid injs 03/30/19 and 04/21/19 and no changes in his insurance [employer]/ [employer]. No preferences in scheduling.

## 2019-07-28 NOTE — Telephone Encounter (Signed)
Smoking history: former, quit 2014, 41 pack year

## 2019-07-28 NOTE — Addendum Note (Signed)
Addended by: Lieutenant Diego on: 07/28/2019 04:49 PM   Modules accepted: Orders

## 2019-08-06 ENCOUNTER — Ambulatory Visit
Admission: RE | Admit: 2019-08-06 | Discharge: 2019-08-06 | Disposition: A | Discharge status: Home / Self Care | Payer: Medicare Other | Source: Ambulatory Visit | Attending: Oncology | Admitting: Oncology

## 2019-08-06 ENCOUNTER — Other Ambulatory Visit: Payer: Self-pay

## 2019-08-06 DIAGNOSIS — Z87891 Personal history of nicotine dependence: Secondary | ICD-10-CM | POA: Diagnosis not present

## 2019-08-06 DIAGNOSIS — Z122 Encounter for screening for malignant neoplasm of respiratory organs: Secondary | ICD-10-CM | POA: Diagnosis not present

## 2019-08-11 ENCOUNTER — Ambulatory Visit (INDEPENDENT_AMBULATORY_CARE_PROVIDER_SITE_OTHER): Payer: Medicare Other

## 2019-08-11 ENCOUNTER — Encounter: Payer: Self-pay | Admitting: *Deleted

## 2019-08-11 ENCOUNTER — Other Ambulatory Visit: Payer: Self-pay

## 2019-08-11 DIAGNOSIS — I739 Peripheral vascular disease, unspecified: Secondary | ICD-10-CM | POA: Diagnosis not present

## 2019-08-11 DIAGNOSIS — H524 Presbyopia: Secondary | ICD-10-CM | POA: Diagnosis not present

## 2019-08-11 DIAGNOSIS — H2513 Age-related nuclear cataract, bilateral: Secondary | ICD-10-CM | POA: Diagnosis not present

## 2019-08-11 DIAGNOSIS — H5203 Hypermetropia, bilateral: Secondary | ICD-10-CM | POA: Diagnosis not present

## 2019-08-11 DIAGNOSIS — E119 Type 2 diabetes mellitus without complications: Secondary | ICD-10-CM | POA: Diagnosis not present

## 2019-08-11 DIAGNOSIS — H401131 Primary open-angle glaucoma, bilateral, mild stage: Secondary | ICD-10-CM | POA: Diagnosis not present

## 2019-08-11 LAB — HM DIABETES EYE EXAM

## 2019-08-12 ENCOUNTER — Telehealth: Payer: Self-pay

## 2019-08-12 DIAGNOSIS — I739 Peripheral vascular disease, unspecified: Secondary | ICD-10-CM

## 2019-08-12 NOTE — Telephone Encounter (Signed)
-----   Message from Theora Gianotti, NP sent at 08/12/2019  6:15 AM EDT ----- Both the  lower ext arterial duplex and ABI suggest left sided iliac and lower extremity disease.  This likely explains his left hip/buttock symptoms with activity.  Pls arrange for a PV consult with Dr. Fletcher Anon as soon as feasible for [name].

## 2019-08-12 NOTE — Telephone Encounter (Signed)
Call to patient to review LE u/s results.    Pt verbalized understanding and has no further questions at this time.    Advised pt to call for any further questions or concerns.   Routing to scheduling for consult with Dr. Fletcher Anon.

## 2019-08-14 ENCOUNTER — Ambulatory Visit (INDEPENDENT_AMBULATORY_CARE_PROVIDER_SITE_OTHER): Payer: Medicare Other | Admitting: Cardiovascular Disease

## 2019-08-14 ENCOUNTER — Other Ambulatory Visit: Payer: Self-pay

## 2019-08-14 ENCOUNTER — Encounter: Payer: Self-pay | Admitting: Cardiovascular Disease

## 2019-08-14 VITALS — BP 144/70 | Temp 92.0°F | Ht 70.0 in | Wt 214.2 lb

## 2019-08-14 DIAGNOSIS — I10 Essential (primary) hypertension: Secondary | ICD-10-CM

## 2019-08-14 DIAGNOSIS — I739 Peripheral vascular disease, unspecified: Secondary | ICD-10-CM | POA: Diagnosis not present

## 2019-08-14 DIAGNOSIS — I2510 Atherosclerotic heart disease of native coronary artery without angina pectoris: Secondary | ICD-10-CM

## 2019-08-14 DIAGNOSIS — E785 Hyperlipidemia, unspecified: Secondary | ICD-10-CM | POA: Diagnosis not present

## 2019-08-14 MED ORDER — ASPIRIN EC 81 MG PO TBEC
81.0000 mg | DELAYED_RELEASE_TABLET | Freq: Every day | ORAL | 3 refills | Status: DC
Start: 2019-08-14 — End: 2020-09-28

## 2019-08-14 NOTE — H&P (View-Only) (Signed)
Cardiology Office Note   Date:  08/14/2019   ID:  Anthony Cuevas, DOB February 18, 1950, MRN [id number]  PCP:  Tonia Ghent, MD  Cardiologist:  Dr. Rockey Situ  Chief Complaint  Patient presents with  . OTHER    Discuss LE claudication. Meds reviewed verbally with pt.      History of Present Illness: Anthony Cuevas is a 69 y.o. male who was referred by Dr. Damita Dunnings and Ignacia Bayley for evaluation management of peripheral arterial disease.  He has known history of essential hypertension, hyperlipidemia, diabetes mellitus, remote tobacco use and coronary atherosclerosis noted on previous CT scan. He underwent stress testing in July 2020 which showed no evidence of ischemia or infarct. The patient reports left hip, thigh and calf discomfort with exertion which started about 1 year ago.  Symptoms have been getting worse.  He gets the symptoms with minimal exertion after walking less than half a block.  Occasionally, he has to stop and rest for few minutes before he can resume.  In addition, he has significant left foot numbness at rest when he is sitting in certain positions.  He has no lower extremity ulceration. He underwent noninvasive vascular studies which showed an ABI of 0.99 on the right and 0.54 in the left.  Duplex showed moderate right iliac disease with peak velocity of 262.  On the left, the common iliac artery was occluded proximally into the mid segment with no evidence of infrainguinal disease with the exception of occluded anterior tibial artery. He denies chest pain or shortness of breath.  Past Medical History:  Diagnosis Date  . Arthritis   . Coronary atherosclerosis    a. 07/2018 CT Chest: 3 vessel coronary atherosclerosis; b. 07/2018 MV: EF 46% (GI uptake artifact), No ischemia/infarct. Mild to mod diffuse Ao atherosclerosis and at least moderate 2 vessel CAD (LAD/RCA) on CT imaging. Low risk.  . Depression    improved on sertraline  . Diabetes mellitus, type 2 ([place])  09/1996  . GERD (gastroesophageal reflux disease) 1990  . History of peptic ulcer disease   . Hyperlipidemia 1992  . Hypertension 05/2003  . Lumbar stenosis L2-3  . Muscle atrophy    in arms from degenerative changes in neck  . Neck pain    chronic  . Smoker   . Wears glasses     Past Surgical History:  Procedure Laterality Date  . CERVICAL DISC SURGERY  04/19/02   fusion, Dr. Lorin Mercy  . CERVICAL DISCECTOMY  1997   partial  . CERVICAL DISCECTOMY  2000  . COLONOSCOPY WITH ESOPHAGOGASTRODUODENOSCOPY (EGD)  06/25/2015  . [place]  . KNEE ARTHROPLASTY  11/20/2011   Procedure: COMPUTER ASSISTED TOTAL KNEE ARTHROPLASTY;  Surgeon: Marybelle Killings, MD;  Location: [place];  Service: Orthopedics;  Laterality: Left;  Conversion Left Knee Medial Uni to Total Knee Arthroplasty-Cemented  . LUMBAR LAMINECTOMY/DECOMPRESSION MICRODISCECTOMY N/A 04/28/2015   Procedure: L2-3 Decompression;  Surgeon: Marybelle Killings, MD;  Location: [place];  Service: Orthopedics;  Laterality: N/A;  . MR MRA DUPLICATE EXAM  INACTIVATE    . MULTIPLE TOOTH EXTRACTIONS    . Neisen funduplication  7425  . REPLACEMENT TOTAL KNEE  02/14/02, 05/04/02   left- partial  . SCC EXCISION  04/25/01   Dr. March Rummage  . STOMACH SURGERY  1984   PUD, HH     Current Outpatient Medications  Medication Sig Dispense Refill  . celecoxib (CELEBREX) 200 MG capsule TAKE 1 CAPSULE DAILY 90  capsule 3  . cholecalciferol (VITAMIN D) 1000 UNITS tablet Take 5,000 Units by mouth 2 (two) times a week.    . Continuous Blood Gluc Sensor (FREESTYLE LIBRE 14 DAY SENSOR) MISC 1 Device by Does not apply route every 14 (fourteen) days. 6 each 3  . cyanocobalamin 1000 MCG tablet Take 1,000 mcg by mouth daily.    . cyclobenzaprine (FLEXERIL) 10 MG tablet TAKE 1 TABLET TWICE A DAY AS NEEDED FOR MUSCLE SPASMS (SEDATION CAUTION) 180 tablet 1  . Insulin NPH, Human,, Isophane, (NOVOLIN N FLEXPEN) 100 UNIT/ML Kiwkpen 170 units each morning, and 30 units each  evening, and pen needles 3/day 55 pen 3  . Insulin Pen Needle (SURE COMFORT PEN NEEDLES) 32G X 4 MM MISC Use to inject insulin BID; E11.65 100 each 2  . lisinopril (ZESTRIL) 40 MG tablet Take 1 tablet (40 mg total) by mouth daily. 90 tablet 3  . LUMIGAN 0.01 % SOLN Place 1 drop into both eyes at bedtime.     . metFORMIN (GLUCOPHAGE) 1000 MG tablet TAKE 1 TABLET TWICE A DAY WITH MEALS 180 tablet 3  . niacin (NIASPAN) 500 MG CR tablet TAKE 1 TABLET AT BEDTIME 90 tablet 2  . Omega-3 Fatty Acids (FISH OIL PO) Take 1,000 mg by mouth once a week.    Glory Rosebush ULTRA test strip USE AS INSTRUCTED TO TEST BLOOD SUGARS 3 OR 4 TIMES DAILY 100 each 11  . pravastatin (PRAVACHOL) 80 MG tablet TAKE 1 TABLET DAILY 90 tablet 3  . traMADol (ULTRAM) 50 MG tablet TAKE 1 TABLET EVERY 8 HOURS AS NEEDED FOR CHRONIC JOINT/BACK PAIN (SEDATION CAUTION) 270 tablet 1   No current facility-administered medications for this visit.    Allergies:   Patient has no known allergies.    Social History:  The patient  reports that he quit smoking about 7 years ago. His smoking use included cigarettes. He has a 41.00 pack-year smoking history. He has never used smokeless tobacco. He reports that he does not drink alcohol and does not use drugs.   Family History:  The patient's family history includes Breast cancer in his maternal grandmother; Diabetes in his brother, brother, father, mother, and sister; Heart disease in his brother; Hypertension in his mother; Lung disease in his paternal grandfather; Stroke in his mother.    ROS:  Please see the history of present illness.   Otherwise, review of systems are positive for none.   All other systems are reviewed and negative.    PHYSICAL EXAM: VS:  BP (!) 144/70 (BP Location: Left Arm, Patient Position: Sitting, Cuff Size: Normal)   Temp (!) 92 F (33.3 C)   Ht 5\' 10"  (1.778 m)   Wt 214 lb 4 oz (97.2 kg)   SpO2 99%   BMI 30.74 kg/m  , BMI Body mass index is 30.74  kg/m. GEN: Well nourished, well developed, in no acute distress  HEENT: normal  Neck: no JVD, carotid bruits, or masses Cardiac: RRR; no murmurs, rubs, or gallops,no edema  Respiratory:  clear to auscultation bilaterally, normal work of breathing GI: soft, nontender, nondistended, + BS MS: no deformity or atrophy  Skin: warm and dry, no rash Neuro:  Strength and sensation are intact Psych: euthymic mood, full affect Vascular: Radial pulses normal bilaterally.  Femoral pulses normal on the right side and absent on the left.  Distal pulses are palpable only on the right.   EKG:  EKG is ordered today. The ekg ordered today demonstrates  normal sinus rhythm with no significant ST or T wave changes.   Recent Labs: 12/04/2018: ALT 37; TSH 4.35 07/03/2019: Hemoglobin 14.1; Platelets 177.0 07/15/2019: BUN 35; Creatinine, Ser 1.02; Potassium 4.1; Sodium 139    Lipid Panel    Component Value Date/Time   CHOL 144 12/04/2018 0952   TRIG 254.0 (H) 12/04/2018 0952   HDL 31.00 (L) 12/04/2018 0952   CHOLHDL 5 12/04/2018 0952   VLDL 50.8 (H) 12/04/2018 0952   LDLCALC 68 07/13/2014 0814   LDLDIRECT 71.0 12/04/2018 0952      Wt Readings from Last 3 Encounters:  08/14/19 214 lb 4 oz (97.2 kg)  08/06/19 212 lb (96.2 kg)  07/09/19 217 lb 2 oz (98.5 kg)      No flowsheet data found.    ASSESSMENT AND PLAN:  1.  Peripheral arterial disease: Severe left leg claudication Rutherford class III due to occluded left common iliac artery.  The patient's symptoms have been gradually getting worse and his symptoms are clearly lifestyle limiting.  I discussed the natural history and management of claudication.  Given severity of his symptoms, I recommend proceeding with abdominal aortogram with lower extremity runoff and possible endovascular intervention.  I discussed the procedure in details as well as risks and benefits.  Planned access is via the right common femoral artery.    2.  Coronary  atherosclerosis: Negative stress testing last year.  Currently with no anginal symptoms.  3.  Essential hypertension:  Blood pressure is reasonably controlled.    4.  Hyperlipidemia: Currently on high-dose pravastatin.  Most recent lipid profile showed an LDL of 71.      Disposition:   FU with me in 1 month  Signed,  Kathlyn Sacramento, MD  08/14/2019 10:56 AM    [place]

## 2019-08-14 NOTE — Patient Instructions (Signed)
Medication Instructions:  Your physician has recommended you make the following change in your medication:  1. START Aspirin 81 mg once daily   *If you need a refill on your cardiac medications before your next appointment, please call your pharmacy*   Lab Work: CBC, BMP today  If you have labs (blood work) drawn today and your tests are completely normal, you will receive your results only by: Marland Kitchen MyChart Message (if you have MyChart) OR . A paper copy in the mail If you have any lab test that is abnormal or we need to change your treatment, we will call you to review the results.   Testing/Procedures:    [address], [place] [address] Dept: [phone] Loc: [phone]  YOEL KAUFHOLD  08/14/2019  You are scheduled for a Peripheral Angiogram on Wednesday, August 4 with Dr. Kathlyn Sacramento.  1. Please arrive at the [facility] (Main Entrance A) at [facility]: [address] [place], [place] [address] at 6:30 AM (This time is two hours before your procedure to ensure your preparation). Free valet parking service is available.   Special note: Every effort is made to have your procedure done on time. Please understand that emergencies sometimes delay scheduled procedures.  2. Diet: Do not eat solid foods after midnight.  The patient may have clear liquids until 5am upon the day of the procedure.   4. Medication instructions in preparation for your procedure:   Contrast Allergy: No  Take only 15 units of insulin the night before your procedure. Do not take any insulin on the day of the procedure.  Do not take Diabetes Med Glucophage (Metformin) on the day of the procedure and HOLD 48 HOURS AFTER THE PROCEDURE.  On the morning of your procedure, take your Aspirin and any morning medicines NOT listed above.  You may use sips of water.  5. Plan for one night  stay--bring personal belongings. 6. Bring a current list of your medications and current insurance cards. 7. You MUST have a responsible person to drive you home. 8. Someone MUST be with you the first 24 hours after you arrive home or your discharge will be delayed. 9. Please wear clothes that are easy to get on and off and wear slip-on shoes.  Thank you for allowing [place] to care for you!   -- [facility]  Follow-Up: At [facility], you and your health needs are our priority.  As part of our continuing mission to provide you with exceptional heart care, we have created designated Provider Care Teams.  These Care Teams include your primary Cardiologist (physician) and Advanced Practice Providers (APPs -  Physician Assistants and Nurse Practitioners) who all work together to provide you with the care you need, when you need it.  Your next appointment:   2 week(s) after procedure.   The format for your next appointment:   In Person  Provider:   Kathlyn Sacramento, MD

## 2019-08-14 NOTE — Progress Notes (Signed)
Cardiology Office Note   Date:  08/14/2019   ID:  Anthony Cuevas, DOB 12-Sep-1950, MRN [id number]  PCP:  Tonia Ghent, MD  Cardiologist:  Dr. Rockey Situ  Chief Complaint  Patient presents with  . OTHER    Discuss LE claudication. Meds reviewed verbally with pt.      History of Present Illness: Anthony Cuevas is a 69 y.o. male who was referred by Dr. Damita Dunnings and Ignacia Bayley for evaluation management of peripheral arterial disease.  He has known history of essential hypertension, hyperlipidemia, diabetes mellitus, remote tobacco use and coronary atherosclerosis noted on previous CT scan. He underwent stress testing in July 2020 which showed no evidence of ischemia or infarct. The patient reports left hip, thigh and calf discomfort with exertion which started about 1 year ago.  Symptoms have been getting worse.  He gets the symptoms with minimal exertion after walking less than half a block.  Occasionally, he has to stop and rest for few minutes before he can resume.  In addition, he has significant left foot numbness at rest when he is sitting in certain positions.  He has no lower extremity ulceration. He underwent noninvasive vascular studies which showed an ABI of 0.99 on the right and 0.54 in the left.  Duplex showed moderate right iliac disease with peak velocity of 262.  On the left, the common iliac artery was occluded proximally into the mid segment with no evidence of infrainguinal disease with the exception of occluded anterior tibial artery. He denies chest pain or shortness of breath.  Past Medical History:  Diagnosis Date  . Arthritis   . Coronary atherosclerosis    a. 07/2018 CT Chest: 3 vessel coronary atherosclerosis; b. 07/2018 MV: EF 46% (GI uptake artifact), No ischemia/infarct. Mild to mod diffuse Ao atherosclerosis and at least moderate 2 vessel CAD (LAD/RCA) on CT imaging. Low risk.  . Depression    improved on sertraline  . Diabetes mellitus, type 2 ([place])  09/1996  . GERD (gastroesophageal reflux disease) 1990  . History of peptic ulcer disease   . Hyperlipidemia 1992  . Hypertension 05/2003  . Lumbar stenosis L2-3  . Muscle atrophy    in arms from degenerative changes in neck  . Neck pain    chronic  . Smoker   . Wears glasses     Past Surgical History:  Procedure Laterality Date  . CERVICAL DISC SURGERY  04/19/02   fusion, Dr. Lorin Mercy  . CERVICAL DISCECTOMY  1997   partial  . CERVICAL DISCECTOMY  2000  . COLONOSCOPY WITH ESOPHAGOGASTRODUODENOSCOPY (EGD)  06/25/2015  . [place]  . KNEE ARTHROPLASTY  11/20/2011   Procedure: COMPUTER ASSISTED TOTAL KNEE ARTHROPLASTY;  Surgeon: Marybelle Killings, MD;  Location: [place];  Service: Orthopedics;  Laterality: Left;  Conversion Left Knee Medial Uni to Total Knee Arthroplasty-Cemented  . LUMBAR LAMINECTOMY/DECOMPRESSION MICRODISCECTOMY N/A 04/28/2015   Procedure: L2-3 Decompression;  Surgeon: Marybelle Killings, MD;  Location: [place];  Service: Orthopedics;  Laterality: N/A;  . MR MRA DUPLICATE EXAM  INACTIVATE    . MULTIPLE TOOTH EXTRACTIONS    . Neisen funduplication  4562  . REPLACEMENT TOTAL KNEE  02/14/02, 05/04/02   left- partial  . SCC EXCISION  04/25/01   Dr. March Rummage  . STOMACH SURGERY  1984   PUD, HH     Current Outpatient Medications  Medication Sig Dispense Refill  . celecoxib (CELEBREX) 200 MG capsule TAKE 1 CAPSULE DAILY 90  capsule 3  . cholecalciferol (VITAMIN D) 1000 UNITS tablet Take 5,000 Units by mouth 2 (two) times a week.    . Continuous Blood Gluc Sensor (FREESTYLE LIBRE 14 DAY SENSOR) MISC 1 Device by Does not apply route every 14 (fourteen) days. 6 each 3  . cyanocobalamin 1000 MCG tablet Take 1,000 mcg by mouth daily.    . cyclobenzaprine (FLEXERIL) 10 MG tablet TAKE 1 TABLET TWICE A DAY AS NEEDED FOR MUSCLE SPASMS (SEDATION CAUTION) 180 tablet 1  . Insulin NPH, Human,, Isophane, (NOVOLIN N FLEXPEN) 100 UNIT/ML Kiwkpen 170 units each morning, and 30 units each  evening, and pen needles 3/day 55 pen 3  . Insulin Pen Needle (SURE COMFORT PEN NEEDLES) 32G X 4 MM MISC Use to inject insulin BID; E11.65 100 each 2  . lisinopril (ZESTRIL) 40 MG tablet Take 1 tablet (40 mg total) by mouth daily. 90 tablet 3  . LUMIGAN 0.01 % SOLN Place 1 drop into both eyes at bedtime.     . metFORMIN (GLUCOPHAGE) 1000 MG tablet TAKE 1 TABLET TWICE A DAY WITH MEALS 180 tablet 3  . niacin (NIASPAN) 500 MG CR tablet TAKE 1 TABLET AT BEDTIME 90 tablet 2  . Omega-3 Fatty Acids (FISH OIL PO) Take 1,000 mg by mouth once a week.    Glory Rosebush ULTRA test strip USE AS INSTRUCTED TO TEST BLOOD SUGARS 3 OR 4 TIMES DAILY 100 each 11  . pravastatin (PRAVACHOL) 80 MG tablet TAKE 1 TABLET DAILY 90 tablet 3  . traMADol (ULTRAM) 50 MG tablet TAKE 1 TABLET EVERY 8 HOURS AS NEEDED FOR CHRONIC JOINT/BACK PAIN (SEDATION CAUTION) 270 tablet 1   No current facility-administered medications for this visit.    Allergies:   Patient has no known allergies.    Social History:  The patient  reports that he quit smoking about 7 years ago. His smoking use included cigarettes. He has a 41.00 pack-year smoking history. He has never used smokeless tobacco. He reports that he does not drink alcohol and does not use drugs.   Family History:  The patient's family history includes Breast cancer in his maternal grandmother; Diabetes in his brother, brother, father, mother, and sister; Heart disease in his brother; Hypertension in his mother; Lung disease in his paternal grandfather; Stroke in his mother.    ROS:  Please see the history of present illness.   Otherwise, review of systems are positive for none.   All other systems are reviewed and negative.    PHYSICAL EXAM: VS:  BP (!) 144/70 (BP Location: Left Arm, Patient Position: Sitting, Cuff Size: Normal)   Temp (!) 92 F (33.3 C)   Ht 5\' 10"  (1.778 m)   Wt 214 lb 4 oz (97.2 kg)   SpO2 99%   BMI 30.74 kg/m  , BMI Body mass index is 30.74  kg/m. GEN: Well nourished, well developed, in no acute distress  HEENT: normal  Neck: no JVD, carotid bruits, or masses Cardiac: RRR; no murmurs, rubs, or gallops,no edema  Respiratory:  clear to auscultation bilaterally, normal work of breathing GI: soft, nontender, nondistended, + BS MS: no deformity or atrophy  Skin: warm and dry, no rash Neuro:  Strength and sensation are intact Psych: euthymic mood, full affect Vascular: Radial pulses normal bilaterally.  Femoral pulses normal on the right side and absent on the left.  Distal pulses are palpable only on the right.   EKG:  EKG is ordered today. The ekg ordered today demonstrates  normal sinus rhythm with no significant ST or T wave changes.   Recent Labs: 12/04/2018: ALT 37; TSH 4.35 07/03/2019: Hemoglobin 14.1; Platelets 177.0 07/15/2019: BUN 35; Creatinine, Ser 1.02; Potassium 4.1; Sodium 139    Lipid Panel    Component Value Date/Time   CHOL 144 12/04/2018 0952   TRIG 254.0 (H) 12/04/2018 0952   HDL 31.00 (L) 12/04/2018 0952   CHOLHDL 5 12/04/2018 0952   VLDL 50.8 (H) 12/04/2018 0952   LDLCALC 68 07/13/2014 0814   LDLDIRECT 71.0 12/04/2018 0952      Wt Readings from Last 3 Encounters:  08/14/19 214 lb 4 oz (97.2 kg)  08/06/19 212 lb (96.2 kg)  07/09/19 217 lb 2 oz (98.5 kg)      No flowsheet data found.    ASSESSMENT AND PLAN:  1.  Peripheral arterial disease: Severe left leg claudication Rutherford class III due to occluded left common iliac artery.  The patient's symptoms have been gradually getting worse and his symptoms are clearly lifestyle limiting.  I discussed the natural history and management of claudication.  Given severity of his symptoms, I recommend proceeding with abdominal aortogram with lower extremity runoff and possible endovascular intervention.  I discussed the procedure in details as well as risks and benefits.  Planned access is via the right common femoral artery.    2.  Coronary  atherosclerosis: Negative stress testing last year.  Currently with no anginal symptoms.  3.  Essential hypertension:  Blood pressure is reasonably controlled.    4.  Hyperlipidemia: Currently on high-dose pravastatin.  Most recent lipid profile showed an LDL of 71.      Disposition:   FU with me in 1 month  Signed,  Kathlyn Sacramento, MD  08/14/2019 10:56 AM    [place]

## 2019-08-15 LAB — CBC
Hematocrit: 39.4 % (ref 37.5–51.0)
Hemoglobin: 13.6 g/dL (ref 13.0–17.7)
MCH: 33.8 pg — ABNORMAL HIGH (ref 26.6–33.0)
MCHC: 34.5 g/dL (ref 31.5–35.7)
MCV: 98 fL — ABNORMAL HIGH (ref 79–97)
Platelets: 148 10*3/uL — ABNORMAL LOW (ref 150–450)
RBC: 4.02 x10E6/uL — ABNORMAL LOW (ref 4.14–5.80)
RDW: 12.4 % (ref 11.6–15.4)
WBC: 7 10*3/uL (ref 3.4–10.8)

## 2019-08-15 LAB — BASIC METABOLIC PANEL
BUN/Creatinine Ratio: 26 — ABNORMAL HIGH (ref 10–24)
BUN: 31 mg/dL — ABNORMAL HIGH (ref 8–27)
CO2: 22 mmol/L (ref 20–29)
Calcium: 9.5 mg/dL (ref 8.6–10.2)
Chloride: 103 mmol/L (ref 96–106)
Creatinine, Ser: 1.18 mg/dL (ref 0.76–1.27)
GFR calc Af Amer: 73 mL/min/{1.73_m2} (ref >59)
GFR calc non Af Amer: 63 mL/min/{1.73_m2} (ref >59)
Glucose: 55 mg/dL — ABNORMAL LOW (ref 65–99)
Potassium: 4.8 mmol/L (ref 3.5–5.2)
Sodium: 142 mmol/L (ref 134–144)

## 2019-08-17 ENCOUNTER — Other Ambulatory Visit: Payer: Self-pay | Admitting: Family Medicine

## 2019-08-17 DIAGNOSIS — D696 Thrombocytopenia, unspecified: Secondary | ICD-10-CM

## 2019-08-19 ENCOUNTER — Other Ambulatory Visit: Payer: Self-pay

## 2019-08-19 ENCOUNTER — Other Ambulatory Visit (INDEPENDENT_AMBULATORY_CARE_PROVIDER_SITE_OTHER): Payer: Medicare Other

## 2019-08-19 DIAGNOSIS — Z794 Long term (current) use of insulin: Secondary | ICD-10-CM | POA: Diagnosis not present

## 2019-08-19 DIAGNOSIS — D696 Thrombocytopenia, unspecified: Secondary | ICD-10-CM

## 2019-08-19 DIAGNOSIS — E119 Type 2 diabetes mellitus without complications: Secondary | ICD-10-CM | POA: Diagnosis not present

## 2019-08-19 LAB — CBC WITH DIFFERENTIAL/PLATELET
Basophils Absolute: 0 10*3/uL (ref 0.0–0.1)
Basophils Relative: 0.6 % (ref 0.0–3.0)
Eosinophils Absolute: 0.2 10*3/uL (ref 0.0–0.7)
Eosinophils Relative: 3.2 % (ref 0.0–5.0)
HCT: 38.2 % — ABNORMAL LOW (ref 39.0–52.0)
Hemoglobin: 13.3 g/dL (ref 13.0–17.0)
Lymphocytes Relative: 27.1 % (ref 12.0–46.0)
Lymphs Abs: 1.5 10*3/uL (ref 0.7–4.0)
MCHC: 34.8 g/dL (ref 30.0–36.0)
MCV: 96.7 fl (ref 78.0–100.0)
Monocytes Absolute: 0.7 10*3/uL (ref 0.1–1.0)
Monocytes Relative: 12.1 % — ABNORMAL HIGH (ref 3.0–12.0)
Neutro Abs: 3.1 10*3/uL (ref 1.4–7.7)
Neutrophils Relative %: 57 % (ref 43.0–77.0)
Platelets: 152 10*3/uL (ref 150.0–400.0)
RBC: 3.95 Mil/uL — ABNORMAL LOW (ref 4.22–5.81)
RDW: 12.5 % (ref 11.5–15.5)
WBC: 5.4 10*3/uL (ref 4.0–10.5)

## 2019-08-19 LAB — COMPREHENSIVE METABOLIC PANEL
ALT: 40 U/L (ref 0–53)
AST: 31 U/L (ref 0–37)
Albumin: 4.2 g/dL (ref 3.5–5.2)
Alkaline Phosphatase: 59 U/L (ref 39–117)
BUN: 20 mg/dL (ref 6–23)
CO2: 29 mEq/L (ref 19–32)
Calcium: 9.5 mg/dL (ref 8.4–10.5)
Chloride: 102 mEq/L (ref 96–112)
Creatinine, Ser: 1.14 mg/dL (ref 0.40–1.50)
GFR: 63.95 mL/min (ref >60.00)
Glucose, Bld: 264 mg/dL — ABNORMAL HIGH (ref 70–99)
Potassium: 4.8 mEq/L (ref 3.5–5.1)
Sodium: 139 mEq/L (ref 135–145)
Total Bilirubin: 0.4 mg/dL (ref 0.2–1.2)
Total Protein: 6.8 g/dL (ref 6.0–8.3)

## 2019-08-25 ENCOUNTER — Other Ambulatory Visit: Payer: Self-pay

## 2019-08-25 ENCOUNTER — Encounter: Payer: Self-pay | Admitting: Endocrinology

## 2019-08-25 ENCOUNTER — Ambulatory Visit (INDEPENDENT_AMBULATORY_CARE_PROVIDER_SITE_OTHER): Payer: Medicare Other | Admitting: Endocrinology

## 2019-08-25 VITALS — BP 124/70 | HR 99 | Ht 70.0 in | Wt 214.8 lb

## 2019-08-25 DIAGNOSIS — E11649 Type 2 diabetes mellitus with hypoglycemia without coma: Secondary | ICD-10-CM | POA: Diagnosis not present

## 2019-08-25 LAB — POCT GLYCOSYLATED HEMOGLOBIN (HGB A1C): Hemoglobin A1C: 7.5 % — AB (ref 4.0–5.6)

## 2019-08-25 MED ORDER — NOVOLIN N FLEXPEN 100 UNIT/ML ~~LOC~~ SUPN: PEN_INJECTOR | SUBCUTANEOUS | 3 refills | Status: DC

## 2019-08-25 NOTE — Progress Notes (Signed)
Subjective:    Patient ID: Anthony Cuevas, male    DOB: Jun 20, 1950, 69 y.o.   MRN: [id number]  HPI Pt returns for f/u of diabetes mellitus:  DM type: Insulin-requiring type 2 Dx'ed: 7510 Complications: PAD Therapy: insulin since 2006, and metformin.   DKA: never Severe hypoglycemia: never.  Pancreatitis: never Pancreatic imaging: never SDOH: he takes human insulin, due to cost.   Other: he stopped pioglitizone, due to edema; he takes BID insulin, after poor results with multiple daily injections; he changed to NPH, due to pattern of cbg's.   Interval history: he just obtained a new continuous glucose monitor sensor.  glucose varies from 60-350.  It is in general lowest in the afternoon, and highest at HS.  He has mild hypoglycemia 1-2 times per week.   Past Medical History:  Diagnosis Date  . Arthritis   . Coronary atherosclerosis    a. 07/2018 CT Chest: 3 vessel coronary atherosclerosis; b. 07/2018 MV: EF 46% (GI uptake artifact), No ischemia/infarct. Mild to mod diffuse Ao atherosclerosis and at least moderate 2 vessel CAD (LAD/RCA) on CT imaging. Low risk.  . Depression    improved on sertraline  . Diabetes mellitus, type 2 ([place]) 09/1996  . GERD (gastroesophageal reflux disease) 1990  . History of peptic ulcer disease   . Hyperlipidemia 1992  . Hypertension 05/2003  . Lumbar stenosis L2-3  . Muscle atrophy    in arms from degenerative changes in neck  . Neck pain    chronic  . Smoker   . Wears glasses     Past Surgical History:  Procedure Laterality Date  . CERVICAL DISC SURGERY  04/19/02   fusion, Dr. Lorin Mercy  . CERVICAL DISCECTOMY  1997   partial  . CERVICAL DISCECTOMY  2000  . COLONOSCOPY WITH ESOPHAGOGASTRODUODENOSCOPY (EGD)  06/25/2015  . [place]  . KNEE ARTHROPLASTY  11/20/2011   Procedure: COMPUTER ASSISTED TOTAL KNEE ARTHROPLASTY;  Surgeon: Marybelle Killings, MD;  Location: [place];  Service: Orthopedics;  Laterality: Left;  Conversion Left Knee  Medial Uni to Total Knee Arthroplasty-Cemented  . LUMBAR LAMINECTOMY/DECOMPRESSION MICRODISCECTOMY N/A 04/28/2015   Procedure: L2-3 Decompression;  Surgeon: Marybelle Killings, MD;  Location: [place];  Service: Orthopedics;  Laterality: N/A;  . MR MRA DUPLICATE EXAM  INACTIVATE    . MULTIPLE TOOTH EXTRACTIONS    . Neisen funduplication  2585  . REPLACEMENT TOTAL KNEE  02/14/02, 05/04/02   left- partial  . SCC EXCISION  04/25/01   Dr. March Rummage  . STOMACH SURGERY  1984   PUD, [place]    Social History   Socioeconomic History  . Marital status: Married    Spouse name: Not on file  . Number of children: 3  . Years of education: Not on file  . Highest education level: Not on file  Occupational History  . Occupation: Training and development officer, retired    Comment: Scientific laboratory technician  Tobacco Use  . Smoking status: Former Smoker    Packs/day: 1.00    Years: 41.00    Pack years: 41.00    Types: Cigarettes    Quit date: 2014    Years since quitting: 7.5  . Smokeless tobacco: Never Used  Vaping Use  . Vaping Use: Never used  Substance and Sexual Activity  . Alcohol use: No    Alcohol/week: 0.0 standard drinks  . Drug use: No  . Sexual activity: Never  Other Topics Concern  . 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 Difficulty of Paying Living Expenses: Not hard at all  Food Insecurity: No Food Insecurity  . Worried About Charity fundraiser in the Last Year: Never true  . Ran Out of Food in the Last Year: Never true  Transportation Needs: No Transportation Needs  . Lack of Transportation (Medical): No  . Lack of Transportation (Non-Medical): No  Physical Activity: Inactive  . Days of Exercise per Week: 0 days  . Minutes of Exercise per Session: 0 min  Stress: No Stress Concern  Present  . Feeling of Stress : Not at all  Social Connections:   . Frequency of Communication with Friends and Family:   . Frequency of Social Gatherings with Friends and Family:   . Attends Religious Services:   . Active Member of Clubs or Organizations:   . Attends Archivist Meetings:   Marland Kitchen Marital Status:   Intimate Partner Violence: Not At Risk  . Fear of Current or Ex-Partner: No  . Emotionally Abused: No  . Physically Abused: No  . Sexually Abused: No    Current Outpatient Medications on File Prior to Visit  Medication Sig Dispense Refill  . aspirin EC 81 MG tablet Take 1 tablet (81 mg total) by mouth daily. Swallow whole. 90 tablet 3  . celecoxib (CELEBREX) 200 MG capsule TAKE 1 CAPSULE DAILY (Patient taking differently: Take 200 mg by mouth daily. ) 90 capsule 3  . Cholecalciferol (VITAMIN D3) 125 MCG (5000 UT) CAPS Take 5,000 Units by mouth 2 (two) times a week.    . Continuous Blood Gluc Sensor (FREESTYLE LIBRE 14 DAY SENSOR) MISC 1 Device by Does not apply route every 14 (fourteen) days. 6 each 3  . cyanocobalamin 1000 MCG tablet Take 1,000 mcg by mouth daily.    . cyclobenzaprine (FLEXERIL) 10 MG tablet TAKE 1 TABLET TWICE A DAY AS NEEDED FOR MUSCLE SPASMS (SEDATION CAUTION) (Patient taking differently: Take 10 mg by mouth 2 (two) times daily as needed for muscle spasms. TAKE 1 TABLET TWICE A DAY AS NEEDED FOR MUSCLE SPASMS (SEDATION CAUTION)) 180 tablet 1  . Insulin Pen Needle (SURE COMFORT PEN NEEDLES) 32G X 4 MM MISC Use to inject insulin BID; E11.65 100 each 2  . lisinopril (ZESTRIL) 40 MG tablet Take 1 tablet (40 mg total) by mouth daily. 90 tablet 3  . LUMIGAN 0.01 % SOLN Place 1 drop into both eyes at bedtime.     . metFORMIN (GLUCOPHAGE) 1000 MG tablet TAKE 1 TABLET TWICE A DAY WITH MEALS (Patient taking differently: Take 1,000 mg by mouth 2 (two) times daily with a meal. ) 180 tablet 3  . niacin (NIASPAN) 500 MG CR tablet TAKE 1 TABLET AT BEDTIME (Patient  taking differently: Take 500 mg by mouth at bedtime. ) 90 tablet 2  . Omega-3 Fatty Acids (FISH OIL PO) Take 1,000 mg by mouth once a week.    Glory Rosebush ULTRA test strip USE AS INSTRUCTED TO TEST BLOOD SUGARS 3 OR 4 TIMES DAILY 100 each 11  . pravastatin (PRAVACHOL) 80 MG tablet TAKE 1 TABLET DAILY (Patient taking differently: Take 80 mg by mouth daily. ) 90 tablet 3  . traMADol (ULTRAM) 50 MG tablet TAKE 1  TABLET EVERY 8 HOURS AS NEEDED FOR CHRONIC JOINT/BACK PAIN (SEDATION CAUTION) (Patient taking differently: Take 50 mg by mouth every 8 (eight) hours as needed (pain). ) 270 tablet 1   No current facility-administered medications on file prior to visit.    No Known Allergies  Family History  Problem Relation Age of Onset  . Diabetes Mother   . Hypertension Mother   . Stroke Mother   . Diabetes Sister   . Diabetes Brother   . Diabetes Brother   . Heart disease Brother   . Diabetes Father   . Breast cancer Maternal Grandmother        breast  . Lung disease Paternal Grandfather        black lung  . Arthritis Neg Hx   . Prostate cancer Neg Hx   . Colon cancer Neg Hx   . Stomach cancer Neg Hx     BP 124/70   Pulse 99   Ht 5\' 10"  (1.778 m)   Wt (!) 214 lb 12.8 oz (97.4 kg)   SpO2 97%   BMI 30.82 kg/m    Review of Systems Denies LOC    Objective:   Physical Exam VITAL SIGNS:  See vs page GENERAL: no distress Pulses: dorsalis pedis intact bilat.   MSK: no deformity of the feet CV: trace bilat leg edema Skin:  no ulcer on the feet.  normal color and temp on the feet. Neuro: sensation is intact to touch on the feet, but decreased from normal Ext: there is bilateral onychomycosis of the toenails.     Lab Results  Component Value Date   HGBA1C 7.5 (A) 08/25/2019   Lab Results  Component Value Date   CREATININE 1.14 08/19/2019   BUN 20 08/19/2019   NA 139 08/19/2019   K 4.8 08/19/2019   CL 102 08/19/2019   CO2 29 08/19/2019   Lab Results  Component Value  Date   CALCIUM 9.5 08/19/2019      Assessment & Plan:  Insulin-requiring type 2 DM, with PAD.  Hypoglycemia, due to insulin: this limits aggressiveness of glycemic control.   Patient Instructions  check your blood sugar 4 times a day.  vary the time of day when you check, between before the 3 meals, and at bedtime.  also check if you have symptoms of your blood sugar being too high or too low.  please keep a record of the readings and bring it to your next appointment here (or you can bring the meter itself).  You can write it on any piece of paper.  please call us sooner if your blood sugar goes below 70, or if you have a lot of readings over 200.   Please continue the same insulin.  Please come back for a follow-up appointment in 3 months.

## 2019-08-25 NOTE — Patient Instructions (Addendum)
check your blood sugar 4 times a day.  vary the time of day when you check, between before the 3 meals, and at bedtime.  also check if you have symptoms of your blood sugar being too high or too low.  please keep a record of the readings and bring it to your next appointment here (or you can bring the meter itself).  You can write it on any piece of paper.  please call us sooner if your blood sugar goes below 70, or if you have a lot of readings over 200.   Please continue the same insulin.  Please come back for a follow-up appointment in 3 months.

## 2019-09-01 ENCOUNTER — Telehealth: Payer: Self-pay | Admitting: *Deleted

## 2019-09-01 ENCOUNTER — Encounter: Payer: Self-pay | Admitting: Family Medicine

## 2019-09-01 NOTE — Telephone Encounter (Signed)
Pt contacted pre-abdominal aortogram  scheduled at [facility] for: Wednesday September 03, 2019 8:30 AM Verified arrival time and place: [facility]) at: 6:30 AM   No solid food after midnight prior to cath, clear liquids until 5 AM day of procedure.  Hold: Insulin-1/2 usual HS dose prior to procedure  Insulin-AM of procedure  Metformin-day of procedure and 48 hours post procedure  Except hold medications AM meds can be  taken pre-cath with sips of water including: ASA 81 mg   Confirmed patient has responsible adult to drive home post procedure and observe 24 hours after arriving home: yes  You are allowed ONE visitor in the waiting room during your procedure. Both you and your visitor must wear a mask once you enter the hospital.      COVID-19 Pre-Screening Questions:   In the past 14 days have you had a new cough associated with shortness of breath, fever (100.4 or greater) or sudden loss of taste or sense of smell? no  In the past 14 days have you been around anyone with known Covid 19? no  Have you been vaccinated for COVID-19? Yes, see immunization history   Reviewed procedure/mask/visitor instructions, COVID-19 screening questions with patient.

## 2019-09-03 ENCOUNTER — Ambulatory Visit (HOSPITAL_COMMUNITY)
Admission: RE | Admit: 2019-09-03 | Discharge: 2019-09-03 | Disposition: A | Discharge status: Home / Self Care | Payer: Medicare Other | Attending: Cardiovascular Disease | Admitting: Cardiovascular Disease

## 2019-09-03 ENCOUNTER — Other Ambulatory Visit: Payer: Self-pay

## 2019-09-03 ENCOUNTER — Ambulatory Visit (HOSPITAL_COMMUNITY)
Admission: RE | Disposition: A | Discharge status: Home / Self Care | Payer: Self-pay | Source: Home / Self Care | Attending: Cardiovascular Disease

## 2019-09-03 ENCOUNTER — Telehealth: Payer: Self-pay

## 2019-09-03 DIAGNOSIS — K219 Gastro-esophageal reflux disease without esophagitis: Secondary | ICD-10-CM | POA: Insufficient documentation

## 2019-09-03 DIAGNOSIS — E785 Hyperlipidemia, unspecified: Secondary | ICD-10-CM | POA: Diagnosis not present

## 2019-09-03 DIAGNOSIS — I10 Essential (primary) hypertension: Secondary | ICD-10-CM | POA: Insufficient documentation

## 2019-09-03 DIAGNOSIS — Z79899 Other long term (current) drug therapy: Secondary | ICD-10-CM | POA: Insufficient documentation

## 2019-09-03 DIAGNOSIS — Z87891 Personal history of nicotine dependence: Secondary | ICD-10-CM | POA: Insufficient documentation

## 2019-09-03 DIAGNOSIS — E1151 Type 2 diabetes mellitus with diabetic peripheral angiopathy without gangrene: Secondary | ICD-10-CM | POA: Diagnosis not present

## 2019-09-03 DIAGNOSIS — Z794 Long term (current) use of insulin: Secondary | ICD-10-CM | POA: Diagnosis not present

## 2019-09-03 DIAGNOSIS — I739 Peripheral vascular disease, unspecified: Secondary | ICD-10-CM

## 2019-09-03 DIAGNOSIS — I2510 Atherosclerotic heart disease of native coronary artery without angina pectoris: Secondary | ICD-10-CM | POA: Insufficient documentation

## 2019-09-03 DIAGNOSIS — I70212 Atherosclerosis of native arteries of extremities with intermittent claudication, left leg: Secondary | ICD-10-CM | POA: Insufficient documentation

## 2019-09-03 DIAGNOSIS — Z791 Long term (current) use of non-steroidal anti-inflammatories (NSAID): Secondary | ICD-10-CM | POA: Insufficient documentation

## 2019-09-03 DIAGNOSIS — F329 Major depressive disorder, single episode, unspecified: Secondary | ICD-10-CM | POA: Diagnosis not present

## 2019-09-03 DIAGNOSIS — I745 Embolism and thrombosis of iliac artery: Secondary | ICD-10-CM | POA: Diagnosis not present

## 2019-09-03 DIAGNOSIS — Z20822 Contact with and (suspected) exposure to covid-19: Secondary | ICD-10-CM | POA: Insufficient documentation

## 2019-09-03 HISTORY — PX: PERIPHERAL VASCULAR INTERVENTION: CATH118257

## 2019-09-03 HISTORY — PX: ABDOMINAL AORTOGRAM W/LOWER EXTREMITY: CATH118223

## 2019-09-03 LAB — SARS CORONAVIRUS 2 BY RT PCR (HOSPITAL ORDER, PERFORMED IN ~~LOC~~ HOSPITAL LAB): SARS Coronavirus 2: NEGATIVE

## 2019-09-03 LAB — GLUCOSE, CAPILLARY
Glucose-Capillary: 278 mg/dL — ABNORMAL HIGH (ref 70–99)
Glucose-Capillary: 223 mg/dL — ABNORMAL HIGH (ref 70–99)

## 2019-09-03 LAB — POCT ACTIVATED CLOTTING TIME: Activated Clotting Time: 197 seconds

## 2019-09-03 SURGERY — ABDOMINAL AORTOGRAM W/LOWER EXTREMITY
Anesthesia: LOCAL

## 2019-09-03 MED ORDER — ACETAMINOPHEN 325 MG PO TABS: 650.0000 mg | ORAL_TABLET | ORAL | Status: DC | PRN

## 2019-09-03 MED ORDER — MIDAZOLAM HCL 2 MG/2ML IJ SOLN
INTRAMUSCULAR | Status: DC | PRN
  Administered 2019-09-03 (×2): 1 mg via INTRAVENOUS

## 2019-09-03 MED ORDER — CLOPIDOGREL BISULFATE 300 MG PO TABS
ORAL_TABLET | ORAL | Status: AC
  Filled 2019-09-03: qty 2

## 2019-09-03 MED ORDER — SODIUM CHLORIDE 0.9% FLUSH: 3.0000 mL | INTRAVENOUS | Status: DC | PRN

## 2019-09-03 MED ORDER — MIDAZOLAM HCL 5 MG/5ML IJ SOLN
INTRAMUSCULAR | Status: AC
  Filled 2019-09-03: qty 5

## 2019-09-03 MED ORDER — LIDOCAINE HCL (PF) 1 % IJ SOLN
INTRAMUSCULAR | Status: AC
  Filled 2019-09-03: qty 30

## 2019-09-03 MED ORDER — LABETALOL HCL 5 MG/ML IV SOLN
INTRAVENOUS | Status: AC
  Filled 2019-09-03: qty 4

## 2019-09-03 MED ORDER — SODIUM CHLORIDE 0.9 % IV SOLN: INTRAVENOUS | Status: DC

## 2019-09-03 MED ORDER — IODIXANOL 320 MG/ML IV SOLN
INTRAVENOUS | Status: DC | PRN
  Administered 2019-09-03: 150 mL

## 2019-09-03 MED ORDER — LIDOCAINE HCL (PF) 1 % IJ SOLN
INTRAMUSCULAR | Status: DC | PRN
  Administered 2019-09-03 (×2): 15 mL

## 2019-09-03 MED ORDER — HEPARIN SODIUM (PORCINE) 1000 UNIT/ML IJ SOLN
INTRAMUSCULAR | Status: DC | PRN
  Administered 2019-09-03: 6000 [IU] via INTRAVENOUS
  Administered 2019-09-03: 3000 [IU] via INTRAVENOUS

## 2019-09-03 MED ORDER — ONDANSETRON HCL 4 MG/2ML IJ SOLN: 4.0000 mg | Freq: Four times a day (QID) | INTRAMUSCULAR | Status: DC | PRN

## 2019-09-03 MED ORDER — CLOPIDOGREL BISULFATE 75 MG PO TABS
75.0000 mg | ORAL_TABLET | Freq: Every day | ORAL | 11 refills | Status: DC
Start: 2019-09-03 — End: 2019-09-05

## 2019-09-03 MED ORDER — HEPARIN (PORCINE) IN NACL 1000-0.9 UT/500ML-% IV SOLN
INTRAVENOUS | Status: DC | PRN
  Administered 2019-09-03: 500 mL

## 2019-09-03 MED ORDER — HEPARIN (PORCINE) IN NACL 1000-0.9 UT/500ML-% IV SOLN
INTRAVENOUS | Status: AC
  Filled 2019-09-03: qty 500

## 2019-09-03 MED ORDER — HEPARIN SODIUM (PORCINE) 1000 UNIT/ML IJ SOLN
INTRAMUSCULAR | Status: AC
  Filled 2019-09-03: qty 1

## 2019-09-03 MED ORDER — SODIUM CHLORIDE 0.9% FLUSH: 3.0000 mL | Freq: Two times a day (BID) | INTRAVENOUS | Status: DC

## 2019-09-03 MED ORDER — ASPIRIN 81 MG PO CHEW: 81.0000 mg | CHEWABLE_TABLET | ORAL | Status: DC

## 2019-09-03 MED ORDER — LABETALOL HCL 5 MG/ML IV SOLN: 10.0000 mg | INTRAVENOUS | Status: DC | PRN

## 2019-09-03 MED ORDER — CLOPIDOGREL BISULFATE 300 MG PO TABS
ORAL_TABLET | ORAL | Status: DC | PRN
  Administered 2019-09-03: 600 mg via ORAL

## 2019-09-03 MED ORDER — SODIUM CHLORIDE 0.9 % IV SOLN: 250.0000 mL | INTRAVENOUS | Status: DC | PRN

## 2019-09-03 MED ORDER — FENTANYL CITRATE (PF) 100 MCG/2ML IJ SOLN
INTRAMUSCULAR | Status: AC
  Filled 2019-09-03: qty 2

## 2019-09-03 MED ORDER — FENTANYL CITRATE (PF) 100 MCG/2ML IJ SOLN
INTRAMUSCULAR | Status: DC | PRN
  Administered 2019-09-03: 25 ug via INTRAVENOUS
  Administered 2019-09-03: 50 ug via INTRAVENOUS

## 2019-09-03 MED ORDER — LABETALOL HCL 5 MG/ML IV SOLN
INTRAVENOUS | Status: DC | PRN
  Administered 2019-09-03: 20 mg via INTRAVENOUS

## 2019-09-03 SURGICAL SUPPLY — 25 items
BALLN JADE .035 6.0 X 60 (BALLOONS) ×3
BALLN MUSTANG 8X60X75 (BALLOONS) ×3
BALLOON JADE .035 6.0 X 60 (BALLOONS) ×2 IMPLANT
BALLOON MUSTANG 8X60X75 (BALLOONS) ×2 IMPLANT
CATH ANGIO 5F PIGTAIL 65CM (CATHETERS) ×3 IMPLANT
CATH CROSS OVER TEMPO 5F (CATHETERS) ×3 IMPLANT
CATH NAVICROSS ST 65CM (CATHETERS) ×2 IMPLANT
CATHETER NAVICROSS ST 65CM (CATHETERS) ×3
DEVICE CLOSURE MYNXGRIP 5F (Vascular Products) ×3 IMPLANT
DEVICE CLOSURE PERCLS PRGLD 6F (VASCULAR PRODUCTS) ×2 IMPLANT
GLIDEWIRE ADV .035X260CM (WIRE) ×3 IMPLANT
KIT ENCORE 26 ADVANTAGE (KITS) ×3 IMPLANT
KIT MICROPUNCTURE NIT STIFF (SHEATH) ×3 IMPLANT
KIT PV (KITS) ×3 IMPLANT
PERCLOSE PROGLIDE 6F (VASCULAR PRODUCTS) ×3
SHEATH BRITE TIP 7FR 35CM (SHEATH) ×6 IMPLANT
SHEATH PINNACLE 5F 10CM (SHEATH) ×3 IMPLANT
SHEATH PINNACLE 8F 10CM (SHEATH) ×3 IMPLANT
SHEATH PROBE COVER 6X72 (BAG) ×3 IMPLANT
STENT VIABAHN VBX 7X59X80 (Permanent Stent) ×3 IMPLANT
SYR MEDRAD MARK 7 150ML (SYRINGE) ×3 IMPLANT
TRANSDUCER W/STOPCOCK (MISCELLANEOUS) ×3 IMPLANT
TRAY PV CATH (CUSTOM PROCEDURE TRAY) ×3 IMPLANT
WIRE HI TORQ VERSACORE-J 145CM (WIRE) ×3 IMPLANT
WIRE ROSEN-J .035X180CM (WIRE) ×3 IMPLANT

## 2019-09-03 NOTE — Discharge Instructions (Signed)
Start clopidogrel(Plavix) 75 mg once daily tomorrow.  A prescription was sent to your [place].

## 2019-09-03 NOTE — Interval H&P Note (Signed)
History and Physical Interval Note:  09/03/2019 8:26 AM  Anthony Cuevas  has presented today for surgery, with the diagnosis of PAD.  The various methods of treatment have been discussed with the patient and family. After consideration of risks, benefits and other options for treatment, the patient has consented to  Procedure(s): ABDOMINAL AORTOGRAM W/LOWER EXTREMITY (N/A) as a surgical intervention.  The patient's history has been reviewed, patient examined, no change in status, stable for surgery.  I have reviewed the patient's chart and labs.  Questions were answered to the patient's satisfaction.     Kathlyn Sacramento

## 2019-09-03 NOTE — Telephone Encounter (Signed)
Orders are placed in Epic. Message fwd to scheduling to schedule

## 2019-09-03 NOTE — Telephone Encounter (Signed)
-----   Message from Wellington Hampshire, MD sent at 09/03/2019 10:48 AM EDT ----- Status post stent placement to the left common iliac artery.  Schedule ABI and aortoiliac duplex within 2 weeks and follow-up with me after.

## 2019-09-03 NOTE — Progress Notes (Signed)
Discharge instructions reviewed with patient and family. Verbalized understanding. 

## 2019-09-03 NOTE — Telephone Encounter (Signed)
Scheduled

## 2019-09-04 ENCOUNTER — Encounter (HOSPITAL_COMMUNITY): Payer: Self-pay | Admitting: Cardiovascular Disease

## 2019-09-04 MED FILL — Midazolam HCl Inj 5 MG/5ML (Base Equivalent): INTRAMUSCULAR | Qty: 2 | Status: AC

## 2019-09-05 ENCOUNTER — Other Ambulatory Visit: Payer: Self-pay

## 2019-09-05 MED ORDER — CLOPIDOGREL BISULFATE 75 MG PO TABS: 75.0000 mg | ORAL_TABLET | Freq: Every day | ORAL | 1 refills | Status: DC

## 2019-09-09 ENCOUNTER — Ambulatory Visit (HOSPITAL_COMMUNITY): Payer: Medicare Other

## 2019-09-15 ENCOUNTER — Ambulatory Visit (HOSPITAL_BASED_OUTPATIENT_CLINIC_OR_DEPARTMENT_OTHER)
Admission: RE | Admit: 2019-09-15 | Discharge: 2019-09-15 | Disposition: A | Discharge status: Home / Self Care | Payer: Medicare Other | Source: Ambulatory Visit | Attending: Cardiovascular Disease | Admitting: Cardiovascular Disease

## 2019-09-15 ENCOUNTER — Ambulatory Visit (HOSPITAL_COMMUNITY)
Admission: RE | Admit: 2019-09-15 | Discharge: 2019-09-15 | Disposition: A | Discharge status: Home / Self Care | Payer: Medicare Other | Source: Ambulatory Visit | Attending: Internal Medicine | Admitting: Internal Medicine

## 2019-09-15 ENCOUNTER — Other Ambulatory Visit (HOSPITAL_COMMUNITY): Payer: Self-pay | Admitting: Cardiovascular Disease

## 2019-09-15 ENCOUNTER — Other Ambulatory Visit: Payer: Self-pay

## 2019-09-15 DIAGNOSIS — I739 Peripheral vascular disease, unspecified: Secondary | ICD-10-CM

## 2019-09-15 DIAGNOSIS — Z95828 Presence of other vascular implants and grafts: Secondary | ICD-10-CM

## 2019-09-16 ENCOUNTER — Telehealth: Payer: Self-pay | Admitting: Cardiovascular Disease

## 2019-09-16 NOTE — Telephone Encounter (Signed)
Patient calling in stating he received a phone call yesterday, unsure what/who that was. Patient does has active orders for 2 vascular tests however, they were completed in the hospital yesterday. Patient has a fu with Dr. Fletcher Anon on 8/19  Please advise appropriately

## 2019-09-16 NOTE — Telephone Encounter (Signed)
Spoke with the patient. Adv him that I was not sure who was trying to get in contact with him from [facility]. I could not find any documentation. Adv the patient that he should f/u with Dr. Fletcher Anon as planned. Patient verbalized understanding and voiced appreciation for the call.

## 2019-09-18 ENCOUNTER — Other Ambulatory Visit: Payer: Self-pay

## 2019-09-18 ENCOUNTER — Ambulatory Visit (INDEPENDENT_AMBULATORY_CARE_PROVIDER_SITE_OTHER): Payer: Medicare Other | Admitting: Cardiovascular Disease

## 2019-09-18 ENCOUNTER — Encounter: Payer: Self-pay | Admitting: Cardiovascular Disease

## 2019-09-18 VITALS — BP 148/64 | HR 88 | Ht 70.0 in | Wt 206.4 lb

## 2019-09-18 DIAGNOSIS — E785 Hyperlipidemia, unspecified: Secondary | ICD-10-CM

## 2019-09-18 DIAGNOSIS — I739 Peripheral vascular disease, unspecified: Secondary | ICD-10-CM

## 2019-09-18 DIAGNOSIS — E119 Type 2 diabetes mellitus without complications: Secondary | ICD-10-CM | POA: Diagnosis not present

## 2019-09-18 DIAGNOSIS — Z794 Long term (current) use of insulin: Secondary | ICD-10-CM | POA: Diagnosis not present

## 2019-09-18 DIAGNOSIS — I2510 Atherosclerotic heart disease of native coronary artery without angina pectoris: Secondary | ICD-10-CM | POA: Diagnosis not present

## 2019-09-18 DIAGNOSIS — I10 Essential (primary) hypertension: Secondary | ICD-10-CM

## 2019-09-18 MED ORDER — CLOPIDOGREL BISULFATE 75 MG PO TABS: 75.0000 mg | ORAL_TABLET | Freq: Every day | ORAL | 3 refills | Status: DC

## 2019-09-18 NOTE — Progress Notes (Signed)
Cardiology Office Note   Date:  09/18/2019   ID:  Anthony Cuevas, DOB 03-01-1950, MRN [id number]  PCP:  Tonia Ghent, MD  Cardiologist:  Dr. Rockey Situ  Chief Complaint  Patient presents with  . PV procedure follow up      History of Present Illness: Anthony Cuevas is a 69 y.o. male who is here today for follow-up visit regarding peripheral arterial disease.He has known history of essential hypertension, hyperlipidemia, diabetes mellitus, remote tobacco use and coronary atherosclerosis noted on previous CT scan. He underwent stress testing in July 2020 which showed no evidence of ischemia or infarct. He was seen recently for severe left hip and leg claudication. He underwent noninvasive vascular studies which showed an ABI of 0.99 on the right and 0.54 in the left.  Duplex showed moderate right iliac disease .  On the left, the common iliac artery was occluded proximally into the mid segment with no evidence of infrainguinal disease with the exception of occluded anterior tibial artery. I proceeded with angiography earlier this month which showed moderate right common iliac artery stenosis, occluded and heavily calcified left common iliac artery in a long segment.  I performed successful angioplasty and covered stent placement to the left common iliac artery.  Postprocedure vascular studies showed improvement in ABI to normal with patent iliac stent and moderately elevated velocities just distal to the stent. He reports resolution of left leg claudication.  No chest pain or shortness of breath.  No side effects with antiplatelet medications.  Past Medical History:  Diagnosis Date  . Arthritis   . Coronary atherosclerosis    a. 07/2018 CT Chest: 3 vessel coronary atherosclerosis; b. 07/2018 MV: EF 46% (GI uptake artifact), No ischemia/infarct. Mild to mod diffuse Ao atherosclerosis and at least moderate 2 vessel CAD (LAD/RCA) on CT imaging. Low risk.  . Depression    improved on  sertraline  . Diabetes mellitus, type 2 ([place]) 09/1996  . GERD (gastroesophageal reflux disease) 1990  . History of peptic ulcer disease   . Hyperlipidemia 1992  . Hypertension 05/2003  . Lumbar stenosis L2-3  . Muscle atrophy    in arms from degenerative changes in neck  . Neck pain    chronic  . Smoker   . Wears glasses     Past Surgical History:  Procedure Laterality Date  . ABDOMINAL AORTOGRAM W/LOWER EXTREMITY N/A 09/03/2019   Procedure: ABDOMINAL AORTOGRAM W/LOWER EXTREMITY;  Surgeon: Wellington Hampshire, MD;  Location: [place] [facility];  Service: Cardiovascular;  Laterality: N/A;  Limited Study  . CERVICAL DISC SURGERY  04/19/02   fusion, Dr. Lorin Mercy  . CERVICAL DISCECTOMY  1997   partial  . CERVICAL DISCECTOMY  2000  . COLONOSCOPY WITH ESOPHAGOGASTRODUODENOSCOPY (EGD)  06/25/2015  . [place]  . KNEE ARTHROPLASTY  11/20/2011   Procedure: COMPUTER ASSISTED TOTAL KNEE ARTHROPLASTY;  Surgeon: Marybelle Killings, MD;  Location: [place];  Service: Orthopedics;  Laterality: Left;  Conversion Left Knee Medial Uni to Total Knee Arthroplasty-Cemented  . LUMBAR LAMINECTOMY/DECOMPRESSION MICRODISCECTOMY N/A 04/28/2015   Procedure: L2-3 Decompression;  Surgeon: Marybelle Killings, MD;  Location: [place];  Service: Orthopedics;  Laterality: N/A;  . MR MRA DUPLICATE EXAM  INACTIVATE    . MULTIPLE TOOTH EXTRACTIONS    . Neisen funduplication  3419  . PERIPHERAL VASCULAR INTERVENTION  09/03/2019   Procedure: PERIPHERAL VASCULAR INTERVENTION;  Surgeon: Wellington Hampshire, MD;  Location: [place] [facility];  Service:  Cardiovascular;;  Iliac Stent  . REPLACEMENT TOTAL KNEE  02/14/02, 05/04/02   left- partial  . SCC EXCISION  04/25/01   Dr. March Rummage  . STOMACH SURGERY  1984   PUD, [facility]     Current Outpatient Medications  Medication Sig Dispense Refill  . aspirin EC 81 MG tablet Take 1 tablet (81 mg total) by mouth daily. Swallow whole. 90 tablet 3  . Cholecalciferol (VITAMIN D3) 125 MCG (5000 UT) CAPS  Take 5,000 Units by mouth 2 (two) times a week.    . clopidogrel (PLAVIX) 75 MG tablet Take 1 tablet (75 mg total) by mouth daily. 90 tablet 3  . Continuous Blood Gluc Sensor (FREESTYLE LIBRE 14 DAY SENSOR) MISC 1 Device by Does not apply route every 14 (fourteen) days. 6 each 3  . cyanocobalamin 1000 MCG tablet Take 1,000 mcg by mouth daily.    . cyclobenzaprine (FLEXERIL) 10 MG tablet TAKE 1 TABLET TWICE A DAY AS NEEDED FOR MUSCLE SPASMS (SEDATION CAUTION) (Patient taking differently: Take 10 mg by mouth 2 (two) times daily as needed for muscle spasms. TAKE 1 TABLET TWICE A DAY AS NEEDED FOR MUSCLE SPASMS (SEDATION CAUTION)) 180 tablet 1  . Insulin NPH, Human,, Isophane, (NOVOLIN N FLEXPEN) 100 UNIT/ML Kiwkpen 170 units each morning, and 30 units each evening, and pen needles 3/day 55 pen 3  . Insulin Pen Needle (SURE COMFORT PEN NEEDLES) 32G X 4 MM MISC Use to inject insulin BID; E11.65 100 each 2  . lisinopril (ZESTRIL) 40 MG tablet Take 1 tablet (40 mg total) by mouth daily. 90 tablet 3  . LUMIGAN 0.01 % SOLN Place 1 drop into both eyes at bedtime.     . metFORMIN (GLUCOPHAGE) 1000 MG tablet TAKE 1 TABLET TWICE A DAY WITH MEALS (Patient taking differently: Take 1,000 mg by mouth 2 (two) times daily with a meal. ) 180 tablet 3  . niacin (NIASPAN) 500 MG CR tablet TAKE 1 TABLET AT BEDTIME (Patient taking differently: Take 500 mg by mouth at bedtime. ) 90 tablet 2  . Omega-3 Fatty Acids (FISH OIL PO) Take 1,000 mg by mouth once a week.    Glory Rosebush ULTRA test strip USE AS INSTRUCTED TO TEST BLOOD SUGARS 3 OR 4 TIMES DAILY 100 each 11  . pravastatin (PRAVACHOL) 80 MG tablet TAKE 1 TABLET DAILY (Patient taking differently: Take 80 mg by mouth daily. ) 90 tablet 3  . traMADol (ULTRAM) 50 MG tablet TAKE 1 TABLET EVERY 8 HOURS AS NEEDED FOR CHRONIC JOINT/BACK PAIN (SEDATION CAUTION) (Patient taking differently: Take 50 mg by mouth every 8 (eight) hours as needed (pain). ) 270 tablet 1   No current  facility-administered medications for this visit.    Allergies:   Patient has no known allergies.    Social History:  The patient  reports that he quit smoking about 7 years ago. His smoking use included cigarettes. He has a 41.00 pack-year smoking history. He has never used smokeless tobacco. He reports that he does not drink alcohol and does not use drugs.   Family History:  The patient's family history includes Breast cancer in his maternal grandmother; Diabetes in his brother, brother, father, mother, and sister; Heart disease in his brother; Hypertension in his mother; Lung disease in his paternal grandfather; Stroke in his mother.    ROS:  Please see the history of present illness.   Otherwise, review of systems are positive for none.   All other systems are reviewed and negative.  PHYSICAL EXAM: VS:  BP (!) 148/64   Pulse 88   Ht 5\' 10"  (1.778 m)   Wt 206 lb 6.4 oz (93.6 kg)   BMI 29.62 kg/m  , BMI Body mass index is 29.62 kg/m. GEN: Well nourished, well developed, in no acute distress  HEENT: normal  Neck: no JVD, carotid bruits, or masses Cardiac: RRR; no murmurs, rubs, or gallops,no edema  Respiratory:  clear to auscultation bilaterally, normal work of breathing GI: soft, nontender, nondistended, + BS MS: no deformity or atrophy  Skin: warm and dry, no rash Neuro:  Strength and sensation are intact Psych: euthymic mood, full affect Vascular: Radial pulses normal bilaterally.  Femoral pulses slightly decreased on the right and normal on the left.  No groin hematoma.  Distal pulses are palpable bilaterally.      EKG:  EKG is not ordered today.    Recent Labs: 12/04/2018: TSH 4.35 08/19/2019: ALT 40; BUN 20; Creatinine, Ser 1.14; Hemoglobin 13.3; Platelets 152.0; Potassium 4.8; Sodium 139    Lipid Panel    Component Value Date/Time   CHOL 144 12/04/2018 0952   TRIG 254.0 (H) 12/04/2018 0952   HDL 31.00 (L) 12/04/2018 0952   CHOLHDL 5 12/04/2018 0952   VLDL  50.8 (H) 12/04/2018 0952   LDLCALC 68 07/13/2014 0814   LDLDIRECT 71.0 12/04/2018 0952      Wt Readings from Last 3 Encounters:  09/18/19 206 lb 6.4 oz (93.6 kg)  09/03/19 214 lb (97.1 kg)  08/25/19 (!) 214 lb 12.8 oz (97.4 kg)      No flowsheet data found.    ASSESSMENT AND PLAN:  1.  Peripheral arterial disease: Status post recent left common iliac artery covered stent placement due to an occlusion associated with severe left leg claudication.  ABI improved to normal and claudication resolved.  The plan is to keep him on dual antiplatelet therapy for at least 6 months given the presence of a covered stent. Repeat Doppler studies in 6 months.  2.  Coronary atherosclerosis: Negative stress testing last year.  Currently with no anginal symptoms.  3.  Essential hypertension:  Blood pressure is reasonably controlled.    4.  Hyperlipidemia: Currently on high-dose pravastatin.  Most recent lipid profile showed an LDL of 71.    Disposition:   FU with me in 6 months  Signed,  Kathlyn Sacramento, MD  09/18/2019 8:22 AM    [place]

## 2019-09-18 NOTE — Patient Instructions (Signed)
Medication Instructions:  Your physician recommends that you continue on your current medications as directed. Please refer to the Current Medication list given to you today.  Plavix has been refilled today.  *If you need a refill on your cardiac medications before your next appointment, please call your pharmacy*   Lab Work: None ordered If you have labs (blood work) drawn today and your tests are completely normal, you will receive your results only by: Marland Kitchen MyChart Message (if you have MyChart) OR . A paper copy in the mail If you have any lab test that is abnormal or we need to change your treatment, we will call you to review the results.   Testing/Procedures: None ordered   Follow-Up: At [facility], you and your health needs are our priority.  As part of our continuing mission to provide you with exceptional heart care, we have created designated Provider Care Teams.  These Care Teams include your primary Cardiologist (physician) and Advanced Practice Providers (APPs -  Physician Assistants and Nurse Practitioners) who all work together to provide you with the care you need, when you need it.  We recommend signing up for the patient portal called "MyChart".  Sign up information is provided on this After Visit Summary.  MyChart is used to connect with patients for Virtual Visits (Telemedicine).  Patients are able to view lab/test results, encounter notes, upcoming appointments, etc.  Non-urgent messages can be sent to your provider as well.   To learn more about what you can do with MyChart, go to [email or web address].    Your next appointment:   6 month(s)  The format for your next appointment:   In Person  Provider:    You may see Dr. Fletcher Anon or one of the following Advanced Practice Providers on your designated Care Team:    Murray Hodgkins, NP  Christell Faith, PA-C  Marrianne Mood, PA-C    Other Instructions N/A

## 2019-09-21 ENCOUNTER — Other Ambulatory Visit: Payer: Self-pay | Admitting: Family Medicine

## 2019-09-22 NOTE — Telephone Encounter (Signed)
Electronic refill request. Cyclobenzaprine Last office visit:   07/03/2019 Last Filled:   180 tablet 1 05/07/2019

## 2019-09-23 NOTE — Telephone Encounter (Signed)
Sent. Thanks.   

## 2019-09-24 ENCOUNTER — Ambulatory Visit (INDEPENDENT_AMBULATORY_CARE_PROVIDER_SITE_OTHER): Payer: 59 | Admitting: Podiatry

## 2019-09-24 ENCOUNTER — Other Ambulatory Visit: Payer: Self-pay

## 2019-09-24 ENCOUNTER — Encounter: Payer: Self-pay | Admitting: Podiatry

## 2019-09-24 DIAGNOSIS — B351 Tinea unguium: Secondary | ICD-10-CM

## 2019-09-24 DIAGNOSIS — M79676 Pain in unspecified toe(s): Secondary | ICD-10-CM

## 2019-09-24 DIAGNOSIS — E119 Type 2 diabetes mellitus without complications: Secondary | ICD-10-CM

## 2019-09-24 DIAGNOSIS — M2010 Hallux valgus (acquired), unspecified foot: Secondary | ICD-10-CM

## 2019-09-24 NOTE — Progress Notes (Signed)
This patient returns to my office for at risk foot care.  This patient requires this care by a professional since this patient will be at risk due to having type 2 diabetes.  This patient is unable to cut nails himself since the patient cannot reach his nails.These nails are painful walking and wearing shoes.  This patient presents for at risk foot care today.  General Appearance  Alert, conversant and in no acute stress.  Vascular  Dorsalis pedis and posterior tibial  pulses are palpable  bilaterally.  Capillary return is within normal limits  bilaterally. Temperature is within normal limits  bilaterally.  Neurologic  Senn-Weinstein monofilament wire test diminished  bilaterally. Muscle power within normal limits bilaterally.  Nails Thick disfigured discolored nails with subungual debris  from hallux to fifth toes bilaterally. No evidence of bacterial infection or drainage bilaterally.  Orthopedic  No limitations of motion  feet .  No crepitus or effusions noted.  No bony pathology or digital deformities noted.  HAV  B/L.  Skin  normotropic skin with no porokeratosis noted bilaterally.  No signs of infections or ulcers noted.     Onychomycosis  Pain in right toes  Pain in left toes  Consent was obtained for treatment procedures.   Mechanical debridement of nails 1-5  bilaterally performed with a nail nipper.  Filed with dremel without incident.    Return office visit    10   weeks                  Told patient to return for periodic foot care and evaluation due to potential at risk complications.   Everette Mall DPM  

## 2019-10-07 ENCOUNTER — Ambulatory Visit (INDEPENDENT_AMBULATORY_CARE_PROVIDER_SITE_OTHER): Payer: Medicare Other | Admitting: Endocrinology

## 2019-10-07 ENCOUNTER — Other Ambulatory Visit: Payer: Self-pay

## 2019-10-07 DIAGNOSIS — E11649 Type 2 diabetes mellitus with hypoglycemia without coma: Secondary | ICD-10-CM

## 2019-10-07 LAB — POCT GLYCOSYLATED HEMOGLOBIN (HGB A1C): Hemoglobin A1C: 7.6 % — AB (ref 4.0–5.6)

## 2019-10-07 MED ORDER — FREESTYLE LIBRE 14 DAY SENSOR MISC: 1.0000 | 3 refills | Status: DC

## 2019-10-07 MED ORDER — NOVOLIN N FLEXPEN 100 UNIT/ML ~~LOC~~ SUPN: PEN_INJECTOR | SUBCUTANEOUS | Status: DC

## 2019-10-07 MED ORDER — FREESTYLE LIBRE 14 DAY READER DEVI: 1.0000 | Freq: Once | 0 refills | Status: AC

## 2019-10-07 NOTE — Patient Instructions (Addendum)
check your blood sugar 4 times a day.  vary the time of day when you check, between before the 3 meals, and at bedtime.  also check if you have symptoms of your blood sugar being too high or too low.  please keep a record of the readings and bring it to your next appointment here (or you can bring the meter itself).  You can write it on any piece of paper.  please call us sooner if your blood sugar goes below 70, or if you have a lot of readings over 200.   Please continue the same insulin.  However, if you are going to be active that day, take just 140 units in the morning Please come back for a follow-up appointment in 3 months.

## 2019-10-07 NOTE — Progress Notes (Signed)
Subjective:    Patient ID: Anthony Cuevas, male    DOB: 1950-04-11, 69 y.o.   MRN: [id number]  HPI Pt returns for f/u of diabetes mellitus:  DM type: Insulin-requiring type 2 Dx'ed: 3016 Complications: PAD Therapy: insulin since 2006, and metformin.   DKA: never Severe hypoglycemia: never.  Pancreatitis: never Pancreatic imaging: never SDOH: he takes human insulin, due to cost.   Other: he stopped pioglitizone, due to edema; he takes BID insulin, after poor results with multiple daily injections.  Interval history: continuous glucose monitor is reviewed.  glucose varies from 40-220.  It is in general lowest in the afternoon. He has mild hypoglycemia 1-2 times per week.   Past Medical History:  Diagnosis Date  . Arthritis   . Coronary atherosclerosis    a. 07/2018 CT Chest: 3 vessel coronary atherosclerosis; b. 07/2018 MV: EF 46% (GI uptake artifact), No ischemia/infarct. Mild to mod diffuse Ao atherosclerosis and at least moderate 2 vessel CAD (LAD/RCA) on CT imaging. Low risk.  . Depression    improved on sertraline  . Diabetes mellitus, type 2 ([place]) 09/1996  . GERD (gastroesophageal reflux disease) 1990  . History of peptic ulcer disease   . Hyperlipidemia 1992  . Hypertension 05/2003  . Lumbar stenosis L2-3  . Muscle atrophy    in arms from degenerative changes in neck  . Neck pain    chronic  . Smoker   . Wears glasses     Past Surgical History:  Procedure Laterality Date  . ABDOMINAL AORTOGRAM W/LOWER EXTREMITY N/A 09/03/2019   Procedure: ABDOMINAL AORTOGRAM W/LOWER EXTREMITY;  Surgeon: Wellington Hampshire, MD;  Location: [facility];  Service: Cardiovascular;  Laterality: N/A;  Limited Study  . CERVICAL DISC SURGERY  04/19/02   fusion, Dr. Lorin Mercy  . CERVICAL DISCECTOMY  1997   partial  . CERVICAL DISCECTOMY  2000  . COLONOSCOPY WITH ESOPHAGOGASTRODUODENOSCOPY (EGD)  06/25/2015  . [place]  . KNEE ARTHROPLASTY  11/20/2011   Procedure: COMPUTER  ASSISTED TOTAL KNEE ARTHROPLASTY;  Surgeon: Marybelle Killings, MD;  Location: [facility];  Service: Orthopedics;  Laterality: Left;  Conversion Left Knee Medial Uni to Total Knee Arthroplasty-Cemented  . LUMBAR LAMINECTOMY/DECOMPRESSION MICRODISCECTOMY N/A 04/28/2015   Procedure: L2-3 Decompression;  Surgeon: Marybelle Killings, MD;  Location: [facility];  Service: Orthopedics;  Laterality: N/A;  . MR MRA DUPLICATE EXAM  INACTIVATE    . MULTIPLE TOOTH EXTRACTIONS    . Neisen funduplication  0109  . PERIPHERAL VASCULAR INTERVENTION  09/03/2019   Procedure: PERIPHERAL VASCULAR INTERVENTION;  Surgeon: Wellington Hampshire, MD;  Location: [facility];  Service: Cardiovascular;;  Iliac Stent  . REPLACEMENT TOTAL KNEE  02/14/02, 05/04/02   left- partial  . SCC EXCISION  04/25/01   Dr. March Rummage  . STOMACH SURGERY  1984   PUD, [place]    Social History   Socioeconomic History  . Marital status: Married    Spouse name: Not on file  . Number of children: 3  . Years of education: Not on file  . Highest education level: Not on file  Occupational History  . Occupation: Training and development officer, retired    Comment: Scientific laboratory technician  Tobacco Use  . Smoking status: Former Smoker    Packs/day: 1.00    Years: 41.00    Pack years: 41.00    Types: Cigarettes    Quit date: 2014    Years since quitting: 7.6  . Smokeless tobacco: Never  Used  Vaping Use  . Vaping Use: Never used  Substance and Sexual Activity  . Alcohol use: No    Alcohol/week: 0.0 standard drinks  . Drug use: No  . Sexual activity: Never  Other Topics Concern  . 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 Difficulty of Paying Living Expenses: Not hard at all  Food Insecurity: No Food Insecurity  . Worried About Charity fundraiser in the  Last Year: Never true  . Ran Out of Food in the Last Year: Never true  Transportation Needs: No Transportation Needs  . Lack of Transportation (Medical): No  . Lack of Transportation (Non-Medical): No  Physical Activity: Inactive  . Days of Exercise per Week: 0 days  . Minutes of Exercise per Session: 0 min  Stress: No Stress Concern Present  . Feeling of Stress : Not at all  Social Connections:   . Frequency of Communication with Friends and Family: Not on file  . Frequency of Social Gatherings with Friends and Family: Not on file  . Attends Religious Services: Not on file  . Active Member of Clubs or Organizations: Not on file  . Attends Archivist Meetings: Not on file  . Marital Status: Not on file  Intimate Partner Violence: Not At Risk  . Fear of Current or Ex-Partner: No  . Emotionally Abused: No  . Physically Abused: No  . Sexually Abused: No    Current Outpatient Medications on File Prior to Visit  Medication Sig Dispense Refill  . aspirin EC 81 MG tablet Take 1 tablet (81 mg total) by mouth daily. Swallow whole. 90 tablet 3  . celecoxib (CELEBREX) 200 MG capsule Take 200 mg by mouth daily.    . Cholecalciferol (VITAMIN D3) 125 MCG (5000 UT) CAPS Take 5,000 Units by mouth 2 (two) times a week.    . clopidogrel (PLAVIX) 75 MG tablet Take 1 tablet (75 mg total) by mouth daily. 90 tablet 3  . cyanocobalamin 1000 MCG tablet Take 1,000 mcg by mouth daily.    . Insulin Pen Needle (SURE COMFORT PEN NEEDLES) 32G X 4 MM MISC Use to inject insulin BID; E11.65 100 each 2  . latanoprost (XALATAN) 0.005 % ophthalmic solution 1 drop at bedtime.    Marland Kitchen lisinopril (ZESTRIL) 40 MG tablet Take 1 tablet (40 mg total) by mouth daily. 90 tablet 3  . LUMIGAN 0.01 % SOLN Place 1 drop into both eyes at bedtime.     . metFORMIN (GLUCOPHAGE) 1000 MG tablet TAKE 1 TABLET TWICE A DAY WITH MEALS (Patient taking differently: Take 1,000 mg by mouth 2 (two) times daily with a meal. ) 180 tablet 3   . niacin (NIASPAN) 500 MG CR tablet TAKE 1 TABLET AT BEDTIME (Patient taking differently: Take 500 mg by mouth at bedtime. ) 90 tablet 2  . Omega-3 Fatty Acids (FISH OIL PO) Take 1,000 mg by mouth once a week.    Glory Rosebush ULTRA test strip USE AS INSTRUCTED TO TEST BLOOD SUGARS 3 OR 4 TIMES DAILY 100 each 11  . pravastatin (PRAVACHOL) 80 MG tablet TAKE 1 TABLET DAILY (Patient taking differently: Take 80 mg by mouth daily. ) 90 tablet 3  . traMADol (ULTRAM)  50 MG tablet TAKE 1 TABLET EVERY 8 HOURS AS NEEDED FOR CHRONIC JOINT/BACK PAIN (SEDATION CAUTION) (Patient taking differently: Take 50 mg by mouth every 8 (eight) hours as needed (pain). ) 270 tablet 1   No current facility-administered medications on file prior to visit.    No Known Allergies  Family History  Problem Relation Age of Onset  . Diabetes Mother   . Hypertension Mother   . Stroke Mother   . Diabetes Sister   . Diabetes Brother   . Diabetes Brother   . Heart disease Brother   . Diabetes Father   . Breast cancer Maternal Grandmother        breast  . Lung disease Paternal Grandfather        black lung  . Arthritis Neg Hx   . Prostate cancer Neg Hx   . Colon cancer Neg Hx   . Stomach cancer Neg Hx     There were no vitals taken for this visit.   Review of Systems Denies LOC.      Objective:   Physical Exam GENERAL: no distress Pulses: dorsalis pedis intact bilat.   MSK: no deformity of the feet CV: trace bilat leg edema Skin:  no ulcer on the feet.  normal color and temp on the feet. Neuro: sensation is intact to touch on the feet, but decreased from normal.  Ext: there is bilateral onychomycosis of the toenails.    Lab Results  Component Value Date   CREATININE 1.14 08/19/2019   BUN 20 08/19/2019   NA 139 08/19/2019   K 4.8 08/19/2019   CL 102 08/19/2019   CO2 29 08/19/2019       Assessment & Plan:  Insulin-requiring type 2 DM, with PAD.  Hypoglycemia, due to insulin: this limits  aggressiveness of glycemic control.  Patient Instructions  check your blood sugar 4 times a day.  vary the time of day when you check, between before the 3 meals, and at bedtime.  also check if you have symptoms of your blood sugar being too high or too low.  please keep a record of the readings and bring it to your next appointment here (or you can bring the meter itself).  You can write it on any piece of paper.  please call us sooner if your blood sugar goes below 70, or if you have a lot of readings over 200.   Please continue the same insulin.  However, if you are going to be active that day, take just 140 units in the morning Please come back for a follow-up appointment in 3 months.

## 2019-10-08 ENCOUNTER — Encounter (HOSPITAL_COMMUNITY): Payer: Self-pay | Admitting: Cardiovascular Disease

## 2019-10-16 ENCOUNTER — Other Ambulatory Visit: Payer: Self-pay

## 2019-10-16 ENCOUNTER — Ambulatory Visit (INDEPENDENT_AMBULATORY_CARE_PROVIDER_SITE_OTHER)
Admission: RE | Admit: 2019-10-16 | Discharge: 2019-10-16 | Disposition: A | Discharge status: Home / Self Care | Payer: Medicare Other | Source: Ambulatory Visit | Attending: Family Medicine | Admitting: Family Medicine

## 2019-10-16 ENCOUNTER — Encounter: Payer: Self-pay | Admitting: Family Medicine

## 2019-10-16 ENCOUNTER — Ambulatory Visit (INDEPENDENT_AMBULATORY_CARE_PROVIDER_SITE_OTHER): Payer: Medicare Other | Admitting: Family Medicine

## 2019-10-16 VITALS — BP 146/74 | HR 104 | Temp 97.4°F | Ht 70.0 in | Wt 213.4 lb

## 2019-10-16 DIAGNOSIS — Z23 Encounter for immunization: Secondary | ICD-10-CM | POA: Diagnosis not present

## 2019-10-16 DIAGNOSIS — M545 Low back pain, unspecified: Secondary | ICD-10-CM

## 2019-10-16 NOTE — Patient Instructions (Signed)
Try using ice vs heat and see which helps more.   Keep stretching and using flexeril/tramadol.  Go to the lab on the way out.   If you have mychart we'll likely use that to update you.    Take care.  Glad to see you.

## 2019-10-16 NOTE — Progress Notes (Signed)
This visit occurred during the SARS-CoV-2 public health emergency.  Safety protocols were in place, including screening questions prior to the visit, additional usage of staff PPE, and extensive cleaning of exam room while observing appropriate contact time as indicated for disinfecting solutions.  Back pain. Noted early AM, stiff when getting out of bed.  Doing knee to chest stretches with some relief.  Flexeril helps some, as does tramadol- no ADE on med.  Then stiffness returns in the evening, esp after an active day.  L>R lower back pain.  No radicular pain.  No FCANVD.  No burning with urination.  No B/B sx.  No rash.  No trauma.  His mattress is in good shape and doesn't appear to be the causative issue.    His prev leg pain is clearly better after angioplasty and covered stent placement to the left common iliac artery.    Flu shot done at [facility].    Meds, vitals, and allergies reviewed.   ROS: Per HPI unless specifically indicated in ROS section   nad ncat Neck supple, no LA rrr ctab abd soft not ttp.  SI not ttp on testing.   Midline back not ttp.  No CVA pain. Left greater than right lower back tender with muscle spasm noted.  No bruising.  No rash.

## 2019-10-18 DIAGNOSIS — E119 Type 2 diabetes mellitus without complications: Secondary | ICD-10-CM | POA: Diagnosis not present

## 2019-10-18 DIAGNOSIS — Z794 Long term (current) use of insulin: Secondary | ICD-10-CM | POA: Diagnosis not present

## 2019-10-19 NOTE — Assessment & Plan Note (Signed)
D/w pt about tylenol 1000mg  TID.   Try using ice vs heat and see which helps more.   Keep stretching and using flexeril/tramadol.  See notes on imaging.  Okay for outpatient follow-up.  No weakness in lower extremities.

## 2019-10-31 ENCOUNTER — Other Ambulatory Visit: Payer: Self-pay | Admitting: Endocrinology

## 2019-10-31 DIAGNOSIS — E11649 Type 2 diabetes mellitus with hypoglycemia without coma: Secondary | ICD-10-CM

## 2019-11-17 DIAGNOSIS — E119 Type 2 diabetes mellitus without complications: Secondary | ICD-10-CM | POA: Diagnosis not present

## 2019-11-17 DIAGNOSIS — Z794 Long term (current) use of insulin: Secondary | ICD-10-CM | POA: Diagnosis not present

## 2019-11-23 ENCOUNTER — Other Ambulatory Visit: Payer: Self-pay | Admitting: Family Medicine

## 2019-11-23 DIAGNOSIS — E782 Mixed hyperlipidemia: Secondary | ICD-10-CM

## 2019-11-23 DIAGNOSIS — D696 Thrombocytopenia, unspecified: Secondary | ICD-10-CM

## 2019-11-23 DIAGNOSIS — D509 Iron deficiency anemia, unspecified: Secondary | ICD-10-CM

## 2019-11-23 DIAGNOSIS — E11649 Type 2 diabetes mellitus with hypoglycemia without coma: Secondary | ICD-10-CM

## 2019-11-25 ENCOUNTER — Ambulatory Visit: Payer: Medicare Other | Admitting: Endocrinology

## 2019-12-05 ENCOUNTER — Other Ambulatory Visit (INDEPENDENT_AMBULATORY_CARE_PROVIDER_SITE_OTHER): Payer: Medicare Other

## 2019-12-05 ENCOUNTER — Ambulatory Visit (INDEPENDENT_AMBULATORY_CARE_PROVIDER_SITE_OTHER): Payer: Medicare Other

## 2019-12-05 ENCOUNTER — Other Ambulatory Visit: Payer: Self-pay

## 2019-12-05 DIAGNOSIS — Z0000 Encounter for general adult medical examination without abnormal findings: Secondary | ICD-10-CM

## 2019-12-05 DIAGNOSIS — D696 Thrombocytopenia, unspecified: Secondary | ICD-10-CM

## 2019-12-05 DIAGNOSIS — D509 Iron deficiency anemia, unspecified: Secondary | ICD-10-CM

## 2019-12-05 DIAGNOSIS — E782 Mixed hyperlipidemia: Secondary | ICD-10-CM

## 2019-12-05 LAB — COMPREHENSIVE METABOLIC PANEL
ALT: 31 U/L (ref 0–53)
AST: 32 U/L (ref 0–37)
Albumin: 4.4 g/dL (ref 3.5–5.2)
Alkaline Phosphatase: 66 U/L (ref 39–117)
BUN: 18 mg/dL (ref 6–23)
CO2: 28 mEq/L (ref 19–32)
Calcium: 9.7 mg/dL (ref 8.4–10.5)
Chloride: 102 mEq/L (ref 96–112)
Creatinine, Ser: 1.2 mg/dL (ref 0.40–1.50)
GFR: 62.38 mL/min (ref >60.00)
Glucose, Bld: 110 mg/dL — ABNORMAL HIGH (ref 70–99)
Potassium: 5 mEq/L (ref 3.5–5.1)
Sodium: 141 mEq/L (ref 135–145)
Total Bilirubin: 0.6 mg/dL (ref 0.2–1.2)
Total Protein: 7.4 g/dL (ref 6.0–8.3)

## 2019-12-05 LAB — CBC WITH DIFFERENTIAL/PLATELET
Basophils Absolute: 0 10*3/uL (ref 0.0–0.1)
Basophils Relative: 0.6 % (ref 0.0–3.0)
Eosinophils Absolute: 0.3 10*3/uL (ref 0.0–0.7)
Eosinophils Relative: 3.7 % (ref 0.0–5.0)
HCT: 41.4 % (ref 39.0–52.0)
Hemoglobin: 13.9 g/dL (ref 13.0–17.0)
Lymphocytes Relative: 25.9 % (ref 12.0–46.0)
Lymphs Abs: 1.8 10*3/uL (ref 0.7–4.0)
MCHC: 33.6 g/dL (ref 30.0–36.0)
MCV: 93.7 fl (ref 78.0–100.0)
Monocytes Absolute: 1.1 10*3/uL — ABNORMAL HIGH (ref 0.1–1.0)
Monocytes Relative: 15.6 % — ABNORMAL HIGH (ref 3.0–12.0)
Neutro Abs: 3.8 10*3/uL (ref 1.4–7.7)
Neutrophils Relative %: 54.2 % (ref 43.0–77.0)
Platelets: 212 10*3/uL (ref 150.0–400.0)
RBC: 4.42 Mil/uL (ref 4.22–5.81)
RDW: 12.3 % (ref 11.5–15.5)
WBC: 6.9 10*3/uL (ref 4.0–10.5)

## 2019-12-05 LAB — LIPID PANEL
Cholesterol: 135 mg/dL (ref 0–200)
HDL: 34.1 mg/dL — ABNORMAL LOW (ref >39.00)
NonHDL: 101.31
Total CHOL/HDL Ratio: 4
Triglycerides: 204 mg/dL — ABNORMAL HIGH (ref 0.0–149.0)
VLDL: 40.8 mg/dL — ABNORMAL HIGH (ref 0.0–40.0)

## 2019-12-05 LAB — TSH: TSH: 9.43 u[IU]/mL — ABNORMAL HIGH (ref 0.35–4.50)

## 2019-12-05 LAB — LDL CHOLESTEROL, DIRECT: Direct LDL: 78 mg/dL

## 2019-12-05 LAB — IRON: Iron: 118 ug/dL (ref 42–165)

## 2019-12-05 NOTE — Patient Instructions (Signed)
Anthony Cuevas , Thank you for taking time to come for your Medicare Wellness Visit. I appreciate your ongoing commitment to your health goals. Please review the following plan we discussed and let me know if I can assist you in the future.   Screening recommendations/referrals: Colonoscopy: Up to date, completed 5/262017, due 05/2020 Recommended yearly ophthalmology/optometry visit for glaucoma screening and checkup Recommended yearly dental visit for hygiene and checkup  Vaccinations: Influenza vaccine: Up to date, completed 10/16/2019, due 08/2020 Pneumococcal vaccine: Completed series Tdap vaccine: Up to date, completed 12/07/2011, due 11/2021 Shingles vaccine: due, check with your pharmacy regarding insurance coverage if interested   Covid-19: Completed series  Advanced directives: Advance directive discussed with you today. I have provided a copy for you to complete at home and have notarized. Once this is complete please bring a copy in to our office so we can scan it into your chart.  Conditions/risks identified: diabetes, hypertension, hyperlipidemia  Next appointment: Follow up in one year for your annual wellness visit.   Preventive Care 68 Years and Older, Male Preventive care refers to lifestyle choices and visits with your health care provider that can promote health and wellness. What does preventive care include?  A yearly physical exam. This is also called an annual well check.  Dental exams once or twice a year.  Routine eye exams. Ask your health care provider how often you should have your eyes checked.  Personal lifestyle choices, including:  Daily care of your teeth and gums.  Regular physical activity.  Eating a healthy diet.  Avoiding tobacco and drug use.  Limiting alcohol use.  Practicing safe sex.  Taking low doses of aspirin every day.  Taking vitamin and mineral supplements as recommended by your health care provider. What happens during an  annual well check? The services and screenings done by your health care provider during your annual well check will depend on your age, overall health, lifestyle risk factors, and family history of disease. Counseling  Your health care provider may ask you questions about your:  Alcohol use.  Tobacco use.  Drug use.  Emotional well-being.  Home and relationship well-being.  Sexual activity.  Eating habits.  History of falls.  Memory and ability to understand (cognition).  Work and work Statistician. Screening  You may have the following tests or measurements:  Height, weight, and BMI.  Blood pressure.  Lipid and cholesterol levels. These may be checked every 5 years, or more frequently if you are over 89 years old.  Skin check.  Lung cancer screening. You may have this screening every year starting at age 74 if you have a 30-pack-year history of smoking and currently smoke or have quit within the past 15 years.  Fecal occult blood test (FOBT) of the stool. You may have this test every year starting at age 32.  Flexible sigmoidoscopy or colonoscopy. You may have a sigmoidoscopy every 5 years or a colonoscopy every 10 years starting at age 18.  Prostate cancer screening. Recommendations will vary depending on your family history and other risks.  Hepatitis C blood test.  Hepatitis B blood test.  Sexually transmitted disease (STD) testing.  Diabetes screening. This is done by checking your blood sugar (glucose) after you have not eaten for a while (fasting). You may have this done every 1-3 years.  Abdominal aortic aneurysm (AAA) screening. You may need this if you are a current or former smoker.  Osteoporosis. You may be screened starting at age 94  if you are at high risk. Talk with your health care provider about your test results, treatment options, and if necessary, the need for more tests. Vaccines  Your health care provider may recommend certain vaccines,  such as:  Influenza vaccine. This is recommended every year.  Tetanus, diphtheria, and acellular pertussis (Tdap, Td) vaccine. You may need a Td booster every 10 years.  Zoster vaccine. You may need this after age 8.  Pneumococcal 13-valent conjugate (PCV13) vaccine. One dose is recommended after age 72.  Pneumococcal polysaccharide (PPSV23) vaccine. One dose is recommended after age 73. Talk to your health care provider about which screenings and vaccines you need and how often you need them. This information is not intended to replace advice given to you by your health care provider. Make sure you discuss any questions you have with your health care provider. Document Released: 02/12/2015 Document Revised: 10/06/2015 Document Reviewed: 11/17/2014 Elsevier Interactive Patient Education  2017 [place] Prevention in the Home Falls can cause injuries. They can happen to people of all ages. There are many things you can do to make your home safe and to help prevent falls. What can I do on the outside of my home?  Regularly fix the edges of walkways and driveways and fix any cracks.  Remove anything that might make you trip as you walk through a door, such as a raised step or threshold.  Trim any bushes or trees on the path to your home.  Use bright outdoor lighting.  Clear any walking paths of anything that might make someone trip, such as rocks or tools.  Regularly check to see if handrails are loose or broken. Make sure that both sides of any steps have handrails.  Any raised decks and porches should have guardrails on the edges.  Have any leaves, snow, or ice cleared regularly.  Use sand or salt on walking paths during winter.  Clean up any spills in your garage right away. This includes oil or grease spills. What can I do in the bathroom?  Use night lights.  Install grab bars by the toilet and in the tub and shower. Do not use towel bars as grab bars.  Use  non-skid mats or decals in the tub or shower.  If you need to sit down in the shower, use a plastic, non-slip stool.  Keep the floor dry. Clean up any water that spills on the floor as soon as it happens.  Remove soap buildup in the tub or shower regularly.  Attach bath mats securely with double-sided non-slip rug tape.  Do not have throw rugs and other things on the floor that can make you trip. What can I do in the bedroom?  Use night lights.  Make sure that you have a light by your bed that is easy to reach.  Do not use any sheets or blankets that are too big for your bed. They should not hang down onto the floor.  Have a firm chair that has side arms. You can use this for support while you get dressed.  Do not have throw rugs and other things on the floor that can make you trip. What can I do in the kitchen?  Clean up any spills right away.  Avoid walking on wet floors.  Keep items that you use a lot in easy-to-reach places.  If you need to reach something above you, use a strong step stool that has a grab bar.  Keep electrical  cords out of the way.  Do not use floor polish or wax that makes floors slippery. If you must use wax, use non-skid floor wax.  Do not have throw rugs and other things on the floor that can make you trip. What can I do with my stairs?  Do not leave any items on the stairs.  Make sure that there are handrails on both sides of the stairs and use them. Fix handrails that are broken or loose. Make sure that handrails are as long as the stairways.  Check any carpeting to make sure that it is firmly attached to the stairs. Fix any carpet that is loose or worn.  Avoid having throw rugs at the top or bottom of the stairs. If you do have throw rugs, attach them to the floor with carpet tape.  Make sure that you have a light switch at the top of the stairs and the bottom of the stairs. If you do not have them, ask someone to add them for you. What  else can I do to help prevent falls?  Wear shoes that:  Do not have high heels.  Have rubber bottoms.  Are comfortable and fit you well.  Are closed at the toe. Do not wear sandals.  If you use a stepladder:  Make sure that it is fully opened. Do not climb a closed stepladder.  Make sure that both sides of the stepladder are locked into place.  Ask someone to hold it for you, if possible.  Clearly mark and make sure that you can see:  Any grab bars or handrails.  First and last steps.  Where the edge of each step is.  Use tools that help you move around (mobility aids) if they are needed. These include:  Canes.  Walkers.  Scooters.  Crutches.  Turn on the lights when you go into a dark area. Replace any light bulbs as soon as they burn out.  Set up your furniture so you have a clear path. Avoid moving your furniture around.  If any of your floors are uneven, fix them.  If there are any pets around you, be aware of where they are.  Review your medicines with your doctor. Some medicines can make you feel dizzy. This can increase your chance of falling. Ask your doctor what other things that you can do to help prevent falls. This information is not intended to replace advice given to you by your health care provider. Make sure you discuss any questions you have with your health care provider. Document Released: 11/12/2008 Document Revised: 06/24/2015 Document Reviewed: 02/20/2014 Elsevier Interactive Patient Education  2017 [employer].

## 2019-12-05 NOTE — Progress Notes (Signed)
Subjective:   Anthony Cuevas is a 69 y.o. male who presents for Medicare Annual/Subsequent preventive examination.  Review of Systems: N/A      I connected with the patient today by telephone and verified that I am speaking with the correct person using two identifiers. Location patient: home Location nurse: work Persons participating in the telephone visit: patient, nurse.   I discussed the limitations, risks, security and privacy concerns of performing an evaluation and management service by telephone and the availability of in person appointments. I also discussed with the patient that there may be a patient responsible charge related to this service. The patient expressed understanding and verbally consented to this telephonic visit.       Cardiac Risk Factors include: advanced age (>94men, >32 women);male gender;diabetes mellitus;hypertension;Other (see comment), Risk factor comments: hyperlipidemia     Objective:    Today's Vitals   12/05/19 1443  PainSc: 10-Worst pain ever   There is no height or weight on file to calculate BMI.  Advanced Directives 12/05/2019 09/03/2019 12/04/2018 11/26/2017 11/23/2016 08/06/2015 06/25/2015  Does Patient Have a Medical Advance Directive? No No No No No No No  Would patient like information on creating a medical advance directive? Yes (MAU/Ambulatory/Procedural Areas - Information given) No - Patient declined No - Patient declined No - Patient declined Yes (MAU/Ambulatory/Procedural Areas - Information given) Yes - Educational materials given No - patient declined information  Pre-existing out of facility DNR order (yellow form or pink MOST form) - - - - - - -    Current Medications (verified) Outpatient Encounter Medications as of 12/05/2019  Medication Sig   aspirin EC 81 MG tablet Take 1 tablet (81 mg total) by mouth daily. Swallow whole.   Cholecalciferol (VITAMIN D3) 125 MCG (5000 UT) CAPS Take 5,000 Units by mouth 2 (two) times a week.    clopidogrel (PLAVIX) 75 MG tablet Take 1 tablet (75 mg total) by mouth daily.   Continuous Blood Gluc Sensor (FREESTYLE LIBRE 14 DAY SENSOR) MISC 1 Device by Does not apply route every 14 (fourteen) days.   cyanocobalamin 1000 MCG tablet Take 1,000 mcg by mouth daily.   cyclobenzaprine (FLEXERIL) 10 MG tablet Take 10 mg by mouth 3 (three) times daily as needed for muscle spasms.   Insulin NPH, Human,, Isophane, (NOVOLIN N FLEXPEN) 100 UNIT/ML Kiwkpen 180 units each morning, and 30 units each evening, and pen needles 3/day.   latanoprost (XALATAN) 0.005 % ophthalmic solution 1 drop at bedtime.   lisinopril (ZESTRIL) 40 MG tablet Take 1 tablet (40 mg total) by mouth daily.   LUMIGAN 0.01 % SOLN Place 1 drop into both eyes at bedtime.    metFORMIN (GLUCOPHAGE) 1000 MG tablet TAKE 1 TABLET TWICE A DAY WITH MEALS   niacin (NIASPAN) 500 MG CR tablet TAKE 1 TABLET AT BEDTIME   Omega-3 Fatty Acids (FISH OIL PO) Take 1,000 mg by mouth once a week.   ONETOUCH ULTRA test strip USE AS INSTRUCTED TO TEST BLOOD SUGARS 3 OR 4 TIMES DAILY   SURE COMFORT PEN NEEDLES 32G X 4 MM MISC USE THREE TIMES A DAY   traMADol (ULTRAM) 50 MG tablet TAKE 1 TABLET EVERY 8 HOURS AS NEEDED FOR CHRONIC JOINT/BACK PAIN (SEDATION CAUTION)   pravastatin (PRAVACHOL) 80 MG tablet TAKE 1 TABLET DAILY (Patient not taking: Reported on 12/05/2019)   No facility-administered encounter medications on file as of 12/05/2019.    Allergies (verified) Patient has no known allergies.   History: Past  Medical History:  Diagnosis Date   Arthritis    Coronary atherosclerosis    a. 07/2018 CT Chest: 3 vessel coronary atherosclerosis; b. 07/2018 MV: EF 46% (GI uptake artifact), No ischemia/infarct. Mild to mod diffuse Ao atherosclerosis and at least moderate 2 vessel CAD (LAD/RCA) on CT imaging. Low risk.   Depression    improved on sertraline   Diabetes mellitus, type 2 ([place]) 09/1996   GERD (gastroesophageal reflux disease)  1990   History of peptic ulcer disease    Hyperlipidemia 1992   Hypertension 05/2003   Lumbar stenosis L2-3   Muscle atrophy    in arms from degenerative changes in neck   Neck pain    chronic   Smoker    Wears glasses    Past Surgical History:  Procedure Laterality Date   ABDOMINAL AORTOGRAM W/LOWER EXTREMITY N/A 09/03/2019   Procedure: ABDOMINAL AORTOGRAM W/LOWER EXTREMITY;  Surgeon: Wellington Hampshire, MD;  Location: [facility];  Service: Cardiovascular;  Laterality: N/A;  Limited Study   CERVICAL DISC SURGERY  04/19/02   fusion, Dr. Lorin Mercy   CERVICAL DISCECTOMY  1997   partial   CERVICAL DISCECTOMY  2000   COLONOSCOPY WITH ESOPHAGOGASTRODUODENOSCOPY (EGD)  06/25/2015   HEMORRHOID SURGERY  1989   KNEE ARTHROPLASTY  11/20/2011   Procedure: COMPUTER ASSISTED TOTAL KNEE ARTHROPLASTY;  Surgeon: Marybelle Killings, MD;  Location: [facility];  Service: Orthopedics;  Laterality: Left;  Conversion Left Knee Medial Uni to Total Knee Arthroplasty-Cemented   LUMBAR LAMINECTOMY/DECOMPRESSION MICRODISCECTOMY N/A 04/28/2015   Procedure: L2-3 Decompression;  Surgeon: Marybelle Killings, MD;  Location: [facility];  Service: Orthopedics;  Laterality: N/A;   MR MRA DUPLICATE EXAM  INACTIVATE     MULTIPLE TOOTH EXTRACTIONS     Neisen funduplication  7829   PERIPHERAL VASCULAR INTERVENTION  09/03/2019   Procedure: PERIPHERAL VASCULAR INTERVENTION;  Surgeon: Wellington Hampshire, MD;  Location: [facility];  Service: Cardiovascular;;  Iliac Stent   REPLACEMENT TOTAL KNEE  02/14/02, 05/04/02   left- partial   SCC EXCISION  04/25/01   Dr. March Rummage   STOMACH SURGERY  1984   PUD, [place]   Family History  Problem Relation Age of Onset   Diabetes Mother    Hypertension Mother    Stroke Mother    Diabetes Sister    Diabetes Brother    Diabetes Brother    Heart disease Brother    Diabetes Father    Breast cancer Maternal Grandmother        breast   Lung disease Paternal Grandfather         black lung   Arthritis Neg Hx    Prostate cancer Neg Hx    Colon cancer Neg Hx    Stomach cancer Neg Hx    Social History   Socioeconomic History   Marital status: Married    Spouse name: Not on file   Number of children: 3   Years of education: Not on file   Highest education level: Not on file  Occupational History   Occupation: Training and development officer, retired    Comment: Scientific laboratory technician  Tobacco Use   Smoking status: Former Smoker    Packs/day: 1.00    Years: 41.00    Pack years: 41.00    Types: Cigarettes    Quit date: 2014    Years since quitting: 7.8   Smokeless tobacco: Never Used  Scientific laboratory technician Use: Never used  Substance and Sexual Activity  Alcohol use: No    Alcohol/week: 0.0 standard drinks   Drug use: No   Sexual activity: Never  Other Topics Concern   Not on file  Social History Narrative   Retired Education officer, museum, [employer], aviation fuel, [phone], no known agent orange exposure but 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   Married 1975   3 kids   Social Determinants of Health   Financial Resource Strain: Low Risk    Difficulty of Paying Living Expenses: Not hard at all  Food Insecurity: No Food Insecurity   Worried About Charity fundraiser in the Last Year: Never true   Arboriculturist in the Last Year: Never true  Transportation Needs: No Transportation Needs   Lack of Transportation (Medical): No   Lack of Transportation (Non-Medical): No  Physical Activity: Insufficiently Active   Days of Exercise per Week: 2 days   Minutes of Exercise per Session: 30 min  Stress: No Stress Concern Present   Feeling of Stress : Not at all  Social Connections:    Frequency of Communication with Friends and Family: Not on file   Frequency of Social Gatherings with Friends and Family: Not on file   Attends Religious Services: Not on Electrical engineer or Organizations: Not on file   Attends Archivist Meetings:  Not on file   Marital Status: Not on file    Tobacco Counseling Counseling given: Not Answered   Clinical Intake:  Pre-visit preparation completed: Yes  Pain : 0-10 Pain Score: 10-Worst pain ever Pain Type: Neuropathic pain Pain Location: Back Pain Orientation: Lower Pain Descriptors / Indicators: Aching Pain Onset: More than a month ago Pain Frequency: Intermittent     Nutritional Risks: None Diabetes: Yes CBG done?: No Did pt. bring in CBG monitor from home?: No  How often do you need to have someone help you when you read instructions, pamphlets, or other written materials from your doctor or pharmacy?: 1 - Never What is the last grade level you completed in school?: GED  Diabetic: Yes Nutrition Risk Assessment:  Has the patient had any N/V/D within the last 2 months?  No  Does the patient have any non-healing wounds?  No  Has the patient had any unintentional weight loss or weight gain?  No   Diabetes:  Is the patient diabetic?  Yes  If diabetic, was a CBG obtained today?  No  Did the patient bring in their glucometer from home?  N/A, telephone visit  How often do you monitor your CBG's? 3-4 times daily.   Financial Strains and Diabetes Management:  Are you having any financial strains with the device, your supplies or your medication? No .  Does the patient want to be seen by Chronic Care Management for management of their diabetes?  No  Would the patient like to be referred to a Nutritionist or for Diabetic Management?  No     Interpreter Needed?: No  Information entered by :: CJohnson, LPN   Activities of Daily Living In your present state of health, do you have any difficulty performing the following activities: 12/05/2019  Hearing? N  Vision? N  Difficulty concentrating or making decisions? N  Walking or climbing stairs? N  Dressing or bathing? N  Doing errands, shopping? N  Preparing Food and eating ? N  Using the Toilet? N  In the past  six months, have you accidently leaked urine? N  Do you have  problems with loss of bowel control? N  Managing your Medications? N  Managing your Finances? N  Housekeeping or managing your Housekeeping? N  Some recent data might be hidden    Patient Care Team: Tonia Ghent, MD as PCP - General (Family Medicine) Minna Merritts, MD as PCP - Cardiology (Cardiology) Marybelle Killings, MD as Consulting Physician (Orthopedic Surgery) Zehr, Laban Emperor, PA-C as Physician Assistant (Gastroenterology) Gardiner Barefoot, DPM as Consulting Physician (Podiatry) Thelma Comp, [place] as Referring Physician (Optometry) Renato Shin, MD as Consulting Physician (Endocrinology)  Indicate any recent Medical Services you may have received from other than [facility] providers in the past year (date may be approximate).     Assessment:   This is a routine wellness examination for [place].  Hearing/Vision screen  Hearing Screening   125Hz  250Hz  500Hz  1000Hz  2000Hz  3000Hz  4000Hz  6000Hz  8000Hz   Right ear:           Left ear:           Vision Screening Comments: Patient gets annual eye exams   Dietary issues and exercise activities discussed: Current Exercise Habits: Home exercise routine, Type of exercise: walking, Time (Minutes): 30, Frequency (Times/Week): 2, Weekly Exercise (Minutes/Week): 60, Intensity: Moderate, Exercise limited by: None identified  Goals     Increase physical activity     Starting 11/26/2017, I will continue to walk at least 30 min 2 days per week as tolerated.      Patient Stated     12/04/2018, I will maintain and continue medications as prescribed.      Patient Stated     12/05/2019, I will continue to walk 2 days a week for about 30 minutes.       Depression Screen PHQ 2/9 Scores 12/05/2019 12/04/2018 11/26/2017 11/23/2016 08/06/2015 06/12/2013  PHQ - 2 Score 0 0 0 0 0 0  PHQ- 9 Score 0 0 0 0 - -    Fall Risk Fall Risk  12/05/2019 12/04/2018 11/26/2017 11/23/2016 08/06/2015    Falls in the past year? 0 1 No No Yes  Comment - tripped coming in door - - fell while on riding lawn mower  Number falls in past yr: 0 0 - - 1  Injury with Fall? 0 0 - - Yes  Risk Factor Category  - - - - High Fall Risk  Risk for fall due to : Medication side effect Medication side effect - - -  Follow up Falls evaluation completed;Falls prevention discussed Falls evaluation completed;Falls prevention discussed - - Falls evaluation completed    Any stairs in or around the home? Yes  If so, are there any without handrails? No  Home free of loose throw rugs in walkways, pet beds, electrical cords, etc? Yes  Adequate lighting in your home to reduce risk of falls? Yes   ASSISTIVE DEVICES UTILIZED TO PREVENT FALLS:  Life alert? No  Use of a cane, walker or w/c? No  Grab bars in the bathroom? No  Shower chair or bench in shower? No  Elevated toilet seat or a handicapped toilet? No   TIMED UP AND GO:  Was the test performed? N/A, telephonic visit.   Cognitive Function: MMSE - Mini Mental State Exam 12/05/2019 12/04/2018 11/26/2017 11/23/2016 08/06/2015  Orientation to time 5 5 5 5 5   Orientation to Place 5 5 5 5 5   Registration 3 3 3 3 3   Attention/ Calculation 5 4 0 0 0  Recall 3 3 3 2 3   Recall-comments - - -  unable to recall 1 of 3 words -  Language- name 2 objects - - 0 0 0  Language- repeat 1 1 1 1 1   Language- follow 3 step command - - 3 3 3   Language- read & follow direction - - 0 0 0  Write a sentence - - 0 0 0  Copy design - - 0 0 0  Total score - - 20 19 20   Mini Cog  Mini-Cog screen was completed. Maximum score is 22. A value of 0 denotes this part of the MMSE was not completed or the patient failed this part of the Mini-Cog screening.       Immunizations Immunization History  Administered Date(s) Administered   Fluad Quad(high Dose 65+) 10/02/2018, 10/16/2019   H1N1 01/30/2008   Influenza Split 12/08/2010, 10/20/2011   Influenza Whole 12/03/2008    Influenza,inj,Quad PF,6+ Mos 12/06/2012, 12/15/2013, 11/17/2014, 11/16/2015, 11/23/2016, 11/26/2017   PFIZER SARS-COV-2 Vaccination 03/30/2019, 04/21/2019, 10/29/2019   Pneumococcal Conjugate-13 11/26/2017   Pneumococcal Polysaccharide-23 06/12/2013, 12/06/2018   Td 09/06/2001   Tdap 12/07/2011    TDAP status: Up to date Flu Vaccine status: Up to date Pneumococcal vaccine status: Up to date Covid-19 vaccine status: Completed vaccines  Qualifies for Shingles Vaccine? Yes   Zostavax completed No   Shingrix Completed?: No.    Education has been provided regarding the importance of this vaccine. Patient has been advised to call insurance company to determine out of pocket expense if they have not yet received this vaccine. Advised may also receive vaccine at local pharmacy or Health Dept. Verbalized acceptance and understanding.  Screening Tests Health Maintenance  Topic Date Due   HEMOGLOBIN A1C  04/05/2020   COLONOSCOPY  06/24/2020   OPHTHALMOLOGY EXAM  08/10/2020   FOOT EXAM  10/06/2020   TETANUS/TDAP  12/06/2021   INFLUENZA VACCINE  Completed   COVID-19 Vaccine  Completed   Hepatitis C Screening  Completed   PNA vac Low Risk Adult  Completed    Health Maintenance  There are no preventive care reminders to display for this patient.  Colorectal cancer screening: Completed 06/25/2015. Repeat every 5 years  Lung Cancer Screening: (Low Dose CT Chest recommended if Age 48-80 years, 30 pack-year currently smoking OR have quit w/in 15 years.) does not qualify.    Additional Screening:  Hepatitis C Screening: does qualify; Completed 02/12/2015  Vision Screening: Recommended annual ophthalmology exams for early detection of glaucoma and other disorders of the eye. Is the patient up to date with their annual eye exam?  Yes  Who is the provider or what is the name of the office in which the patient attends annual eye exams? [facility]  If pt is not  established with a provider, would they like to be referred to a provider to establish care? No .   Dental Screening: Recommended annual dental exams for proper oral hygiene  Community Resource Referral / Chronic Care Management: CRR required this visit?  No   CCM required this visit?  No      Plan:     I have personally reviewed and noted the following in the patients chart:    Medical and social history  Use of alcohol, tobacco or illicit drugs   Current medications and supplements  Functional ability and status  Nutritional status  Physical activity  Advanced directives  List of other physicians  Hospitalizations, surgeries, and ER visits in previous 12 months  Vitals  Screenings to include cognitive, depression, and falls  Referrals and appointments  In addition, I have reviewed and discussed with patient certain preventive protocols, quality metrics, and best practice recommendations. A written personalized care plan for preventive services as well as general preventive health recommendations were provided to patient.   Due to this being a telephonic visit, the after visit summary with patients personalized plan was offered to patient via office or my-chart. Patient preferred to pick up at office at next visit or via mychart.   Andrez Grime, LPN   [id number]

## 2019-12-05 NOTE — Progress Notes (Signed)
PCP notes:  Health Maintenance: No gaps noted   Abnormal Screenings: none   Patient concerns: none   Nurse concerns: none   Next PCP appt.: 12/11/2019 @ 11 am

## 2019-12-08 ENCOUNTER — Other Ambulatory Visit: Payer: Self-pay | Admitting: Family Medicine

## 2019-12-08 NOTE — Telephone Encounter (Signed)
Pharmacy requests refill on: Niacin ER 500 MG  LAST REFILL: 03/11/2019 LAST OV: 10/16/2019 NEXT OV: 12/11/2019 PHARMACY: [facility] [place], [place]

## 2019-12-11 ENCOUNTER — Encounter: Payer: Self-pay | Admitting: Family Medicine

## 2019-12-11 ENCOUNTER — Other Ambulatory Visit: Payer: Self-pay

## 2019-12-11 ENCOUNTER — Ambulatory Visit (INDEPENDENT_AMBULATORY_CARE_PROVIDER_SITE_OTHER): Payer: Medicare Other | Admitting: Family Medicine

## 2019-12-11 VITALS — BP 150/80 | HR 93 | Temp 97.6°F | Ht 67.5 in | Wt 214.4 lb

## 2019-12-11 DIAGNOSIS — D509 Iron deficiency anemia, unspecified: Secondary | ICD-10-CM

## 2019-12-11 DIAGNOSIS — M545 Low back pain, unspecified: Secondary | ICD-10-CM

## 2019-12-11 DIAGNOSIS — E11649 Type 2 diabetes mellitus with hypoglycemia without coma: Secondary | ICD-10-CM | POA: Diagnosis not present

## 2019-12-11 DIAGNOSIS — H401131 Primary open-angle glaucoma, bilateral, mild stage: Secondary | ICD-10-CM | POA: Diagnosis not present

## 2019-12-11 DIAGNOSIS — R7989 Other specified abnormal findings of blood chemistry: Secondary | ICD-10-CM

## 2019-12-11 DIAGNOSIS — Z0000 Encounter for general adult medical examination without abnormal findings: Secondary | ICD-10-CM

## 2019-12-11 DIAGNOSIS — E782 Mixed hyperlipidemia: Secondary | ICD-10-CM

## 2019-12-11 DIAGNOSIS — Z7189 Other specified counseling: Secondary | ICD-10-CM

## 2019-12-11 MED ORDER — PRAVASTATIN SODIUM 80 MG PO TABS
80.0000 mg | ORAL_TABLET | Freq: Every day | ORAL | 0 refills | Status: DC
Start: 2019-12-11 — End: 2019-12-11

## 2019-12-11 MED ORDER — PRAVASTATIN SODIUM 80 MG PO TABS
80.0000 mg | ORAL_TABLET | Freq: Every day | ORAL | 3 refills | Status: DC
Start: 2019-12-11 — End: 2020-03-23

## 2019-12-11 NOTE — Progress Notes (Signed)
This visit occurred during the SARS-CoV-2 public health emergency.  Safety protocols were in place, including screening questions prior to the visit, additional usage of staff PPE, and extensive cleaning of exam room while observing appropriate contact time as indicated for disinfecting solutions.  TSh elevated. D/w pt. No fatigue.  No dysphagia.  No neck radiation.  No known h/o thyroid disorder in the family.    His is still having lower back pain. Off statin, but still having pain.  Tramadol and flexeril help some.  He isn't pain free at any point.  He is still stretching.  He had prev injections.  He is off celebrex, has been off.  We talked about ortho follow up. Lower back pain.  No radiation.  No radicular pain.  No weakness.    Discussed with patient about his labs.  His iron level is normal off treatment.  He is not anemic.  Elevated Cholesterol: Off pravastatin but w/o any changes off med.   Muscle aches: yes, lower back pain.   Diet compliance: encouraged.   Exercise: encouraged  Muscle pain is better after angioplasty.    DM2 per endo, A1c 7.6.  I will defer.  He agrees.  Living will d/w pt. Wife designated if patient were incapacitated.  Prostate cancer screening and PSA options(with potential risks and benefits of testing vs not testing) were discussed along with recent recs/guidelines. He declined testing PSAat this point. Colonoscopy 2017 Flu UTD.  Tdap 2013 PNA 2020.   covid vaccine 2021 Shingrix d/w pt.  He is checking with insurance about coverage.   PMH and SH reviewed  Meds, vitals, and allergies reviewed.   ROS: Per HPI unless specifically indicated in ROS section   GEN: nad, alert and oriented HEENT: ncat NECK: supple w/o LA CV: rrr. PULM: ctab, no inc wob ABD: soft, +bs EXT: no edema SKIN: no acute rash  At least 30 minutes were devoted to patient care in this encounter (this can potentially include time spent reviewing the patient's file/history,  interviewing and examining the patient, counseling/reviewing plan with patient, ordering referrals, ordering tests, reviewing relevant laboratory or x-ray data, and documenting the encounter).

## 2019-12-11 NOTE — Patient Instructions (Signed)
I put in the referral to see ortho.  Restart pravastatin.  Update me as needed.  I'll update Dr. Loanne Drilling about your thyroid test. I suspect he'll want to recheck it.  Take care.  Glad to see you.

## 2019-12-15 DIAGNOSIS — R7989 Other specified abnormal findings of blood chemistry: Secondary | ICD-10-CM | POA: Insufficient documentation

## 2019-12-15 NOTE — Assessment & Plan Note (Signed)
TSh elevated. D/w pt. No fatigue.  No dysphagia.  No neck radiation.  No known h/o thyroid disorder in the family.    I will update endocrinology as I suspect it would be reasonable to recheck this at endocrinology follow-up visit.  I did not yet start the patient on medication.

## 2019-12-15 NOTE — Assessment & Plan Note (Signed)
Per endocrinology.  I will defer. 

## 2019-12-15 NOTE — Assessment & Plan Note (Signed)
Living will d/w pt. Wife designated if patient were incapacitated.  Prostate cancer screening and PSA options(with potential risks and benefits of testing vs not testing) were discussed along with recent recs/guidelines. He declined testing PSAat this point. Colonoscopy 2017 Flu UTD.  Tdap 2013 PNA 2020.   covid vaccine 2021 Shingrix d/w pt.  He is checking with insurance about coverage.

## 2019-12-15 NOTE — Assessment & Plan Note (Signed)
History of. Discussed with patient about his labs.  His iron level is normal off treatment.  He is not anemic. Would continue off replacement at this point.

## 2019-12-15 NOTE — Assessment & Plan Note (Signed)
Refer to Ortho.  He agrees.  Stopping statin did not help his pain.

## 2019-12-15 NOTE — Assessment & Plan Note (Signed)
Living will d/w pt.  Wife designated if patient were incapacitated.   ?

## 2019-12-15 NOTE — Assessment & Plan Note (Signed)
Would restart pravastatin.

## 2019-12-17 ENCOUNTER — Other Ambulatory Visit: Payer: Self-pay

## 2019-12-17 ENCOUNTER — Ambulatory Visit (INDEPENDENT_AMBULATORY_CARE_PROVIDER_SITE_OTHER): Payer: Medicare Other | Admitting: Orthopaedic Surgery

## 2019-12-17 ENCOUNTER — Encounter: Payer: Self-pay | Admitting: Orthopaedic Surgery

## 2019-12-17 ENCOUNTER — Ambulatory Visit (INDEPENDENT_AMBULATORY_CARE_PROVIDER_SITE_OTHER): Payer: Medicare Other | Admitting: Podiatry

## 2019-12-17 ENCOUNTER — Encounter: Payer: Self-pay | Admitting: Podiatry

## 2019-12-17 VITALS — BP 161/68 | HR 103 | Ht 70.0 in | Wt 214.0 lb

## 2019-12-17 DIAGNOSIS — M2010 Hallux valgus (acquired), unspecified foot: Secondary | ICD-10-CM

## 2019-12-17 DIAGNOSIS — Z9889 Other specified postprocedural states: Secondary | ICD-10-CM

## 2019-12-17 DIAGNOSIS — B351 Tinea unguium: Secondary | ICD-10-CM

## 2019-12-17 DIAGNOSIS — E119 Type 2 diabetes mellitus without complications: Secondary | ICD-10-CM

## 2019-12-17 DIAGNOSIS — M79676 Pain in unspecified toe(s): Secondary | ICD-10-CM

## 2019-12-17 DIAGNOSIS — Z794 Long term (current) use of insulin: Secondary | ICD-10-CM | POA: Diagnosis not present

## 2019-12-17 NOTE — Progress Notes (Signed)
Office Visit Note   Patient: Anthony Cuevas           Date of Birth: [birth year]           MRN: [id number] Visit Date: 12/17/2019              Requested by: Tonia Ghent, MD [address] [place],  [place] [address] PCP: Tonia Ghent, MD   Assessment & Plan: Visit Diagnoses:  1. History of lumbar laminectomy for spinal cord decompression     Plan: We reviewed his MRI scan after his 2017 lumbar surgery.  Has good decompression and only mild narrowing at L4-5 lateral recess.  We discussed diabetic care, weight loss, walking program.  He will call if he like a physical therapy referral.  Follow-Up Instructions: No follow-ups on file.   Orders:  No orders of the defined types were placed in this encounter.  No orders of the defined types were placed in this encounter.     Procedures: No procedures performed   Clinical Data: No additional findings.   Subjective: Chief Complaint  Patient presents with  . Lower Back - Pain    HPI 68 69-year-old male returns he has some problems with his back particularly gets down on the ground he has some trouble getting up has to rollover get up on all fours or use a chair.  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  Previous decompression L2-3 and March 2017.  He had a scan several months after the surgery which showed satisfactory decompression.  He did have some mild lateral recess narrowing worse on the right than the left at L4-5 and for a while had some anterior tib weakness which got better and resolved.  He does fine when he standing.  Patient does have diabetes.  He rarely uses Ultram for pain and also Flexeril.  Review of Systems All other systems are noncontributory to HPI.  No fever chills no bowel bladder symptoms.  Objective: Vital Signs: BP (!) 161/68   Pulse (!) 103   Ht 5\' 10"  (1.778 m)   Wt 214 lb (97.1 kg)   BMI 30.71  kg/m   Physical Exam Constitutional:      Appearance: He is well-developed.  HENT:     Head: Normocephalic and atraumatic.  Eyes:     Pupils: Pupils are equal, round, and reactive to light.  Neck:     Thyroid: No thyromegaly.     Trachea: No tracheal deviation.  Cardiovascular:     Rate and Rhythm: Normal rate.  Pulmonary:     Effort: Pulmonary effort is normal.     Breath sounds: No wheezing.  Abdominal:     General: Bowel sounds are normal.     Palpations: Abdomen is soft.  Skin:    General: Skin is warm and dry.     Capillary Refill: Capillary refill takes less than 2 seconds.  Neurological:     Mental Status: He is alert and oriented to person, place, and time.  Psychiatric:        Behavior: Behavior normal.        Thought Content: Thought content normal.        Judgment: Judgment normal.     Ortho Exam anterior tib strong right and left lumbar incisions well-healed negative logroll the hips.  Normal heel toe gait. Specialty Comments:  No specialty  comments available.  Imaging: No results found.   PMFS History: Patient Active Problem List   Diagnosis Date Noted  . Abnormal TSH 12/15/2019  . Decreased pulses in feet 07/06/2019  . Left hip pain 07/06/2019  . Left hand pain 07/10/2018  . Lung nodule 07/10/2018  . Lower back pain 07/10/2018  . CAD (coronary artery disease) 01/06/2018  . Joint pain 12/06/2017  . Vertigo 12/06/2017  . Trochanteric bursitis, left hip 03/21/2017  . Healthcare maintenance 12/03/2016  . Neck pain 12/03/2016  . Depression 08/16/2015  . Anemia, iron deficiency 05/21/2015  . IDDM (insulin dependent diabetes mellitus) 05/21/2015  . History of lumbar laminectomy for spinal cord decompression 04/28/2015  . Advance care planning 07/17/2014  . Lumbar radiculopathy 04/17/2014  . Medicare annual wellness visit, subsequent 12/07/2011  . Osteoarthritis of left knee 11/20/2011    Class: End Stage  . ED (erectile dysfunction) 06/01/2010    . MUSCULAR WASTING AND DISUSE ATROPHY NEC 06/09/2009  . Vitamin D deficiency 01/28/2009  . CARPAL TUNNEL SYNDROME, BILATERAL 07/13/2006  . Diabetes mellitus type II, uncontrolled ([place]) 06/25/2006  . HLD (hyperlipidemia) 06/25/2006  . Essential hypertension 06/25/2006  . Cervical disc disorder 06/22/2006  . TENDINITIS, CALCIFIC, SHOULDER, LEFT 06/22/2006  . ELEVATED PROSTATE SPECIFIC ANTIGEN 06/22/2006   Past Medical History:  Diagnosis Date  . Arthritis   . Coronary atherosclerosis    a. 07/2018 CT Chest: 3 vessel coronary atherosclerosis; b. 07/2018 MV: EF 46% (GI uptake artifact), No ischemia/infarct. Mild to mod diffuse Ao atherosclerosis and at least moderate 2 vessel CAD (LAD/RCA) on CT imaging. Low risk.  . Depression    improved on sertraline  . Diabetes mellitus, type 2 ([place]) 09/1996  . GERD (gastroesophageal reflux disease) 1990  . History of peptic ulcer disease   . Hyperlipidemia 1992  . Hypertension 05/2003  . Lumbar stenosis L2-3  . Muscle atrophy    in arms from degenerative changes in neck  . Neck pain    chronic  . Smoker   . Wears glasses     Family History  Problem Relation Age of Onset  . Diabetes Mother   . Hypertension Mother   . Stroke Mother   . Diabetes Sister   . Diabetes Brother   . Diabetes Brother   . Heart disease Brother   . Diabetes Father   . Breast cancer Maternal Grandmother        breast  . Lung disease Paternal Grandfather        black lung  . Arthritis Neg Hx   . Prostate cancer Neg Hx   . Colon cancer Neg Hx   . Stomach cancer Neg Hx     Past Surgical History:  Procedure Laterality Date  . ABDOMINAL AORTOGRAM W/LOWER EXTREMITY N/A 09/03/2019   Procedure: ABDOMINAL AORTOGRAM W/LOWER EXTREMITY;  Surgeon: Wellington Hampshire, MD;  Location: [facility];  Service: Cardiovascular;  Laterality: N/A;  Limited Study  . CERVICAL DISC SURGERY  04/19/02   fusion, Dr. Lorin Mercy  . CERVICAL DISCECTOMY  1997   partial  . CERVICAL DISCECTOMY   2000  . COLONOSCOPY WITH ESOPHAGOGASTRODUODENOSCOPY (EGD)  06/25/2015  . [place]  . KNEE ARTHROPLASTY  11/20/2011   Procedure: COMPUTER ASSISTED TOTAL KNEE ARTHROPLASTY;  Surgeon: Marybelle Killings, MD;  Location: [address];  Service: Orthopedics;  Laterality: Left;  Conversion Left Knee Medial Uni to Total Knee Arthroplasty-Cemented  . LUMBAR LAMINECTOMY/DECOMPRESSION MICRODISCECTOMY N/A 04/28/2015   Procedure: L2-3 Decompression;  Surgeon:  Marybelle Killings, MD;  Location: [facility];  Service: Orthopedics;  Laterality: N/A;  . MR MRA DUPLICATE EXAM  INACTIVATE    . MULTIPLE TOOTH EXTRACTIONS    . Neisen funduplication  6803  . PERIPHERAL VASCULAR INTERVENTION  09/03/2019   Procedure: PERIPHERAL VASCULAR INTERVENTION;  Surgeon: Wellington Hampshire, MD;  Location: [facility];  Service: Cardiovascular;;  Iliac Stent  . REPLACEMENT TOTAL KNEE  02/14/02, 05/04/02   left- partial  . SCC EXCISION  04/25/01   Dr. March Rummage  . STOMACH SURGERY  1984   P[facility], [place]   Social History   Occupational History  . Occupation: Training and development officer, retired    Comment: Scientific laboratory technician  Tobacco Use  . Smoking status: Former Smoker    Packs/day: 1.00    Years: 41.00    Pack years: 41.00    Types: Cigarettes    Quit date: 2014    Years since quitting: 7.8  . Smokeless tobacco: Never Used  Vaping Use  . Vaping Use: Never used  Substance and Sexual Activity  . Alcohol use: No    Alcohol/week: 0.0 standard drinks  . Drug use: No  . Sexual activity: Never

## 2019-12-17 NOTE — Progress Notes (Signed)
This patient returns to my office for at risk foot care.  This patient requires this care by a professional since this patient will be at risk due to having type 2 diabetes.  This patient is unable to cut nails himself since the patient cannot reach his nails.These nails are painful walking and wearing shoes.  This patient presents for at risk foot care today.  General Appearance  Alert, conversant and in no acute stress.  Vascular  Dorsalis pedis and posterior tibial  pulses are palpable  bilaterally.  Capillary return is within normal limits  bilaterally. Temperature is within normal limits  bilaterally.  Neurologic  Senn-Weinstein monofilament wire test diminished  bilaterally. Muscle power within normal limits bilaterally.  Nails Thick disfigured discolored nails with subungual debris  from hallux to fifth toes bilaterally. No evidence of bacterial infection or drainage bilaterally.  Orthopedic  No limitations of motion  feet .  No crepitus or effusions noted.  No bony pathology or digital deformities noted.  HAV  B/L.  Skin  normotropic skin with no porokeratosis noted bilaterally.  No signs of infections or ulcers noted.     Onychomycosis  Pain in right toes  Pain in left toes  Consent was obtained for treatment procedures.   Mechanical debridement of nails 1-5  bilaterally performed with a nail nipper.  Filed with dremel without incident.    Return office visit    10   weeks                  Told patient to return for periodic foot care and evaluation due to potential at risk complications.   Latoy Labriola DPM  

## 2020-01-07 NOTE — Progress Notes (Signed)
Date:  01/12/2020   ID:  [name], DOB 14-Jan-1951, MRN [id number]  Patient Location:  [place] [address]   Provider location:   [facility], [place] office  PCP:  Tonia Ghent, MD  Cardiologist:  Arvid Right [facility]  Chief Complaint  Patient presents with  . Follow-up    6 month F/U; Meds verbally reviewed with patient.    History of Present Illness:    Anthony Cuevas is a 69 y.o. male  past medical history of  former smoker, >20 years, quit >8 years ago Coronary artery disease on CT scan Hyperlipidemia Diabetes II PAD, stent to left LE Presents for f/u of his  coronary artery disease, shortness of breath  In general he reports he is doing well No claudication, particularly left leg Breathing stable, active at baseline but no regular exercise Takes care of some chickens and rabbits No angina Chronic mild shortness of breath  Reviewed prior lower extremity intervention noninvasive vascular studies which showed an ABI of 0.99 on the right and 0.54 in the left.  Duplex showed moderate right iliac disease .  On the left, the common iliac artery was occluded proximally into the mid segment with no evidence of infrainguinal disease with the exception of occluded anterior tibial artery.  angiography several months ago which showed moderate right common iliac artery stenosis, occluded and heavily calcified left common iliac artery in a long segment.   -successful angioplasty and covered stent placement to the left common iliac artery.  Postprocedure vascular studies showed improvement in ABI to normal with patent iliac stent and moderately elevated velocities just distal to the stent.  Prior imaging reviewed CT scan chest July 29, 2018 with at least moderate three-vessel coronary atherosclerosis, Moderate diffuse aortic atherosclerosis Emphysema  Lab work reviewed with him on todays visit Hemoglobin A1c used to be  9.9, Now 7.6 LDL 78 Total cholesterol 135  Discussed issues with his grandchild, 75 years old needing what sounds like aortic surgery or cardiac surgery  EKG personally reviewed by myself on todays visit NSR rate 100 no ST or T wave changes    Past Medical History:  Diagnosis Date  . Arthritis   . Coronary atherosclerosis    a. 07/2018 CT Chest: 3 vessel coronary atherosclerosis; b. 07/2018 MV: EF 46% (GI uptake artifact), No ischemia/infarct. Mild to mod diffuse Ao atherosclerosis and at least moderate 2 vessel CAD (LAD/RCA) on CT imaging. Low risk.  . Depression    improved on sertraline  . Diabetes mellitus, type 2 ([place]) 09/1996  . GERD (gastroesophageal reflux disease) 1990  . History of peptic ulcer disease   . Hyperlipidemia 1992  . Hypertension 05/2003  . Lumbar stenosis L2-3  . Muscle atrophy    in arms from degenerative changes in neck  . Neck pain    chronic  . Smoker   . Wears glasses    Past Surgical History:  Procedure Laterality Date  . ABDOMINAL AORTOGRAM W/LOWER EXTREMITY N/A 09/03/2019   Procedure: ABDOMINAL AORTOGRAM W/LOWER EXTREMITY;  Surgeon: Wellington Hampshire, MD;  Location: [facility];  Service: Cardiovascular;  Laterality: N/A;  Limited Study  . CERVICAL DISC SURGERY  04/19/02   fusion, Dr. Lorin Mercy  . CERVICAL DISCECTOMY  1997   partial  . CERVICAL DISCECTOMY  2000  . COLONOSCOPY WITH ESOPHAGOGASTRODUODENOSCOPY (EGD)  06/25/2015  . [place]  . KNEE ARTHROPLASTY  11/20/2011  Procedure: COMPUTER ASSISTED TOTAL KNEE ARTHROPLASTY;  Surgeon: Marybelle Killings, MD;  Location: [facility];  Service: Orthopedics;  Laterality: Left;  Conversion Left Knee Medial Uni to Total Knee Arthroplasty-Cemented  . LUMBAR LAMINECTOMY/DECOMPRESSION MICRODISCECTOMY N/A 04/28/2015   Procedure: L2-3 Decompression;  Surgeon: Marybelle Killings, MD;  Location: [facility];  Service: Orthopedics;  Laterality: N/A;  . MR MRA DUPLICATE EXAM  INACTIVATE    . MULTIPLE TOOTH EXTRACTIONS     . Neisen funduplication  [id number]  . PERIPHERAL VASCULAR INTERVENTION  09/03/2019   Procedure: PERIPHERAL VASCULAR INTERVENTION;  Surgeon: Wellington Hampshire, MD;  Location: [facility];  Service: Cardiovascular;;  Iliac Stent  . REPLACEMENT TOTAL KNEE  02/14/02, 05/04/02   left- partial  . SCC EXCISION  04/25/01   Dr. March Rummage  . STOMACH SURGERY  1984   PUD, [facility]     Current Meds  Medication Sig  . aspirin EC 81 MG tablet Take 1 tablet (81 mg total) by mouth daily. Swallow whole.  . Cholecalciferol (VITAMIN D3) 125 MCG (5000 UT) CAPS Take 5,000 Units by mouth 2 (two) times a week.  . clopidogrel (PLAVIX) 75 MG tablet Take 1 tablet (75 mg total) by mouth daily.  . Continuous Blood Gluc Sensor (FREESTYLE LIBRE 14 DAY SENSOR) MISC 1 Device by Does not apply route every 14 (fourteen) days.  . cyanocobalamin 1000 MCG tablet Take 1,000 mcg by mouth daily.  . cyclobenzaprine (FLEXERIL) 10 MG tablet Take 10 mg by mouth 3 (three) times daily as needed for muscle spasms.  . Insulin NPH, Human,, Isophane, (NOVOLIN N FLEXPEN) 100 UNIT/ML Kiwkpen 180 units each morning, and 30 units each evening, and pen needles 3/day.  . latanoprost (XALATAN) 0.005 % ophthalmic solution 1 drop at bedtime.  Marland Kitchen lisinopril (ZESTRIL) 40 MG tablet Take 1 tablet (40 mg total) by mouth daily.  . metFORMIN (GLUCOPHAGE) 1000 MG tablet TAKE 1 TABLET TWICE A DAY WITH MEALS  . niacin (NIASPAN) 500 MG CR tablet TAKE 1 TABLET AT BEDTIME  . Omega-3 Fatty Acids (FISH OIL PO) Take 1,000 mg by mouth once a week.  Glory Rosebush ULTRA test strip USE AS INSTRUCTED TO TEST BLOOD SUGARS 3 OR 4 TIMES DAILY  . pravastatin (PRAVACHOL) 80 MG tablet Take 1 tablet (80 mg total) by mouth daily.  . SURE COMFORT PEN NEEDLES 32G X 4 MM MISC USE THREE TIMES A DAY  . traMADol (ULTRAM) 50 MG tablet TAKE 1 TABLET EVERY 8 HOURS AS NEEDED FOR CHRONIC JOINT/BACK PAIN (SEDATION CAUTION)     Allergies:   Patient has no known allergies.   Social History    Tobacco Use  . Smoking status: Former Smoker    Packs/day: 1.00    Years: 41.00    Pack years: 41.00    Types: Cigarettes    Quit date: 2014    Years since quitting: 7.9  . Smokeless tobacco: Never Used  Vaping Use  . Vaping Use: Never used  Substance Use Topics  . Alcohol use: No    Alcohol/week: 0.0 standard drinks  . Drug use: No     Current Outpatient Medications on File Prior to Visit  Medication Sig Dispense Refill  . aspirin EC 81 MG tablet Take 1 tablet (81 mg total) by mouth daily. Swallow whole. 90 tablet 3  . Cholecalciferol (VITAMIN D3) 125 MCG (5000 UT) CAPS Take 5,000 Units by mouth 2 (two) times a week.    . clopidogrel (PLAVIX) 75 MG tablet Take 1  tablet (75 mg total) by mouth daily. 90 tablet 3  . Continuous Blood Gluc Sensor (FREESTYLE LIBRE 14 DAY SENSOR) MISC 1 Device by Does not apply route every 14 (fourteen) days. 6 each 3  . cyanocobalamin 1000 MCG tablet Take 1,000 mcg by mouth daily.    . cyclobenzaprine (FLEXERIL) 10 MG tablet Take 10 mg by mouth 3 (three) times daily as needed for muscle spasms.    . Insulin NPH, Human,, Isophane, (NOVOLIN N FLEXPEN) 100 UNIT/ML Kiwkpen 180 units each morning, and 30 units each evening, and pen needles 3/day.    . latanoprost (XALATAN) 0.005 % ophthalmic solution 1 drop at bedtime.    Marland Kitchen lisinopril (ZESTRIL) 40 MG tablet Take 1 tablet (40 mg total) by mouth daily. 90 tablet 3  . metFORMIN (GLUCOPHAGE) 1000 MG tablet TAKE 1 TABLET TWICE A DAY WITH MEALS 180 tablet 3  . niacin (NIASPAN) 500 MG CR tablet TAKE 1 TABLET AT BEDTIME 90 tablet 1  . Omega-3 Fatty Acids (FISH OIL PO) Take 1,000 mg by mouth once a week.    Glory Rosebush ULTRA test strip USE AS INSTRUCTED TO TEST BLOOD SUGARS 3 OR 4 TIMES DAILY 100 each 11  . pravastatin (PRAVACHOL) 80 MG tablet Take 1 tablet (80 mg total) by mouth daily. 90 tablet 3  . SURE COMFORT PEN NEEDLES 32G X 4 MM MISC USE THREE TIMES A DAY 100 each 10  . traMADol (ULTRAM) 50 MG tablet TAKE 1  TABLET EVERY 8 HOURS AS NEEDED FOR CHRONIC JOINT/BACK PAIN (SEDATION CAUTION) 270 tablet 1   No current facility-administered medications on file prior to visit.     Family Hx: The patient's family history includes Breast cancer in his maternal grandmother; Diabetes in his brother, brother, father, mother, and sister; Heart disease in his brother; Hypertension in his mother; Lung disease in his paternal grandfather; Stroke in his mother. There is no history of Arthritis, Prostate cancer, Colon cancer, or Stomach cancer.  ROS:   Please see the history of present illness.    Review of Systems  Constitutional: Negative.   HENT: Negative.   Respiratory: Positive for shortness of breath.   Cardiovascular: Negative.   Gastrointestinal: Negative.   Musculoskeletal: Negative.   Neurological: Negative.   Psychiatric/Behavioral: Negative.   All other systems reviewed and are negative.    Labs/Other Tests and Data Reviewed:    Recent Labs: 12/05/2019: ALT 31; BUN 18; Creatinine, Ser 1.20; Hemoglobin 13.9; Platelets 212.0; Potassium 5.0; Sodium 141; TSH 9.43   Recent Lipid Panel Lab Results  Component Value Date/Time   CHOL 135 12/05/2019 07:53 AM   TRIG 204.0 (H) 12/05/2019 07:53 AM   HDL 34.10 (L) 12/05/2019 07:53 AM   CHOLHDL 4 12/05/2019 07:53 AM   LDLCALC 68 07/13/2014 08:14 AM   LDLDIRECT 78.0 12/05/2019 07:53 AM    Wt Readings from Last 3 Encounters:  01/12/20 218 lb (98.9 kg)  12/17/19 214 lb (97.1 kg)  12/11/19 214 lb 7 oz (97.3 kg)     Exam:    Vital Signs: Vital signs may also be detailed in the HPI BP 130/68 (BP Location: Left Arm, Patient Position: Sitting, Cuff Size: Large)   Pulse (!) 101   Ht 5\' 10"  (1.778 m)   Wt 218 lb (98.9 kg)   SpO2 98%   BMI 31.28 kg/m    Constitutional:  oriented to person, place, and time. No distress.  HENT:  Head: Grossly normal Eyes:  no discharge. No scleral icterus.  Neck: No JVD, no carotid bruits  Cardiovascular: Regular  rate and rhythm, no murmurs appreciated Pulmonary/Chest: Clear to auscultation bilaterally, no wheezes or rails Abdominal: Soft.  no distension.  no tenderness.  Musculoskeletal: Normal range of motion Neurological:  normal muscle tone. Coordination normal. No atrophy Skin: Skin warm and dry Psychiatric: normal affect, pleasant   ASSESSMENT & PLAN:    Anthony Cuevas was seen today for follow-up.  Diagnoses and all orders for this visit:  PAD (peripheral artery disease) ([place])  Coronary artery disease involving native coronary artery of native heart without angina pectoris  Essential hypertension  Hyperlipidemia, unspecified hyperlipidemia type  Claudication in peripheral vascular disease ([place])  Mixed hyperlipidemia  Uncontrolled type 2 diabetes mellitus with hypoglycemia without coma ([place])   CAD with stable angina On asa, plavix, pravastatin Currently with no symptoms of angina. No further workup at this time. Continue current medication regimen.  PAD No claudication sx. Followed by Dr. Fletcher Anon priro intervention on the left U/s pending  Hyperlipidemia On pravastatin, Does not want zetia  Diabetes with complictions We have encouraged continued exercise, careful diet management in an effort to lose weight.    Total encounter time more than 25 minutes  Greater than 50% was spent in counseling and coordination of care with the patient   Signed, Ida Rogue, [place] Office [address], [place], [place] [address]

## 2020-01-12 ENCOUNTER — Ambulatory Visit (INDEPENDENT_AMBULATORY_CARE_PROVIDER_SITE_OTHER): Payer: Medicare Other | Admitting: Cardiovascular Disease

## 2020-01-12 ENCOUNTER — Encounter: Payer: Self-pay | Admitting: Cardiovascular Disease

## 2020-01-12 ENCOUNTER — Other Ambulatory Visit: Payer: Self-pay

## 2020-01-12 VITALS — BP 130/68 | HR 101 | Ht 70.0 in | Wt 218.0 lb

## 2020-01-12 DIAGNOSIS — I10 Essential (primary) hypertension: Secondary | ICD-10-CM | POA: Diagnosis not present

## 2020-01-12 DIAGNOSIS — I739 Peripheral vascular disease, unspecified: Secondary | ICD-10-CM | POA: Diagnosis not present

## 2020-01-12 DIAGNOSIS — I2510 Atherosclerotic heart disease of native coronary artery without angina pectoris: Secondary | ICD-10-CM

## 2020-01-12 DIAGNOSIS — E782 Mixed hyperlipidemia: Secondary | ICD-10-CM

## 2020-01-12 DIAGNOSIS — E785 Hyperlipidemia, unspecified: Secondary | ICD-10-CM

## 2020-01-12 DIAGNOSIS — E11649 Type 2 diabetes mellitus with hypoglycemia without coma: Secondary | ICD-10-CM

## 2020-01-12 NOTE — Patient Instructions (Signed)
Medication Instructions:  No changes  If you need a refill on your cardiac medications before your next appointment, please call your pharmacy.    Lab work: No new labs needed   If you have labs (blood work) drawn today and your tests are completely normal, you will receive your results only by:  [facility] (if you have MyChart) OR  A paper copy in the mail If you have any lab test that is abnormal or we need to change your treatment, we will call you to review the results.   Testing/Procedures: No new testing needed   Follow-Up: At [facility], you and your health needs are our priority.  As part of our continuing mission to provide you with exceptional heart care, we have created designated Provider Care Teams.  These Care Teams include your primary Cardiologist (physician) and Advanced Practice Providers (APPs -  Physician Assistants and Nurse Practitioners) who all work together to provide you with the care you need, when you need it.   You will need a follow up appointment in 12 months   Providers on your designated Care Team:    Murray Hodgkins, NP  Christell Faith, PA-C  Marrianne Mood, PA-C  Any Other Special Instructions Will Be Listed Below (If Applicable).  COVID-19 Vaccine Information can be found at: [email or web address] For questions related to vaccine distribution or appointments, please email [email or web address] or call [phone].

## 2020-01-13 ENCOUNTER — Encounter: Payer: Self-pay | Admitting: Endocrinology

## 2020-01-13 ENCOUNTER — Ambulatory Visit (INDEPENDENT_AMBULATORY_CARE_PROVIDER_SITE_OTHER): Payer: Medicare Other | Admitting: Endocrinology

## 2020-01-13 VITALS — BP 138/62 | HR 112 | Ht 72.0 in | Wt 215.0 lb

## 2020-01-13 DIAGNOSIS — E11649 Type 2 diabetes mellitus with hypoglycemia without coma: Secondary | ICD-10-CM | POA: Diagnosis not present

## 2020-01-13 LAB — POCT GLYCOSYLATED HEMOGLOBIN (HGB A1C): Hemoglobin A1C: 7.1 % — AB (ref 4.0–5.6)

## 2020-01-13 MED ORDER — NOVOLIN N FLEXPEN 100 UNIT/ML ~~LOC~~ SUPN
PEN_INJECTOR | SUBCUTANEOUS | Status: DC
Start: 2020-01-13 — End: 2020-04-14

## 2020-01-13 NOTE — Progress Notes (Signed)
   Subjective:    Patient ID: Anthony Cuevas, male    DOB: 1950-08-23, 69 y.o.   MRN: [id number]  HPI Pt returns for f/u of diabetes mellitus:  DM type: Insulin-requiring type 2 Dx'ed: 2633 Complications: PAD Therapy: insulin since 2006, and metformin.   DKA: never Severe hypoglycemia: never.  Pancreatitis: never Pancreatic imaging: never SDOH: he takes human insulin, due to cost.   Other: he stopped pioglitizone, due to edema; he takes BID insulin, after poor results with multiple daily injections.  Interval history: continuous glucose monitor is reviewed.  glucose varies from 50-270.  There is no trend throughout the day. He has mild hypoglycemia 2-3 times per week.    Review of Systems Denies LOC    Objective:   Physical Exam GENERAL: no distress Pulses: dorsalis pedis intact bilat.   MSK: no deformity of the feet CV: trace bilat leg edema Skin:  no ulcer on the feet.  normal color and temp on the feet. Neuro: sensation is intact to touch on the feet, but decreased from normal.  Ext: there is bilateral onychomycosis of the toenails.      Lab Results  Component Value Date   HGBA1C 7.1 (A) 01/13/2020       Assessment & Plan:  Insulin-requiring type 2 DM, with PAD Hypoglycemia, due to insulin: this limits aggressiveness of glycemic control.   Patient Instructions  check your blood sugar 4 times a day.  vary the time of day when you check, between before the 3 meals, and at bedtime.  also check if you have symptoms of your blood sugar being too high or too low.  please keep a record of the readings and bring it to your next appointment here (or you can bring the meter itself).  You can write it on any piece of paper.  please call us sooner if your blood sugar goes below 70, or if you have a lot of readings over 200.   Please reduce insulin to 175 units each morning, and 25 units each evening   However, if you are going to be active that day, take just 140 units in the  morning Please come back for a follow-up appointment in 3 months.

## 2020-01-13 NOTE — Patient Instructions (Addendum)
check your blood sugar 4 times a day.  vary the time of day when you check, between before the 3 meals, and at bedtime.  also check if you have symptoms of your blood sugar being too high or too low.  please keep a record of the readings and bring it to your next appointment here (or you can bring the meter itself).  You can write it on any piece of paper.  please call us sooner if your blood sugar goes below 70, or if you have a lot of readings over 200.   Please reduce insulin to 175 units each morning, and 25 units each evening   However, if you are going to be active that day, take just 140 units in the morning Please come back for a follow-up appointment in 3 months.

## 2020-01-16 DIAGNOSIS — E119 Type 2 diabetes mellitus without complications: Secondary | ICD-10-CM | POA: Diagnosis not present

## 2020-01-16 DIAGNOSIS — Z794 Long term (current) use of insulin: Secondary | ICD-10-CM | POA: Diagnosis not present

## 2020-02-05 ENCOUNTER — Other Ambulatory Visit: Payer: Self-pay | Admitting: Family Medicine

## 2020-02-09 ENCOUNTER — Other Ambulatory Visit: Payer: Self-pay | Admitting: Cardiovascular Disease

## 2020-02-09 NOTE — Telephone Encounter (Signed)
Sent. Thanks.   

## 2020-02-15 DIAGNOSIS — Z794 Long term (current) use of insulin: Secondary | ICD-10-CM | POA: Diagnosis not present

## 2020-02-15 DIAGNOSIS — E119 Type 2 diabetes mellitus without complications: Secondary | ICD-10-CM | POA: Diagnosis not present

## 2020-02-25 ENCOUNTER — Ambulatory Visit: Payer: TRICARE For Life (TFL) | Admitting: Podiatry

## 2020-03-06 IMAGING — DX LEFT HAND - COMPLETE 3+ VIEW
3 series · 3 of 3 positions shown · non-contrast
Comparison: None.

CLINICAL DATA: Left hand pain. Foreign body present on third digit.

EXAM:
LEFT HAND - COMPLETE 3+ VIEW

[hand ap]
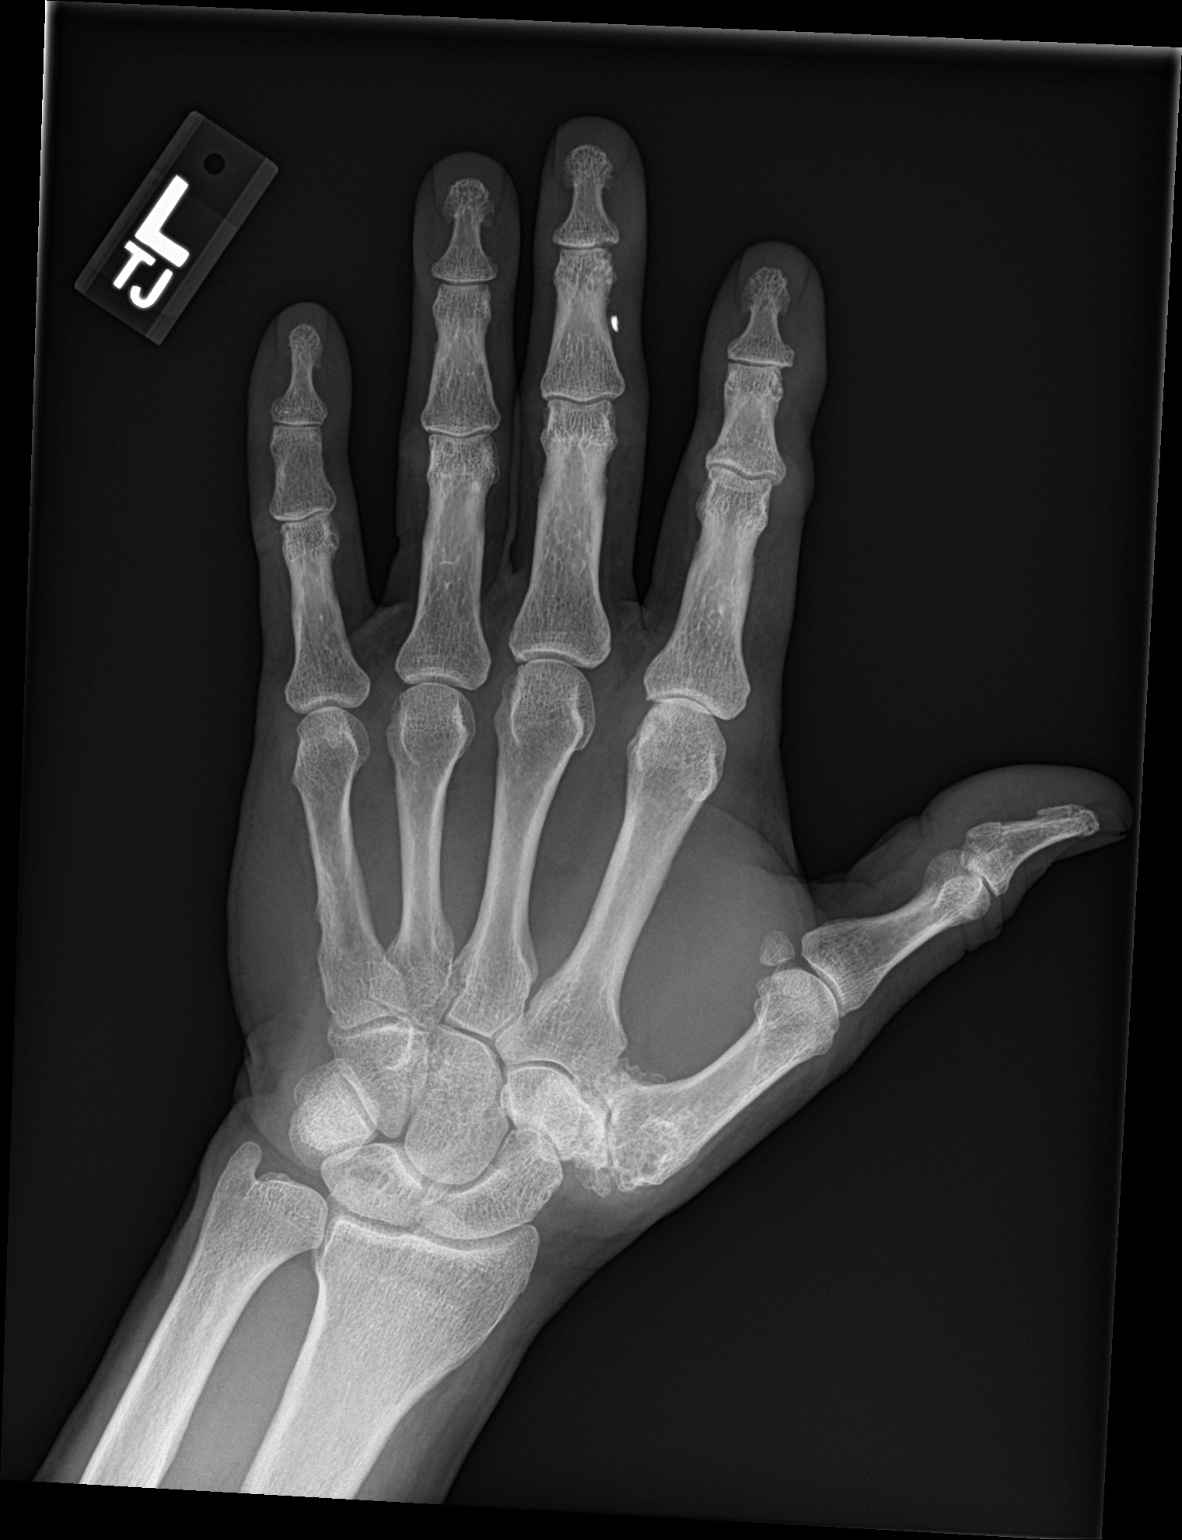

[hand obl]
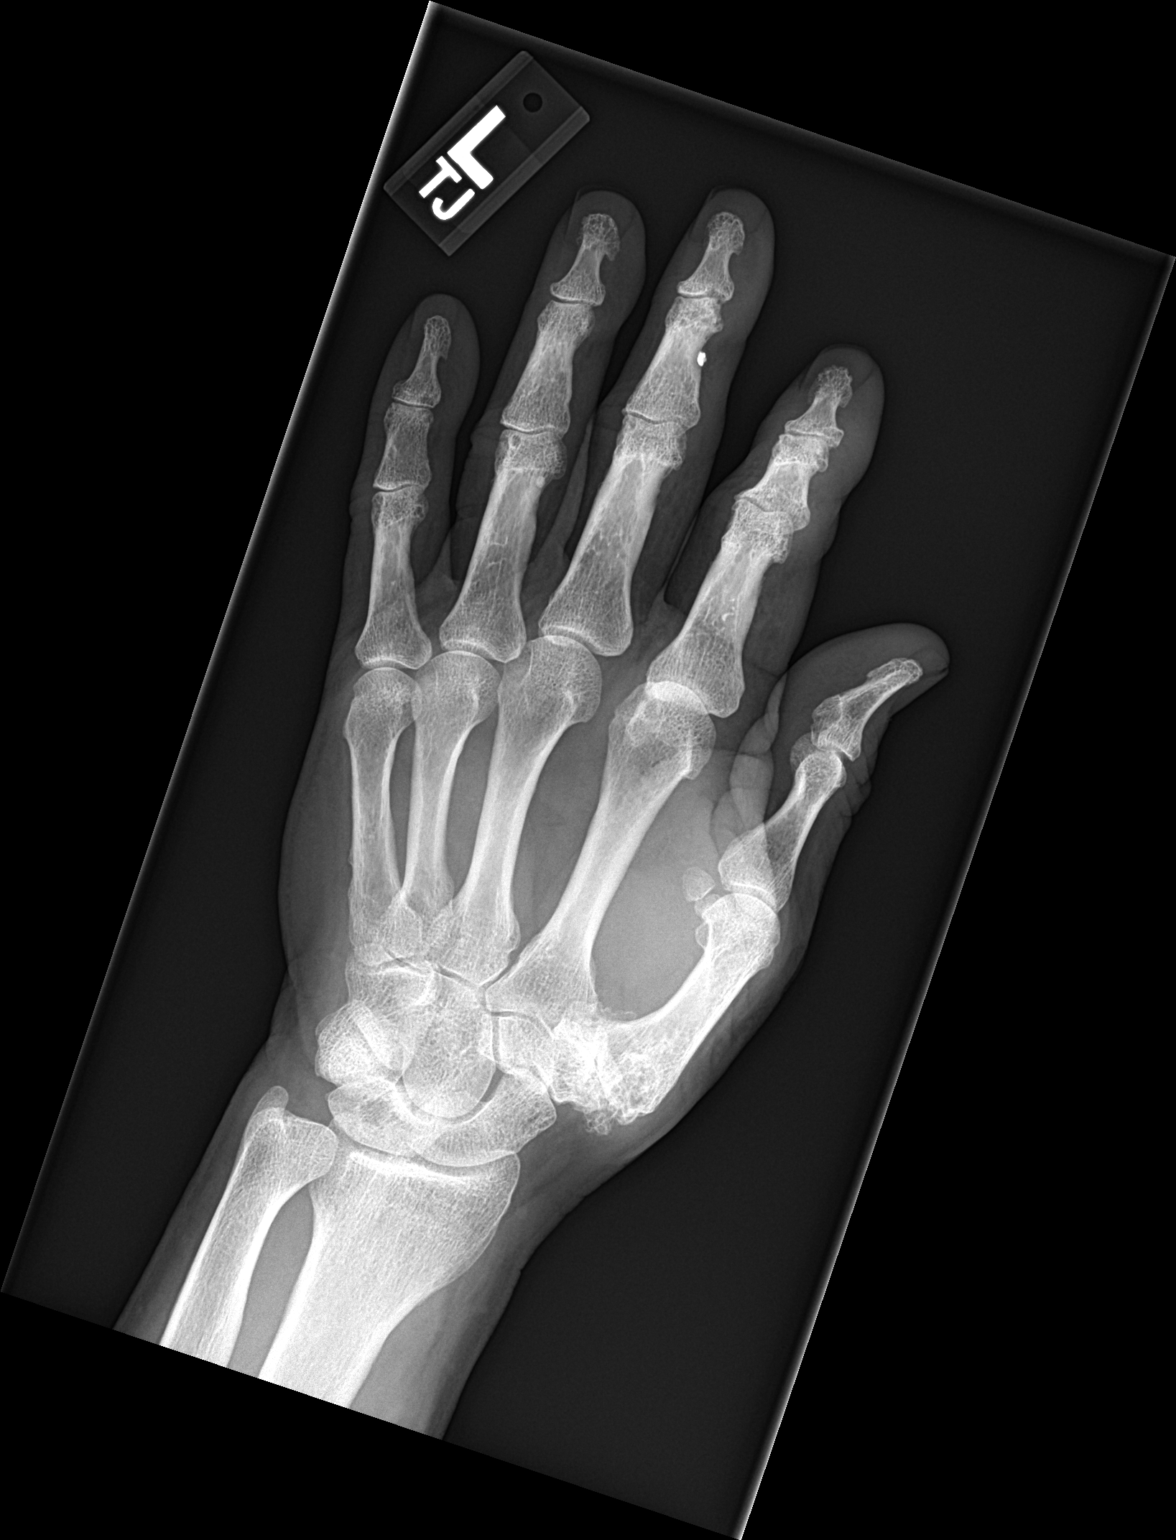

[hand lat]
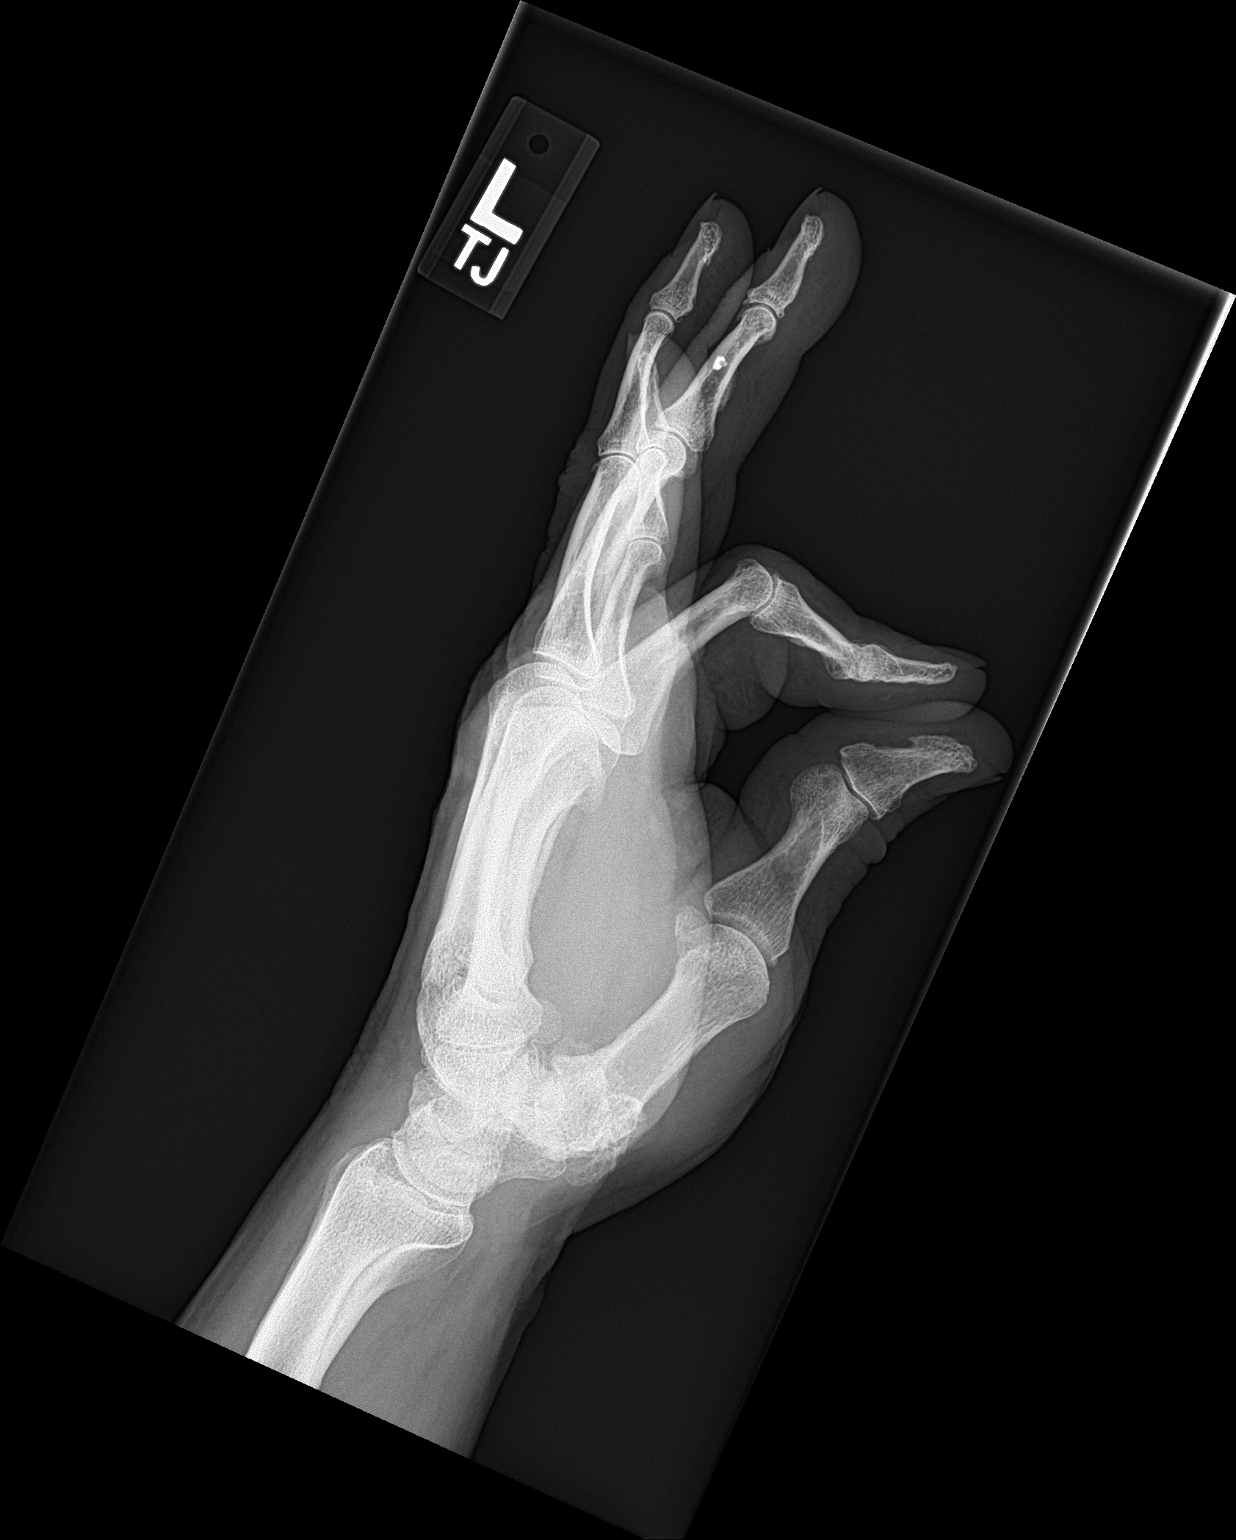

[3 of 3 positions shown; findings below may reference images not displayed]

FINDINGS: There is no acute displaced fracture or dislocation. A metallic
foreign body projects within the soft tissues adjacent to the middle
phalanx of the third digit. There are advanced degenerative changes
of the first carpometacarpal joint. Degenerative changes are noted
of the interphalangeal joints.
IMPRESSION: 1. No acute displaced fracture or dislocation.
2. Metallic foreign body within the soft tissues of the third digit
at the level of the middle phalanx.
3. Advanced degenerative changes of the first CMC joint.

## 2020-03-08 ENCOUNTER — Ambulatory Visit (INDEPENDENT_AMBULATORY_CARE_PROVIDER_SITE_OTHER): Payer: Medicare Other

## 2020-03-08 ENCOUNTER — Other Ambulatory Visit: Payer: Self-pay

## 2020-03-08 ENCOUNTER — Other Ambulatory Visit: Payer: Self-pay | Admitting: Cardiovascular Disease

## 2020-03-08 DIAGNOSIS — I739 Peripheral vascular disease, unspecified: Secondary | ICD-10-CM | POA: Diagnosis not present

## 2020-03-08 DIAGNOSIS — Z95828 Presence of other vascular implants and grafts: Secondary | ICD-10-CM

## 2020-03-11 ENCOUNTER — Telehealth: Payer: Self-pay

## 2020-03-11 NOTE — Telephone Encounter (Signed)
-----   Message from Wellington Hampshire, MD sent at 03/11/2020  1:12 PM EST ----- Normal ABI with patent left iliac stent.  There is moderate stenosis just distal to the stent that we have to monitor.  Repeat studies in 6 months.  Keep follow-up with me as planned.

## 2020-03-11 NOTE — Telephone Encounter (Signed)
Called to give the patient LE dopp results.lmtcb.

## 2020-03-11 NOTE — Telephone Encounter (Signed)
Patient made aware of PV test results with verbalized understanding. Orders previously placed for 6 mo repeat testing.

## 2020-03-16 DIAGNOSIS — E119 Type 2 diabetes mellitus without complications: Secondary | ICD-10-CM | POA: Diagnosis not present

## 2020-03-16 DIAGNOSIS — Z794 Long term (current) use of insulin: Secondary | ICD-10-CM | POA: Diagnosis not present

## 2020-03-22 ENCOUNTER — Telehealth: Payer: Self-pay

## 2020-03-22 NOTE — Telephone Encounter (Signed)
Refill request for Tramadol 50 mg [facility]   LOV - 12/11/19 Next OV- not scheduled Last refilled - 07/08/19 #270/1

## 2020-03-23 ENCOUNTER — Encounter: Payer: Self-pay | Admitting: Cardiovascular Disease

## 2020-03-23 ENCOUNTER — Other Ambulatory Visit: Payer: Self-pay

## 2020-03-23 ENCOUNTER — Ambulatory Visit (INDEPENDENT_AMBULATORY_CARE_PROVIDER_SITE_OTHER): Payer: Medicare Other | Admitting: Cardiovascular Disease

## 2020-03-23 VITALS — BP 154/62 | HR 102 | Ht 70.0 in | Wt 218.5 lb

## 2020-03-23 DIAGNOSIS — E785 Hyperlipidemia, unspecified: Secondary | ICD-10-CM

## 2020-03-23 DIAGNOSIS — I739 Peripheral vascular disease, unspecified: Secondary | ICD-10-CM

## 2020-03-23 DIAGNOSIS — I2510 Atherosclerotic heart disease of native coronary artery without angina pectoris: Secondary | ICD-10-CM

## 2020-03-23 DIAGNOSIS — I10 Essential (primary) hypertension: Secondary | ICD-10-CM | POA: Diagnosis not present

## 2020-03-23 MED ORDER — ROSUVASTATIN CALCIUM 20 MG PO TABS: 20.0000 mg | ORAL_TABLET | Freq: Every day | ORAL | 3 refills | Status: DC

## 2020-03-23 NOTE — Patient Instructions (Signed)
Medication Instructions:  Your physician has recommended you make the following change in your medication:   1) STOP Niacin 2) STOP Pravastatin 3) START Rosuvastatin (crestor) 20 mg daily. [facility]  *If you need a refill on your cardiac medications before your next appointment, please call your pharmacy*   Lab Work:  If you have labs (blood work) drawn today and your tests are completely normal, you will receive your results only by: Marland Kitchen MyChart Message (if you have MyChart) OR . A paper copy in the mail If you have any lab test that is abnormal or we need to change your treatment, we will call you to review the results.   Testing/Procedures: ABI and Vasc aorta/ivc/iliac in 6 months   Follow-Up: At [facility], you and your health needs are our priority.  As part of our continuing mission to provide you with exceptional heart care, we have created designated Provider Care Teams.  These Care Teams include your primary Cardiologist (physician) and Advanced Practice Providers (APPs -  Physician Assistants and Nurse Practitioners) who all work together to provide you with the care you need, when you need it.  We recommend signing up for the patient portal called "MyChart".  Sign up information is provided on this After Visit Summary.  MyChart is used to connect with patients for Virtual Visits (Telemedicine).  Patients are able to view lab/test results, encounter notes, upcoming appointments, etc.  Non-urgent messages can be sent to your provider as well.   To learn more about what you can do with MyChart, go to [email or web address].    Your next appointment:   6 month(s)  The format for your next appointment:   In Person  Provider:   You may see Kathlyn Sacramento, MD or one of the following Advanced Practice Providers on your designated Care Team:    Murray Hodgkins, NP  Christell Faith, PA-C  Marrianne Mood, PA-C  Cadence [place], [place]  Laurann Montana, NP    Other Instructions N/A

## 2020-03-23 NOTE — Progress Notes (Signed)
Cardiology Office Note   Date:  03/23/2020   ID:  Anthony Cuevas, Anthony Cuevas September 22, 1950, MRN [id number]  PCP:  Tonia Ghent, MD  Cardiologist:  Dr. Rockey Situ  Chief Complaint  Patient presents with  . 6 month follow up     Discuss ABI's. "doing well."  Medications reviewed by the patient verbally.       History of Present Illness: Anthony Cuevas is a 70 y.o. male who is here today for follow-up visit regarding peripheral arterial disease.He has known history of essential hypertension, hyperlipidemia, diabetes mellitus, remote tobacco use and coronary atherosclerosis noted on previous CT scan. He underwent stress testing in July 2020 which showed no evidence of ischemia or infarct. He was seen last year for severe left hip and leg claudication. He underwent noninvasive vascular studies which showed an ABI of 0.99 on the right and 0.54 in the left.  Duplex showed moderate right iliac disease .  On the left, the common iliac artery was occluded proximally into the mid segment with no evidence of infrainguinal disease with the exception of occluded anterior tibial artery. Angiography was performed in August of last year which showed moderate right common iliac artery stenosis, occluded and heavily calcified left common iliac artery in a long segment.  I performed successful angioplasty and covered stent placement to the left common iliac artery.  Postprocedure vascular studies showed improvement in ABI to normal with patent iliac stent and moderately elevated velocities just distal to the stent. No claudication since then.  He denies any chest pain or shortness of breath. Recent Doppler study showed stable findings with persistently elevated velocity distal to the stent.  Spite of that, ABI remains normal and he has no claudication.  Past Medical History:  Diagnosis Date  . Arthritis   . Coronary atherosclerosis    a. 07/2018 CT Chest: 3 vessel coronary atherosclerosis; b. 07/2018 MV: EF  46% (GI uptake artifact), No ischemia/infarct. Mild to mod diffuse Ao atherosclerosis and at least moderate 2 vessel CAD (LAD/RCA) on CT imaging. Low risk.  . Depression    improved on sertraline  . Diabetes mellitus, type 2 ([place]) 09/1996  . GERD (gastroesophageal reflux disease) 1990  . History of peptic ulcer disease   . Hyperlipidemia 1992  . Hypertension 05/2003  . Lumbar stenosis L2-3  . Muscle atrophy    in arms from degenerative changes in neck  . Neck pain    chronic  . Smoker   . Wears glasses     Past Surgical History:  Procedure Laterality Date  . ABDOMINAL AORTOGRAM W/LOWER EXTREMITY N/A 09/03/2019   Procedure: ABDOMINAL AORTOGRAM W/LOWER EXTREMITY;  Surgeon: Wellington Hampshire, MD;  Location: [facility];  Service: Cardiovascular;  Laterality: N/A;  Limited Study  . CERVICAL DISC SURGERY  04/19/02   fusion, Dr. Lorin Mercy  . CERVICAL DISCECTOMY  1997   partial  . CERVICAL DISCECTOMY  2000  . COLONOSCOPY WITH ESOPHAGOGASTRODUODENOSCOPY (EGD)  06/25/2015  . [place]  . KNEE ARTHROPLASTY  11/20/2011   Procedure: COMPUTER ASSISTED TOTAL KNEE ARTHROPLASTY;  Surgeon: Marybelle Killings, MD;  Location: [facility];  Service: Orthopedics;  Laterality: Left;  Conversion Left Knee Medial Uni to Total Knee Arthroplasty-Cemented  . LUMBAR LAMINECTOMY/DECOMPRESSION MICRODISCECTOMY N/A 04/28/2015   Procedure: L2-3 Decompression;  Surgeon: Marybelle Killings, MD;  Location: [facility];  Service: Orthopedics;  Laterality: N/A;  . MR MRA DUPLICATE EXAM  INACTIVATE    . MULTIPLE TOOTH  EXTRACTIONS    . Neisen funduplication  8366  . PERIPHERAL VASCULAR INTERVENTION  09/03/2019   Procedure: PERIPHERAL VASCULAR INTERVENTION;  Surgeon: Wellington Hampshire, MD;  Location: [facility];  Service: Cardiovascular;;  Iliac Stent  . REPLACEMENT TOTAL KNEE  02/14/02, 05/04/02   left- partial  . SCC EXCISION  04/25/01   Dr. March Rummage  . STOMACH SURGERY  1984   PUD, [facility]     Current Outpatient Medications   Medication Sig Dispense Refill  . aspirin EC 81 MG tablet Take 1 tablet (81 mg total) by mouth daily. Swallow whole. 90 tablet 3  . Cholecalciferol (VITAMIN D3) 125 MCG (5000 UT) CAPS Take 5,000 Units by mouth 2 (two) times a week.    . clopidogrel (PLAVIX) 75 MG tablet TAKE 1 TABLET DAILY 90 tablet 0  . Continuous Blood Gluc Sensor (FREESTYLE LIBRE 14 DAY SENSOR) MISC 1 Device by Does not apply route every 14 (fourteen) days. 6 each 3  . cyanocobalamin 1000 MCG tablet Take 1,000 mcg by mouth daily.    . cyclobenzaprine (FLEXERIL) 10 MG tablet TAKE 1 TABLET TWICE A DAY AS NEEDED FOR MUSCLE SPASMS (SEDATION CAUTION) 180 tablet 1  . Insulin NPH, Human,, Isophane, (NOVOLIN N FLEXPEN) 100 UNIT/ML Kiwkpen 175 units each morning, and 25 units each evening, and pen needles 3/day. 15 mL   . latanoprost (XALATAN) 0.005 % ophthalmic solution 1 drop at bedtime.    Marland Kitchen lisinopril (ZESTRIL) 40 MG tablet Take 1 tablet (40 mg total) by mouth daily. 90 tablet 3  . metFORMIN (GLUCOPHAGE) 1000 MG tablet TAKE 1 TABLET TWICE A DAY WITH MEALS 180 tablet 3  . niacin (NIASPAN) 500 MG CR tablet TAKE 1 TABLET AT BEDTIME 90 tablet 1  . Omega-3 Fatty Acids (FISH OIL PO) Take 1,000 mg by mouth once a week.    Glory Rosebush ULTRA test strip USE AS INSTRUCTED TO TEST BLOOD SUGARS 3 OR 4 TIMES DAILY 100 each 11  . pravastatin (PRAVACHOL) 80 MG tablet Take 1 tablet (80 mg total) by mouth daily. 90 tablet 3  . SURE COMFORT PEN NEEDLES 32G X 4 MM MISC USE THREE TIMES A DAY 100 each 10  . traMADol (ULTRAM) 50 MG tablet TAKE 1 TABLET EVERY 8 HOURS AS NEEDED FOR CHRONIC JOINT/BACK PAIN (SEDATION CAUTION) 270 tablet 1   No current facility-administered medications for this visit.    Allergies:   Patient has no known allergies.    Social History:  The patient  reports that he quit smoking about 8 years ago. His smoking use included cigarettes. He has a 41.00 pack-year smoking history. He has never used smokeless tobacco. He reports  that he does not drink alcohol and does not use drugs.   Family History:  The patient's family history includes Breast cancer in his maternal grandmother; Diabetes in his brother, brother, father, mother, and sister; Heart disease in his brother; Hypertension in his mother; Lung disease in his paternal grandfather; Stroke in his mother.    ROS:  Please see the history of present illness.   Otherwise, review of systems are positive for none.   All other systems are reviewed and negative.    PHYSICAL EXAM: VS:  BP (!) 154/62 (BP Location: Left Arm, Patient Position: Sitting, Cuff Size: Normal)   Pulse (!) 102   Ht 5\' 10"  (1.778 m)   Wt 218 lb 8 oz (99.1 kg)   BMI 31.35 kg/m  , BMI Body mass index is 31.35  kg/m. GEN: Well nourished, well developed, in no acute distress  HEENT: normal  Neck: no JVD, carotid bruits, or masses Cardiac: RRR; no murmurs, rubs, or gallops,no edema  Respiratory:  clear to auscultation bilaterally, normal work of breathing GI: soft, nontender, nondistended, + BS MS: no deformity or atrophy  Skin: warm and dry, no rash Neuro:  Strength and sensation are intact Psych: euthymic mood, full affect Vascular: Radial pulses normal bilaterally.  Femoral pulses: Slightly diminished bilaterally.  Distal pulses are palpable.      EKG:  EKG is  ordered today. EKG showed sinus tachycardia with no significant ST or T wave changes.  Heart rate is 102 bpm.   Recent Labs: 12/05/2019: ALT 31; BUN 18; Creatinine, Ser 1.20; Hemoglobin 13.9; Platelets 212.0; Potassium 5.0; Sodium 141; TSH 9.43    Lipid Panel    Component Value Date/Time   CHOL 135 12/05/2019 0753   TRIG 204.0 (H) 12/05/2019 0753   HDL 34.10 (L) 12/05/2019 0753   CHOLHDL 4 12/05/2019 0753   VLDL 40.8 (H) 12/05/2019 0753   LDLCALC 68 07/13/2014 0814   LDLDIRECT 78.0 12/05/2019 0753      Wt Readings from Last 3 Encounters:  03/23/20 218 lb 8 oz (99.1 kg)  01/13/20 215 lb (97.5 kg)  01/12/20 218 lb  (98.9 kg)      No flowsheet data found.    ASSESSMENT AND PLAN:  1.  Peripheral arterial disease: Status post  left common iliac artery covered stent placement due to an occlusion associated with severe left leg claudication.  ABI improved to normal and claudication resolved.  He continues to have borderline significant velocities involving the right common iliac artery as well as distal to the left common iliac artery stent.  Currently, his femoral pulses are only slightly diminished and ABI remains normal with no claudication.  We will continue to monitor this for now and repeat Doppler studies in 6 months. I will keep him on dual lipid therapy for now.  2.  Coronary atherosclerosis: Negative stress testing last year.  Currently with no anginal symptoms.  3.  Essential hypertension:  Blood pressure is elevated today but usually is not this high.  Continue to monitor for now and if needed we could add carvedilol given sinus tachycardia or amlodipine.  4.  Hyperlipidemia: He is currently on small dose niacin and pravastatin.  Studies showed no clinical benefit with niacin and its better to use a more potent statin to get his LDL very low.  Thus, I elected to discontinue niacin and pravastatin and switch him to rosuvastatin 20 mg daily.  Check lipid and liver profile in 2 months.    Disposition:   FU with me in 6 months  Signed,  Kathlyn Sacramento, MD  03/23/2020 2:08 PM    [facility]

## 2020-03-24 MED ORDER — TRAMADOL HCL 50 MG PO TABS: ORAL_TABLET | ORAL | 1 refills | Status: DC

## 2020-03-24 NOTE — Addendum Note (Signed)
Addended by: Tonia Ghent on: 03/24/2020 09:26 PM   Modules accepted: Orders

## 2020-03-24 NOTE — Telephone Encounter (Signed)
Sent. Thanks.   

## 2020-03-30 ENCOUNTER — Other Ambulatory Visit: Payer: Self-pay | Admitting: Endocrinology

## 2020-04-09 DIAGNOSIS — H401131 Primary open-angle glaucoma, bilateral, mild stage: Secondary | ICD-10-CM | POA: Diagnosis not present

## 2020-04-14 ENCOUNTER — Ambulatory Visit (INDEPENDENT_AMBULATORY_CARE_PROVIDER_SITE_OTHER): Payer: Medicare Other | Admitting: Endocrinology

## 2020-04-14 ENCOUNTER — Other Ambulatory Visit: Payer: Self-pay

## 2020-04-14 VITALS — BP 138/70 | HR 102 | Ht 70.0 in | Wt 218.6 lb

## 2020-04-14 DIAGNOSIS — E11649 Type 2 diabetes mellitus with hypoglycemia without coma: Secondary | ICD-10-CM | POA: Diagnosis not present

## 2020-04-14 LAB — POCT GLYCOSYLATED HEMOGLOBIN (HGB A1C): Hemoglobin A1C: 7.9 % — AB (ref 4.0–5.6)

## 2020-04-14 MED ORDER — NOVOLIN N FLEXPEN 100 UNIT/ML ~~LOC~~ SUPN: PEN_INJECTOR | SUBCUTANEOUS | 3 refills | Status: DC

## 2020-04-14 NOTE — Progress Notes (Signed)
Subjective:    Patient ID: Anthony Cuevas, male    DOB: [birth year], 70 y.o.   MRN: [id number]  HPI Pt returns for f/u of diabetes mellitus:  DM type: Insulin-requiring type 2 Dx'ed: 8242 Complications: PAD Therapy: insulin since 2006, and metformin.   DKA: never Severe hypoglycemia: never.  Pancreatitis: never Pancreatic imaging: never SDOH: he takes human insulin, due to cost.   Other: he stopped pioglitizone, due to edema; he takes BID insulin, after poor results with multiple daily injections.  Interval history: continuous glucose monitor is reviewed.  glucose varies from 50-290.  It is in general higher and more variable as the day goes on, but there is little trend throughout the day. He has mild hypoglycemia approx QOD. Pt says he never misses the insulin.   Past Medical History:  Diagnosis Date  . Arthritis   . Coronary atherosclerosis    a. 07/2018 CT Chest: 3 vessel coronary atherosclerosis; b. 07/2018 MV: EF 46% (GI uptake artifact), No ischemia/infarct. Mild to mod diffuse Ao atherosclerosis and at least moderate 2 vessel CAD (LAD/RCA) on CT imaging. Low risk.  . Depression    improved on sertraline  . Diabetes mellitus, type 2 ([place]) 09/1996  . GERD (gastroesophageal reflux disease) 1990  . History of peptic ulcer disease   . Hyperlipidemia 1992  . Hypertension 05/2003  . Lumbar stenosis L2-3  . Muscle atrophy    in arms from degenerative changes in neck  . Neck pain    chronic  . Smoker   . Wears glasses     Past Surgical History:  Procedure Laterality Date  . ABDOMINAL AORTOGRAM W/LOWER EXTREMITY N/A 09/03/2019   Procedure: ABDOMINAL AORTOGRAM W/LOWER EXTREMITY;  Surgeon: Wellington Hampshire, MD;  Location: [facility];  Service: Cardiovascular;  Laterality: N/A;  Limited Study  . CERVICAL DISC SURGERY  04/19/02   fusion, Dr. Lorin Mercy  . CERVICAL DISCECTOMY  1997   partial  . CERVICAL DISCECTOMY  2000  . COLONOSCOPY WITH ESOPHAGOGASTRODUODENOSCOPY (EGD)   06/25/2015  . [place]  . KNEE ARTHROPLASTY  11/20/2011   Procedure: COMPUTER ASSISTED TOTAL KNEE ARTHROPLASTY;  Surgeon: Marybelle Killings, MD;  Location: [facility];  Service: Orthopedics;  Laterality: Left;  Conversion Left Knee Medial Uni to Total Knee Arthroplasty-Cemented  . LUMBAR LAMINECTOMY/DECOMPRESSION MICRODISCECTOMY N/A 04/28/2015   Procedure: L2-3 Decompression;  Surgeon: Marybelle Killings, MD;  Location: [facility];  Service: Orthopedics;  Laterality: N/A;  . MR MRA DUPLICATE EXAM  INACTIVATE    . MULTIPLE TOOTH EXTRACTIONS    . Neisen funduplication  [id number]  . PERIPHERAL VASCULAR INTERVENTION  09/03/2019   Procedure: PERIPHERAL VASCULAR INTERVENTION;  Surgeon: Wellington Hampshire, MD;  Location: [facility];  Service: Cardiovascular;;  Iliac Stent  . REPLACEMENT TOTAL KNEE  02/14/02, 05/04/02   left- partial  . SCC EXCISION  04/25/01   Dr. March Rummage  . STOMACH SURGERY  1984   PUD, [place]    Social History   Socioeconomic History  . Marital status: Married    Spouse name: Not on file  . Number of children: 3  . Years of education: Not on file  . Highest education level: Not on file  Occupational History  . Occupation: Training and development officer, retired    Comment: Scientific laboratory technician  Tobacco Use  . Smoking status: Former Smoker    Packs/day: 1.00    Years: 41.00    Pack years: 41.00    Types: Cigarettes  Quit date: 2014    Years since quitting: 8.2  . Smokeless tobacco: Never Used  Vaping Use  . Vaping Use: Never used  Substance and Sexual Activity  . Alcohol use: No    Alcohol/week: 0.0 standard drinks  . Drug use: No  . Sexual activity: Never  Other Topics Concern  . 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 Difficulty of Paying Living Expenses: Not  hard at all  Food Insecurity: No Food Insecurity  . Worried About Charity fundraiser in the Last Year: Never true  . Ran Out of Food in the Last Year: Never true  Transportation Needs: No Transportation Needs  . Lack of Transportation (Medical): No  . Lack of Transportation (Non-Medical): No  Physical Activity: Insufficiently Active  . Days of Exercise per Week: 2 days  . Minutes of Exercise per Session: 30 min  Stress: No Stress Concern Present  . Feeling of Stress : Not at all  Social Connections: Not on file  Intimate Partner Violence: Not At Risk  . Fear of Current or Ex-Partner: No  . Emotionally Abused: No  . Physically Abused: No  . Sexually Abused: No    Current Outpatient Medications on File Prior to Visit  Medication Sig Dispense Refill  . aspirin EC 81 MG tablet Take 1 tablet (81 mg total) by mouth daily. Swallow whole. 90 tablet 3  . Cholecalciferol (VITAMIN D3) 125 MCG (5000 UT) CAPS Take 5,000 Units by mouth 2 (two) times a week.    . clopidogrel (PLAVIX) 75 MG tablet TAKE 1 TABLET DAILY 90 tablet 0  . Continuous Blood Gluc Sensor (FREESTYLE LIBRE 14 DAY SENSOR) MISC 1 Device by Does not apply route every 14 (fourteen) days. 6 each 3  . cyanocobalamin 1000 MCG tablet Take 1,000 mcg by mouth daily.    . cyclobenzaprine (FLEXERIL) 10 MG tablet TAKE 1 TABLET TWICE A DAY AS NEEDED FOR MUSCLE SPASMS (SEDATION CAUTION) 180 tablet 1  . latanoprost (XALATAN) 0.005 % ophthalmic solution 1 drop at bedtime.    Marland Kitchen lisinopril (ZESTRIL) 40 MG tablet Take 1 tablet (40 mg total) by mouth daily. 90 tablet 3  . metFORMIN (GLUCOPHAGE) 1000 MG tablet TAKE 1 TABLET TWICE A DAY WITH MEALS 180 tablet 3  . Omega-3 Fatty Acids (FISH OIL PO) Take 1,000 mg by mouth once a week.    Glory Rosebush ULTRA test strip USE AS INSTRUCTED TO TEST BLOOD SUGARS 3 OR 4 TIMES DAILY 100 each 11  . rosuvastatin (CRESTOR) 20 MG tablet Take 1 tablet (20 mg total) by mouth daily. 90 tablet 3  . SURE COMFORT PEN  NEEDLES 32G X 4 MM MISC USE THREE TIMES A DAY 100 each 10  . traMADol (ULTRAM) 50 MG tablet TAKE 1 TABLET EVERY 8 HOURS AS NEEDED FOR CHRONIC JOINT/BACK PAIN (SEDATION CAUTION) 270 tablet 1   No current facility-administered medications on file prior to visit.    No Known Allergies  Family History  Problem Relation Age of Onset  . Diabetes Mother   . Hypertension Mother   . Stroke Mother   . Diabetes Sister   . Diabetes Brother   . Diabetes Brother   . Heart disease Brother   .  Diabetes Father   . Breast cancer Maternal Grandmother        breast  . Lung disease Paternal Grandfather        black lung  . Arthritis Neg Hx   . Prostate cancer Neg Hx   . Colon cancer Neg Hx   . Stomach cancer Neg Hx     BP 138/70 (BP Location: Right Arm, Patient Position: Sitting, Cuff Size: Normal)   Pulse (!) 102   Ht 5\' 10"  (1.778 m)   Wt 218 lb 9.6 oz (99.2 kg)   SpO2 98%   BMI 31.37 kg/m    Review of Systems     Objective:   Physical Exam GENERAL: no distress Pulses: dorsalis pedis intact bilat.   MSK: no deformity of the feet CV: trace bilat leg edema Skin:  no ulcer on the feet.  normal color and temp on the feet. Neuro: sensation is intact to touch on the feet, but decreased from normal.  Ext: there is bilateral onychomycosis of the toenails.    Lab Results  Component Value Date   HGBA1C 7.9 (A) 04/14/2020   Lab Results  Component Value Date   CREATININE 1.20 12/05/2019   BUN 18 12/05/2019   NA 141 12/05/2019   K 5.0 12/05/2019   CL 102 12/05/2019   CO2 28 12/05/2019       Assessment & Plan:  Insulin-requiring type 2 DM, with PAD Hypoglycemia, due to insulin: this limits aggressiveness of glycemic control   Patient Instructions  check your blood sugar 4 times a day.  vary the time of day when you check, between before the 3 meals, and at bedtime.  also check if you have symptoms of your blood sugar being too high or too low.  please keep a record of the  readings and bring it to your next appointment here (or you can bring the meter itself).  You can write it on any piece of paper.  please call us sooner if your blood sugar goes below 70, or if you have a lot of readings over 200.   Please reduce insulin to 175 units each morning, and 20 units each evening   However, if you are going to be active that day, take just 140 units in the morning.   Please come back for a follow-up appointment in 3 months.

## 2020-04-14 NOTE — Patient Instructions (Addendum)
check your blood sugar 4 times a day.  vary the time of day when you check, between before the 3 meals, and at bedtime.  also check if you have symptoms of your blood sugar being too high or too low.  please keep a record of the readings and bring it to your next appointment here (or you can bring the meter itself).  You can write it on any piece of paper.  please call us sooner if your blood sugar goes below 70, or if you have a lot of readings over 200.   Please reduce insulin to 175 units each morning, and 20 units each evening   However, if you are going to be active that day, take just 140 units in the morning.   Please come back for a follow-up appointment in 3 months.

## 2020-04-15 DIAGNOSIS — E119 Type 2 diabetes mellitus without complications: Secondary | ICD-10-CM | POA: Diagnosis not present

## 2020-04-15 DIAGNOSIS — Z794 Long term (current) use of insulin: Secondary | ICD-10-CM | POA: Diagnosis not present

## 2020-05-10 ENCOUNTER — Other Ambulatory Visit: Payer: Self-pay | Admitting: Cardiovascular Disease

## 2020-05-15 DIAGNOSIS — Z794 Long term (current) use of insulin: Secondary | ICD-10-CM | POA: Diagnosis not present

## 2020-05-15 DIAGNOSIS — E119 Type 2 diabetes mellitus without complications: Secondary | ICD-10-CM | POA: Diagnosis not present

## 2020-06-14 DIAGNOSIS — Z794 Long term (current) use of insulin: Secondary | ICD-10-CM | POA: Diagnosis not present

## 2020-06-14 DIAGNOSIS — E119 Type 2 diabetes mellitus without complications: Secondary | ICD-10-CM | POA: Diagnosis not present

## 2020-06-15 ENCOUNTER — Other Ambulatory Visit: Payer: Self-pay | Admitting: *Deleted

## 2020-06-15 MED ORDER — LISINOPRIL 40 MG PO TABS: 40.0000 mg | ORAL_TABLET | Freq: Every day | ORAL | 0 refills | Status: DC

## 2020-07-03 ENCOUNTER — Other Ambulatory Visit: Payer: Self-pay | Admitting: Family Medicine

## 2020-07-14 DIAGNOSIS — Z794 Long term (current) use of insulin: Secondary | ICD-10-CM | POA: Diagnosis not present

## 2020-07-14 DIAGNOSIS — E119 Type 2 diabetes mellitus without complications: Secondary | ICD-10-CM | POA: Diagnosis not present

## 2020-07-19 ENCOUNTER — Ambulatory Visit (INDEPENDENT_AMBULATORY_CARE_PROVIDER_SITE_OTHER): Payer: Medicare Other | Admitting: Endocrinology

## 2020-07-19 ENCOUNTER — Other Ambulatory Visit: Payer: Self-pay

## 2020-07-19 VITALS — Ht 70.0 in | Wt 215.0 lb

## 2020-07-19 DIAGNOSIS — E11649 Type 2 diabetes mellitus with hypoglycemia without coma: Secondary | ICD-10-CM

## 2020-07-19 LAB — POCT GLYCOSYLATED HEMOGLOBIN (HGB A1C): Hemoglobin A1C: 7.8 % — AB (ref 4.0–5.6)

## 2020-07-19 MED ORDER — NOVOLIN N FLEXPEN 100 UNIT/ML ~~LOC~~ SUPN: PEN_INJECTOR | SUBCUTANEOUS | 3 refills | Status: DC

## 2020-07-19 NOTE — Patient Instructions (Addendum)
check your blood sugar 4 times a day.  vary the time of day when you check, between before the 3 meals, and at bedtime.  also check if you have symptoms of your blood sugar being too high or too low.  please keep a record of the readings and bring it to your next appointment here (or you can bring the meter itself).  You can write it on any piece of paper.  please call us sooner if your blood sugar goes below 70, or if you have a lot of readings over 200.   Please reduce the insulin to 170 units each morning, and 20 units each evening   However, if you are going to be active that day, take just 140 units in the morning.   Please come back for a follow-up appointment in 3 months.

## 2020-07-19 NOTE — Progress Notes (Signed)
Subjective:    Patient ID: Anthony Cuevas, male    DOB: May 04, 1950, 70 y.o.   MRN: [id number]  HPI Pt returns for f/u of diabetes mellitus:  DM type: Insulin-requiring type 2 Dx'ed: 0626 Complications: PAD.   Therapy: insulin since 2006, and metformin.   DKA: never Severe hypoglycemia: never.  Pancreatitis: never Pancreatic imaging: never SDOH: he takes human insulin, due to cost.   Other: he stopped pioglitizone, due to edema; he takes BID insulin, after poor results with multiple daily injections.   Interval history: continuous glucose monitor data are reviewed.  glucose varies from 50-280.  It is in general highest 8PM-1AM, but there is little trend throughout the day.  It is in general lowest 3AM-7AM, and 2PM-4PM.  He has mild hypoglycemia approx QOD. Pt says he never misses the insulin.   Past Medical History:  Diagnosis Date   Arthritis    Coronary atherosclerosis    a. 07/2018 CT Chest: 3 vessel coronary atherosclerosis; b. 07/2018 MV: EF 46% (GI uptake artifact), No ischemia/infarct. Mild to mod diffuse Ao atherosclerosis and at least moderate 2 vessel CAD (LAD/RCA) on CT imaging. Low risk.   Depression    improved on sertraline   Diabetes mellitus, type 2 ([place]) 09/1996   GERD (gastroesophageal reflux disease) 1990   History of peptic ulcer disease    Hyperlipidemia 1992   Hypertension 05/2003   Lumbar stenosis L2-3   Muscle atrophy    in arms from degenerative changes in neck   Neck pain    chronic   Smoker    Wears glasses     Past Surgical History:  Procedure Laterality Date   ABDOMINAL AORTOGRAM W/LOWER EXTREMITY N/A 09/03/2019   Procedure: ABDOMINAL AORTOGRAM W/LOWER EXTREMITY;  Surgeon: Wellington Hampshire, MD;  Location: [facility];  Service: Cardiovascular;  Laterality: N/A;  Limited Study   CERVICAL DISC SURGERY  04/19/02   fusion, Dr. Lorin Mercy   CERVICAL DISCECTOMY  1997   partial   CERVICAL DISCECTOMY  2000   COLONOSCOPY WITH  ESOPHAGOGASTRODUODENOSCOPY (EGD)  06/25/2015   HEMORRHOID SURGERY  1989   KNEE ARTHROPLASTY  11/20/2011   Procedure: COMPUTER ASSISTED TOTAL KNEE ARTHROPLASTY;  Surgeon: Marybelle Killings, MD;  Location: [facility];  Service: Orthopedics;  Laterality: Left;  Conversion Left Knee Medial Uni to Total Knee Arthroplasty-Cemented   LUMBAR LAMINECTOMY/DECOMPRESSION MICRODISCECTOMY N/A 04/28/2015   Procedure: L2-3 Decompression;  Surgeon: Marybelle Killings, MD;  Location: [facility];  Service: Orthopedics;  Laterality: N/A;   MR MRA DUPLICATE EXAM  INACTIVATE     MULTIPLE TOOTH EXTRACTIONS     Neisen funduplication  [id number]   PERIPHERAL VASCULAR INTERVENTION  09/03/2019   Procedure: PERIPHERAL VASCULAR INTERVENTION;  Surgeon: Wellington Hampshire, MD;  Location: [facility];  Service: Cardiovascular;;  Iliac Stent   REPLACEMENT TOTAL KNEE  02/14/02, 05/04/02   left- partial   SCC EXCISION  04/25/01   Dr. March Rummage   STOMACH SURGERY  1984   PUD, [facility]    Social History   Socioeconomic History   Marital status: Married    Spouse name: Not on file   Number of children: 3   Years of education: Not on file   Highest education level: Not on file  Occupational History   Occupation: Training and development officer, retired    Comment: Scientific laboratory technician  Tobacco Use   Smoking status: Former    Packs/day: 1.00    Years: 41.00    Pack years:  41.00    Types: Cigarettes    Quit date: 2014    Years since quitting: 8.4   Smokeless tobacco: Never  Vaping Use   Vaping Use: Never used  Substance and Sexual Activity   Alcohol use: No    Alcohol/week: 0.0 standard drinks   Drug use: No   Sexual activity: Never  Other Topics Concern   Not on file  Social History Narrative   Retired Education officer, museum, [employer], aviation fuel, [phone], no known agent orange exposure but 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   Married 1975   3 kids   Social Determinants of Health   Financial Resource Strain: Low Risk    Difficulty of Paying Living Expenses: Not  hard at all  Food Insecurity: No Food Insecurity   Worried About Charity fundraiser in the Last Year: Never true   Arboriculturist in the Last Year: Never true  Transportation Needs: No Transportation Needs   Lack of Transportation (Medical): No   Lack of Transportation (Non-Medical): No  Physical Activity: Insufficiently Active   Days of Exercise per Week: 2 days   Minutes of Exercise per Session: 30 min  Stress: No Stress Concern Present   Feeling of Stress : Not at all  Social Connections: Not on file  Intimate Partner Violence: Not At Risk   Fear of Current or Ex-Partner: No   Emotionally Abused: No   Physically Abused: No   Sexually Abused: No    Current Outpatient Medications on File Prior to Visit  Medication Sig Dispense Refill   aspirin EC 81 MG tablet Take 1 tablet (81 mg total) by mouth daily. Swallow whole. 90 tablet 3   Cholecalciferol (VITAMIN D3) 125 MCG (5000 UT) CAPS Take 5,000 Units by mouth 2 (two) times a week.     clopidogrel (PLAVIX) 75 MG tablet TAKE 1 TABLET DAILY 90 tablet 0   Continuous Blood Gluc Sensor (FREESTYLE LIBRE 14 DAY SENSOR) MISC 1 Device by Does not apply route every 14 (fourteen) days. 6 each 3   cyanocobalamin 1000 MCG tablet Take 1,000 mcg by mouth daily.     cyclobenzaprine (FLEXERIL) 10 MG tablet TAKE 1 TABLET TWICE A DAY AS NEEDED FOR MUSCLE SPASMS (SEDATION CAUTION) 180 tablet 1   latanoprost (XALATAN) 0.005 % ophthalmic solution 1 drop at bedtime.     lisinopril (ZESTRIL) 40 MG tablet Take 1 tablet (40 mg total) by mouth daily. 90 tablet 0   metFORMIN (GLUCOPHAGE) 1000 MG tablet TAKE 1 TABLET TWICE A DAY WITH MEALS 180 tablet 3   Omega-3 Fatty Acids (FISH OIL PO) Take 1,000 mg by mouth once a week.     ONETOUCH ULTRA test strip USE AS INSTRUCTED TO TEST BLOOD SUGARS 3 OR 4 TIMES DAILY 100 each 11   SURE COMFORT PEN NEEDLES 32G X 4 MM MISC USE THREE TIMES A DAY 100 each 10   traMADol (ULTRAM) 50 MG tablet TAKE 1 TABLET EVERY 8 HOURS AS  NEEDED FOR CHRONIC JOINT/BACK PAIN (SEDATION CAUTION) 270 tablet 1   rosuvastatin (CRESTOR) 20 MG tablet Take 1 tablet (20 mg total) by mouth daily. 90 tablet 3   No current facility-administered medications on file prior to visit.    No Known Allergies  Family History  Problem Relation Age of Onset   Diabetes Mother    Hypertension Mother    Stroke Mother    Diabetes Sister    Diabetes Brother    Diabetes Brother  Heart disease Brother    Diabetes Father    Breast cancer Maternal Grandmother        breast   Lung disease Paternal Grandfather        black lung   Arthritis Neg Hx    Prostate cancer Neg Hx    Colon cancer Neg Hx    Stomach cancer Neg Hx     Ht 5\' 10"  (1.778 m)   Wt 215 lb (97.5 kg)   BMI 30.85 kg/m    Review of Systems     Objective:   Physical Exam GENERAL: no distress Pulses: dorsalis pedis intact bilat.   MSK: no deformity of the feet CV: trace bilat leg edema Skin:  no ulcer on the feet.  normal color and temp on the feet. Neuro: sensation is intact to touch on the feet, but decreased from normal.  Ext: there is bilateral onychomycosis of the toenails.    Lab Results  Component Value Date   HGBA1C 7.8 (A) 07/19/2020   Lab Results  Component Value Date   CREATININE 1.20 12/05/2019   BUN 18 12/05/2019   NA 141 12/05/2019   K 5.0 12/05/2019   CL 102 12/05/2019   CO2 28 12/05/2019       Assessment & Plan:  Insulin-requiring type 2 DM: overcontrolled.    Patient Instructions  check your blood sugar 4 times a day.  vary the time of day when you check, between before the 3 meals, and at bedtime.  also check if you have symptoms of your blood sugar being too high or too low.  please keep a record of the readings and bring it to your next appointment here (or you can bring the meter itself).  You can write it on any piece of paper.  please call us sooner if your blood sugar goes below 70, or if you have a lot of readings over 200.    Please reduce the insulin to 170 units each morning, and 20 units each evening   However, if you are going to be active that day, take just 140 units in the morning.   Please come back for a follow-up appointment in 3 months.

## 2020-08-09 ENCOUNTER — Other Ambulatory Visit: Payer: Self-pay | Admitting: Cardiovascular Disease

## 2020-08-12 DIAGNOSIS — H2513 Age-related nuclear cataract, bilateral: Secondary | ICD-10-CM | POA: Diagnosis not present

## 2020-08-12 DIAGNOSIS — H5203 Hypermetropia, bilateral: Secondary | ICD-10-CM | POA: Diagnosis not present

## 2020-08-12 DIAGNOSIS — E119 Type 2 diabetes mellitus without complications: Secondary | ICD-10-CM | POA: Diagnosis not present

## 2020-08-12 DIAGNOSIS — H401131 Primary open-angle glaucoma, bilateral, mild stage: Secondary | ICD-10-CM | POA: Diagnosis not present

## 2020-08-12 DIAGNOSIS — H524 Presbyopia: Secondary | ICD-10-CM | POA: Diagnosis not present

## 2020-08-12 LAB — HM DIABETES EYE EXAM

## 2020-08-13 ENCOUNTER — Encounter (HOSPITAL_COMMUNITY): Payer: Self-pay | Admitting: Emergency Medicine

## 2020-08-13 ENCOUNTER — Ambulatory Visit (INDEPENDENT_AMBULATORY_CARE_PROVIDER_SITE_OTHER): Payer: Medicare Other

## 2020-08-13 ENCOUNTER — Ambulatory Visit (HOSPITAL_COMMUNITY)
Admission: EM | Admit: 2020-08-13 | Discharge: 2020-08-13 | Disposition: A | Discharge status: Home / Self Care | Payer: Medicare Other | Attending: Physician Assistant | Admitting: Physician Assistant

## 2020-08-13 ENCOUNTER — Other Ambulatory Visit: Payer: Self-pay

## 2020-08-13 DIAGNOSIS — Z794 Long term (current) use of insulin: Secondary | ICD-10-CM | POA: Diagnosis not present

## 2020-08-13 DIAGNOSIS — Z23 Encounter for immunization: Secondary | ICD-10-CM

## 2020-08-13 DIAGNOSIS — S51812A Laceration without foreign body of left forearm, initial encounter: Secondary | ICD-10-CM

## 2020-08-13 DIAGNOSIS — S41112A Laceration without foreign body of left upper arm, initial encounter: Secondary | ICD-10-CM | POA: Diagnosis not present

## 2020-08-13 DIAGNOSIS — E119 Type 2 diabetes mellitus without complications: Secondary | ICD-10-CM | POA: Diagnosis not present

## 2020-08-13 MED ORDER — MUPIROCIN 2 % EX OINT: 1.0000 "application " | TOPICAL_OINTMENT | Freq: Every day | CUTANEOUS | 0 refills | Status: DC

## 2020-08-13 MED ORDER — TETANUS-DIPHTH-ACELL PERTUSSIS 5-2.5-18.5 LF-MCG/0.5 IM SUSY
PREFILLED_SYRINGE | INTRAMUSCULAR | Status: AC
  Filled 2020-08-13: qty 0.5

## 2020-08-13 MED ORDER — TETANUS-DIPHTH-ACELL PERTUSSIS 5-2.5-18.5 LF-MCG/0.5 IM SUSY
0.5000 mL | PREFILLED_SYRINGE | Freq: Once | INTRAMUSCULAR | Status: AC
  Administered 2020-08-13: 0.5 mL via INTRAMUSCULAR

## 2020-08-13 NOTE — ED Provider Notes (Signed)
[facility]    CSN: [id number] Arrival date & time: 08/13/20  1902      History   Chief Complaint Chief Complaint  Patient presents with   Laceration    Left lower arm    HPI Anthony Cuevas is a 70 y.o. male.   Patient presents today with a laceration to his left lower arm.  Reports that he lost his balance and fell hitting his arm on his burn barrel.  He does not believe that there is any retained foreign metal objects but is open to x-ray to ensure.  He irrigated area with water and applied a pressure bandage.  He has not cleaned it with any additional products.  He is unsure when his last tetanus was.  He is left-handed.  He reports normal active range of motion at wrist and hand.  Denies any numbness, weakness, paresthesias.   Past Medical History:  Diagnosis Date   Arthritis    Coronary atherosclerosis    a. 07/2018 CT Chest: 3 vessel coronary atherosclerosis; b. 07/2018 MV: EF 46% (GI uptake artifact), No ischemia/infarct. Mild to mod diffuse Ao atherosclerosis and at least moderate 2 vessel CAD (LAD/RCA) on CT imaging. Low risk.   Depression    improved on sertraline   Diabetes mellitus, type 2 ([place]) 09/1996   GERD (gastroesophageal reflux disease) 1990   History of peptic ulcer disease    Hyperlipidemia 1992   Hypertension 05/2003   Lumbar stenosis L2-3   Muscle atrophy    in arms from degenerative changes in neck   Neck pain    chronic   Smoker    Wears glasses     Patient Active Problem List   Diagnosis Date Noted   Abnormal TSH 12/15/2019   Decreased pulses in feet 07/06/2019   Left hip pain 07/06/2019   Left hand pain 07/10/2018   Lung nodule 07/10/2018   Lower back pain 07/10/2018   CAD (coronary artery disease) 01/06/2018   Joint pain 12/06/2017   Vertigo 12/06/2017   Trochanteric bursitis, left hip 03/21/2017   Healthcare maintenance 12/03/2016   Neck pain 12/03/2016   Depression 08/16/2015   Anemia, iron deficiency 05/21/2015   IDDM  (insulin dependent diabetes mellitus) 05/21/2015   History of lumbar laminectomy for spinal cord decompression 04/28/2015   Advance care planning 07/17/2014   Lumbar radiculopathy 04/17/2014   Medicare annual wellness visit, subsequent 12/07/2011   Osteoarthritis of left knee 11/20/2011    Class: End Stage   ED (erectile dysfunction) 06/01/2010   MUSCULAR WASTING AND DISUSE ATROPHY NEC 06/09/2009   Vitamin D deficiency 01/28/2009   CARPAL TUNNEL SYNDROME, BILATERAL 07/13/2006   Diabetes mellitus type II, uncontrolled ([place]) 06/25/2006   HLD (hyperlipidemia) 06/25/2006   Essential hypertension 06/25/2006   Cervical disc disorder 06/22/2006   TENDINITIS, CALCIFIC, SHOULDER, LEFT 06/22/2006   ELEVATED PROSTATE SPECIFIC ANTIGEN 06/22/2006    Past Surgical History:  Procedure Laterality Date   ABDOMINAL AORTOGRAM W/LOWER EXTREMITY N/A 09/03/2019   Procedure: ABDOMINAL AORTOGRAM W/LOWER EXTREMITY;  Surgeon: Wellington Hampshire, MD;  Location: [facility];  Service: Cardiovascular;  Laterality: N/A;  Limited Study   CERVICAL [place] SURGERY  04/19/02   fusion, Dr. Lorin Mercy   CERVICAL DISCECTOMY  1997   partial   CERVICAL DISCECTOMY  2000   COLONOSCOPY WITH ESOPHAGOGASTRODUODENOSCOPY (EGD)  06/25/2015   HEMORRHOID SURGERY  1989   KNEE ARTHROPLASTY  11/20/2011   Procedure: COMPUTER ASSISTED TOTAL KNEE ARTHROPLASTY;  Surgeon: Marybelle Killings,  MD;  Location: [facility];  Service: Orthopedics;  Laterality: Left;  Conversion Left Knee Medial Uni to Total Knee Arthroplasty-Cemented   LUMBAR LAMINECTOMY/DECOMPRESSION MICRODISCECTOMY N/A 04/28/2015   Procedure: L2-3 Decompression;  Surgeon: Marybelle Killings, MD;  Location: [facility];  Service: Orthopedics;  Laterality: N/A;   MR MRA DUPLICATE EXAM  INACTIVATE     MULTIPLE TOOTH EXTRACTIONS     Neisen funduplication  2774   PERIPHERAL VASCULAR INTERVENTION  09/03/2019   Procedure: PERIPHERAL VASCULAR INTERVENTION;  Surgeon: Wellington Hampshire, MD;  Location: [facility];  Service: Cardiovascular;;  Iliac Stent   REPLACEMENT TOTAL KNEE  02/14/02, 05/04/02   left- partial   SCC EXCISION  04/25/01   Dr. March Rummage   STOMACH SURGERY  1984   PUD, [place]       Home Medications    Prior to Admission medications   Medication Sig Start Date End Date Taking? Authorizing Provider  mupirocin ointment (BACTROBAN) 2 % Apply 1 application topically daily. 08/13/20  Yes Linette Gunderson, Derry Skill, PA-C  aspirin EC 81 MG tablet Take 1 tablet (81 mg total) by mouth daily. Swallow whole. 08/14/19   Wellington Hampshire, MD  Cholecalciferol (VITAMIN D3) 125 MCG (5000 UT) CAPS Take 5,000 Units by mouth 2 (two) times a week.    [provider]  clopidogrel (PLAVIX) 75 MG tablet TAKE 1 TABLET DAILY 08/09/20   Wellington Hampshire, MD  Continuous Blood Gluc Sensor (FREESTYLE LIBRE 14 DAY SENSOR) MISC 1 Device by Does not apply route every 14 (fourteen) days. 10/07/19   Renato Shin, MD  cyanocobalamin 1000 MCG tablet Take 1,000 mcg by mouth daily.    [provider]  cyclobenzaprine (FLEXERIL) 10 MG tablet TAKE 1 TABLET TWICE A DAY AS NEEDED FOR MUSCLE SPASMS (SEDATION CAUTION) 07/05/20   Tonia Ghent, MD  Insulin NPH, Human,, Isophane, (NOVOLIN N FLEXPEN) 100 UNIT/ML Kiwkpen 170 units each morning, and 20 units each evening, and pen needles 3/day. 07/19/20   Renato Shin, MD  latanoprost (XALATAN) 0.005 % ophthalmic solution 1 drop at bedtime. 09/19/19   [provider]  lisinopril (ZESTRIL) 40 MG tablet Take 1 tablet (40 mg total) by mouth daily. 06/15/20   Theora Gianotti, NP  metFORMIN (GLUCOPHAGE) 1000 MG tablet TAKE 1 TABLET TWICE A DAY WITH MEALS 03/30/20   Renato Shin, MD  Omega-3 Fatty Acids (FISH OIL PO) Take 1,000 mg by mouth once a week.    [provider]  [facility] ULTRA test strip USE AS INSTRUCTED TO TEST BLOOD SUGARS 3 OR 4 TIMES DAILY 05/22/19   Tonia Ghent, MD  rosuvastatin (CRESTOR) 20 MG tablet Take 1 tablet (20 mg total) by mouth  daily. 03/23/20 06/21/20  Wellington Hampshire, MD  SURE COMFORT PEN NEEDLES 32G X 4 MM MISC USE THREE TIMES A DAY 10/31/19   Renato Shin, MD  traMADol (ULTRAM) 50 MG tablet TAKE 1 TABLET EVERY 8 HOURS AS NEEDED FOR CHRONIC JOINT/BACK PAIN (SEDATION CAUTION) 03/24/20   Tonia Ghent, MD    Family History Family History  Problem Relation Age of Onset   Diabetes Mother    Hypertension Mother    Stroke Mother    Diabetes Sister    Diabetes Brother    Diabetes Brother    Heart disease Brother    Diabetes Father    Breast cancer Maternal Grandmother        breast   Lung disease Paternal Grandfather  black lung   Arthritis Neg Hx    Prostate cancer Neg Hx    Colon cancer Neg Hx    Stomach cancer Neg Hx     Social History Social History   Tobacco Use   Smoking status: Former    Packs/day: 1.00    Years: 41.00    Pack years: 41.00    Types: Cigarettes    Quit date: 2014    Years since quitting: 8.5   Smokeless tobacco: Never  Vaping Use   Vaping Use: Never used  Substance Use Topics   Alcohol use: No    Alcohol/week: 0.0 standard drinks   Drug use: No     Allergies   Patient has no known allergies.   Review of Systems Review of Systems  Constitutional:  Negative for activity change, appetite change, fatigue and fever.  Respiratory:  Negative for cough and shortness of breath.   Cardiovascular:  Negative for chest pain.  Gastrointestinal:  Negative for abdominal pain, diarrhea, nausea and vomiting.  Musculoskeletal:  Negative for arthralgias and myalgias.  Skin:  Positive for wound. Negative for color change.  Neurological:  Negative for dizziness, weakness, light-headedness, numbness and headaches.    Physical Exam Triage Vital Signs ED Triage Vitals  Enc Vitals Group     BP 08/13/20 1923 (!) 150/95     Pulse Rate 08/13/20 1923 (!) 118     Resp 08/13/20 1923 16     Temp 08/13/20 1923 98.1 F (36.7 C)     Temp Source 08/13/20 1923 Oral     SpO2  08/13/20 1923 96 %     Weight --      Height --      Head Circumference --      Peak Flow --      Pain Score 08/13/20 1924 0     Pain Loc --      Pain Edu? --      Excl. in [place]? --    No data found.  Updated Vital Signs BP (!) 150/95   Pulse (!) 103   Temp 98.1 F (36.7 C) (Oral)   Resp 16   SpO2 96%   Visual Acuity Right Eye Distance:   Left Eye Distance:   Bilateral Distance:    Right Eye Near:   Left Eye Near:    Bilateral Near:     Physical Exam Vitals reviewed.  Constitutional:      General: He is awake.     Appearance: Normal appearance. He is normal weight. He is not ill-appearing.     Comments: Very pleasant male appears stated age in no acute distress  HENT:     Head: Normocephalic and atraumatic.  Cardiovascular:     Rate and Rhythm: Normal rate and regular rhythm.     Heart sounds: Normal heart sounds, S1 normal and S2 normal. No murmur heard. Pulmonary:     Effort: Pulmonary effort is normal.     Breath sounds: Normal breath sounds. No stridor. No wheezing, rhonchi or rales.     Comments: Clear to auscultation bilaterally Musculoskeletal:     Comments: Left hand neurovascularly intact.  Skin:    Findings: Laceration present.     Comments: 12 cm laceration noted left dorsal forearm.  No active bleeding or drainage.  Neurological:     Mental Status: He is alert.  Psychiatric:        Behavior: Behavior is cooperative.     UC Treatments / Results  Labs (  all labs ordered are listed, but only abnormal results are displayed) Labs Reviewed - No data to display  EKG   Radiology DG Forearm Left  Result Date: 08/13/2020 CLINICAL DATA:  Laceration on forearm.  Rule out foreign body EXAM: LEFT FOREARM - 2 VIEW COMPARISON:  None. FINDINGS: Negative for foreign body. Laceration in the mid forearm. No skeletal abnormality. IMPRESSION: Negative for fracture or foreign body. Electronically Signed   By: Franchot Gallo M.D.   On: 08/13/2020 19:50     Procedures Laceration Repair  Date/Time: 08/13/2020 8:50 PM Performed by: Terrilee Croak, PA-C Authorized by: Terrilee Croak, PA-C   Consent:    Consent obtained:  Verbal   Consent given by:  Patient   Risks, benefits, and alternatives were discussed: yes     Risks discussed:  Infection, pain, poor cosmetic result, nerve damage and poor wound healing   Alternatives discussed:  Observation, referral and no treatment Universal protocol:    Procedure explained and questions answered to patient or proxy's satisfaction: yes     Patient identity confirmed:  Verbally with patient Anesthesia:    Anesthesia method:  Local infiltration   Local anesthetic:  Lidocaine 1% WITH epi Laceration details:    Location:  Shoulder/arm   Shoulder/arm location:  L lower arm   Length (cm):  12   Depth (mm):  4 Pre-procedure details:    Preparation:  Patient was prepped and draped in usual sterile fashion Exploration:    Limited defect created (wound extended): yes     Hemostasis achieved with:  Direct pressure and epinephrine   Imaging obtained: x-ray     Imaging outcome: foreign body not noted     Wound exploration: entire depth of wound visualized     Contaminated: no   Treatment:    Area cleansed with:  Chlorhexidine   Amount of cleaning:  Standard   Irrigation solution:  Sterile saline   Irrigation volume:  10 mL   Irrigation method:  Syringe   Debridement:  None   Scar revision: no   Skin repair:    Repair method:  Sutures   Suture size:  4-0   Suture material:  Prolene   Suture technique:  Simple interrupted   Number of sutures:  13 Approximation:    Approximation:  Close Repair type:    Repair type:  Simple Post-procedure details:    Dressing:  Non-adherent dressing   Procedure completion:  Tolerated well, no immediate complications (including critical care time)  Medications Ordered in UC Medications  Tdap (BOOSTRIX) injection 0.5 mL (0.5 mLs Intramuscular Given 08/13/20  1947)    Initial Impression / Assessment and Plan / UC Course  I have reviewed the triage vital signs and the nursing notes.  Pertinent labs & imaging results that were available during my care of the patient were reviewed by me and considered in my medical decision making (see chart for details).      Laceration repaired in clinic.  See procedure note above.  Tetanus updated.  Patient was encouraged to keep area dry for several days and then apply antibiotic ointment as needed.  Discussed signs of infection that would warrant reevaluation.  She return precautions given to which patient expressed understanding.  He is to return in 10 to 14 days for suture removal.  Final Clinical Impressions(s) / UC Diagnoses   Final diagnoses:  Arm laceration, left, initial encounter     Discharge Instructions      Try to keep  area clean for 24 to 48 hours.  Use Bactroban with dressing changes.  If you have any signs of infection such as redness, swelling, drainage, fever, red streaking you need to be reevaluated.  Please return in 10 to 14 days to have sutures removed unless needed to be seen sooner.     ED Prescriptions     Medication Sig Dispense Auth. Provider   mupirocin ointment (BACTROBAN) 2 % Apply 1 application topically daily. 22 g Jeremaih Klima K, PA-C      PDMP not reviewed this encounter.   Terrilee Croak, PA-C 08/13/20 2052

## 2020-08-13 NOTE — ED Triage Notes (Signed)
Large laceration to left lower arm. Has irrigated area at home x2. Occurred 45 minutes ago, caused by edge of a barrel

## 2020-08-13 NOTE — Discharge Instructions (Addendum)
Try to keep area clean for 24 to 48 hours.  Use Bactroban with dressing changes.  If you have any signs of infection such as redness, swelling, drainage, fever, red streaking you need to be reevaluated.  Please return in 10 to 14 days to have sutures removed unless needed to be seen sooner.

## 2020-08-24 ENCOUNTER — Encounter (HOSPITAL_COMMUNITY): Payer: Self-pay

## 2020-08-24 ENCOUNTER — Other Ambulatory Visit: Payer: Self-pay

## 2020-08-24 ENCOUNTER — Ambulatory Visit (HOSPITAL_COMMUNITY)
Admission: RE | Admit: 2020-08-24 | Discharge: 2020-08-24 | Disposition: A | Discharge status: Home / Self Care | Payer: Medicare Other | Source: Ambulatory Visit | Attending: Family Medicine | Admitting: Family Medicine

## 2020-08-24 DIAGNOSIS — Z4802 Encounter for removal of sutures: Secondary | ICD-10-CM | POA: Diagnosis not present

## 2020-08-24 NOTE — ED Triage Notes (Signed)
Pt presents today for suture removal

## 2020-08-24 NOTE — Discharge Instructions (Signed)
13 sutures removed from Lt anterior forearm . Incision clean dry and intact

## 2020-08-31 ENCOUNTER — Other Ambulatory Visit: Payer: Self-pay | Admitting: Family Medicine

## 2020-09-01 NOTE — Telephone Encounter (Signed)
Sent. Thanks.   

## 2020-09-01 NOTE — Telephone Encounter (Signed)
Refill request for Tramadol 50 mg tablets  LOV - 12/11/19 Next OV - not scheduled Last refill - 03/24/20 #270/1

## 2020-09-12 DIAGNOSIS — E119 Type 2 diabetes mellitus without complications: Secondary | ICD-10-CM | POA: Diagnosis not present

## 2020-09-12 DIAGNOSIS — Z794 Long term (current) use of insulin: Secondary | ICD-10-CM | POA: Diagnosis not present

## 2020-09-13 ENCOUNTER — Other Ambulatory Visit: Payer: Self-pay

## 2020-09-13 ENCOUNTER — Ambulatory Visit (INDEPENDENT_AMBULATORY_CARE_PROVIDER_SITE_OTHER): Payer: Medicare Other

## 2020-09-13 ENCOUNTER — Other Ambulatory Visit: Payer: Self-pay | Admitting: Nurse Practitioner

## 2020-09-13 DIAGNOSIS — Z95828 Presence of other vascular implants and grafts: Secondary | ICD-10-CM

## 2020-09-13 DIAGNOSIS — I739 Peripheral vascular disease, unspecified: Secondary | ICD-10-CM | POA: Diagnosis not present

## 2020-09-16 ENCOUNTER — Other Ambulatory Visit: Payer: Self-pay

## 2020-09-16 DIAGNOSIS — I739 Peripheral vascular disease, unspecified: Secondary | ICD-10-CM

## 2020-09-20 ENCOUNTER — Ambulatory Visit: Payer: Medicare Other | Admitting: Cardiovascular Disease

## 2020-09-26 ENCOUNTER — Encounter: Payer: Self-pay | Admitting: Gastroenterology

## 2020-09-28 ENCOUNTER — Other Ambulatory Visit: Payer: Self-pay

## 2020-09-28 ENCOUNTER — Ambulatory Visit (INDEPENDENT_AMBULATORY_CARE_PROVIDER_SITE_OTHER): Payer: Medicare Other | Admitting: Cardiovascular Disease

## 2020-09-28 ENCOUNTER — Encounter: Payer: Self-pay | Admitting: Cardiovascular Disease

## 2020-09-28 VITALS — BP 130/70 | HR 89 | Ht 70.0 in | Wt 215.0 lb

## 2020-09-28 DIAGNOSIS — I10 Essential (primary) hypertension: Secondary | ICD-10-CM

## 2020-09-28 DIAGNOSIS — E785 Hyperlipidemia, unspecified: Secondary | ICD-10-CM | POA: Diagnosis not present

## 2020-09-28 DIAGNOSIS — I739 Peripheral vascular disease, unspecified: Secondary | ICD-10-CM

## 2020-09-28 DIAGNOSIS — I2510 Atherosclerotic heart disease of native coronary artery without angina pectoris: Secondary | ICD-10-CM | POA: Diagnosis not present

## 2020-09-28 NOTE — Patient Instructions (Signed)
Your physician has recommended you make the following change in your medication:   STOP Aspirin. Continue your other medications as prescribed.   *If you need a refill on your cardiac medications before your next appointment, please call your pharmacy*   Lab Work: Lipid and Lft today  If you have labs (blood work) drawn today and your tests are completely normal, you will receive your results only by: [place] (if you have MyChart) OR A paper copy in the mail If you have any lab test that is abnormal or we need to change your treatment, we will call you to review the results.   Testing/Procedures: None ordered    Follow-Up: At [facility], you and your health needs are our priority.  As part of our continuing mission to provide you with exceptional heart care, we have created designated Provider Care Teams.  These Care Teams include your primary Cardiologist (physician) and Advanced Practice Providers (APPs -  Physician Assistants and Nurse Practitioners) who all work together to provide you with the care you need, when you need it.  We recommend signing up for the patient portal called "MyChart".  Sign up information is provided on this After Visit Summary.  MyChart is used to connect with patients for Virtual Visits (Telemedicine).  Patients are able to view lab/test results, encounter notes, upcoming appointments, etc.  Non-urgent messages can be sent to your provider as well.   To learn more about what you can do with MyChart, go to [email or web address].    Your next appointment:   Your physician wants you to follow-up in: 1 year You will receive a reminder letter in the mail two months in advance. If you don't receive a letter, please call our office to schedule the follow-up appointment.   The format for your next appointment:   In Person  Provider:   Kathlyn Sacramento, MD   Other Instructions N/A

## 2020-09-28 NOTE — Progress Notes (Signed)
Cardiology Office Note   Date:  09/28/2020   ID:  Anthony, Cuevas [birth year], MRN [id number]  PCP:  Anthony Ghent, MD  Cardiologist:  Dr. Rockey Cuevas  Chief Complaint  Patient presents with   Other    6 month follow up. Meds reviewed verbally with patient.       History of Present Illness: Anthony Cuevas is a 70 y.o. male who is here today for follow-up visit regarding peripheral arterial disease.He has known history of essential hypertension, hyperlipidemia, diabetes mellitus, remote tobacco use and coronary atherosclerosis noted on previous CT scan. He underwent stress testing in July 2020 which showed no evidence of ischemia or infarct.  He is status post covered stent placement to the left common iliac artery in August 2021 for severe claudication. He has been doing well with no recurrent claudications.  No chest pain, shortness of breath or palpitations.  In last visit, I switched him from niacin/pravastatin to rosuvastatin.  Past Medical History:  Diagnosis Date   Arthritis    Coronary atherosclerosis    a. 07/2018 CT Chest: 3 vessel coronary atherosclerosis; b. 07/2018 MV: EF 46% (GI uptake artifact), No ischemia/infarct. Mild to mod diffuse Ao atherosclerosis and at least moderate 2 vessel CAD (LAD/RCA) on CT imaging. Low risk.   Depression    improved on sertraline   Diabetes mellitus, type 2 ([place]) 09/1996   GERD (gastroesophageal reflux disease) 1990   History of peptic ulcer disease    Hyperlipidemia 1992   Hypertension 05/2003   Lumbar stenosis L2-3   Muscle atrophy    in arms from degenerative changes in neck   Neck pain    chronic   Smoker    Wears glasses     Past Surgical History:  Procedure Laterality Date   ABDOMINAL AORTOGRAM W/LOWER EXTREMITY N/A 09/03/2019   Procedure: ABDOMINAL AORTOGRAM W/LOWER EXTREMITY;  Surgeon: Anthony Hampshire, MD;  Location: [facility];  Service: Cardiovascular;  Laterality: N/A;  Limited Study   CERVICAL  DISC SURGERY  04/19/02   fusion, Dr. Lorin Cuevas   CERVICAL DISCECTOMY  1997   partial   CERVICAL DISCECTOMY  2000   COLONOSCOPY WITH ESOPHAGOGASTRODUODENOSCOPY (EGD)  06/25/2015   HEMORRHOID SURGERY  1989   KNEE ARTHROPLASTY  11/20/2011   Procedure: COMPUTER ASSISTED TOTAL KNEE ARTHROPLASTY;  Surgeon: Anthony Killings, MD;  Location: [facility];  Service: Orthopedics;  Laterality: Left;  Conversion Left Knee Medial Uni to Total Knee Arthroplasty-Cemented   LUMBAR LAMINECTOMY/DECOMPRESSION MICRODISCECTOMY N/A 04/28/2015   Procedure: L2-3 Decompression;  Surgeon: Anthony Killings, MD;  Location: [place];  Service: Orthopedics;  Laterality: N/A;   MR MRA DUPLICATE EXAM  INACTIVATE     MULTIPLE TOOTH EXTRACTIONS     Neisen funduplication  0000000   PERIPHERAL VASCULAR INTERVENTION  09/03/2019   Procedure: PERIPHERAL VASCULAR INTERVENTION;  Surgeon: Anthony Hampshire, MD;  Location: [facility];  Service: Cardiovascular;;  Iliac Stent   REPLACEMENT TOTAL KNEE  02/14/02, 05/04/02   left- partial   SCC EXCISION  04/25/01   Dr. March Cuevas   STOMACH SURGERY  1984   PUD, [facility]     Current Outpatient Medications  Medication Sig Dispense Refill   aspirin EC 81 MG tablet Take 1 tablet (81 mg total) by mouth daily. Swallow whole. 90 tablet 3   Cholecalciferol (VITAMIN D3) 125 MCG (5000 UT) CAPS Take 5,000 Units by mouth 2 (two) times a week.     clopidogrel (PLAVIX)  75 MG tablet TAKE 1 TABLET DAILY 90 tablet 0   Continuous Blood Gluc Sensor (FREESTYLE LIBRE 14 DAY SENSOR) MISC 1 Device by Does not apply route every 14 (fourteen) days. 6 each 3   cyanocobalamin 1000 MCG tablet Take 1,000 mcg by mouth daily.     cyclobenzaprine (FLEXERIL) 10 MG tablet TAKE 1 TABLET TWICE A DAY AS NEEDED FOR MUSCLE SPASMS (SEDATION CAUTION) 180 tablet 1   Insulin NPH, Human,, Isophane, (NOVOLIN N FLEXPEN) 100 UNIT/ML Kiwkpen 170 units each morning, and 20 units each evening, and pen needles 3/day. 195 mL 3   latanoprost (XALATAN) 0.005 % ophthalmic  solution 1 drop at bedtime.     lisinopril (ZESTRIL) 40 MG tablet TAKE 1 TABLET DAILY 90 tablet 3   metFORMIN (GLUCOPHAGE) 1000 MG tablet TAKE 1 TABLET TWICE A DAY WITH MEALS 180 tablet 3   mupirocin ointment (BACTROBAN) 2 % Apply 1 application topically daily. 22 g 0   Omega-3 Fatty Acids (FISH OIL PO) Take 1,000 mg by mouth once a week.     ONETOUCH ULTRA test strip USE AS INSTRUCTED TO TEST BLOOD SUGARS 3 OR 4 TIMES DAILY 100 each 11   SURE COMFORT PEN NEEDLES 32G X 4 MM MISC USE THREE TIMES A DAY 100 each 10   traMADol (ULTRAM) 50 MG tablet TAKE 1 TABLET EVERY 8 HOURS AS NEEDED FOR CHRONIC JOINT/BACK PAIN (SEDATION CAUTION) 270 tablet 0   rosuvastatin (CRESTOR) 20 MG tablet Take 1 tablet (20 mg total) by mouth daily. 90 tablet 3   No current facility-administered medications for this visit.    Allergies:   Patient has no known allergies.    Social History:  The patient  reports that he quit smoking about 8 years ago. His smoking use included cigarettes. He has a 41.00 pack-year smoking history. He has never used smokeless tobacco. He reports that he does not drink alcohol and does not use drugs.   Family History:  The patient's family history includes Breast cancer in his maternal grandmother; Diabetes in his brother, brother, father, mother, and sister; Heart disease in his brother; Hypertension in his mother; Lung disease in his paternal grandfather; Stroke in his mother.    ROS:  Please see the history of present illness.   Otherwise, review of systems are positive for none.   All other systems are reviewed and negative.    PHYSICAL EXAM: VS:  BP 130/70 (BP Location: Left Arm, Patient Position: Sitting, Cuff Size: Normal)   Pulse 89   Ht '5\' 10"'$  (1.778 m)   Wt 215 lb (97.5 kg)   SpO2 97%   BMI 30.85 kg/m  , BMI Body mass index is 30.85 kg/m. GEN: Well nourished, well developed, in no acute distress  HEENT: normal  Neck: no JVD, carotid bruits, or masses Cardiac: RRR; no  murmurs, rubs, or gallops,no edema  Respiratory:  clear to auscultation bilaterally, normal work of breathing GI: soft, nontender, nondistended, + BS MS: no deformity or atrophy  Skin: warm and dry, no rash Neuro:  Strength and sensation are intact Psych: euthymic mood, full affect Vascular:  Distal pulses are palpable.      EKG:  EKG is  ordered today. EKG showed normal sinus rhythm with no significant ST or T wave changes.   Recent Labs: 12/05/2019: ALT 31; BUN 18; Creatinine, Ser 1.20; Hemoglobin 13.9; Platelets 212.0; Potassium 5.0; Sodium 141; TSH 9.43    Lipid Panel    Component Value Date/Time   CHOL  135 12/05/2019 0753   TRIG 204.0 (H) 12/05/2019 0753   HDL 34.10 (L) 12/05/2019 0753   CHOLHDL 4 12/05/2019 0753   VLDL 40.8 (H) 12/05/2019 0753   LDLCALC 68 07/13/2014 0814   LDLDIRECT 78.0 12/05/2019 0753      Wt Readings from Last 3 Encounters:  09/28/20 215 lb (97.5 kg)  07/19/20 215 lb (97.5 kg)  04/14/20 218 lb 9.6 oz (99.2 kg)      No flowsheet data found.    ASSESSMENT AND PLAN:  1.  Peripheral arterial disease: Status post  left common iliac artery covered stent placement due to an occlusion associated with severe left leg claudication.  Recent vascular testing showed normal ABI bilaterally.  There was borderline stenosis in the left external iliac artery.  Currently with no symptoms.  Recommend continuing medical therapy.  I discontinued aspirin today and asked him to continue clopidogrel 75 mg once daily.  Repeat arterial Doppler studies in August 2023.    2.  Coronary atherosclerosis: Negative stress testing last year.  Currently with no anginal symptoms.  3.  Essential hypertension: Blood pressure is well controlled on current medications.  4.  Hyperlipidemia: The patient was switched to rosuvastatin in February.  I requested a follow-up lipid and liver profile today.    Disposition:   FU with me in 12 months  Signed,  Kathlyn Sacramento, MD   09/28/2020 2:40 PM    [facility]

## 2020-09-29 LAB — LIPID PANEL
Chol/HDL Ratio: 3.6 ratio (ref 0.0–5.0)
Cholesterol, Total: 108 mg/dL (ref 100–199)
HDL: 30 mg/dL — ABNORMAL LOW (ref >39)
LDL Chol Calc (NIH): 45 mg/dL (ref 0–99)
Triglycerides: 202 mg/dL — ABNORMAL HIGH (ref 0–149)
VLDL Cholesterol Cal: 33 mg/dL (ref 5–40)

## 2020-09-29 LAB — HEPATIC FUNCTION PANEL
ALT: 34 IU/L (ref 0–44)
AST: 47 IU/L — ABNORMAL HIGH (ref 0–40)
Albumin: 4.1 g/dL (ref 3.8–4.8)
Alkaline Phosphatase: 75 IU/L (ref 44–121)
Bilirubin Total: 0.3 mg/dL (ref 0.0–1.2)
Bilirubin, Direct: 0.15 mg/dL (ref 0.00–0.40)
Total Protein: 7.2 g/dL (ref 6.0–8.5)

## 2020-10-12 DIAGNOSIS — Z794 Long term (current) use of insulin: Secondary | ICD-10-CM | POA: Diagnosis not present

## 2020-10-12 DIAGNOSIS — E119 Type 2 diabetes mellitus without complications: Secondary | ICD-10-CM | POA: Diagnosis not present

## 2020-11-06 ENCOUNTER — Encounter (HOSPITAL_COMMUNITY): Payer: Self-pay | Admitting: Emergency Medicine

## 2020-11-06 ENCOUNTER — Ambulatory Visit (HOSPITAL_COMMUNITY)
Admission: EM | Admit: 2020-11-06 | Discharge: 2020-11-06 | Disposition: A | Discharge status: Home / Self Care | Payer: Medicare Other | Attending: Physician Assistant | Admitting: Physician Assistant

## 2020-11-06 ENCOUNTER — Other Ambulatory Visit: Payer: Self-pay

## 2020-11-06 ENCOUNTER — Ambulatory Visit (INDEPENDENT_AMBULATORY_CARE_PROVIDER_SITE_OTHER): Payer: Medicare Other

## 2020-11-06 DIAGNOSIS — M25512 Pain in left shoulder: Secondary | ICD-10-CM

## 2020-11-06 DIAGNOSIS — R000 Tachycardia, unspecified: Secondary | ICD-10-CM

## 2020-11-06 DIAGNOSIS — M542 Cervicalgia: Secondary | ICD-10-CM | POA: Diagnosis not present

## 2020-11-06 MED ORDER — TIZANIDINE HCL 4 MG PO CAPS: 4.0000 mg | ORAL_CAPSULE | Freq: Two times a day (BID) | ORAL | 0 refills | Status: DC | PRN

## 2020-11-06 NOTE — ED Triage Notes (Signed)
Pt reports he worked in his daughter's yard 2 weeks ago and has had muscle pain and stiffnes

## 2020-11-06 NOTE — Discharge Instructions (Addendum)
I believe that you have a muscle strain but cannot rule out a cardiovascular event.  As we discussed, the safest thing to do would be to go to the emergency room.  If any point have worsening symptoms including more persistent pain, shortness of breath, nausea, vomiting, lightheadedness, dizziness you need to go to the ER.  Please stop cyclobenzaprine and started tizanidine.  This make you sleepy do not drive or drink alcohol while taking it.  I recommend you use heat, rest, stretch, topical medications for symptom relief.  As we discussed, your EKG had a few changes so I do recommend that you follow-up with your cardiologist as soon as possible if you do not need to go to the emergency room over the weekend.  Follow-up with your PCP next week to consider referral to physical therapy and/or orthopedics.

## 2020-11-06 NOTE — ED Triage Notes (Signed)
Pt is present today with left shoulder pain. Pt states that the pain started two weeks ago. Pt states that he noticed the pain after doing yard work and now the pain is radiating up his neck

## 2020-11-06 NOTE — ED Provider Notes (Signed)
[place]    CSN: [id number] Arrival date & time: 11/06/20  1244      History   Chief Complaint Chief Complaint  Patient presents with   Shoulder Pain    HPI MARKEVION LATTIN is a 70 y.o. male.   Patient presents today with a 2-week history of worsening left shoulder pain.  He reports that prior to symptoms starting he had been using a chainsaw on a long pole and wonders if he could have injured himself.  He denies any known injury or increase in activity prior to symptom onset.  Reports pain is constant and is rated 8 on a 0-10 pain scale, localized to infraclavicular area with radiation over to trapezius and lateral shoulder, described as aching, worse with certain movements, no alleviating factors identified.  He has tried cyclobenzaprine without improvement of symptoms.  He has also tried acetaminophen.  He does have a history of neck pain that improved with previous surgery.  He is left-handed.  He does have a history of CAD and states current symptoms are not similar to previous episodes of this condition but he is open to EKG and further evaluation given high risk and presentation of pain.  He denies any shortness of breath, headache, dizziness, nausea, vomiting, lightheadedness, dizziness.   Past Medical History:  Diagnosis Date   Arthritis    Coronary atherosclerosis    a. 07/2018 CT Chest: 3 vessel coronary atherosclerosis; b. 07/2018 MV: EF 46% (GI uptake artifact), No ischemia/infarct. Mild to mod diffuse Ao atherosclerosis and at least moderate 2 vessel CAD (LAD/RCA) on CT imaging. Low risk.   Depression    improved on sertraline   Diabetes mellitus, type 2 ([place]) 09/1996   GERD (gastroesophageal reflux disease) 1990   History of peptic ulcer disease    Hyperlipidemia 1992   Hypertension 05/2003   Lumbar stenosis L2-3   Muscle atrophy    in arms from degenerative changes in neck   Neck pain    chronic   Smoker    Wears glasses     Patient Active Problem  List   Diagnosis Date Noted   Abnormal TSH 12/15/2019   Decreased pulses in feet 07/06/2019   Left hip pain 07/06/2019   Left hand pain 07/10/2018   Lung nodule 07/10/2018   Lower back pain 07/10/2018   CAD (coronary artery disease) 01/06/2018   Joint pain 12/06/2017   Vertigo 12/06/2017   Trochanteric bursitis, left hip 03/21/2017   Healthcare maintenance 12/03/2016   Neck pain 12/03/2016   Depression 08/16/2015   Anemia, iron deficiency 05/21/2015   IDDM (insulin dependent diabetes mellitus) 05/21/2015   History of lumbar laminectomy for spinal cord decompression 04/28/2015   Advance care planning 07/17/2014   Lumbar radiculopathy 04/17/2014   Medicare annual wellness visit, subsequent 12/07/2011   Osteoarthritis of left knee 11/20/2011    Class: End Stage   ED (erectile dysfunction) 06/01/2010   MUSCULAR WASTING AND DISUSE ATROPHY NEC 06/09/2009   Vitamin D deficiency 01/28/2009   CARPAL TUNNEL SYNDROME, BILATERAL 07/13/2006   Diabetes mellitus type II, uncontrolled 06/25/2006   HLD (hyperlipidemia) 06/25/2006   Essential hypertension 06/25/2006   Cervical disc disorder 06/22/2006   TENDINITIS, CALCIFIC, SHOULDER, LEFT 06/22/2006   ELEVATED PROSTATE SPECIFIC ANTIGEN 06/22/2006    Past Surgical History:  Procedure Laterality Date   ABDOMINAL AORTOGRAM W/LOWER EXTREMITY N/A 09/03/2019   Procedure: ABDOMINAL AORTOGRAM W/LOWER EXTREMITY;  Surgeon: Wellington Hampshire, MD;  Location: [facility];  Service:  Cardiovascular;  Laterality: N/A;  Limited Study   CERVICAL DISC SURGERY  04/19/02   fusion, Dr. Lorin Mercy   CERVICAL DISCECTOMY  1997   partial   CERVICAL DISCECTOMY  2000   COLONOSCOPY WITH ESOPHAGOGASTRODUODENOSCOPY (EGD)  06/25/2015   HEMORRHOID SURGERY  1989   KNEE ARTHROPLASTY  11/20/2011   Procedure: COMPUTER ASSISTED TOTAL KNEE ARTHROPLASTY;  Surgeon: Marybelle Killings, MD;  Location: [facility];  Service: Orthopedics;  Laterality: Left;  Conversion Left Knee Medial Uni to  Total Knee Arthroplasty-Cemented   LUMBAR LAMINECTOMY/DECOMPRESSION MICRODISCECTOMY N/A 04/28/2015   Procedure: L2-3 Decompression;  Surgeon: Marybelle Killings, MD;  Location: [place];  Service: Orthopedics;  Laterality: N/A;   MR MRA DUPLICATE EXAM  INACTIVATE     MULTIPLE TOOTH EXTRACTIONS     Neisen funduplication  [id number]   PERIPHERAL VASCULAR INTERVENTION  09/03/2019   Procedure: PERIPHERAL VASCULAR INTERVENTION;  Surgeon: Wellington Hampshire, MD;  Location: [facility];  Service: Cardiovascular;;  Iliac Stent   REPLACEMENT TOTAL KNEE  02/14/02, 05/04/02   left- partial   SCC EXCISION  04/25/01   Dr. March Rummage   STOMACH SURGERY  1984   PUD, [place]       Home Medications    Prior to Admission medications   Medication Sig Start Date End Date Taking? Authorizing Provider  tiZANidine (ZANAFLEX) 4 MG capsule Take 1 capsule (4 mg total) by mouth 2 (two) times daily as needed for muscle spasms. 11/06/20  Yes Emmary Culbreath, Derry Skill, PA-C  Cholecalciferol (VITAMIN D3) 125 MCG (5000 UT) CAPS Take 5,000 Units by mouth 2 (two) times a week.    [provider]  clopidogrel (PLAVIX) 75 MG tablet TAKE 1 TABLET DAILY 08/09/20   Wellington Hampshire, MD  Continuous Blood Gluc Sensor (FREESTYLE LIBRE 14 DAY SENSOR) MISC 1 Device by Does not apply route every 14 (fourteen) days. 10/07/19   Renato Shin, MD  cyanocobalamin 1000 MCG tablet Take 1,000 mcg by mouth daily.    [provider]  Insulin NPH, Human,, Isophane, (NOVOLIN N FLEXPEN) 100 UNIT/ML Kiwkpen 170 units each morning, and 20 units each evening, and pen needles 3/day. 07/19/20   Renato Shin, MD  latanoprost (XALATAN) 0.005 % ophthalmic solution 1 drop at bedtime. 09/19/19   [provider]  lisinopril (ZESTRIL) 40 MG tablet TAKE 1 TABLET DAILY 09/13/20   Wellington Hampshire, MD  metFORMIN (GLUCOPHAGE) 1000 MG tablet TAKE 1 TABLET TWICE A DAY WITH MEALS 03/30/20   Renato Shin, MD  mupirocin ointment (BACTROBAN) 2 % Apply 1 application topically  daily. 08/13/20   Jaymin Waln, Derry Skill, PA-C  Omega-3 Fatty Acids (FISH OIL PO) Take 1,000 mg by mouth once a week.    [provider]  [facility] ULTRA test strip USE AS INSTRUCTED TO TEST BLOOD SUGARS 3 OR 4 TIMES DAILY 05/22/19   Tonia Ghent, MD  rosuvastatin (CRESTOR) 20 MG tablet Take 1 tablet (20 mg total) by mouth daily. 03/23/20 06/21/20  Wellington Hampshire, MD  SURE COMFORT PEN NEEDLES 32G X 4 MM MISC USE THREE TIMES A DAY 10/31/19   Renato Shin, MD  traMADol (ULTRAM) 50 MG tablet TAKE 1 TABLET EVERY 8 HOURS AS NEEDED FOR CHRONIC JOINT/BACK PAIN (SEDATION CAUTION) 09/01/20   Tonia Ghent, MD    Family History Family History  Problem Relation Age of Onset   Diabetes Mother    Hypertension Mother    Stroke Mother    Diabetes Sister    Diabetes  Brother    Diabetes Brother    Heart disease Brother    Diabetes Father    Breast cancer Maternal Grandmother        breast   Lung disease Paternal Grandfather        black lung   Arthritis Neg Hx    Prostate cancer Neg Hx    Colon cancer Neg Hx    Stomach cancer Neg Hx     Social History Social History   Tobacco Use   Smoking status: Former    Packs/day: 1.00    Years: 41.00    Pack years: 41.00    Types: Cigarettes    Quit date: 2014    Years since quitting: 8.7   Smokeless tobacco: Never  Vaping Use   Vaping Use: Never used  Substance Use Topics   Alcohol use: No    Alcohol/week: 0.0 standard drinks   Drug use: No     Allergies   Patient has no known allergies.   Review of Systems Review of Systems  Constitutional:  Positive for activity change. Negative for appetite change, fatigue and fever.  Respiratory:  Negative for cough and shortness of breath.   Cardiovascular:  Negative for chest pain, palpitations and leg swelling.  Gastrointestinal:  Negative for abdominal pain, diarrhea, nausea and vomiting.  Musculoskeletal:  Positive for arthralgias. Negative for myalgias.  Neurological:  Negative for  dizziness, light-headedness and headaches.    Physical Exam Triage Vital Signs ED Triage Vitals  Enc Vitals Group     BP 11/06/20 1338 (!) 156/86     Pulse Rate 11/06/20 1338 (!) 120     Resp 11/06/20 1338 19     Temp 11/06/20 1339 97.9 F (36.6 C)     Temp Source 11/06/20 1339 Temporal     SpO2 11/06/20 1338 99 %     Weight --      Height --      Head Circumference --      Peak Flow --      Pain Score 11/06/20 1338 8     Pain Loc --      Pain Edu? --      Excl. in [place]? --    No data found.  Updated Vital Signs BP (!) 148/74   Pulse (!) 105   Temp 97.9 F (36.6 C) (Temporal)   Resp 19   SpO2 97%   Visual Acuity Right Eye Distance:   Left Eye Distance:   Bilateral Distance:    Right Eye Near:   Left Eye Near:    Bilateral Near:     Physical Exam Vitals reviewed.  Constitutional:      General: He is awake.     Appearance: Normal appearance. He is well-developed. He is not ill-appearing.     Comments: Very pleasant male appears stated age no acute distress sitting comfortably in exam room  HENT:     Head: Normocephalic and atraumatic.  Cardiovascular:     Rate and Rhythm: Regular rhythm. Tachycardia present.     Heart sounds: Normal heart sounds, S1 normal and S2 normal. No murmur heard. Pulmonary:     Effort: Pulmonary effort is normal.     Breath sounds: Normal breath sounds. No stridor. No wheezing, rhonchi or rales.     Comments: Clear to auscultation bilaterally Chest:     Chest wall: Tenderness present. No deformity or swelling.     Comments: Tenderness palpation over anterior chest wall; pain is similar but  not exactly reproducible on exam. Abdominal:     Palpations: Abdomen is soft.     Tenderness: There is no abdominal tenderness.  Musculoskeletal:     Left shoulder: Tenderness and bony tenderness present. No swelling. Decreased range of motion. Normal strength.     Comments: Left shoulder: Tenderness palpation along left AC joint.  Decreased  range of motion with overhead flexion, internal rotation, external rotation.  Patient unable to perform Apley scratch. Strength 5/5 bilateral upper extremities.  Normal pincer grip strength.  Neurological:     Mental Status: He is alert.  Psychiatric:        Behavior: Behavior is cooperative.     UC Treatments / Results  Labs (all labs ordered are listed, but only abnormal results are displayed) Labs Reviewed - No data to display  EKG   Radiology DG Shoulder Left  Result Date: 11/06/2020 CLINICAL DATA:  Left shoulder pain EXAM: LEFT SHOULDER - 2+ VIEW COMPARISON:  None. FINDINGS: No fracture. No glenohumeral dislocation. No evidence of acromioclavicular separation. No suspicious focal osseous lesions. No significant arthropathy. No pathologic soft tissue calcifications. Partially visualize cerclage wire and screw fragments overlying the lower cervical spine. IMPRESSION: No left shoulder fracture or malalignment. Electronically Signed   By: Ilona Sorrel M.D.   On: 11/06/2020 14:52    Procedures Procedures (including critical care time)  Medications Ordered in UC Medications - No data to display  Initial Impression / Assessment and Plan / UC Course  I have reviewed the triage vital signs and the nursing notes.  Pertinent labs & imaging results that were available during my care of the patient were reviewed by me and considered in my medical decision making (see chart for details).      Suspect musculoskeletal etiology given clinical presentation, however, as pain is not exactly reproducible on exam and given left-sided anterior chest pain with distribution into shoulder and neck EKG was obtained.  EKG showed normal sinus rhythm with ventricular rate of 99 bpm and nonspecific ST changes in aVL and T wave inversion in V1 compared to 09/28/2020 tracing with no ischemic changes.  Discussed with patient that given history of cardiovascular disease with EKG changes safely need to be to go  the emergency room.  He declined this as he believes symptoms are muscular in nature.  Recommended that he call his cardiologist immediately on Monday to schedule an appointment soon as possible but that if he has any worsening symptoms he must go to the ER to which she expressed understanding.  X-ray of left shoulder showed no acute abnormalities.  We are limited in treatment options given current medicines and cardiovascular history; unable to take NSAIDs due to Plavix cannot take steroids due to history of diabetes.  Cyclobenzaprine has been ineffective so we will change to tizanidine.  Discussed that this can be sedating so he should not drive or drink alcohol when taking this.  Recommended conservative treatment measures including heat, stretch, topical medications.  Discussed alarm symptoms that would warrant emergent evaluation.  Strict return precautions given to which patient expressed understanding.  Final Clinical Impressions(s) / UC Diagnoses   Final diagnoses:  Acute pain of left shoulder  Neck pain  Tachycardia     Discharge Instructions      I believe that you have a muscle strain but cannot rule out a cardiovascular event.  As we discussed, the safest thing to do would be to go to the emergency room.  If any point have worsening  symptoms including more persistent pain, shortness of breath, nausea, vomiting, lightheadedness, dizziness you need to go to the ER.  Please stop cyclobenzaprine and started tizanidine.  This make you sleepy do not drive or drink alcohol while taking it.  I recommend you use heat, rest, stretch, topical medications for symptom relief.  As we discussed, your EKG had a few changes so I do recommend that you follow-up with your cardiologist as soon as possible if you do not need to go to the emergency room over the weekend.  Follow-up with your PCP next week to consider referral to physical therapy and/or orthopedics.     ED Prescriptions     Medication Sig  Dispense Auth. Provider   tiZANidine (ZANAFLEX) 4 MG capsule Take 1 capsule (4 mg total) by mouth 2 (two) times daily as needed for muscle spasms. 20 capsule Cheyanna Strick, Derry Skill, PA-C      PDMP not reviewed this encounter.   Terrilee Croak, PA-C 11/06/20 1516

## 2020-11-08 ENCOUNTER — Other Ambulatory Visit: Payer: Self-pay | Admitting: Cardiovascular Disease

## 2020-11-11 DIAGNOSIS — E119 Type 2 diabetes mellitus without complications: Secondary | ICD-10-CM | POA: Diagnosis not present

## 2020-11-11 DIAGNOSIS — Z794 Long term (current) use of insulin: Secondary | ICD-10-CM | POA: Diagnosis not present

## 2020-11-17 ENCOUNTER — Other Ambulatory Visit: Payer: Self-pay | Admitting: Family Medicine

## 2020-11-22 ENCOUNTER — Other Ambulatory Visit: Payer: Self-pay

## 2020-11-22 ENCOUNTER — Ambulatory Visit (INDEPENDENT_AMBULATORY_CARE_PROVIDER_SITE_OTHER): Payer: Medicare Other | Admitting: Endocrinology

## 2020-11-22 ENCOUNTER — Encounter: Payer: Self-pay | Admitting: Endocrinology

## 2020-11-22 VITALS — BP 120/60 | HR 102 | Ht 70.0 in | Wt 209.0 lb

## 2020-11-22 DIAGNOSIS — E11649 Type 2 diabetes mellitus with hypoglycemia without coma: Secondary | ICD-10-CM | POA: Diagnosis not present

## 2020-11-22 LAB — POCT GLYCOSYLATED HEMOGLOBIN (HGB A1C): Hemoglobin A1C: 8.3 % — AB (ref 4.0–5.6)

## 2020-11-22 MED ORDER — NOVOLIN N FLEXPEN 100 UNIT/ML ~~LOC~~ SUPN: PEN_INJECTOR | SUBCUTANEOUS | 3 refills | Status: DC

## 2020-11-22 NOTE — Patient Instructions (Signed)
check your blood sugar 4 times a day.  vary the time of day when you check, between before the 3 meals, and at bedtime.  also check if you have symptoms of your blood sugar being too high or too low.  please keep a record of the readings and bring it to your next appointment here (or you can bring the meter itself).  You can write it on any piece of paper.  please call us sooner if your blood sugar goes below 70, or if you have a lot of readings over 200.   Please change the insulin to 170 units each morning, and 25 units each evening   However, if you are going to be active that day, take just 140 units that morning.   Please come back for a follow-up appointment in 3 months.

## 2020-11-22 NOTE — Progress Notes (Signed)
Subjective:    Patient ID: Anthony Cuevas, male    DOB: 1950-07-05, 70 y.o.   MRN: [id number]  HPI Pt returns for f/u of diabetes mellitus:  DM type: Insulin-requiring type 2 Dx'ed: 7829 Complications: CAD and PAD.   Therapy: insulin since 2006, and metformin.   DKA: never Severe hypoglycemia: never.   Pancreatitis: never.   Pancreatic imaging: never.   SDOH: he takes human insulin, due to cost.   Other: he stopped pioglitizone, due to edema; he takes BID insulin, after poor results with multiple daily injections.   Interval history: continuous glucose monitor data are reviewed.  glucose varies from 65-350.  It is in general highest at 11PM, but there is otherwise little trend throughout the day.  It is in general lowest 3AM-7AM, and 2PM-4PM.  He has mild hypoglycemia approx QOD. Pt says he never misses the insulin.   Past Medical History:  Diagnosis Date   Arthritis    Coronary atherosclerosis    a. 07/2018 CT Chest: 3 vessel coronary atherosclerosis; b. 07/2018 MV: EF 46% (GI uptake artifact), No ischemia/infarct. Mild to mod diffuse Ao atherosclerosis and at least moderate 2 vessel CAD (LAD/RCA) on CT imaging. Low risk.   Depression    improved on sertraline   Diabetes mellitus, type 2 ([place]) 09/1996   GERD (gastroesophageal reflux disease) 1990   History of peptic ulcer disease    Hyperlipidemia 1992   Hypertension 05/2003   Lumbar stenosis L2-3   Muscle atrophy    in arms from degenerative changes in neck   Neck pain    chronic   Smoker    Wears glasses     Past Surgical History:  Procedure Laterality Date   ABDOMINAL AORTOGRAM W/LOWER EXTREMITY N/A 09/03/2019   Procedure: ABDOMINAL AORTOGRAM W/LOWER EXTREMITY;  Surgeon: Wellington Hampshire, MD;  Location: [facility];  Service: Cardiovascular;  Laterality: N/A;  Limited Study   CERVICAL DISC SURGERY  04/19/02   fusion, Dr. Lorin Mercy   CERVICAL DISCECTOMY  1997   partial   CERVICAL DISCECTOMY  2000   COLONOSCOPY WITH  ESOPHAGOGASTRODUODENOSCOPY (EGD)  06/25/2015   HEMORRHOID SURGERY  1989   KNEE ARTHROPLASTY  11/20/2011   Procedure: COMPUTER ASSISTED TOTAL KNEE ARTHROPLASTY;  Surgeon: Marybelle Killings, MD;  Location: [facility] [facility];  Service: Orthopedics;  Laterality: Left;  Conversion Left Knee Medial Uni to Total Knee Arthroplasty-Cemented   LUMBAR LAMINECTOMY/DECOMPRESSION MICRODISCECTOMY N/A 04/28/2015   Procedure: L2-3 Decompression;  Surgeon: Marybelle Killings, MD;  Location: [place];  Service: Orthopedics;  Laterality: N/A;   MR MRA DUPLICATE EXAM  INACTIVATE     MULTIPLE TOOTH EXTRACTIONS     Neisen funduplication  5621   PERIPHERAL VASCULAR INTERVENTION  09/03/2019   Procedure: PERIPHERAL VASCULAR INTERVENTION;  Surgeon: Wellington Hampshire, MD;  Location: [facility];  Service: Cardiovascular;;  Iliac Stent   REPLACEMENT TOTAL KNEE  02/14/02, 05/04/02   left- partial   SCC EXCISION  04/25/01   Dr. March Rummage   STOMACH SURGERY  1984   PUD, HH    Social History   Socioeconomic History   Marital status: Married    Spouse name: Not on file   Number of children: 3   Years of education: Not on file   Highest education level: Not on file  Occupational History   Occupation: Training and development officer, retired    Comment: Scientific laboratory technician  Tobacco Use   Smoking status: Former    Packs/day: 1.00  Years: 41.00    Pack years: 41.00    Types: Cigarettes    Quit date: 2014    Years since quitting: 8.8   Smokeless tobacco: Never  Vaping Use   Vaping Use: Never used  Substance and Sexual Activity   Alcohol use: No    Alcohol/week: 0.0 standard drinks   Drug use: No   Sexual activity: Never  Other Topics Concern   Not on file  Social History Narrative   Retired Education officer, museum, [employer], aviation fuel, [phone], no known agent orange exposure but 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   Married 1975   3 kids   Social Determinants of Health   Financial Resource Strain: Low Risk    Difficulty of Paying Living Expenses: Not  hard at all  Food Insecurity: No Food Insecurity   Worried About Charity fundraiser in the Last Year: Never true   Arboriculturist in the Last Year: Never true  Transportation Needs: No Transportation Needs   Lack of Transportation (Medical): No   Lack of Transportation (Non-Medical): No  Physical Activity: Insufficiently Active   Days of Exercise per Week: 2 days   Minutes of Exercise per Session: 30 min  Stress: No Stress Concern Present   Feeling of Stress : Not at all  Social Connections: Not on file  Intimate Partner Violence: Not At Risk   Fear of Current or Ex-Partner: No   Emotionally Abused: No   Physically Abused: No   Sexually Abused: No    Current Outpatient Medications on File Prior to Visit  Medication Sig Dispense Refill   Cholecalciferol (VITAMIN D3) 125 MCG (5000 UT) CAPS Take 5,000 Units by mouth 2 (two) times a week.     clopidogrel (PLAVIX) 75 MG tablet TAKE 1 TABLET DAILY 90 tablet 2   Continuous Blood Gluc Sensor (FREESTYLE LIBRE 14 DAY SENSOR) MISC 1 Device by Does not apply route every 14 (fourteen) days. 6 each 3   cyanocobalamin 1000 MCG tablet Take 1,000 mcg by mouth daily.     latanoprost (XALATAN) 0.005 % ophthalmic solution 1 drop at bedtime.     lisinopril (ZESTRIL) 40 MG tablet TAKE 1 TABLET DAILY 90 tablet 3   metFORMIN (GLUCOPHAGE) 1000 MG tablet TAKE 1 TABLET TWICE A DAY WITH MEALS 180 tablet 3   mupirocin ointment (BACTROBAN) 2 % Apply 1 application topically daily. 22 g 0   Omega-3 Fatty Acids (FISH OIL PO) Take 1,000 mg by mouth once a week.     ONETOUCH ULTRA test strip USE AS INSTRUCTED TO TEST BLOOD SUGARS 3 OR 4 TIMES DAILY 100 each 11   SURE COMFORT PEN NEEDLES 32G X 4 MM MISC USE THREE TIMES A DAY 100 each 10   tiZANidine (ZANAFLEX) 4 MG capsule Take 1 capsule (4 mg total) by mouth 2 (two) times daily as needed for muscle spasms. 20 capsule 0   traMADol (ULTRAM) 50 MG tablet TAKE 1 TABLET EVERY 8 HOURS AS NEEDED FOR CHRONIC JOINT/BACK  PAIN (SEDATION CAUTION) 270 tablet 0   rosuvastatin (CRESTOR) 20 MG tablet Take 1 tablet (20 mg total) by mouth daily. 90 tablet 3   No current facility-administered medications on file prior to visit.    No Known Allergies  Family History  Problem Relation Age of Onset   Diabetes Mother    Hypertension Mother    Stroke Mother    Diabetes Sister    Diabetes Brother    Diabetes Brother  Heart disease Brother    Diabetes Father    Breast cancer Maternal Grandmother        breast   Lung disease Paternal Grandfather        black lung   Arthritis Neg Hx    Prostate cancer Neg Hx    Colon cancer Neg Hx    Stomach cancer Neg Hx     BP 120/60 (BP Location: Right Arm, Patient Position: Sitting, Cuff Size: Large)   Pulse (!) 102   Ht 5\' 10"  (1.778 m)   Wt 209 lb (94.8 kg)   SpO2 98%   BMI 29.99 kg/m    Review of Systems     Objective:   Physical Exam Pulses: dorsalis pedis intact bilat.   MSK: no deformity of the feet CV: trace bilat leg edema Skin:  no ulcer on the feet.  normal color and temp on the feet. Neuro: sensation is intact to touch on the feet, but decreased from normal.  Ext: there is bilateral onychomycosis of the toenails.    Lab Results  Component Value Date   CREATININE 1.20 12/05/2019   BUN 18 12/05/2019   NA 141 12/05/2019   K 5.0 12/05/2019   CL 102 12/05/2019   CO2 28 12/05/2019    Lab Results  Component Value Date   HGBA1C 8.3 (A) 11/22/2020      Assessment & Plan:  Insulin-requiring type 2 DM: uncontrolled Hypoglycemia, due to insulin: we'll have to adjust insulin just slightly.   Patient Instructions  check your blood sugar 4 times a day.  vary the time of day when you check, between before the 3 meals, and at bedtime.  also check if you have symptoms of your blood sugar being too high or too low.  please keep a record of the readings and bring it to your next appointment here (or you can bring the meter itself).  You can write it on  any piece of paper.  please call us sooner if your blood sugar goes below 70, or if you have a lot of readings over 200.   Please change the insulin to 170 units each morning, and 25 units each evening   However, if you are going to be active that day, take just 140 units that morning.   Please come back for a follow-up appointment in 3 months.

## 2020-12-02 ENCOUNTER — Telehealth: Payer: Self-pay | Admitting: Family Medicine

## 2020-12-03 NOTE — Telephone Encounter (Signed)
Called Mr. Woodard and got him scheduled for 12/5

## 2020-12-03 NOTE — Telephone Encounter (Signed)
Patient needs AWV scheduled; please call to schedule.

## 2020-12-03 NOTE — Telephone Encounter (Signed)
Sent. Thanks.   

## 2020-12-03 NOTE — Telephone Encounter (Signed)
Refill request for TRAMADOL HCL TABS 50MG   LOV - 12/11/19 Next OV - not scheduled yet but has been sent to scheduling to be scheduled Last refill - 09/01/20 #[id number]/0

## 2020-12-05 NOTE — Progress Notes (Signed)
Subjective:   Anthony Cuevas is a 70 y.o. male who presents for Medicare Annual/Subsequent preventive examination.  I connected with Willette Cluster today by telephone and verified that I am speaking with the correct person using two identifiers. Location patient: home Location provider: work Persons participating in the virtual visit: patient, Marine scientist.    I discussed the limitations, risks, security and privacy concerns of performing an evaluation and management service by telephone and the availability of in person appointments. I also discussed with the patient that there may be a patient responsible charge related to this service. The patient expressed understanding and verbally consented to this telephonic visit.    Interactive audio and video telecommunications were attempted between this provider and patient, however failed, due to patient having technical difficulties OR patient did not have access to video capability.  We continued and completed visit with audio only.  Some vital signs may be absent or patient reported.   Time Spent with patient on telephone encounter: 25 minutes  Review of Systems     Cardiac Risk Factors include: advanced age (>64men, >52 women);diabetes mellitus     Objective:    Today's Vitals   12/07/20 1440  Weight: 209 lb (94.8 kg)  Height: 5\' 10"  (1.778 m)   Body mass index is 29.99 kg/m.  Advanced Directives 12/07/2020 12/05/2019 09/03/2019 12/04/2018 11/26/2017 11/23/2016 08/06/2015  Does Patient Have a Medical Advance Directive? No No No No No No No  Would patient like information on creating a medical advance directive? Yes (MAU/Ambulatory/Procedural Areas - Information given) Yes (MAU/Ambulatory/Procedural Areas - Information given) No - Patient declined No - Patient declined No - Patient declined Yes (MAU/Ambulatory/Procedural Areas - Information given) Yes - Educational materials given  Pre-existing out of facility DNR order (yellow form or pink  MOST form) - - - - - - -    Current Medications (verified) Outpatient Encounter Medications as of 12/07/2020  Medication Sig   Cholecalciferol (VITAMIN D3) 125 MCG (5000 UT) CAPS Take 5,000 Units by mouth 2 (two) times a week.   clopidogrel (PLAVIX) 75 MG tablet TAKE 1 TABLET DAILY   Continuous Blood Gluc Sensor (FREESTYLE LIBRE 14 DAY SENSOR) MISC 1 Device by Does not apply route every 14 (fourteen) days.   cyanocobalamin 1000 MCG tablet Take 1,000 mcg by mouth daily.   Insulin NPH, Human,, Isophane, (NOVOLIN N FLEXPEN) 100 UNIT/ML Kiwkpen 170 units each morning, and 25 units each evening, and pen needles 3/day.   latanoprost (XALATAN) 0.005 % ophthalmic solution 1 drop at bedtime.   lisinopril (ZESTRIL) 40 MG tablet TAKE 1 TABLET DAILY   metFORMIN (GLUCOPHAGE) 1000 MG tablet TAKE 1 TABLET TWICE A DAY WITH MEALS   mupirocin ointment (BACTROBAN) 2 % Apply 1 application topically daily.   Omega-3 Fatty Acids (FISH OIL PO) Take 1,000 mg by mouth once a week.   ONETOUCH ULTRA test strip USE AS INSTRUCTED TO TEST BLOOD SUGARS 3 OR 4 TIMES DAILY   SURE COMFORT PEN NEEDLES 32G X 4 MM MISC USE THREE TIMES A DAY   tiZANidine (ZANAFLEX) 4 MG capsule Take 1 capsule (4 mg total) by mouth 2 (two) times daily as needed for muscle spasms.   traMADol (ULTRAM) 50 MG tablet TAKE 1 TABLET EVERY 8 HOURS AS NEEDED FOR CHRONIC JOINT/BACK PAIN (SEDATION CAUTION)   rosuvastatin (CRESTOR) 20 MG tablet Take 1 tablet (20 mg total) by mouth daily.   No facility-administered encounter medications on file as of 12/07/2020.    Allergies (  verified) Patient has no known allergies.   History: Past Medical History:  Diagnosis Date   Arthritis    Coronary atherosclerosis    a. 07/2018 CT Chest: 3 vessel coronary atherosclerosis; b. 07/2018 MV: EF 46% (GI uptake artifact), No ischemia/infarct. Mild to mod diffuse Ao atherosclerosis and at least moderate 2 vessel CAD (LAD/RCA) on CT imaging. Low risk.   Depression     improved on sertraline   Diabetes mellitus, type 2 ([place]) 09/1996   GERD (gastroesophageal reflux disease) 1990   History of peptic ulcer disease    Hyperlipidemia 1992   Hypertension 05/2003   Lumbar stenosis L2-3   Muscle atrophy    in arms from degenerative changes in neck   Neck pain    chronic   Smoker    Wears glasses    Past Surgical History:  Procedure Laterality Date   ABDOMINAL AORTOGRAM W/LOWER EXTREMITY N/A 09/03/2019   Procedure: ABDOMINAL AORTOGRAM W/LOWER EXTREMITY;  Surgeon: Wellington Hampshire, MD;  Location: [facility];  Service: Cardiovascular;  Laterality: N/A;  Limited Study   CERVICAL DISC SURGERY  04/19/02   fusion, Dr. Lorin Mercy   CERVICAL DISCECTOMY  1997   partial   CERVICAL DISCECTOMY  2000   COLONOSCOPY WITH ESOPHAGOGASTRODUODENOSCOPY (EGD)  06/25/2015   HEMORRHOID SURGERY  1989   KNEE ARTHROPLASTY  11/20/2011   Procedure: COMPUTER ASSISTED TOTAL KNEE ARTHROPLASTY;  Surgeon: Marybelle Killings, MD;  Location: [facility];  Service: Orthopedics;  Laterality: Left;  Conversion Left Knee Medial Uni to Total Knee Arthroplasty-Cemented   LUMBAR LAMINECTOMY/DECOMPRESSION MICRODISCECTOMY N/A 04/28/2015   Procedure: L2-3 Decompression;  Surgeon: Marybelle Killings, MD;  Location: [facility];  Service: Orthopedics;  Laterality: N/A;   MR MRA DUPLICATE EXAM  INACTIVATE     MULTIPLE TOOTH EXTRACTIONS     Neisen funduplication  6948   PERIPHERAL VASCULAR INTERVENTION  09/03/2019   Procedure: PERIPHERAL VASCULAR INTERVENTION;  Surgeon: Wellington Hampshire, MD;  Location: [facility];  Service: Cardiovascular;;  Iliac Stent   REPLACEMENT TOTAL KNEE  02/14/02, 05/04/02   left- partial   SCC EXCISION  04/25/01   Dr. March Rummage   STOMACH SURGERY  1984   PUD, [place]   Family History  Problem Relation Age of Onset   Diabetes Mother    Hypertension Mother    Stroke Mother    Diabetes Sister    Diabetes Brother    Diabetes Brother    Heart disease Brother    Diabetes Father    Breast cancer  Maternal Grandmother        breast   Lung disease Paternal Grandfather        black lung   Arthritis Neg Hx    Prostate cancer Neg Hx    Colon cancer Neg Hx    Stomach cancer Neg Hx    Social History   Socioeconomic History   Marital status: Married    Spouse name: Not on file   Number of children: 3   Years of education: Not on file   Highest education level: Not on file  Occupational History   Occupation: Training and development officer, retired    Comment: Scientific laboratory technician  Tobacco Use   Smoking status: Former    Packs/day: 1.00    Years: 41.00    Pack years: 41.00    Types: Cigarettes    Quit date: 2014    Years since quitting: 8.8   Smokeless tobacco: Never  Vaping Use   Vaping  Use: Never used  Substance and Sexual Activity   Alcohol use: No    Alcohol/week: 0.0 standard drinks   Drug use: No   Sexual activity: Never  Other Topics Concern   Not on file  Social History Narrative   Retired Education officer, museum, [employer], aviation fuel, [phone], no known agent orange exposure but 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   Married 1975   3 kids   Social Determinants of Health   Financial Resource Strain: Low Risk    Difficulty of Paying Living Expenses: Not hard at all  Food Insecurity: No Food Insecurity   Worried About Charity fundraiser in the Last Year: Never true   Arboriculturist in the Last Year: Never true  Transportation Needs: No Transportation Needs   Lack of Transportation (Medical): No   Lack of Transportation (Non-Medical): No  Physical Activity: Insufficiently Active   Days of Exercise per Week: 7 days   Minutes of Exercise per Session: 10 min  Stress: No Stress Concern Present   Feeling of Stress : Not at all  Social Connections: Moderately Isolated   Frequency of Communication with Friends and Family: More than three times a week   Frequency of Social Gatherings with Friends and Family: Once a week   Attends Religious Services: Never   Marine scientist or  Organizations: No   Attends Music therapist: Never   Marital Status: Married    Tobacco Counseling Counseling given: Not Answered   Clinical Intake:  Pre-visit preparation completed: Yes  Pain : No/denies pain     BMI - recorded: 29.99 Nutritional Status: BMI 25 -29 Overweight Nutritional Risks: None Diabetes: Yes CBG done?: No (visit completed over the phone) Did pt. bring in CBG monitor from home?: No  How often do you need to have someone help you when you read instructions, pamphlets, or other written materials from your doctor or pharmacy?: 1 - Never  Diabetes:  Is the patient diabetic?  Yes If diabetic, was a CBG obtained today?  No , visit completed over the phone. Did the patient bring in their glucometer from home?  No , visit completed over the phone. How often do you monitor your CBG's? 4-5 times per day.   Financial Strains and Diabetes Management:  Are you having any financial strains with the device, your supplies or your medication? No .  Does the patient want to be seen by Chronic Care Management for management of their diabetes?  No  Would the patient like to be referred to a Nutritionist or for Diabetic Management?  No   Diabetic Exams:  Diabetic Eye Exam: Completed 08/12/20.   Diabetic Foot Exam: Completed 11/22/20. Interpreter Needed?: No  Information entered by :: Orrin Brigham LPN   Activities of Daily Living In your present state of health, do you have any difficulty performing the following activities: 12/07/2020  Hearing? N  Vision? N  Difficulty concentrating or making decisions? N  Walking or climbing stairs? N  Dressing or bathing? N  Doing errands, shopping? N  Preparing Food and eating ? N  Using the Toilet? N  In the past six months, have you accidently leaked urine? N  Do you have problems with loss of bowel control? N  Managing your Medications? N  Managing your Finances? N  Housekeeping or managing your  Housekeeping? N  Some recent data might be hidden    Patient Care Team: Tonia Ghent, MD as  PCP - General (Family Medicine) Rockey Situ, Kathlene November, MD as PCP - Cardiology (Cardiology) Marybelle Killings, MD as Consulting Physician (Orthopedic Surgery) Zehr, Laban Emperor, PA-C as Physician Assistant (Gastroenterology) Gardiner Barefoot, DPM as Consulting Physician (Podiatry) Thelma Comp, [place] as Referring Physician (Optometry) Renato Shin, MD as Consulting Physician (Endocrinology)  Indicate any recent Medical Services you may have received from other than [facility] providers in the past year (date may be approximate).     Assessment:   This is a routine wellness examination for [place].  Hearing/Vision screen Hearing Screening - Comments:: No issues Vision Screening - Comments:: Last eye exam 08/12/20 @ [facility], wears glasses  Dietary issues and exercise activities discussed: Current Exercise Habits: Home exercise routine, Type of exercise: walking, Time (Minutes): 10, Frequency (Times/Week): 7, Weekly Exercise (Minutes/Week): 70, Intensity: Mild, Exercise limited by: cardiac condition(s)   Goals Addressed             This Visit's Progress    Patient Stated       Would like to decrease A1C.        Depression Screen PHQ 2/9 Scores 12/07/2020 12/05/2019 12/04/2018 11/26/2017 11/23/2016 08/06/2015 06/12/2013  PHQ - 2 Score 0 0 0 0 0 0 0  PHQ- 9 Score - 0 0 0 0 - -    Fall Risk Fall Risk  12/07/2020 12/05/2019 12/04/2018 11/26/2017 11/23/2016  Falls in the past year? 0 0 1 No No  Comment - - tripped coming in door - -  Number falls in past yr: 0 0 0 - -  Injury with Fall? 0 0 0 - -  Risk Factor Category  - - - - -  Risk for fall due to : No Fall Risks Medication side effect Medication side effect - -  Follow up Falls prevention discussed Falls evaluation completed;Falls prevention discussed Falls evaluation completed;Falls prevention discussed - -    FALL RISK PREVENTION  PERTAINING TO THE HOME:  Any stairs in or around the home? Yes  If so, are there any without handrails? No  Home free of loose throw rugs in walkways, pet beds, electrical cords, etc? Yes  Adequate lighting in your home to reduce risk of falls? Yes   ASSISTIVE DEVICES UTILIZED TO PREVENT FALLS:  Life alert? No  Use of a cane, walker or w/c? No  Grab bars in the bathroom? No  Shower chair or bench in shower? No  Elevated toilet seat or a handicapped toilet? No   TIMED UP AND GO:  Was the test performed? No , visit completed over the phone.   Cognitive Function: MMSE - Mini Mental State Exam 12/05/2019 12/04/2018 11/26/2017 11/23/2016 08/06/2015  Orientation to time 5 5 5 5 5   Orientation to Place 5 5 5 5 5   Registration 3 3 3 3 3   Attention/ Calculation 5 4 0 0 0  Recall 3 3 3 2 3   Recall-comments - - - unable to recall 1 of 3 words -  Language- name 2 objects - - 0 0 0  Language- repeat 1 1 1 1 1   Language- follow 3 step command - - 3 3 3   Language- read & follow direction - - 0 0 0  Write a sentence - - 0 0 0  Copy design - - 0 0 0  Total score - - 20 19 20         Immunizations Immunization History  Administered Date(s) Administered   Fluad Quad(high Dose 65+) 10/02/2018, 10/16/2019  H1N1 01/30/2008   Influenza Split 12/08/2010, 10/20/2011   Influenza Whole 12/03/2008   Influenza,inj,Quad PF,6+ Mos 12/06/2012, 12/15/2013, 11/17/2014, 11/16/2015, 11/23/2016, 11/26/2017   PFIZER(Purple Top)SARS-COV-2 Vaccination 03/30/2019, 04/21/2019, 10/29/2019   Pneumococcal Conjugate-13 11/26/2017   Pneumococcal Polysaccharide-23 06/12/2013, 12/06/2018   Td 09/06/2001   Tdap 12/07/2011, 08/13/2020    TDAP status: Up to date  Flu Vaccine status: Due, Education has been provided regarding the importance of this vaccine. Advised may receive this vaccine at local pharmacy or Health Dept. Aware to provide a copy of the vaccination record if obtained from local pharmacy or Health  Dept. Verbalized acceptance and understanding.  Pneumococcal vaccine status: Up to date  Covid-19 vaccine status: Declined, Education has been provided regarding the importance of this vaccine but patient still declined. Advised may receive this vaccine at local pharmacy or Health Dept.or vaccine clinic. Aware to provide a copy of the vaccination record if obtained from local pharmacy or Health Dept. Verbalized acceptance and understanding.  Qualifies for Shingles Vaccine? Yes   Zostavax completed No   Shingrix Completed?: No.    Education has been provided regarding the importance of this vaccine. Patient has been advised to call insurance company to determine out of pocket expense if they have not yet received this vaccine. Advised may also receive vaccine at local pharmacy or Health Dept. Verbalized acceptance and understanding.  Screening Tests Health Maintenance  Topic Date Due   Zoster Vaccines- Shingrix (1 of 2) Never done   COVID-19 Vaccine (4 - Booster for Pfizer series) 12/24/2019   COLONOSCOPY (Pts 45-25yrs Insurance coverage will need to be confirmed)  06/24/2020   INFLUENZA VACCINE  08/30/2020   HEMOGLOBIN A1C  05/23/2021   OPHTHALMOLOGY EXAM  08/12/2021   FOOT EXAM  11/22/2021   TETANUS/TDAP  08/14/2030   Pneumonia Vaccine 68+ Years old  Completed   Hepatitis C Screening  Completed   HPV VACCINES  Aged Out    Health Maintenance  Health Maintenance Due  Topic Date Due   Zoster Vaccines- Shingrix (1 of 2) Never done   COVID-19 Vaccine (4 - Booster for [place] series) 12/24/2019   COLONOSCOPY (Pts 45-65yrs Insurance coverage will need to be confirmed)  06/24/2020   INFLUENZA VACCINE  08/30/2020    Colorectal cancer screening: due, last exam 06/25/15, patient plans on discussing with PCP at next appointment.  Lung Cancer Screening: (Low Dose CT Chest recommended if Age 48-80 years, 30 pack-year currently smoking OR have quit w/in 15years.) does qualify.   Lung Cancer  Screening Referral: Patient plans on discussing screening with PCP.  Additional Screening:  Hepatitis C Screening: does qualify; Completed 02/12/15  Vision Screening: Recommended annual ophthalmology exams for early detection of glaucoma and other disorders of the eye. Is the patient up to date with their annual eye exam?  Yes  Who is the provider or what is the name of the office in which the patient attends annual eye exams? [facility]   Dental Screening: Recommended annual dental exams for proper oral hygiene  Community Resource Referral / Chronic Care Management: CRR required this visit?  No   CCM required this visit?  No      Plan:     I have personally reviewed and noted the following in the patient's chart:   Medical and social history Use of alcohol, tobacco or illicit drugs  Current medications and supplements including opioid prescriptions. Patient is not currently taking opioid prescriptions. Functional ability and status Nutritional status Physical activity Advanced  directives List of other physicians Hospitalizations, surgeries, and ER visits in previous 12 months Vitals Screenings to include cognitive, depression, and falls Referrals and appointments  In addition, I have reviewed and discussed with patient certain preventive protocols, quality metrics, and best practice recommendations. A written personalized care plan for preventive services as well as general preventive health recommendations were provided to patient.   Due to this being a telephonic visit, the after visit summary with patients personalized plan was offered to patient via mail or my-chart. Patient would like to access on my-chart.   Loma Messing, LPN   [id number]   Nurse Health Advisor  Nurse Notes: None

## 2020-12-07 ENCOUNTER — Ambulatory Visit (INDEPENDENT_AMBULATORY_CARE_PROVIDER_SITE_OTHER): Payer: Medicare Other

## 2020-12-07 VITALS — Ht 70.0 in | Wt 209.0 lb

## 2020-12-07 DIAGNOSIS — Z0000 Encounter for general adult medical examination without abnormal findings: Secondary | ICD-10-CM | POA: Diagnosis not present

## 2020-12-07 NOTE — Patient Instructions (Signed)
Anthony Cuevas , Thank you for taking time to complete your Medicare Wellness Visit. I appreciate your ongoing commitment to your health goals. Please review the following plan we discussed and let me know if I can assist you in the future.   Screening recommendations/referrals: Colonoscopy: Due, last exam 06/25/15, plan on discussing with PCP at your next appointment Recommended yearly ophthalmology/optometry visit for glaucoma screening and checkup Recommended yearly dental visit for hygiene and checkup  Vaccinations: Influenza vaccine: Due-May obtain vaccine at our office or your local pharmacy.  Pneumococcal vaccine: up to date Tdap vaccine: up to date , completed 08/13/20, Due 08/14/30 Shingles vaccine: Discuss with local your pharmacy Covid-19: newest booster available at your local pharmacy  Advanced directives: Please bring a copy of Living Will and/or [place] for your chart when ready.  Conditions/risks identified: see problem list  Next appointment: Follow up in one year for your annual wellness visit.   Preventive Care 69 Years and Older, Male Preventive care refers to lifestyle choices and visits with your health care provider that can promote health and wellness. What does preventive care include? A yearly physical exam. This is also called an annual well check. Dental exams once or twice a year. Routine eye exams. Ask your health care provider how often you should have your eyes checked. Personal lifestyle choices, including: Daily care of your teeth and gums. Regular physical activity. Eating a healthy diet. Avoiding tobacco and drug use. Limiting alcohol use. Practicing safe sex. Taking low doses of aspirin every day. Taking vitamin and mineral supplements as recommended by your health care provider. What happens during an annual well check? The services and screenings done by your health care provider during your annual well check will depend on  your age, overall health, lifestyle risk factors, and family history of disease. Counseling  Your health care provider may ask you questions about your: Alcohol use. Tobacco use. Drug use. Emotional well-being. Home and relationship well-being. Sexual activity. Eating habits. History of falls. Memory and ability to understand (cognition). Work and work Statistician. Screening  You may have the following tests or measurements: Height, weight, and BMI. Blood pressure. Lipid and cholesterol levels. These may be checked every 5 years, or more frequently if you are over 69 years old. Skin check. Lung cancer screening. You may have this screening every year starting at age 69 if you have a 30-pack-year history of smoking and currently smoke or have quit within the past 15 years. Fecal occult blood test (FOBT) of the stool. You may have this test every year starting at age 69. Flexible sigmoidoscopy or colonoscopy. You may have a sigmoidoscopy every 5 years or a colonoscopy every 10 years starting at age 69. Prostate cancer screening. Recommendations will vary depending on your family history and other risks. Hepatitis C blood test. Hepatitis B blood test. Sexually transmitted disease (STD) testing. Diabetes screening. This is done by checking your blood sugar (glucose) after you have not eaten for a while (fasting). You may have this done every 1-3 years. Abdominal aortic aneurysm (AAA) screening. You may need this if you are a current or former smoker. Osteoporosis. You may be screened starting at age 69 if you are at high risk. Talk with your health care provider about your test results, treatment options, and if necessary, the need for more tests. Vaccines  Your health care provider may recommend certain vaccines, such as: Influenza vaccine. This is recommended every year. Tetanus, diphtheria, and acellular pertussis (  Tdap, Td) vaccine. You may need a Td booster every 10 years. Zoster  vaccine. You may need this after age 69. Pneumococcal 13-valent conjugate (PCV13) vaccine. One dose is recommended after age 69. Pneumococcal polysaccharide (PPSV23) vaccine. One dose is recommended after age 69. Talk to your health care provider about which screenings and vaccines you need and how often you need them. This information is not intended to replace advice given to you by your health care provider. Make sure you discuss any questions you have with your health care provider. Document Released: 02/12/2015 Document Revised: 10/06/2015 Document Reviewed: 11/17/2014 Elsevier Interactive Patient Education  2017 [employer] Prevention in the Home Falls can cause injuries. They can happen to people of all ages. There are many things you can do to make your home safe and to help prevent falls. What can I do on the outside of my home? Regularly fix the edges of walkways and driveways and fix any cracks. Remove anything that might make you trip as you walk through a door, such as a raised step or threshold. Trim any bushes or trees on the path to your home. Use bright outdoor lighting. Clear any walking paths of anything that might make someone trip, such as rocks or tools. Regularly check to see if handrails are loose or broken. Make sure that both sides of any steps have handrails. Any raised decks and porches should have guardrails on the edges. Have any leaves, snow, or ice cleared regularly. Use sand or salt on walking paths during winter. Clean up any spills in your garage right away. This includes oil or grease spills. What can I do in the bathroom? Use night lights. Install grab bars by the toilet and in the tub and shower. Do not use towel bars as grab bars. Use non-skid mats or decals in the tub or shower. If you need to sit down in the shower, use a plastic, non-slip stool. Keep the floor dry. Clean up any water that spills on the floor as soon as it happens. Remove  soap buildup in the tub or shower regularly. Attach bath mats securely with double-sided non-slip rug tape. Do not have throw rugs and other things on the floor that can make you trip. What can I do in the bedroom? Use night lights. Make sure that you have a light by your bed that is easy to reach. Do not use any sheets or blankets that are too big for your bed. They should not hang down onto the floor. Have a firm chair that has side arms. You can use this for support while you get dressed. Do not have throw rugs and other things on the floor that can make you trip. What can I do in the kitchen? Clean up any spills right away. Avoid walking on wet floors. Keep items that you use a lot in easy-to-reach places. If you need to reach something above you, use a strong step stool that has a grab bar. Keep electrical cords out of the way. Do not use floor polish or wax that makes floors slippery. If you must use wax, use non-skid floor wax. Do not have throw rugs and other things on the floor that can make you trip. What can I do with my stairs? Do not leave any items on the stairs. Make sure that there are handrails on both sides of the stairs and use them. Fix handrails that are broken or loose. Make sure that handrails are  as long as the stairways. Check any carpeting to make sure that it is firmly attached to the stairs. Fix any carpet that is loose or worn. Avoid having throw rugs at the top or bottom of the stairs. If you do have throw rugs, attach them to the floor with carpet tape. Make sure that you have a light switch at the top of the stairs and the bottom of the stairs. If you do not have them, ask someone to add them for you. What else can I do to help prevent falls? Wear shoes that: Do not have high heels. Have rubber bottoms. Are comfortable and fit you well. Are closed at the toe. Do not wear sandals. If you use a stepladder: Make sure that it is fully opened. Do not climb a  closed stepladder. Make sure that both sides of the stepladder are locked into place. Ask someone to hold it for you, if possible. Clearly mark and make sure that you can see: Any grab bars or handrails. First and last steps. Where the edge of each step is. Use tools that help you move around (mobility aids) if they are needed. These include: Canes. Walkers. Scooters. Crutches. Turn on the lights when you go into a dark area. Replace any light bulbs as soon as they burn out. Set up your furniture so you have a clear path. Avoid moving your furniture around. If any of your floors are uneven, fix them. If there are any pets around you, be aware of where they are. Review your medicines with your doctor. Some medicines can make you feel dizzy. This can increase your chance of falling. Ask your doctor what other things that you can do to help prevent falls. This information is not intended to replace advice given to you by your health care provider. Make sure you discuss any questions you have with your health care provider. Document Released: 11/12/2008 Document Revised: 06/24/2015 Document Reviewed: 02/20/2014 Elsevier Interactive Patient Education  2017 [employer].

## 2020-12-10 ENCOUNTER — Ambulatory Visit (INDEPENDENT_AMBULATORY_CARE_PROVIDER_SITE_OTHER): Payer: Medicare Other | Admitting: Podiatry

## 2020-12-10 ENCOUNTER — Encounter: Payer: Self-pay | Admitting: Podiatry

## 2020-12-10 ENCOUNTER — Other Ambulatory Visit: Payer: Self-pay

## 2020-12-10 DIAGNOSIS — E119 Type 2 diabetes mellitus without complications: Secondary | ICD-10-CM | POA: Diagnosis not present

## 2020-12-10 DIAGNOSIS — B351 Tinea unguium: Secondary | ICD-10-CM

## 2020-12-10 DIAGNOSIS — M2010 Hallux valgus (acquired), unspecified foot: Secondary | ICD-10-CM

## 2020-12-10 DIAGNOSIS — M79676 Pain in unspecified toe(s): Secondary | ICD-10-CM

## 2020-12-10 NOTE — Progress Notes (Signed)
This patient returns to my office for at risk foot care.  This patient requires this care by a professional since this patient will be at risk due to having type 2 diabetes.  This patient is unable to cut nails himself since the patient cannot reach his nails.These nails are painful walking and wearing shoes.  This patient presents for at risk foot care today.  General Appearance  Alert, conversant and in no acute stress.  Vascular  Dorsalis pedis and posterior tibial  pulses are palpable  bilaterally.  Capillary return is within normal limits  bilaterally. Temperature is within normal limits  bilaterally.  Neurologic  Senn-Weinstein monofilament wire test diminished  bilaterally. Muscle power within normal limits bilaterally.  Nails Thick disfigured discolored nails with subungual debris  from hallux to fifth toes bilaterally. No evidence of bacterial infection or drainage bilaterally.  Orthopedic  No limitations of motion  feet .  No crepitus or effusions noted.  No bony pathology or digital deformities noted.  HAV  B/L.  Skin  normotropic skin with no porokeratosis noted bilaterally.  No signs of infections or ulcers noted.     Onychomycosis  Pain in right toes  Pain in left toes  Consent was obtained for treatment procedures.   Mechanical debridement of nails 1-5  bilaterally performed with a nail nipper.  Filed with dremel without incident.    Return office visit    12  weeks                  Told patient to return for periodic foot care and evaluation due to potential at risk complications.   Zayanna Pundt DPM  

## 2020-12-11 DIAGNOSIS — E119 Type 2 diabetes mellitus without complications: Secondary | ICD-10-CM | POA: Diagnosis not present

## 2020-12-11 DIAGNOSIS — Z794 Long term (current) use of insulin: Secondary | ICD-10-CM | POA: Diagnosis not present

## 2020-12-13 DIAGNOSIS — H401131 Primary open-angle glaucoma, bilateral, mild stage: Secondary | ICD-10-CM | POA: Diagnosis not present

## 2021-01-03 ENCOUNTER — Ambulatory Visit (INDEPENDENT_AMBULATORY_CARE_PROVIDER_SITE_OTHER): Payer: Medicare Other | Admitting: Family Medicine

## 2021-01-03 ENCOUNTER — Encounter: Payer: Self-pay | Admitting: Family Medicine

## 2021-01-03 ENCOUNTER — Other Ambulatory Visit: Payer: Self-pay

## 2021-01-03 VITALS — BP 122/70 | HR 95 | Temp 97.7°F | Ht 70.0 in | Wt 211.0 lb

## 2021-01-03 DIAGNOSIS — E11649 Type 2 diabetes mellitus with hypoglycemia without coma: Secondary | ICD-10-CM

## 2021-01-03 DIAGNOSIS — Z23 Encounter for immunization: Secondary | ICD-10-CM | POA: Diagnosis not present

## 2021-01-03 DIAGNOSIS — E785 Hyperlipidemia, unspecified: Secondary | ICD-10-CM

## 2021-01-03 DIAGNOSIS — Z1211 Encounter for screening for malignant neoplasm of colon: Secondary | ICD-10-CM

## 2021-01-03 DIAGNOSIS — E119 Type 2 diabetes mellitus without complications: Secondary | ICD-10-CM

## 2021-01-03 DIAGNOSIS — D509 Iron deficiency anemia, unspecified: Secondary | ICD-10-CM | POA: Diagnosis not present

## 2021-01-03 DIAGNOSIS — M2550 Pain in unspecified joint: Secondary | ICD-10-CM | POA: Diagnosis not present

## 2021-01-03 DIAGNOSIS — M5090 Cervical disc disorder, unspecified, unspecified cervical region: Secondary | ICD-10-CM

## 2021-01-03 DIAGNOSIS — I10 Essential (primary) hypertension: Secondary | ICD-10-CM

## 2021-01-03 DIAGNOSIS — D649 Anemia, unspecified: Secondary | ICD-10-CM

## 2021-01-03 DIAGNOSIS — Z0000 Encounter for general adult medical examination without abnormal findings: Secondary | ICD-10-CM

## 2021-01-03 LAB — CBC WITH DIFFERENTIAL/PLATELET
Basophils Absolute: 0 10*3/uL (ref 0.0–0.1)
Basophils Relative: 0.6 % (ref 0.0–3.0)
Eosinophils Absolute: 0.1 10*3/uL (ref 0.0–0.7)
Eosinophils Relative: 2.2 % (ref 0.0–5.0)
HCT: 36.2 % — ABNORMAL LOW (ref 39.0–52.0)
Hemoglobin: 11.9 g/dL — ABNORMAL LOW (ref 13.0–17.0)
Lymphocytes Relative: 18.8 % (ref 12.0–46.0)
Lymphs Abs: 1.2 10*3/uL (ref 0.7–4.0)
MCHC: 33 g/dL (ref 30.0–36.0)
MCV: 84.4 fl (ref 78.0–100.0)
Monocytes Absolute: 0.8 10*3/uL (ref 0.1–1.0)
Monocytes Relative: 12.6 % — ABNORMAL HIGH (ref 3.0–12.0)
Neutro Abs: 4.3 10*3/uL (ref 1.4–7.7)
Neutrophils Relative %: 65.8 % (ref 43.0–77.0)
Platelets: 162 10*3/uL (ref 150.0–400.0)
RBC: 4.29 Mil/uL (ref 4.22–5.81)
RDW: 15.2 % (ref 11.5–15.5)
WBC: 6.5 10*3/uL (ref 4.0–10.5)

## 2021-01-03 LAB — BASIC METABOLIC PANEL
BUN: 26 mg/dL — ABNORMAL HIGH (ref 6–23)
CO2: 27 mEq/L (ref 19–32)
Calcium: 9.7 mg/dL (ref 8.4–10.5)
Chloride: 102 mEq/L (ref 96–112)
Creatinine, Ser: 1.05 mg/dL (ref 0.40–1.50)
GFR: 72.67 mL/min (ref >60.00)
Glucose, Bld: 61 mg/dL — ABNORMAL LOW (ref 70–99)
Potassium: 4 mEq/L (ref 3.5–5.1)
Sodium: 139 mEq/L (ref 135–145)

## 2021-01-03 LAB — TSH: TSH: 3.27 u[IU]/mL (ref 0.35–5.50)

## 2021-01-03 MED ORDER — CYCLOBENZAPRINE HCL 10 MG PO TABS: ORAL_TABLET | ORAL | 1 refills | Status: DC

## 2021-01-03 NOTE — Patient Instructions (Signed)
You can try diclofenac gel on your knuckles and see if that helps.  Take care.  Glad to see you. Go to the lab on the way out.   If you have mychart we'll likely use that to update you.

## 2021-01-03 NOTE — Progress Notes (Signed)
This visit occurred during the SARS-CoV-2 public health emergency.  Safety protocols were in place, including screening questions prior to the visit, additional usage of staff PPE, and extensive cleaning of exam room while observing appropriate contact time as indicated for disinfecting solutions.  Diabetes per endo.  I will defer.  He agrees.  Elevated Cholesterol: Using medications without problems: yes Muscle aches: no Diet compliance: yes Exercise: yes Prev lipids d/w pt.    Hypertension:    Using medication without problems or lightheadedness:  yes Chest pain with exertion:no Edema:no Short of breath:no  Labs pending.    H/o anemia, recheck labs pending.  Not on iron currently.    Tramadol and flexeril help his back and neck pain.  No ADE on med.  Compliant.  He didn't tolerate tizanidine.    Tetanus 2022 Flu today  PNA up to date.  Covid prev done Shingles d/w pt.   Colon cancer screening d/w pt.  Refer back to GI 2022.   Prostate cancer screening and PSA options (with potential risks and benefits of testing vs not testing) were discussed along with recent recs/guidelines.  He declined testing PSA at this point. Wife designated if patient were incapacitated.   Needs f/u with CT lung cancer screening program set up.  I told him I would check on this. Prev AAA eval done  He is putting up with B DIP and PIP arthritis.  Grip is affected, d/w pt.  He has used Aspercreme in the past with some relief.     PMH and SH reviewed  Meds, vitals, and allergies reviewed.   ROS: Per HPI unless specifically indicated in ROS section   GEN: nad, alert and oriented HEENT: ncat NECK: supple w/o LA CV: rrr. PULM: ctab, no inc wob ABD: soft, +bs EXT: no edema SKIN: Well-perfused.

## 2021-01-05 ENCOUNTER — Other Ambulatory Visit: Payer: Self-pay | Admitting: Family Medicine

## 2021-01-05 DIAGNOSIS — D649 Anemia, unspecified: Secondary | ICD-10-CM

## 2021-01-05 NOTE — Assessment & Plan Note (Signed)
Per endocrinology.  I will defer. 

## 2021-01-05 NOTE — Assessment & Plan Note (Signed)
H/o anemia, recheck labs pending.  Not on iron currently.   See notes on labs.

## 2021-01-05 NOTE — Assessment & Plan Note (Signed)
Tramadol and flexeril help his back and neck pain.  No ADE on med.  Compliant.  He didn't tolerate tizanidine.   Continue tramadol and Flexeril.

## 2021-01-05 NOTE — Assessment & Plan Note (Signed)
Continue Crestor 

## 2021-01-05 NOTE — Assessment & Plan Note (Signed)
Continue lisinopril.  See notes on labs. 

## 2021-01-05 NOTE — Assessment & Plan Note (Signed)
Tetanus 2022 Flu today  PNA up to date.  Covid prev done Shingles d/w pt.   Colon cancer screening d/w pt.  Refer back to GI 2022.   Prostate cancer screening and PSA options (with potential risks and benefits of testing vs not testing) were discussed along with recent recs/guidelines.  He declined testing PSA at this point. Wife designated if patient were incapacitated.   Needs f/u with CT lung cancer screening program set up.  I told him I would check on this. Prev AAA eval done

## 2021-01-05 NOTE — Assessment & Plan Note (Signed)
Discussed trying diclofenac gel on his hands for his PIP and DIP joint pain.  He can update me as needed.

## 2021-01-06 ENCOUNTER — Other Ambulatory Visit: Payer: Self-pay | Admitting: *Deleted

## 2021-01-06 DIAGNOSIS — Z87891 Personal history of nicotine dependence: Secondary | ICD-10-CM

## 2021-01-10 DIAGNOSIS — E119 Type 2 diabetes mellitus without complications: Secondary | ICD-10-CM | POA: Diagnosis not present

## 2021-01-10 DIAGNOSIS — Z794 Long term (current) use of insulin: Secondary | ICD-10-CM | POA: Diagnosis not present

## 2021-01-17 ENCOUNTER — Other Ambulatory Visit: Payer: Self-pay

## 2021-01-17 ENCOUNTER — Ambulatory Visit
Admission: RE | Admit: 2021-01-17 | Discharge: 2021-01-17 | Disposition: A | Discharge status: Home / Self Care | Payer: Medicare Other | Source: Ambulatory Visit | Attending: Acute Care | Admitting: Acute Care

## 2021-01-17 DIAGNOSIS — Z87891 Personal history of nicotine dependence: Secondary | ICD-10-CM | POA: Insufficient documentation

## 2021-01-20 ENCOUNTER — Other Ambulatory Visit: Payer: Self-pay | Admitting: Acute Care

## 2021-01-20 DIAGNOSIS — Z87891 Personal history of nicotine dependence: Secondary | ICD-10-CM

## 2021-02-09 DIAGNOSIS — Z794 Long term (current) use of insulin: Secondary | ICD-10-CM | POA: Diagnosis not present

## 2021-02-09 DIAGNOSIS — E119 Type 2 diabetes mellitus without complications: Secondary | ICD-10-CM | POA: Diagnosis not present

## 2021-02-22 ENCOUNTER — Ambulatory Visit (INDEPENDENT_AMBULATORY_CARE_PROVIDER_SITE_OTHER): Payer: Medicare HMO | Admitting: Endocrinology

## 2021-02-22 ENCOUNTER — Other Ambulatory Visit: Payer: Self-pay

## 2021-02-22 ENCOUNTER — Encounter: Payer: Self-pay | Admitting: Endocrinology

## 2021-02-22 VITALS — BP 130/70 | HR 107 | Ht 70.0 in | Wt 213.6 lb

## 2021-02-22 DIAGNOSIS — E11649 Type 2 diabetes mellitus with hypoglycemia without coma: Secondary | ICD-10-CM | POA: Diagnosis not present

## 2021-02-22 LAB — POCT GLYCOSYLATED HEMOGLOBIN (HGB A1C): Hemoglobin A1C: 9.2 % — AB (ref 4.0–5.6)

## 2021-02-22 MED ORDER — NOVOLIN R FLEXPEN 100 UNIT/ML IJ SOPN: 25.0000 [IU] | PEN_INJECTOR | Freq: Every day | INTRAMUSCULAR | 3 refills | Status: DC

## 2021-02-22 MED ORDER — NOVOLIN N FLEXPEN 100 UNIT/ML ~~LOC~~ SUPN: 170.0000 [IU] | PEN_INJECTOR | SUBCUTANEOUS | 3 refills | Status: DC

## 2021-02-22 NOTE — Patient Instructions (Addendum)
check your blood sugar 4 times a day.  vary the time of day when you check, between before the 3 meals, and at bedtime.  also check if you have symptoms of your blood sugar being too high or too low.  please keep a record of the readings and bring it to your next appointment here (or you can bring the meter itself).  You can write it on any piece of paper.  please call us sooner if your blood sugar goes below 70, or if you have a lot of readings over 200.   Please change the insulin to 170 units each morning.  However, if you are going to be active that day, take just 140 units that morning.  If you are unable to anticipate the meal, eat a light snack with the exertion.   Let's change the evening NPH to regular ("R" on the label), 25 units with supper.   Please come back for a follow-up appointment in 2 months.

## 2021-02-22 NOTE — Progress Notes (Signed)
Subjective:    Patient ID: Anthony Cuevas, male    DOB: 1950/06/29, 71 y.o.   MRN: [id number]  HPI Pt returns for f/u of diabetes mellitus:  DM type: Insulin-requiring type 2 Dx'ed: 2993 Complications: CAD and PAD.   Therapy: insulin since 2006, and metformin.   DKA: never Severe hypoglycemia: never.   Pancreatitis: never.   Pancreatic imaging: never.   SDOH: he takes human insulin, due to cost.   Other: he stopped pioglitizone, due to edema; he takes BID insulin, after poor results with multiple daily injections; he eats meals at 9AM, 2PM, and 7PM.    Interval history: continuous glucose monitor data are reviewed.  glucose varies from 70-400.  It is in general highest at 12MN. It decreases until 8AM, is relatively flat until 6PM, then re-increases until 12MN.  He has mild hypoglycemia approx QOD. This happens in the afternoon, with exercise, or if he misses lunch.  Pt says he never misses the insulin.   Past Medical History:  Diagnosis Date   Arthritis    Coronary atherosclerosis    a. 07/2018 CT Chest: 3 vessel coronary atherosclerosis; b. 07/2018 MV: EF 46% (GI uptake artifact), No ischemia/infarct. Mild to mod diffuse Ao atherosclerosis and at least moderate 2 vessel CAD (LAD/RCA) on CT imaging. Low risk.   Depression    improved on sertraline   Diabetes mellitus, type 2 ([place]) 09/1996   GERD (gastroesophageal reflux disease) 1990   History of peptic ulcer disease    Hyperlipidemia 1992   Hypertension 05/2003   Lumbar stenosis L2-3   Muscle atrophy    in arms from degenerative changes in neck   Neck pain    chronic   Smoker    Wears glasses     Past Surgical History:  Procedure Laterality Date   ABDOMINAL AORTOGRAM W/LOWER EXTREMITY N/A 09/03/2019   Procedure: ABDOMINAL AORTOGRAM W/LOWER EXTREMITY;  Surgeon: Wellington Hampshire, MD;  Location: [facility];  Service: Cardiovascular;  Laterality: N/A;  Limited Study   CERVICAL DISC SURGERY  04/19/02   fusion, Dr. Lorin Mercy    CERVICAL DISCECTOMY  1997   partial   CERVICAL DISCECTOMY  2000   COLONOSCOPY WITH ESOPHAGOGASTRODUODENOSCOPY (EGD)  06/25/2015   HEMORRHOID SURGERY  1989   KNEE ARTHROPLASTY  11/20/2011   Procedure: COMPUTER ASSISTED TOTAL KNEE ARTHROPLASTY;  Surgeon: Marybelle Killings, MD;  Location: [facility];  Service: Orthopedics;  Laterality: Left;  Conversion Left Knee Medial Uni to Total Knee Arthroplasty-Cemented   LUMBAR LAMINECTOMY/DECOMPRESSION MICRODISCECTOMY N/A 04/28/2015   Procedure: L2-3 Decompression;  Surgeon: Marybelle Killings, MD;  Location: [facility];  Service: Orthopedics;  Laterality: N/A;   MR MRA DUPLICATE EXAM  INACTIVATE     MULTIPLE TOOTH EXTRACTIONS     Neisen funduplication  [id number]   PERIPHERAL VASCULAR INTERVENTION  09/03/2019   Procedure: PERIPHERAL VASCULAR INTERVENTION;  Surgeon: Wellington Hampshire, MD;  Location: [facility];  Service: Cardiovascular;;  Iliac Stent   REPLACEMENT TOTAL KNEE  02/14/02, 05/04/02   left- partial   SCC EXCISION  04/25/01   Dr. March Rummage   STOMACH SURGERY  1984   PUD, HH    Social History   Socioeconomic History   Marital status: Married    Spouse name: Not on file   Number of children: 3   Years of education: Not on file   Highest education level: Not on file  Occupational History   Occupation: Training and development officer, retired  Comment: Scientific laboratory technician  Tobacco Use   Smoking status: Former    Packs/day: 1.00    Years: 41.00    Pack years: 41.00    Types: Cigarettes    Quit date: 2014    Years since quitting: 9.0   Smokeless tobacco: Never  Vaping Use   Vaping Use: Never used  Substance and Sexual Activity   Alcohol use: No    Alcohol/week: 0.0 standard drinks   Drug use: No   Sexual activity: Never  Other Topics Concern   Not on file  Social History Narrative   Retired Education officer, museum, [employer], aviation fuel, [phone], no known agent orange exposure but 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   Married 1975   3 kids   Social Determinants of  Health   Financial Resource Strain: Low Risk    Difficulty of Paying Living Expenses: Not hard at all  Food Insecurity: No Food Insecurity   Worried About Charity fundraiser in the Last Year: Never true   Arboriculturist in the Last Year: Never true  Transportation Needs: No Transportation Needs   Lack of Transportation (Medical): No   Lack of Transportation (Non-Medical): No  Physical Activity: Insufficiently Active   Days of Exercise per Week: 7 days   Minutes of Exercise per Session: 10 min  Stress: No Stress Concern Present   Feeling of Stress : Not at all  Social Connections: Moderately Isolated   Frequency of Communication with Friends and Family: More than three times a week   Frequency of Social Gatherings with Friends and Family: Once a week   Attends Religious Services: Never   Marine scientist or Organizations: No   Attends Music therapist: Never   Marital Status: Married  Human resources officer Violence: Not At Risk   Fear of Current or Ex-Partner: No   Emotionally Abused: No   Physically Abused: No   Sexually Abused: No    Current Outpatient Medications on File Prior to Visit  Medication Sig Dispense Refill   Cholecalciferol (VITAMIN D3) 125 MCG (5000 UT) CAPS Take 5,000 Units by mouth 2 (two) times a week.     clopidogrel (PLAVIX) 75 MG tablet TAKE 1 TABLET DAILY 90 tablet 2   Continuous Blood Gluc Sensor (FREESTYLE LIBRE 14 DAY SENSOR) MISC 1 Device by Does not apply route every 14 (fourteen) days. 6 each 3   cyanocobalamin 1000 MCG tablet Take 1,000 mcg by mouth daily.     cyclobenzaprine (FLEXERIL) 10 MG tablet TAKE 1 TABLET TWICE A DAY AS NEEDED FOR MUSCLE SPASMS (SEDATION CAUTION) 180 tablet 1   latanoprost (XALATAN) 0.005 % ophthalmic solution 1 drop at bedtime.     lisinopril (ZESTRIL) 40 MG tablet TAKE 1 TABLET DAILY 90 tablet 3   metFORMIN (GLUCOPHAGE) 1000 MG tablet TAKE 1 TABLET TWICE A DAY WITH MEALS 180 tablet 3   Omega-3 Fatty Acids  (FISH OIL PO) Take 1,000 mg by mouth once a week.     ONETOUCH ULTRA test strip USE AS INSTRUCTED TO TEST BLOOD SUGARS 3 OR 4 TIMES DAILY 100 each 11   SURE COMFORT PEN NEEDLES 32G X 4 MM MISC USE THREE TIMES A DAY 100 each 10   traMADol (ULTRAM) 50 MG tablet TAKE 1 TABLET EVERY 8 HOURS AS NEEDED FOR CHRONIC JOINT/BACK PAIN (SEDATION CAUTION) 270 tablet 0   rosuvastatin (CRESTOR) 20 MG tablet Take 1 tablet (20 mg total) by mouth daily. 90 tablet 3  No current facility-administered medications on file prior to visit.    Allergies  Allergen Reactions   Tizanidine     Drowsy with med use.  Tolerates flexeril    Family History  Problem Relation Age of Onset   Diabetes Mother    Hypertension Mother    Stroke Mother    Diabetes Sister    Diabetes Brother    Diabetes Brother    Heart disease Brother    Diabetes Father    Breast cancer Maternal Grandmother        breast   Lung disease Paternal Grandfather        black lung   Arthritis Neg Hx    Prostate cancer Neg Hx    Colon cancer Neg Hx    Stomach cancer Neg Hx     BP 130/70    Pulse (!) 107    Ht 5\' 10"  (1.778 m)    Wt 213 lb 9.6 oz (96.9 kg)    SpO2 99%    BMI 30.65 kg/m    Review of Systems     Objective:   Physical Exam    Lab Results  Component Value Date   CREATININE 1.05 01/03/2021   BUN 26 (H) 01/03/2021   NA 139 01/03/2021   K 4.0 01/03/2021   CL 102 01/03/2021   CO2 27 01/03/2021   Lab Results  Component Value Date   HGBA1C 9.2 (A) 02/22/2021      Assessment & Plan:  Insulin-requiring type 2 DM: uncontrolled Hypoglycemia, due to insulin: The pattern of his cbg's indicates he needs some adjustment in his therapy  Patient Instructions  check your blood sugar 4 times a day.  vary the time of day when you check, between before the 3 meals, and at bedtime.  also check if you have symptoms of your blood sugar being too high or too low.  please keep a record of the readings and bring it to your next  appointment here (or you can bring the meter itself).  You can write it on any piece of paper.  please call us sooner if your blood sugar goes below 70, or if you have a lot of readings over 200.   Please change the insulin to 170 units each morning.  However, if you are going to be active that day, take just 140 units that morning.  If you are unable to anticipate the meal, eat a light snack with the exertion.   Let's change the evening NPH to regular ("R" on the label), 25 units with supper.   Please come back for a follow-up appointment in 2 months.

## 2021-02-28 ENCOUNTER — Other Ambulatory Visit: Payer: Self-pay | Admitting: Cardiovascular Disease

## 2021-02-28 ENCOUNTER — Encounter: Payer: Self-pay | Admitting: Gastroenterology

## 2021-02-28 DIAGNOSIS — E785 Hyperlipidemia, unspecified: Secondary | ICD-10-CM

## 2021-03-11 DIAGNOSIS — E119 Type 2 diabetes mellitus without complications: Secondary | ICD-10-CM | POA: Diagnosis not present

## 2021-03-11 DIAGNOSIS — Z794 Long term (current) use of insulin: Secondary | ICD-10-CM | POA: Diagnosis not present

## 2021-03-15 ENCOUNTER — Ambulatory Visit (INDEPENDENT_AMBULATORY_CARE_PROVIDER_SITE_OTHER): Payer: Medicare HMO | Admitting: Podiatry

## 2021-03-15 ENCOUNTER — Encounter: Payer: Self-pay | Admitting: Podiatry

## 2021-03-15 ENCOUNTER — Other Ambulatory Visit: Payer: Self-pay

## 2021-03-15 DIAGNOSIS — B351 Tinea unguium: Secondary | ICD-10-CM | POA: Diagnosis not present

## 2021-03-15 DIAGNOSIS — M2010 Hallux valgus (acquired), unspecified foot: Secondary | ICD-10-CM | POA: Diagnosis not present

## 2021-03-15 DIAGNOSIS — E119 Type 2 diabetes mellitus without complications: Secondary | ICD-10-CM

## 2021-03-15 DIAGNOSIS — M79676 Pain in unspecified toe(s): Secondary | ICD-10-CM

## 2021-03-15 NOTE — Progress Notes (Signed)
This patient returns to my office for at risk foot care.  This patient requires this care by a professional since this patient will be at risk due to having type 2 diabetes.  This patient is unable to cut nails himself since the patient cannot reach his nails.These nails are painful walking and wearing shoes.  This patient presents for at risk foot care today.  General Appearance  Alert, conversant and in no acute stress.  Vascular  Dorsalis pedis and posterior tibial  pulses are palpable  bilaterally.  Capillary return is within normal limits  bilaterally. Temperature is within normal limits  bilaterally.  Neurologic  Senn-Weinstein monofilament wire test diminished  bilaterally. Muscle power within normal limits bilaterally.  Nails Thick disfigured discolored nails with subungual debris  from hallux to fifth toes bilaterally. No evidence of bacterial infection or drainage bilaterally.  Orthopedic  No limitations of motion  feet .  No crepitus or effusions noted.  No bony pathology or digital deformities noted.  HAV  B/L.  Skin  normotropic skin with no porokeratosis noted bilaterally.  No signs of infections or ulcers noted.     Onychomycosis  Pain in right toes  Pain in left toes  Consent was obtained for treatment procedures.   Mechanical debridement of nails 1-5  bilaterally performed with a nail nipper.  Filed with dremel without incident.    Return office visit    12  weeks                  Told patient to return for periodic foot care and evaluation due to potential at risk complications.   Juleon Narang DPM  

## 2021-03-24 NOTE — Progress Notes (Signed)
03/25/2021 Anthony Cuevas [id number] 03/17/1950   ASSESSMENT AND PLAN:   Iron deficiency anemia, unspecified iron deficiency anemia type -     CBC with Differential/Platelet; Future -     Iron and TIBC; Future -     Ferritin; Future - was improved on iron supplements, has been off - most recent CBC with decreased H/H, will check iron/ferritin, if low may want to consider adding on EGD versus capsule endoscopy  PAD (peripheral artery disease) (Anthony Cuevas) Patient told to hold his Plavix for 5 days prior to time of procedure.  We will communicate with his prescribing physician Anthony Cuevas to ensure that holding his Plavix is acceptable for him.  Alternatives to the procedure were discussed as well as risks and benefits of procedure including bleeding, perforation, infection, missed lesions, medication reactions and possible hospitalization or surgery if complications occur explained. Additional rare but real risk of cardiovascular event such as heart attack or ischemia/infarct of other organs off Plavix explained and need to seek urgent help if this occurs.  He is agreeable and wishes to proceed.   Insulin dependent type 2 diabetes mellitus (HCC) On insulin  Coronary artery disease involving native coronary artery of native heart without angina pectoris No chest pain August 19, 2018 low risk stress test  Screen for colon cancer -     PEG-KCl-NaCl-NaSulf-Na Asc-C (PLENVU) 140 g SOLR; Use as directed for colonoscopy prep.- had fair prep last time with just miralax, will do plenvu this time.  -     Ambulatory referral to Gastroenterology   Future Appointments  Date Time Provider [place]  04/04/2021 10:35 AM [facility]  04/07/2021  1:30 PM Anthony Cuevas [facility]  04/26/2021  2:30 PM Anthony Cuevas [facility] None  06/14/2021  3:00 PM [facility]    Patient Care Team: Anthony Cuevas as PCP - General (Family  Medicine) Anthony Cuevas as PCP - Cardiology (Cardiology) Anthony Cuevas as Consulting Physician (Orthopedic Surgery) Anthony Cuevas as Physician Assistant (Gastroenterology) Anthony Cuevas as Consulting Physician (Podiatry) Anthony Cuevas, [place] as Referring Physician (Optometry) Anthony Cuevas as Consulting Physician (Endocrinology)  HISTORY OF PRESENT ILLNESS: 71 y.o. male referred by Anthony Cuevas, with a past medical history of hypertension, hyperlipidemia, tobacco use, type 2 diabetes insulin-dependent, history of peptic ulcer disease, IDA, cervical disc surgery, hemorrhoid surgery, stomach surgery 1984 and others listed below presents for consultation of screening colonoscopy in setting of Plavix. Patient known to Dr. Loletha Cuevas Is on Plavix for PAD status post stent left common iliac 08/2019, follows with Dr. Kathlyn [place] last seen 09/29/31 August 19, 2018 low risk stress test  06/25/2015 colonoscopy and endoscopy for IDA Fair prep, normal colonoscopy, repeat 5 years  (05/2020) 2 mm nonbleeding angiodysplastic lesion gastric body, loose Nissan fundoplication normal duodenum Last seen by Dr. Loletha Cuevas 12/11/2018 for IDA, possibly related to Celebrex use or AVM, suggested slow release iron and if persistent small capsule study. He is not on iron supplement anymore.   CBC on 01/03/2021   WBC 6.5 HGB 11.9 MCV 84.4 Platelets 162.0- slight anemia last time, normocytic Anemia studies on 12/05/2019  Iron 118 - due for repeat  Patient does not complain of GERD.  Patient denies dysphagia, nausea, vomiting, melena.  Has BM once a day without straining.  Patient denies change in bowel habits, constipation, diarrhea, hematochezia.  Denies changes in appetite, unintentional  weight loss.  Patient denies  family history of colon cancer or other gastrointestinal malignancies.  Current Medications:   Current Outpatient Medications (Endocrine & Metabolic):    Insulin NPH,  Human,, Isophane, (NOVOLIN N FLEXPEN) 100 UNIT/ML Kiwkpen, Inject 170 Units into the skin every morning. 170 units each morning, and 25 units each evening, and pen needles 3/day.   Insulin Regular Human (NOVOLIN R FLEXPEN RELION) 100 UNIT/ML KwikPen, Inject 25 Units as directed daily with supper.   metFORMIN (GLUCOPHAGE) 1000 MG tablet, TAKE 1 TABLET TWICE A DAY WITH MEALS  Current Outpatient Medications (Cardiovascular):    lisinopril (ZESTRIL) 40 MG tablet, TAKE 1 TABLET DAILY   rosuvastatin (CRESTOR) 20 MG tablet, TAKE 1 TABLET DAILY (Patient not taking: Reported on 03/25/2021)   Current Outpatient Medications (Analgesics):    traMADol (ULTRAM) 50 MG tablet, TAKE 1 TABLET EVERY 8 HOURS AS NEEDED FOR CHRONIC JOINT/BACK PAIN (SEDATION CAUTION)  Current Outpatient Medications (Hematological):    clopidogrel (PLAVIX) 75 MG tablet, TAKE 1 TABLET DAILY   cyanocobalamin 1000 MCG tablet, Take 1,000 mcg by mouth daily.  Current Outpatient Medications (Other):    Cholecalciferol (VITAMIN D3) 125 MCG (5000 UT) CAPS, Take 5,000 Units by mouth 2 (two) times a week.   Continuous Blood Gluc Sensor (FREESTYLE LIBRE 14 DAY SENSOR) MISC, 1 Device by Does not apply route every 14 (fourteen) days.   cyclobenzaprine (FLEXERIL) 10 MG tablet, TAKE 1 TABLET TWICE A DAY AS NEEDED FOR MUSCLE SPASMS (SEDATION CAUTION)   latanoprost (XALATAN) 0.005 % ophthalmic solution, 1 drop at bedtime.   Omega-3 Fatty Acids (FISH OIL PO), Take 1,000 mg by mouth once a week.   ONETOUCH ULTRA test strip, USE AS INSTRUCTED TO TEST BLOOD SUGARS 3 OR 4 TIMES DAILY   PEG-KCl-NaCl-NaSulf-Na Asc-C (PLENVU) 140 g SOLR, Use as directed for colonoscopy prep.   SURE COMFORT PEN NEEDLES 32G X 4 MM MISC, USE THREE TIMES A DAY  Medical History:  Past Medical History:  Diagnosis Date   Arthritis    Coronary atherosclerosis    a. 07/2018 CT Chest: 3 vessel coronary atherosclerosis; b. 07/2018 MV: EF 46% (GI uptake artifact), No  ischemia/infarct. Mild to mod diffuse Ao atherosclerosis and at least moderate 2 vessel CAD (LAD/RCA) on CT imaging. Low risk.   Depression    improved on sertraline   Diabetes mellitus, type 2 ([place]) 09/1996   GERD (gastroesophageal reflux disease) 1990   History of peptic ulcer disease    Hyperlipidemia 1992   Hypertension 05/2003   Lumbar stenosis L2-3   Muscle atrophy    in arms from degenerative changes in neck   Neck pain    chronic   Smoker    Wears glasses    Allergies:  Allergies  Allergen Reactions   Tizanidine     Drowsy with med use.  Tolerates flexeril     Surgical History:  He  has a past surgical history that includes Stomach surgery (01/30/1982); Hemorrhoid surgery (01/31/1987); Cervical discectomy (01/31/1995); Cervical discectomy (01/30/1998); SCC EXCISION (04/25/2001); Replacement total knee (Left, 02/14/02, 05/04/02); Cervical disc surgery (93/81/8299); MR MRA DUPLICATE EXAM  INACTIVATE; Neisen funduplication (37/16/9678); Knee Arthroplasty (11/20/2011); Multiple tooth extractions; Lumbar laminectomy/decompression microdiscectomy (N/A, 04/28/2015); Colonoscopy with esophagogastroduodenoscopy (egd) (06/25/2015); ABDOMINAL AORTOGRAM W/LOWER EXTREMITY (N/A, 09/03/2019); and PERIPHERAL VASCULAR INTERVENTION (09/03/2019). Family History:  His family history includes Breast cancer in his maternal grandmother; Diabetes in his brother, brother, father, mother, and sister; Heart disease in his brother and son; Hypertension in his mother; Lung  disease in his paternal grandfather; Stroke in his mother. Social History:   reports that he quit smoking about 9 years ago. His smoking use included cigarettes. He has a 41.00 pack-year smoking history. He has never used smokeless tobacco. He reports that he does not drink alcohol and does not use drugs.  REVIEW OF SYSTEMS  : All other systems reviewed and negative except where noted in the History of Present Illness.  PHYSICAL EXAM: BP  (!) 124/52 (BP Location: Right Arm, Patient Position: Sitting, Cuff Size: Normal)    Pulse 96    Ht 5' 7.5" (1.715 m) Comment: height measured without shoes   Wt 211 lb 8 oz (95.9 kg)    BMI 32.64 kg/m  General:   Pleasant, well developed male in no acute distress Head:  Normocephalic and atraumatic. Eyes: sclerae anicteric,conjunctive pink  Heart:  regular rate and rhythm, no murmurs or gallops Pulm: Clear anteriorly; no wheezing Abdomen:  Soft, Obese AB, skin exam normal, Normal bowel sounds.  no  tenderness . Without guarding and Without rebound, without hepatomegaly. Extremities:  Without edema. Msk:  Symmetrical without gross deformities. Peripheral pulses intact.  Neurologic:  Alert and  oriented x4;  grossly normal neurologically. Skin:   Dry and intact without significant lesions or rashes. Psychiatric: Demonstrates good judgement and reason without abnormal affect or behaviors.   Vladimir Crofts, Cuevas 9:52 AM

## 2021-03-25 ENCOUNTER — Encounter: Payer: Self-pay | Admitting: Physician Assistant

## 2021-03-25 ENCOUNTER — Other Ambulatory Visit (INDEPENDENT_AMBULATORY_CARE_PROVIDER_SITE_OTHER): Payer: Medicare HMO

## 2021-03-25 ENCOUNTER — Other Ambulatory Visit: Payer: Self-pay | Admitting: Endocrinology

## 2021-03-25 ENCOUNTER — Telehealth: Payer: Self-pay

## 2021-03-25 ENCOUNTER — Ambulatory Visit (INDEPENDENT_AMBULATORY_CARE_PROVIDER_SITE_OTHER): Payer: Medicare HMO | Admitting: Physician Assistant

## 2021-03-25 VITALS — BP 124/52 | HR 96 | Ht 67.5 in | Wt 211.5 lb

## 2021-03-25 DIAGNOSIS — Z1211 Encounter for screening for malignant neoplasm of colon: Secondary | ICD-10-CM

## 2021-03-25 DIAGNOSIS — Z794 Long term (current) use of insulin: Secondary | ICD-10-CM | POA: Diagnosis not present

## 2021-03-25 DIAGNOSIS — I2510 Atherosclerotic heart disease of native coronary artery without angina pectoris: Secondary | ICD-10-CM | POA: Diagnosis not present

## 2021-03-25 DIAGNOSIS — E119 Type 2 diabetes mellitus without complications: Secondary | ICD-10-CM

## 2021-03-25 DIAGNOSIS — I739 Peripheral vascular disease, unspecified: Secondary | ICD-10-CM | POA: Diagnosis not present

## 2021-03-25 DIAGNOSIS — D509 Iron deficiency anemia, unspecified: Secondary | ICD-10-CM

## 2021-03-25 LAB — CBC WITH DIFFERENTIAL/PLATELET
Basophils Absolute: 0 10*3/uL (ref 0.0–0.1)
Basophils Relative: 0.6 % (ref 0.0–3.0)
Eosinophils Absolute: 0.1 10*3/uL (ref 0.0–0.7)
Eosinophils Relative: 2.5 % (ref 0.0–5.0)
HCT: 35.7 % — ABNORMAL LOW (ref 39.0–52.0)
Hemoglobin: 11.5 g/dL — ABNORMAL LOW (ref 13.0–17.0)
Lymphocytes Relative: 19.8 % (ref 12.0–46.0)
Lymphs Abs: 1.1 10*3/uL (ref 0.7–4.0)
MCHC: 32.2 g/dL (ref 30.0–36.0)
MCV: 85.6 fl (ref 78.0–100.0)
Monocytes Absolute: 0.8 10*3/uL (ref 0.1–1.0)
Monocytes Relative: 14.4 % — ABNORMAL HIGH (ref 3.0–12.0)
Neutro Abs: 3.6 10*3/uL (ref 1.4–7.7)
Neutrophils Relative %: 62.7 % (ref 43.0–77.0)
Platelets: 172 10*3/uL (ref 150.0–400.0)
RBC: 4.17 Mil/uL — ABNORMAL LOW (ref 4.22–5.81)
RDW: 14.9 % (ref 11.5–15.5)
WBC: 5.7 10*3/uL (ref 4.0–10.5)

## 2021-03-25 LAB — FERRITIN: Ferritin: 8.3 ng/mL — ABNORMAL LOW (ref 22.0–322.0)

## 2021-03-25 MED ORDER — PLENVU 140 G PO SOLR: ORAL | 0 refills | Status: DC

## 2021-03-25 NOTE — Telephone Encounter (Signed)
Hold Plavix 5 days before and resume after.

## 2021-03-25 NOTE — Telephone Encounter (Signed)
° °  Patient Name: [name]  DOB: 04/08/1950 MRN: [id number]  Primary Cardiologist: Ida Rogue, MD  Chart reviewed as part of pre-operative protocol coverage.  71 year old male with a history of coronary atherosclerosis noted on prior CT (negative stress test in 2020), PAD (s/p left common iliac artery stent in 2021), hypertension, and hyperlipidemia.    Most recent ABIs in August 2022 showed patent left iliac stent, borderline stenosis in the left external iliac artery. He last saw Dr. Fletcher Anon on 8/3//2022 and was stable overall.  Repeat arterial Doppler studies recommended for August 2023.  Surgical clearance request received for colonoscopy scheduled for 04/07/2021 with [facility].  Dr. Fletcher Anon, please advise on holding Plavix (for 5 days) prior to procedure.  Thank you.    Lenna Sciara, NP 03/25/2021, 11:10 AM

## 2021-03-25 NOTE — Telephone Encounter (Signed)
° °  Primary Cardiologist: Ida Rogue, MD  Chart reviewed as part of pre-operative protocol coverage. Given past medical history and time since last visit, based on ACC/AHA guidelines, Anthony Cuevas would be at acceptable risk for the planned procedure without further cardiovascular testing.   His Plavix may be held for 5 days prior to his procedure.  Please resume as soon as hemostasis is achieved.  I will route this recommendation to the requesting party via Epic fax function and remove from pre-op pool.  Please call with questions.  Jossie Ng. Latoyna Hird NP-C    03/25/2021, 4:58 PM [place] [address] Office [phone] Fax [phone]

## 2021-03-25 NOTE — Progress Notes (Signed)
____________________________________________________________  Attending physician addendum:  Thank you for sending this case to me. I have reviewed the entire note and agree with the plan.  Yes, he should also have an EGD because of the recurrent iron deficiency anemia.  In addition, he should be on an oral iron supplement.  Please have clinical staff contact him with this change of endoscopy plans (because it will have to be moved to another date for sufficient time to do both procedures), and with recommendation to take slow release iron 65 mg once daily.  Wilfrid Lund, MD  ____________________________________________________________

## 2021-03-25 NOTE — Patient Instructions (Signed)
You have been scheduled for a colonoscopy. Please follow the written instructions given to you at your visit today. Please pick up your prep supplies at the pharmacy within the next 1-3 days. If you use inhalers (even only as needed), please bring them with you on the day of your procedure.  Please proceed to the basement level for lab work before leaving today. Press "B" on the elevator. The lab is located at the first door on the left as you exit the elevator.  HEALTHCARE LAWS AND MY CHART RESULTS:   Due to recent changes in healthcare laws, you may see results of your imaging and/or laboratory studies on MyChart before I have had a chance to review them.  I understand that in some cases there may be results that are confusing or concerning to you. Please understand that not all results are received at the same time and often I may need to interpret multiple results in order to provide you with the best plan of care or course of treatment. Therefore, I ask that you please give me 48 hours to thoroughly review all your results before contacting my office for clarification.   BMI:  If you are age 70 or older, your body mass index should be between 23-30. Your Body mass index is 32.64 kg/m. If this is out of the aforementioned range listed, please consider follow up with your Primary Care Provider.  If you are age 58 or younger, your body mass index should be between 19-25. Your Body mass index is 32.64 kg/m. If this is out of the aformentioned range listed, please consider follow up with your Primary Care Provider.   MY CHART:  The [facility] providers would like to encourage you to use [facility] to communicate with providers for non-urgent requests or questions.  Due to long hold times on the telephone, sending your provider a message by [facility] may be a faster and more efficient way to get a response.  Please allow 48 business hours for a response.  Please remember that this is for non-urgent  requests.   Thank you for trusting me with your gastrointestinal care!

## 2021-03-25 NOTE — Telephone Encounter (Signed)
Request for surgical clearance:     Endoscopy Procedure  What type of surgery is being performed?     Colonoscopy  When is this surgery scheduled?     04/07/21  What type of clearance is required ?   Pharmacy  Are there any medications that need to be held prior to surgery and how long? 5 day hold Plavix  Practice name and name of physician performing surgery?      [facility]  What is your office phone and fax number?      Phone- [phone]  [phone]  Anesthesia type (None, local, MAC, general) ?       MAC

## 2021-03-26 ENCOUNTER — Other Ambulatory Visit: Payer: Self-pay | Admitting: Family Medicine

## 2021-03-26 LAB — IRON AND TIBC
Iron Saturation: 8 % — CL (ref 15–55)
Iron: 33 ug/dL — ABNORMAL LOW (ref 38–169)
Total Iron Binding Capacity: 425 ug/dL (ref 250–450)
UIBC: 392 ug/dL — ABNORMAL HIGH (ref 111–343)

## 2021-03-28 NOTE — Telephone Encounter (Signed)
Refill request for traMADol (ULTRAM) 50 MG tablet  LOV - 01/03/21 Next OV - not scheduled Last refill - 12/03/20 #270/0

## 2021-04-04 ENCOUNTER — Telehealth: Payer: Self-pay | Admitting: Family Medicine

## 2021-04-04 ENCOUNTER — Other Ambulatory Visit (INDEPENDENT_AMBULATORY_CARE_PROVIDER_SITE_OTHER): Payer: Medicare HMO

## 2021-04-04 ENCOUNTER — Other Ambulatory Visit: Payer: Self-pay

## 2021-04-04 DIAGNOSIS — D649 Anemia, unspecified: Secondary | ICD-10-CM | POA: Diagnosis not present

## 2021-04-04 LAB — IRON: Iron: 224 ug/dL — ABNORMAL HIGH (ref 42–165)

## 2021-04-04 NOTE — Telephone Encounter (Signed)
Pt drop off copy of his Covid vaccination card . It was place in PCP mail box ?

## 2021-04-04 NOTE — Telephone Encounter (Signed)
Vaccine record has been updated in EMR.  ?

## 2021-04-07 ENCOUNTER — Encounter: Payer: Medicare HMO | Admitting: Gastroenterology

## 2021-04-10 ENCOUNTER — Other Ambulatory Visit: Payer: Self-pay | Admitting: Family Medicine

## 2021-04-10 DIAGNOSIS — Z794 Long term (current) use of insulin: Secondary | ICD-10-CM | POA: Diagnosis not present

## 2021-04-10 DIAGNOSIS — E119 Type 2 diabetes mellitus without complications: Secondary | ICD-10-CM | POA: Diagnosis not present

## 2021-04-10 MED ORDER — IRON (FERROUS SULFATE) 325 (65 FE) MG PO TABS: 325.0000 mg | ORAL_TABLET | ORAL | Status: DC

## 2021-04-12 DIAGNOSIS — I10 Essential (primary) hypertension: Secondary | ICD-10-CM | POA: Diagnosis not present

## 2021-04-12 DIAGNOSIS — Z008 Encounter for other general examination: Secondary | ICD-10-CM | POA: Diagnosis not present

## 2021-04-12 DIAGNOSIS — I2510 Atherosclerotic heart disease of native coronary artery without angina pectoris: Secondary | ICD-10-CM | POA: Diagnosis not present

## 2021-04-12 DIAGNOSIS — E669 Obesity, unspecified: Secondary | ICD-10-CM | POA: Diagnosis not present

## 2021-04-12 DIAGNOSIS — G8929 Other chronic pain: Secondary | ICD-10-CM | POA: Diagnosis not present

## 2021-04-12 DIAGNOSIS — H409 Unspecified glaucoma: Secondary | ICD-10-CM | POA: Diagnosis not present

## 2021-04-12 DIAGNOSIS — Z6830 Body mass index (BMI) 30.0-30.9, adult: Secondary | ICD-10-CM | POA: Diagnosis not present

## 2021-04-12 DIAGNOSIS — E785 Hyperlipidemia, unspecified: Secondary | ICD-10-CM | POA: Diagnosis not present

## 2021-04-12 DIAGNOSIS — H547 Unspecified visual loss: Secondary | ICD-10-CM | POA: Diagnosis not present

## 2021-04-12 DIAGNOSIS — E1151 Type 2 diabetes mellitus with diabetic peripheral angiopathy without gangrene: Secondary | ICD-10-CM | POA: Diagnosis not present

## 2021-04-12 DIAGNOSIS — M2550 Pain in unspecified joint: Secondary | ICD-10-CM | POA: Diagnosis not present

## 2021-04-12 DIAGNOSIS — D649 Anemia, unspecified: Secondary | ICD-10-CM | POA: Diagnosis not present

## 2021-04-12 DIAGNOSIS — Z794 Long term (current) use of insulin: Secondary | ICD-10-CM | POA: Diagnosis not present

## 2021-04-18 ENCOUNTER — Ambulatory Visit: Payer: Medicare HMO | Admitting: Gastroenterology

## 2021-04-18 ENCOUNTER — Other Ambulatory Visit: Payer: Self-pay

## 2021-04-18 DIAGNOSIS — Z1211 Encounter for screening for malignant neoplasm of colon: Secondary | ICD-10-CM

## 2021-04-18 DIAGNOSIS — D509 Iron deficiency anemia, unspecified: Secondary | ICD-10-CM

## 2021-04-18 MED ORDER — PLENVU 140 G PO SOLR: 1.0000 | Freq: Once | ORAL | 0 refills | Status: AC

## 2021-04-18 NOTE — Progress Notes (Signed)
Pt states he took plavix last on 05/18/21.  Pt was to hold plavix for 5 days, per Dr Loletha Carrow, r/s pt to next avail appt.  Let pt know we will have to r/s his endocolon.  Reviewed instrucxtions with pt and wife, reviewed holding iron and plavix on the same day.  Pt and wife verb understanding, sample given to pt ?

## 2021-04-19 DIAGNOSIS — H401131 Primary open-angle glaucoma, bilateral, mild stage: Secondary | ICD-10-CM | POA: Diagnosis not present

## 2021-04-26 ENCOUNTER — Ambulatory Visit (INDEPENDENT_AMBULATORY_CARE_PROVIDER_SITE_OTHER): Payer: Medicare HMO | Admitting: Endocrinology

## 2021-04-26 ENCOUNTER — Other Ambulatory Visit: Payer: Self-pay

## 2021-04-26 VITALS — BP 160/78 | HR 93 | Ht 70.0 in | Wt 209.8 lb

## 2021-04-26 DIAGNOSIS — E11649 Type 2 diabetes mellitus with hypoglycemia without coma: Secondary | ICD-10-CM | POA: Diagnosis not present

## 2021-04-26 LAB — POCT GLYCOSYLATED HEMOGLOBIN (HGB A1C): Hemoglobin A1C: 8.3 % — AB (ref 4.0–5.6)

## 2021-04-26 MED ORDER — OZEMPIC (0.25 OR 0.5 MG/DOSE) 2 MG/1.5ML ~~LOC~~ SOPN: 0.2500 mg | PEN_INJECTOR | SUBCUTANEOUS | 3 refills | Status: DC

## 2021-04-26 MED ORDER — NOVOLIN N FLEXPEN 100 UNIT/ML ~~LOC~~ SUPN: 160.0000 [IU] | PEN_INJECTOR | SUBCUTANEOUS | 3 refills | Status: DC

## 2021-04-26 MED ORDER — FREESTYLE LIBRE 2 READER DEVI
1.0000 | Freq: Once | 1 refills | Status: AC
Start: 2021-04-26 — End: 2021-04-26

## 2021-04-26 NOTE — Progress Notes (Signed)
? ?Subjective:  ? ? Patient ID: Anthony Cuevas, male    DOB: 1950-09-14, 71 y.o.   MRN: [id number] ? ?HPI ?Pt returns for f/u of diabetes mellitus:  ?DM type: Insulin-requiring type 2.   ?Dx'ed: 1992 ?Complications: CAD and PAD.   ?Therapy: insulin since 2006, and metformin.   ?DKA: never ?Severe hypoglycemia: never.   ?Pancreatitis: never.   ?Pancreatic imaging: never.   ?SDOH: he takes human insulin, due to cost.   ?Other: he stopped pioglitizone, due to edema; he takes BID insulin, after poor results with multiple daily injections; he eats meals at 9AM, 2PM, and 7PM.    ?Interval history: continuous glucose monitor data are reviewed.  glucose varies from 55-340.  It is in general highest at 12MN. It decreases until 4AM, then increases until 10AM.  It then decreases until 5-7PM.  it then re-increases until 11PM.  He has mild hypoglycemia approx QOD. This happens in the afternoon, with exercise, or if he misses lunch.  Pt says he never misses the insulin.   ?He takes the R insulin at HS rather than with supper. ?Past Medical History:  ?Diagnosis Date  ? Arthritis   ? Coronary atherosclerosis   ? a. 07/2018 CT Chest: 3 vessel coronary atherosclerosis; b. 07/2018 MV: EF 46% (GI uptake artifact), No ischemia/infarct. Mild to mod diffuse Ao atherosclerosis and at least moderate 2 vessel CAD (LAD/RCA) on CT imaging. Low risk.  ? Depression   ? improved on sertraline  ? Diabetes mellitus, type 2 ([place]) 09/1996  ? GERD (gastroesophageal reflux disease) 1990  ? History of peptic ulcer disease   ? Hyperlipidemia 1992  ? Hypertension 05/2003  ? Lumbar stenosis L2-3  ? Muscle atrophy   ? in arms from degenerative changes in neck  ? Neck pain   ? chronic  ? Smoker   ? Wears glasses   ? ? ?Past Surgical History:  ?Procedure Laterality Date  ? ABDOMINAL AORTOGRAM W/LOWER EXTREMITY N/A 09/03/2019  ? Procedure: ABDOMINAL AORTOGRAM W/LOWER EXTREMITY;  Surgeon: Wellington Hampshire, MD;  Location: [facility];  Service:  Cardiovascular;  Laterality: N/A;  Limited Study  ? CERVICAL DISC SURGERY  04/19/2002  ? fusion, Dr. Lorin Mercy  ? CERVICAL DISCECTOMY  01/31/1995  ? partial  ? CERVICAL DISCECTOMY  01/30/1998  ? COLONOSCOPY WITH ESOPHAGOGASTRODUODENOSCOPY (EGD)  06/25/2015  ? HEMORRHOID SURGERY  01/31/1987  ? KNEE ARTHROPLASTY  11/20/2011  ? Procedure: COMPUTER ASSISTED TOTAL KNEE ARTHROPLASTY;  Surgeon: Marybelle Killings, MD;  Location: [facility];  Service: Orthopedics;  Laterality: Left;  Conversion Left Knee Medial Uni to Total Knee Arthroplasty-Cemented  ? LUMBAR LAMINECTOMY/DECOMPRESSION MICRODISCECTOMY N/A 04/28/2015  ? Procedure: L2-3 Decompression;  Surgeon: Marybelle Killings, MD;  Location: [facility];  Service: Orthopedics;  Laterality: N/A;  ? MR MRA DUPLICATE EXAM  INACTIVATE    ? MULTIPLE TOOTH EXTRACTIONS    ? Neisen funduplication  85/63/1497  ? PERIPHERAL VASCULAR INTERVENTION  09/03/2019  ? Procedure: PERIPHERAL VASCULAR INTERVENTION;  Surgeon: Wellington Hampshire, MD;  Location: [facility];  Service: Cardiovascular;;  Iliac Stent  ? REPLACEMENT TOTAL KNEE Left 02/14/02, 05/04/02  ? left- partial  ? SCC EXCISION  04/25/2001  ? Dr. March Rummage  ? STOMACH SURGERY  01/30/1982  ? PUD, HH  ? ? ?Social History  ? ?Socioeconomic History  ? Marital status: Married  ?  Spouse name: Not on file  ? Number of children: 3  ? Years of education: Not on file  ?  Highest education level: Not on file  ?Occupational History  ? Occupation: Training and development officer, retired  ?  Comment: airline repair  ?Tobacco Use  ? Smoking status: Former  ?  Packs/day: 1.00  ?  Years: 41.00  ?  Pack years: 41.00  ?  Types: Cigarettes  ?  Quit date: 2014  ?  Years since quitting: 9.2  ? Smokeless tobacco: Never  ?Vaping Use  ? Vaping Use: Never used  ?Substance and Sexual Activity  ? Alcohol use: No  ?  Alcohol/week: 0.0 standard drinks  ? Drug use: No  ? Sexual activity: Never  ?Other Topics Concern  ? Not on file  ?Social History Narrative  ? 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 Married 1975  ? 3 kids  ? ?Social Determinants of Health  ? ?Financial Resource Strain: Low Risk   ? Difficulty of Paying Living Expenses: Not hard at all  ?Food Insecurity: No Food Insecurity  ? Worried About Charity fundraiser in the Last Year: Never true  ? Ran Out of Food in the Last Year: Never true  ?Transportation Needs: No Transportation Needs  ? Lack of Transportation (Medical): No  ? Lack of Transportation (Non-Medical): No  ?Physical Activity: Insufficiently Active  ? Days of Exercise per Week: 7 days  ? Minutes of Exercise per Session: 10 min  ?Stress: No Stress Concern Present  ? Feeling of Stress : Not at all  ?Social Connections: Moderately Isolated  ? Frequency of Communication with Friends and Family: More than three times a week  ? Frequency of Social Gatherings with Friends and Family: Once a week  ? Attends Religious Services: Never  ? Active Member of Clubs or Organizations: No  ? Attends Archivist Meetings: Never  ? Marital Status: Married  ?Intimate Partner Violence: Not At Risk  ? Fear of Current or Ex-Partner: No  ? Emotionally Abused: No  ? Physically Abused: No  ? Sexually Abused: No  ? ? ?Current Outpatient Medications on File Prior to Visit  ?Medication Sig Dispense Refill  ? Cholecalciferol (VITAMIN D3) 125 MCG (5000 UT) CAPS Take 5,000 Units by mouth 2 (two) times a week.    ? clopidogrel (PLAVIX) 75 MG tablet TAKE 1 TABLET DAILY 90 tablet 2  ? Continuous Blood Gluc Sensor (FREESTYLE LIBRE 14 DAY SENSOR) MISC 1 Device by Does not apply route every 14 (fourteen) days. 6 each 3  ? cyanocobalamin 1000 MCG tablet Take 1,000 mcg by mouth daily.    ? cyclobenzaprine (FLEXERIL) 10 MG tablet TAKE 1 TABLET TWICE A DAY AS NEEDED FOR MUSCLE SPASMS (SEDATION CAUTION) 180 tablet 1  ? Insulin Regular Human (NOVOLIN R FLEXPEN RELION) 100 UNIT/ML KwikPen Inject 25 Units as directed daily with  supper. 30 mL 3  ? Iron, Ferrous Sulfate, 325 (65 Fe) MG TABS Take 325 mg by mouth every other day.    ? latanoprost (XALATAN) 0.005 % ophthalmic solution 1 drop at bedtime.    ? lisinopril (ZESTRIL) 40 MG tablet TAKE 1 TABLET DAILY 90 tablet 3  ? metFORMIN (GLUCOPHAGE) 1000 MG tablet TAKE 1 TABLET TWICE A DAY WITH MEALS 180 tablet 3  ? Omega-3 Fatty Acids (FISH OIL PO) Take 1,000 mg by mouth once a week.    ? ONETOUCH ULTRA test strip USE AS INSTRUCTED TO TEST BLOOD SUGARS 3 OR 4 TIMES DAILY 100 each 11  ?  SURE COMFORT PEN NEEDLES 32G X 4 MM MISC USE THREE TIMES A DAY 100 each 10  ? traMADol (ULTRAM) 50 MG tablet TAKE 1 TABLET EVERY 8 HOURS AS NEEDED FOR CHRONIC JOINT/BACK PAIN (SEDATION CAUTION) 270 tablet 1  ? rosuvastatin (CRESTOR) 20 MG tablet TAKE 1 TABLET DAILY (Patient not taking: Reported on 03/25/2021) 90 tablet 1  ? ?No current facility-administered medications on file prior to visit.  ? ? ?Allergies  ?Allergen Reactions  ? Tizanidine   ?  Drowsy with med use.  Tolerates flexeril  ? ? ?Family History  ?Problem Relation Age of Onset  ? Diabetes Mother   ? Hypertension Mother   ? Stroke Mother   ? Diabetes Father   ? Diabetes Sister   ? Diabetes Brother   ? Diabetes Brother   ? Heart disease Brother   ? Breast cancer Maternal Grandmother   ? Lung disease Paternal Grandfather   ?     black lung  ? Heart disease Son   ? Arthritis Neg Hx   ? Prostate cancer Neg Hx   ? Colon cancer Neg Hx   ? Stomach cancer Neg Hx   ? ? ?BP (!) 160/78   Pulse 93   Ht '5\' 10"'$  (1.778 m)   Wt 209 lb 12.8 oz (95.2 kg)   SpO2 98%   BMI 30.10 kg/m?  ? ?Review of Systems ? ?   ?Objective:  ? Physical Exam ?VITAL SIGNS:  See vs page ?GENERAL: no distress ?Pulses: dorsalis pedis intact bilat.   ?MSK: no deformity of the feet ?CV: trace bilat leg edema ?Skin:  no ulcer on the feet, but there are heavy calluses  normal color and temp on the feet. ?Neuro: sensation is intact to touch on the feet, but decreased from normal.  ?Ext: there  is bilateral onychomycosis of the toenails.  ? ? ?Lab Results  ?Component Value Date  ? HGBA1C 8.3 (A) 04/26/2021  ? ?Lab Results  ?Component Value Date  ? CREATININE 1.05 01/03/2021  ? BUN 26 (H) 01/03/2021  ? NA 139 12/

## 2021-04-26 NOTE — Patient Instructions (Addendum)
check your blood sugar 4 times a day.  vary the time of day when you check, between before the 3 meals, and at bedtime.  also check if you have symptoms of your blood sugar being too high or too low.  please keep a record of the readings and bring it to your next appointment here (or you can bring the meter itself).  You can write it on any piece of paper.  please call us sooner if your blood sugar goes below 70, or if you have a lot of readings over 200.   ?Please change the R insulin to just before supper.  ?I have sent a prescription to your pharmacy, to add Ozempic.   ?Please change the N insulin to 160 units each morning.  However, if you are going to be active that day, take just 140 units that morning.  If you are unable to anticipate the meal, eat a light snack with the exertion.   ?Please come back for a follow-up appointment in 1 month.   ? ?

## 2021-05-03 ENCOUNTER — Telehealth: Payer: Self-pay

## 2021-05-03 NOTE — Telephone Encounter (Signed)
Incoming call from patient stating that his Ozempic requires a prior authorization, are you all able to start this? Thanks! ?

## 2021-05-04 ENCOUNTER — Other Ambulatory Visit (HOSPITAL_COMMUNITY): Payer: Self-pay

## 2021-05-04 ENCOUNTER — Telehealth: Payer: Self-pay | Admitting: Pharmacy Technician

## 2021-05-04 NOTE — Telephone Encounter (Signed)
Patient Advocate Encounter ? ?Received notification from [place] that prior authorization for [facility] '2MG'$  is required. ?  ?PA submitted on 4.5.23 ?Key [id number] ?Status is pending ?  ?[facility] will continue to follow ? ?Latayna Ritchie R Rastus Borton, CPhT ?Patient Advocate ?[facility] ?Phone: [phone] ?Fax:  [phone] ? ?

## 2021-05-04 NOTE — Telephone Encounter (Signed)
PA is in process. Will continue to follow

## 2021-05-05 ENCOUNTER — Encounter: Payer: Self-pay | Admitting: Gastroenterology

## 2021-05-10 DIAGNOSIS — E119 Type 2 diabetes mellitus without complications: Secondary | ICD-10-CM | POA: Diagnosis not present

## 2021-05-10 DIAGNOSIS — Z794 Long term (current) use of insulin: Secondary | ICD-10-CM | POA: Diagnosis not present

## 2021-05-17 ENCOUNTER — Ambulatory Visit (AMBULATORY_SURGERY_CENTER): Payer: Medicare HMO | Admitting: Gastroenterology

## 2021-05-17 ENCOUNTER — Encounter: Payer: Self-pay | Admitting: Gastroenterology

## 2021-05-17 VITALS — BP 125/71 | HR 85 | Temp 98.9°F | Resp 17 | Ht 70.0 in | Wt 211.0 lb

## 2021-05-17 DIAGNOSIS — D509 Iron deficiency anemia, unspecified: Secondary | ICD-10-CM

## 2021-05-17 DIAGNOSIS — I10 Essential (primary) hypertension: Secondary | ICD-10-CM | POA: Diagnosis not present

## 2021-05-17 DIAGNOSIS — E785 Hyperlipidemia, unspecified: Secondary | ICD-10-CM | POA: Diagnosis not present

## 2021-05-17 DIAGNOSIS — K2950 Unspecified chronic gastritis without bleeding: Secondary | ICD-10-CM | POA: Diagnosis not present

## 2021-05-17 DIAGNOSIS — D125 Benign neoplasm of sigmoid colon: Secondary | ICD-10-CM

## 2021-05-17 DIAGNOSIS — R69 Illness, unspecified: Secondary | ICD-10-CM | POA: Diagnosis not present

## 2021-05-17 DIAGNOSIS — K2940 Chronic atrophic gastritis without bleeding: Secondary | ICD-10-CM

## 2021-05-17 DIAGNOSIS — I2510 Atherosclerotic heart disease of native coronary artery without angina pectoris: Secondary | ICD-10-CM | POA: Diagnosis not present

## 2021-05-17 DIAGNOSIS — K31819 Angiodysplasia of stomach and duodenum without bleeding: Secondary | ICD-10-CM

## 2021-05-17 MED ORDER — SODIUM CHLORIDE 0.9 % IV SOLN: 500.0000 mL | Freq: Once | INTRAVENOUS | Status: DC

## 2021-05-17 NOTE — Progress Notes (Signed)
VSS, transported to PACU °

## 2021-05-17 NOTE — Progress Notes (Signed)
History and Physical: ? This patient presents for endoscopic testing for: ?Encounter Diagnosis  ?Name Primary?  ? Iron deficiency anemia, unspecified iron deficiency anemia type Yes  ? ? ?Clinical details in 03/25/21 [facility] office consult note. ?IDA, last endoscopic procedures 2017 ?Has been off plavix 5 days ?ROS: ?Patient denies chest pain or shortness of breath ? ? ?Past Medical History: ?Past Medical History:  ?Diagnosis Date  ? Arthritis   ? Coronary atherosclerosis   ? a. 07/2018 CT Chest: 3 vessel coronary atherosclerosis; b. 07/2018 MV: EF 46% (GI uptake artifact), No ischemia/infarct. Mild to mod diffuse Ao atherosclerosis and at least moderate 2 vessel CAD (LAD/RCA) on CT imaging. Low risk.  ? Depression   ? improved on sertraline  ? Diabetes mellitus, type 2 ([place]) 09/1996  ? GERD (gastroesophageal reflux disease) 1990  ? History of peptic ulcer disease   ? Hyperlipidemia 1992  ? Hypertension 05/2003  ? Lumbar stenosis L2-3  ? Muscle atrophy   ? in arms from degenerative changes in neck  ? Neck pain   ? chronic  ? Smoker   ? Wears glasses   ? ? ? ?Past Surgical History: ?Past Surgical History:  ?Procedure Laterality Date  ? ABDOMINAL AORTOGRAM W/LOWER EXTREMITY N/A 09/03/2019  ? Procedure: ABDOMINAL AORTOGRAM W/LOWER EXTREMITY;  Surgeon: Wellington Hampshire, MD;  Location: [facility];  Service: Cardiovascular;  Laterality: N/A;  Limited Study  ? CERVICAL DISC SURGERY  04/19/2002  ? fusion, Dr. Lorin Mercy  ? CERVICAL DISCECTOMY  01/31/1995  ? partial  ? CERVICAL DISCECTOMY  01/30/1998  ? COLONOSCOPY WITH ESOPHAGOGASTRODUODENOSCOPY (EGD)  06/25/2015  ? HEMORRHOID SURGERY  01/31/1987  ? KNEE ARTHROPLASTY  11/20/2011  ? Procedure: COMPUTER ASSISTED TOTAL KNEE ARTHROPLASTY;  Surgeon: Marybelle Killings, MD;  Location: [facility];  Service: Orthopedics;  Laterality: Left;  Conversion Left Knee Medial Uni to Total Knee Arthroplasty-Cemented  ? LUMBAR LAMINECTOMY/DECOMPRESSION MICRODISCECTOMY N/A 04/28/2015  ? Procedure: L2-3  Decompression;  Surgeon: Marybelle Killings, MD;  Location: [facility];  Service: Orthopedics;  Laterality: N/A;  ? MR MRA DUPLICATE EXAM  INACTIVATE    ? MULTIPLE TOOTH EXTRACTIONS    ? Neisen funduplication  93/79/0240  ? PERIPHERAL VASCULAR INTERVENTION  09/03/2019  ? Procedure: PERIPHERAL VASCULAR INTERVENTION;  Surgeon: Wellington Hampshire, MD;  Location: [facility];  Service: Cardiovascular;;  Iliac Stent  ? REPLACEMENT TOTAL KNEE Left 02/14/02, 05/04/02  ? left- partial  ? SCC EXCISION  04/25/2001  ? Dr. March Rummage  ? STOMACH SURGERY  01/30/1982  ? PUD, HH  ? ? ?Allergies: ?Allergies  ?Allergen Reactions  ? Tizanidine   ?  Drowsy with med use.  Tolerates flexeril  ? ? ?Outpatient Meds: ?Current Outpatient Medications  ?Medication Sig Dispense Refill  ? Cholecalciferol (VITAMIN D3) 125 MCG (5000 UT) CAPS Take 5,000 Units by mouth 2 (two) times a week.    ? Continuous Blood Gluc Sensor (FREESTYLE LIBRE 14 DAY SENSOR) MISC 1 Device by Does not apply route every 14 (fourteen) days. 6 each 3  ? cyanocobalamin 1000 MCG tablet Take 1,000 mcg by mouth daily.    ? cyclobenzaprine (FLEXERIL) 10 MG tablet TAKE 1 TABLET TWICE A DAY AS NEEDED FOR MUSCLE SPASMS (SEDATION CAUTION) 180 tablet 1  ? Insulin NPH, Human,, Isophane, (NOVOLIN N FLEXPEN) 100 UNIT/ML Kiwkpen Inject 160 Units into the skin every morning. and pen needles 3/day. 165 mL 3  ? latanoprost (XALATAN) 0.005 % ophthalmic solution 1 drop at bedtime.    ?  lisinopril (ZESTRIL) 40 MG tablet TAKE 1 TABLET DAILY 90 tablet 3  ? metFORMIN (GLUCOPHAGE) 1000 MG tablet TAKE 1 TABLET TWICE A DAY WITH MEALS 180 tablet 3  ? Omega-3 Fatty Acids (FISH OIL PO) Take 1,000 mg by mouth once a week.    ? ONETOUCH ULTRA test strip USE AS INSTRUCTED TO TEST BLOOD SUGARS 3 OR 4 TIMES DAILY 100 each 11  ? traMADol (ULTRAM) 50 MG tablet TAKE 1 TABLET EVERY 8 HOURS AS NEEDED FOR CHRONIC JOINT/BACK PAIN (SEDATION CAUTION) 270 tablet 1  ? clopidogrel (PLAVIX) 75 MG tablet TAKE 1 TABLET DAILY 90  tablet 2  ? Insulin Regular Human (NOVOLIN R FLEXPEN RELION) 100 UNIT/ML KwikPen Inject 25 Units as directed daily with supper. 30 mL 3  ? Iron, Ferrous Sulfate, 325 (65 Fe) MG TABS Take 325 mg by mouth every other day.    ? rosuvastatin (CRESTOR) 20 MG tablet TAKE 1 TABLET DAILY (Patient not taking: Reported on 03/25/2021) 90 tablet 1  ? Semaglutide,0.25 or 0.'5MG'$ /DOS, (OZEMPIC, 0.25 OR 0.5 MG/DOSE,) 2 MG/1.5ML SOPN Inject 0.25 mg into the skin once a week. (Patient not taking: Reported on 05/17/2021) 4.5 mL 3  ? SURE COMFORT PEN NEEDLES 32G X 4 MM MISC USE THREE TIMES A DAY 100 each 10  ? ?Current Facility-Administered Medications  ?Medication Dose Route Frequency Provider Last Rate Last Admin  ? 0.9 %  sodium chloride infusion  500 mL Intravenous Once Doran Stabler, MD      ? ? ? ? ?___________________________________________________________________ ?Objective  ? ?Exam: ? ?BP (!) 150/66   Pulse 92   Temp 98.9 ?F (37.2 ?C) (Temporal)   Ht '5\' 10"'$  (1.778 m)   Wt 211 lb (95.7 kg)   SpO2 100%   BMI 30.28 kg/m?  ? ?CV: RRR without murmur, S1/S2 ?Resp: clear to auscultation bilaterally, normal RR and effort noted ?GI: soft, no tenderness, with active bowel sounds. ? ? ?Assessment: ?Encounter Diagnosis  ?Name Primary?  ? Iron deficiency anemia, unspecified iron deficiency anemia type Yes  ?CAD ?CHF ?Long term use antiplatelet agent ? ? ?Plan: ?Colonoscopy ?EGD ? ? ?The patient is appropriate for an endoscopic procedure in the ambulatory setting. ? ? - Wilfrid Lund, MD ? ? ? ? ?

## 2021-05-17 NOTE — Op Note (Signed)
[facility] ?Patient Name: Anthony Cuevas ?Procedure Date: 05/17/2021 3:34 PM ?MRN: [id number] ?Endoscopist: Estill Cotta. Loletha Carrow , MD ?Age: 71 ?Referring MD:  ?Date of Birth: 11-01-1950 ?Gender: Male ?Account #: [id number] ?Procedure:                Colonoscopy ?Indications:              Iron deficiency anemia secondary to chronic blood  ?                          loss ?                          (also had colon polyps in 2017) ?Medicines:                Monitored Anesthesia Care ?Procedure:                Pre-Anesthesia Assessment: ?                          - Prior to the procedure, a History and Physical  ?                          was performed, and patient medications and  ?                          allergies were reviewed. The patient's tolerance of  ?                          previous anesthesia was also reviewed. The risks  ?                          and benefits of the procedure and the sedation  ?                          options and risks were discussed with the patient.  ?                          All questions were answered, and informed consent  ?                          was obtained. Prior Anticoagulants: The patient has  ?                          taken Plavix (clopidogrel), last dose was 5 days  ?                          prior to procedure. ASA Grade Assessment: III - A  ?                          patient with severe systemic disease. After  ?                          reviewing the risks and benefits, the patient was  ?  deemed in satisfactory condition to undergo the  ?                          procedure. ?                          After obtaining informed consent, the colonoscope  ?                          was passed under direct vision. Throughout the  ?                          procedure, the patient's blood pressure, pulse, and  ?                          oxygen saturations were monitored continuously. The  ?                          CF [id number] #[id number] was  introduced through the anus  ?                          and advanced to the the cecum, identified by  ?                          appendiceal orifice and ileocecal valve. The  ?                          colonoscopy was performed without difficulty. The  ?                          patient tolerated the procedure well. The quality  ?                          of the bowel preparation was good after lavage. The  ?                          ileocecal valve, appendiceal orifice, and rectum  ?                          were photographed. ?Scope In: 3:38:54 PM ?Scope Out: 3:53:11 PM ?Scope Withdrawal Time: 0 hours 11 minutes 32 seconds  ?Total Procedure Duration: 0 hours 14 minutes 17 seconds  ?Findings:                 The perianal and digital rectal examinations were  ?                          normal. ?                          A diminutive polyp was found in the sigmoid colon.  ?                          The polyp was semi-sessile. The polyp was removed  ?  with a cold snare. Resection and retrieval were  ?                          complete. ?                          The exam was otherwise without abnormality on  ?                          direct and retroflexion views. ?Complications:            No immediate complications. ?Estimated Blood Loss:     Estimated blood loss was minimal. ?Impression:               - One diminutive polyp in the sigmoid colon,  ?                          removed with a cold snare. Resected and retrieved. ?                          - The examination was otherwise normal on direct  ?                          and retroflexion views. ?Recommendation:           - Patient has a contact number available for  ?                          emergencies. The signs and symptoms of potential  ?                          delayed complications were discussed with the  ?                          patient. Return to normal activities tomorrow.  ?                          Written discharge  instructions were provided to the  ?                          patient. ?                          - Resume previous diet. ?                          - Continue present medications. ?                          - Resume Plavix (clopidogrel) at prior dose  ?                          tomorrow. ?                          - Await pathology results. ?                          -  Repeat colonoscopy is recommended for  ?                          surveillance. The colonoscopy date will be  ?                          determined after pathology results from today's  ?                          exam become available for review. ?                          - See the other procedure note for documentation of  ?                          additional recommendations. ?Astryd Pearcy L. Loletha Carrow, MD ?05/17/2021 4:09:27 PM ?This report has been signed electronically. ?

## 2021-05-17 NOTE — Patient Instructions (Signed)
Handout on polyps and gastritis given.  Resume Plavix at prior dose tomorrow.  ? ? ?YOU HAD AN ENDOSCOPIC PROCEDURE TODAY AT [facility]:   Refer to the procedure report that was given to you for any specific questions about what was found during the examination.  If the procedure report does not answer your questions, please call your gastroenterologist to clarify.  If you requested that your care partner not be given the details of your procedure findings, then the procedure report has been included in a sealed envelope for you to review at your convenience later. ? ?YOU SHOULD EXPECT: Some feelings of bloating in the abdomen. Passage of more gas than usual.  Walking can help get rid of the air that was put into your GI tract during the procedure and reduce the bloating. If you had a lower endoscopy (such as a colonoscopy or flexible sigmoidoscopy) you may notice spotting of blood in your stool or on the toilet paper. If you underwent a bowel prep for your procedure, you may not have a normal bowel movement for a few days. ? ?Please Note:  You might notice some irritation and congestion in your nose or some drainage.  This is from the oxygen used during your procedure.  There is no need for concern and it should clear up in a day or so. ? ?SYMPTOMS TO REPORT IMMEDIATELY: ? ?Following lower endoscopy (colonoscopy or flexible sigmoidoscopy): ? Excessive amounts of blood in the stool ? Significant tenderness or worsening of abdominal pains ? Swelling of the abdomen that is new, acute ? Fever of 100?F or higher ? ?Following upper endoscopy (EGD) ? Vomiting of blood or coffee ground material ? New chest pain or pain under the shoulder blades ? Painful or persistently difficult swallowing ? New shortness of breath ? Fever of 100?F or higher ? Black, tarry-looking stools ? ?For urgent or emergent issues, a gastroenterologist can be reached at any hour by calling [phone]. ?Do not use MyChart  messaging for urgent concerns.  ? ? ?DIET:  We do recommend a small meal at first, but then you may proceed to your regular diet.  Drink plenty of fluids but you should avoid alcoholic beverages for 24 hours. ? ?ACTIVITY:  You should plan to take it easy for the rest of today and you should NOT DRIVE or use heavy machinery until tomorrow (because of the sedation medicines used during the test).   ? ?FOLLOW UP: ?Our staff will call the number listed on your records 48-72 hours following your procedure to check on you and address any questions or concerns that you may have regarding the information given to you following your procedure. If we do not reach you, we will leave a message.  We will attempt to reach you two times.  During this call, we will ask if you have developed any symptoms of COVID 19. If you develop any symptoms (ie: fever, flu-like symptoms, shortness of breath, cough etc.) before then, please call [phone].  If you test positive for Covid 19 in the 2 weeks post procedure, please call and report this information to [place].   ? ?If any biopsies were taken you will be contacted by phone or by letter within the next 1-3 weeks.  Please call us at [phone] if you have not heard about the biopsies in 3 weeks.  ? ? ?SIGNATURES/CONFIDENTIALITY: ?You and/or your care partner have signed paperwork which will be entered into your electronic medical  record.  These signatures attest to the fact that that the information above on your After Visit Summary has been reviewed and is understood.  Full responsibility of the confidentiality of this discharge information lies with you and/or your care-partner.  ?

## 2021-05-17 NOTE — Progress Notes (Signed)
Pt's states no medical or surgical changes since previsit or office visit. 

## 2021-05-17 NOTE — Progress Notes (Signed)
Called to room to assist during endoscopic procedure.  Patient ID and intended procedure confirmed with present staff. Received instructions for my participation in the procedure from the performing physician.  

## 2021-05-17 NOTE — Op Note (Signed)
[place] ?Patient Name: Anthony Cuevas ?Procedure Date: 05/17/2021 3:34 PM ?MRN: [id number] ?Endoscopist: Estill Cotta. Loletha Carrow , MD ?Age: 71 ?Referring MD:  ?Date of Birth: 03-23-1950 ?Gender: Male ?Account #: [id number] ?Procedure:                Upper GI endoscopy ?Indications:              Iron deficiency anemia secondary to chronic blood  ?                          loss ?Medicines:                Monitored Anesthesia Care ?Procedure:                Pre-Anesthesia Assessment: ?                          - Prior to the procedure, a History and Physical  ?                          was performed, and patient medications and  ?                          allergies were reviewed. The patient's tolerance of  ?                          previous anesthesia was also reviewed. The risks  ?                          and benefits of the procedure and the sedation  ?                          options and risks were discussed with the patient.  ?                          All questions were answered, and informed consent  ?                          was obtained. Prior Anticoagulants: The patient has  ?                          taken Plavix (clopidogrel), last dose was 5 days  ?                          prior to procedure. ASA Grade Assessment: III - A  ?                          patient with severe systemic disease. After  ?                          reviewing the risks and benefits, the patient was  ?                          deemed in satisfactory condition to undergo the  ?  procedure. ?                          After obtaining informed consent, the endoscope was  ?                          passed under direct vision. Throughout the  ?                          procedure, the patient's blood pressure, pulse, and  ?                          oxygen saturations were monitored continuously. The  ?                          GIF [id number] #[id number] was introduced through the  ?                          mouth, and  advanced to the third part of duodenum.  ?                          The upper GI endoscopy was accomplished without  ?                          difficulty. The patient tolerated the procedure  ?                          well. ?Scope In: ?Scope Out: ?Findings:                 The larynx was normal. ?                          The esophagus was normal. ?                          Atrophic mucosa was found in the gastric antrum.  ?                          Two biopsies were obtained in the gastric body and  ?                          in the gastric antrum with cold forceps for  ?                          histology. ?                          Two small angioectasias with no bleeding were found  ?                          on the greater curvature of the gastric body and on  ?                          the posterior wall of the gastric body. ?  Evidence of a fundoplication was found in the  ?                          gastric fundus. The wrap appeared loose. ?                          The exam of the stomach was otherwise normal. ?                          The examined duodenum was normal. ?Complications:            No immediate complications. ?Estimated Blood Loss:     Estimated blood loss was minimal. ?Impression:               - Normal larynx. ?                          - Normal esophagus. ?                          - Gastric mucosal atrophy. ?                          - Two non-bleeding angioectasias in the stomach. ?                          - A fundoplication was found. The wrap appears  ?                          loose. ?                          - Normal examined duodenum. ?                          - Two biopsies were obtained in the gastric body  ?                          and in the gastric antrum. ?Recommendation:           - Patient has a contact number available for  ?                          emergencies. The signs and symptoms of potential  ?                          delayed  complications were discussed with the  ?                          patient. Return to normal activities tomorrow.  ?                          Written discharge instructions were provided to the  ?                          patient. ?                          -  Resume previous diet. ?                          - Continue present medications. Resume plavix at  ?                          usual dose tomorrow. ?                          - Await pathology results. ?                          - See the other procedure note for documentation of  ?                          additional recommendations. ?                          - To visualize the small bowel, perform video  ?                          capsule endoscopy at appointment to be scheduled.  ?                          Repeat EGD vs enteroscopy (in hospital endoscopy  ?                          dept. for APC of AVMs) pending VCE findings. ?Khamiyah Grefe L. Loletha Carrow, MD ?05/17/2021 4:14:50 PM ?This report has been signed electronically. ?

## 2021-05-18 ENCOUNTER — Telehealth: Payer: Self-pay

## 2021-05-18 DIAGNOSIS — D509 Iron deficiency anemia, unspecified: Secondary | ICD-10-CM

## 2021-05-18 NOTE — Telephone Encounter (Signed)
Per 05/17/21 procedure report - To visualize the small bowel, perform a video capsule endoscopy at appt to be scheduled ? ?Called and spoke with patient to set up VCE appt. Pt has been scheduled for VCE on Wednesday, 06/01/21 at 8:30 am. I have reviewed what the VCE will consist of. Pt is aware that I will send his instructions via MyChart and I will also mail him a copy. Pt verbalized understanding of all information and had no concerns at the end of the call. ? ?Ambulatory referral to GI in epic. ?Instructions sent to pt via MyChart and mailed. ?

## 2021-05-19 ENCOUNTER — Telehealth: Payer: Self-pay | Admitting: *Deleted

## 2021-05-19 NOTE — Telephone Encounter (Signed)
?  Follow up Call- ? ? ?  05/17/2021  ?  2:16 PM  ?Call back number  ?Post procedure Call Back phone  # [phone]  ?Permission to leave phone message Yes  ?  ? ?Patient questions: ? ?Do you have a fever, pain , or abdominal swelling? Yes.   ?Pain Score  0 * ? ?Have you tolerated food without any problems? Yes.   ? ?Have you been able to return to your normal activities? Yes.   ? ?Do you have any questions about your discharge instructions: ?Diet   No. ?Medications  No. ?Follow up visit  No. ? ?Do you have questions or concerns about your Care? Yes.   ? ?Actions: ?* If pain score is 4 or above: ?No action needed, pain <4. ? ? ?

## 2021-05-20 ENCOUNTER — Other Ambulatory Visit: Payer: Self-pay | Admitting: Endocrinology

## 2021-05-20 ENCOUNTER — Other Ambulatory Visit (HOSPITAL_COMMUNITY): Payer: Self-pay

## 2021-05-20 MED ORDER — TRULICITY 0.75 MG/0.5ML ~~LOC~~ SOAJ: 0.7500 mg | SUBCUTANEOUS | 3 refills | Status: DC

## 2021-05-20 NOTE — Telephone Encounter (Signed)
Received a fax regarding Prior Authorization from [place] for [facility]. Authorization has been DENIED because PT HAS NOT TRIED BYDUREON BCise OR TRULICITY. ? ?Test billing was submitted and Bydureon would still require a PA. Trulicity processes with a $38 copay. ? ?

## 2021-05-23 NOTE — Telephone Encounter (Signed)
Attempted to contact the patient to inform him of the denial as well as the alternative being sent. Call couldn't be connected. ?

## 2021-05-26 ENCOUNTER — Encounter: Payer: Self-pay | Admitting: Gastroenterology

## 2021-06-01 ENCOUNTER — Ambulatory Visit (INDEPENDENT_AMBULATORY_CARE_PROVIDER_SITE_OTHER): Payer: Medicare HMO | Admitting: Gastroenterology

## 2021-06-01 ENCOUNTER — Encounter: Payer: Self-pay | Admitting: Gastroenterology

## 2021-06-01 DIAGNOSIS — D500 Iron deficiency anemia secondary to blood loss (chronic): Secondary | ICD-10-CM | POA: Diagnosis not present

## 2021-06-01 NOTE — Progress Notes (Signed)
SN: [id number] ?Exp: 2022-10-12 ?LOT: [id number] ? ?Patient arrived for VCE. Reported the prep went well. This RN explained capsule dietary restrictions for the next few hours. Pt advised to return at 4 pm to return capsule equipment.  Patient verbalized understanding. Opened capsule, ensured capsule was flashing prior to the patient swallowing the capsule. Patient swallowed capsule without difficulty.  Patient told to call the office with any questions and if capsule has not passed after 72 hours. No further questions by the conclusion of the visit.   ?

## 2021-06-01 NOTE — Patient Instructions (Signed)
You may have clear liquids beginning at 10:30 am after ingesting the capsule.    You can have a light lunch at 12:30 pm; sandwich and half bowl of soup.  Return to the office at 4 pm to return the equipment.   Return to you normal diet at 5 pm.   Call [phone] and ask for Takeysha Bonk, RN if you have any questions.  You should pass the capsule in your stool 8-48 hours after ingestion. If you have not passed the capsule, after 72 hours, please contact the office at [phone].    

## 2021-06-06 ENCOUNTER — Telehealth: Payer: Self-pay | Admitting: Gastroenterology

## 2021-06-06 DIAGNOSIS — D500 Iron deficiency anemia secondary to blood loss (chronic): Secondary | ICD-10-CM

## 2021-06-06 DIAGNOSIS — T184XXA Foreign body in colon, initial encounter: Secondary | ICD-10-CM

## 2021-06-06 DIAGNOSIS — K31819 Angiodysplasia of stomach and duodenum without bleeding: Secondary | ICD-10-CM

## 2021-06-06 NOTE — Telephone Encounter (Addendum)
This patient underwent video capsule study for iron deficiency anemia. ? ?Unfortunately, the capsule was retained in the stomach for most of the capsules battery life, thus little of the small bowel was seen. ? ?Given the finding of gastric AVMs on recent EGD, he needs a small bowel enteroscopy with me in my next [place] long block. ?(Please check with scheduling, but I should be able to do this in my June 5 block) ? ?Hold Plavix 5 days before procedure because we will need to cauterize AVMs. ? ?Lastly, if he has not yet seen the capsule past, he needs a KUB this week. ? ?-HD ?

## 2021-06-07 ENCOUNTER — Other Ambulatory Visit: Payer: Self-pay

## 2021-06-07 DIAGNOSIS — D500 Iron deficiency anemia secondary to blood loss (chronic): Secondary | ICD-10-CM

## 2021-06-07 DIAGNOSIS — K31819 Angiodysplasia of stomach and duodenum without bleeding: Secondary | ICD-10-CM

## 2021-06-07 NOTE — Telephone Encounter (Signed)
Called and spoke with patient regarding his capsule endoscopy results and recommendations. Pt is OK with proceeding with small bowel enteroscopy at [facility] on 07/04/21. Pt is aware that I will send his instructions to him via my chart and I will place a copy in the mail. Pt is aware that he will need to hold Plavix 5 days prior to his scheduled procedure. Pt reports that he has not seen the capsule pass. Pt has been advised to come in for KUB this week. He knows that no appt is necessary and he can stop by the x-ray department in the basement of our office at his convenience. Pt verbalized understanding and had no concerns at the end of the call.  ? ?Ambulatory referral to GI in epic. ?Enteroscopy instructions sent via MyChart and mailed today. ?KUB order in epic. ?

## 2021-06-08 ENCOUNTER — Ambulatory Visit (INDEPENDENT_AMBULATORY_CARE_PROVIDER_SITE_OTHER)
Admission: RE | Admit: 2021-06-08 | Discharge: 2021-06-08 | Disposition: A | Discharge status: Home / Self Care | Payer: Medicare HMO | Source: Ambulatory Visit | Attending: Gastroenterology | Admitting: Gastroenterology

## 2021-06-08 DIAGNOSIS — R195 Other fecal abnormalities: Secondary | ICD-10-CM | POA: Diagnosis not present

## 2021-06-08 DIAGNOSIS — T184XXA Foreign body in colon, initial encounter: Secondary | ICD-10-CM | POA: Diagnosis not present

## 2021-06-09 DIAGNOSIS — E119 Type 2 diabetes mellitus without complications: Secondary | ICD-10-CM | POA: Diagnosis not present

## 2021-06-09 DIAGNOSIS — Z794 Long term (current) use of insulin: Secondary | ICD-10-CM | POA: Diagnosis not present

## 2021-06-13 ENCOUNTER — Encounter (HOSPITAL_COMMUNITY): Payer: Self-pay | Admitting: Gastroenterology

## 2021-06-13 IMAGING — DX DG LUMBAR SPINE COMPLETE 4+V
5 series · 5 of 5 positions shown · non-contrast
Comparison: None.

CLINICAL DATA: Low back pain

EXAM:
LUMBAR SPINE - COMPLETE 4+ VIEW

[l-spine ap]
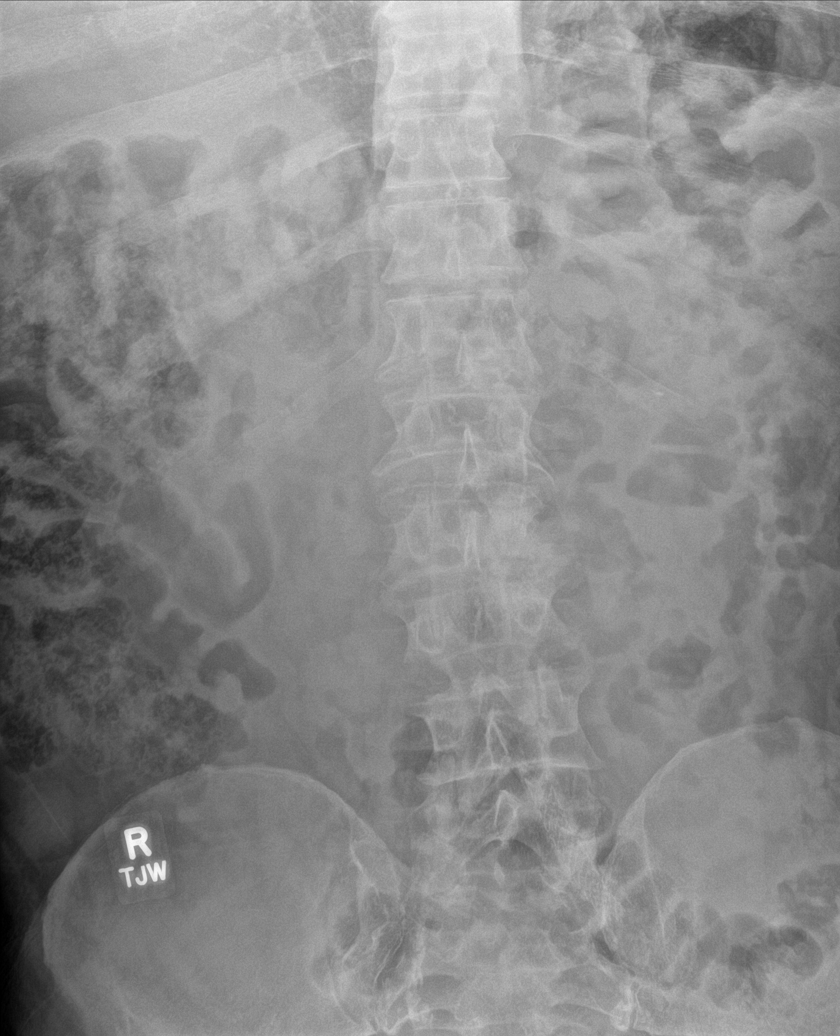

[l-spine obl (1 of 2)]
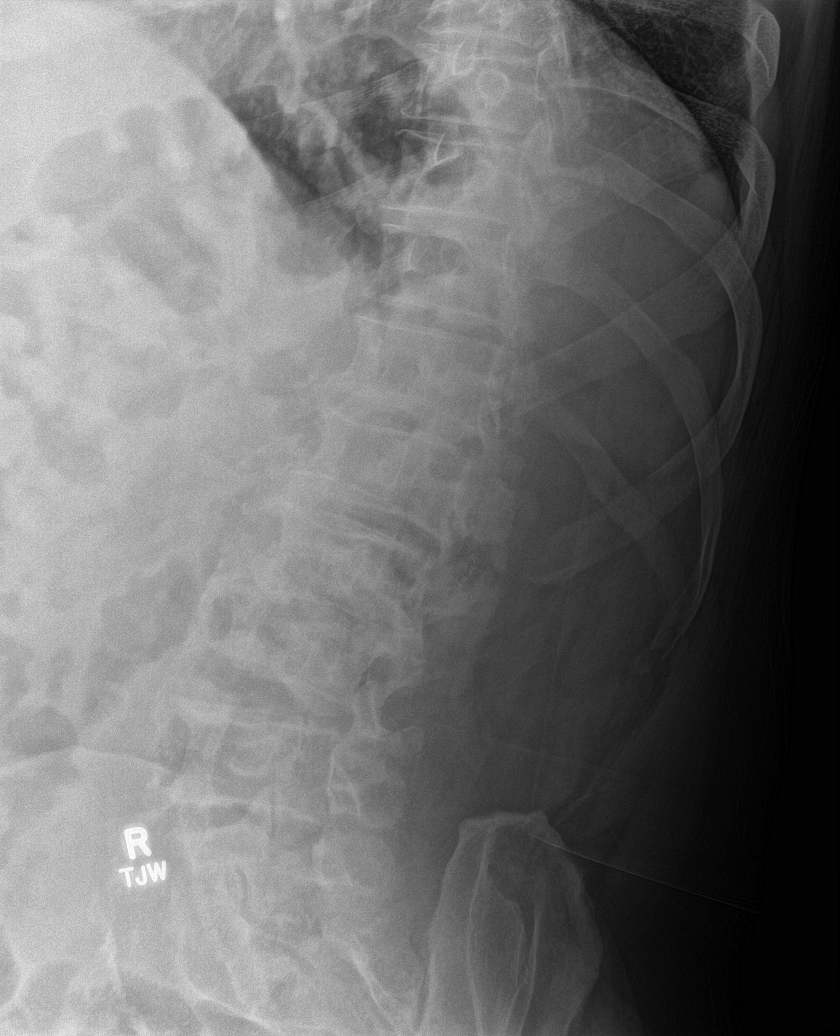

[l-spine obl (2 of 2)]
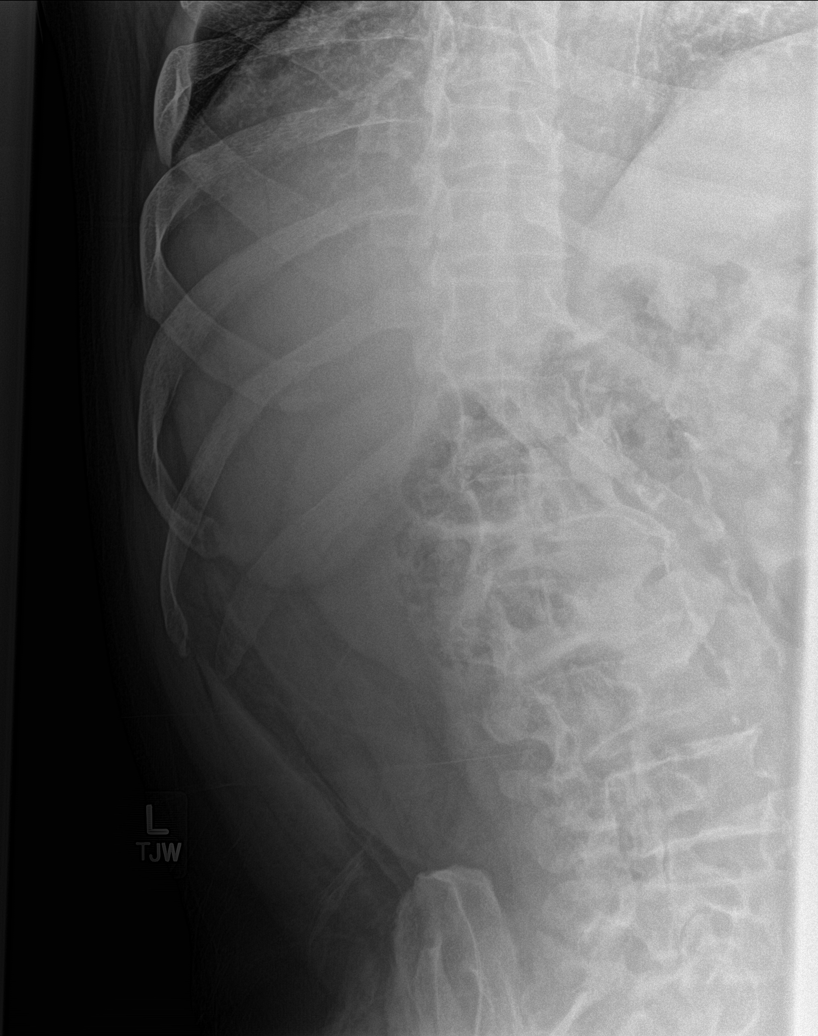

[l-spine lat]
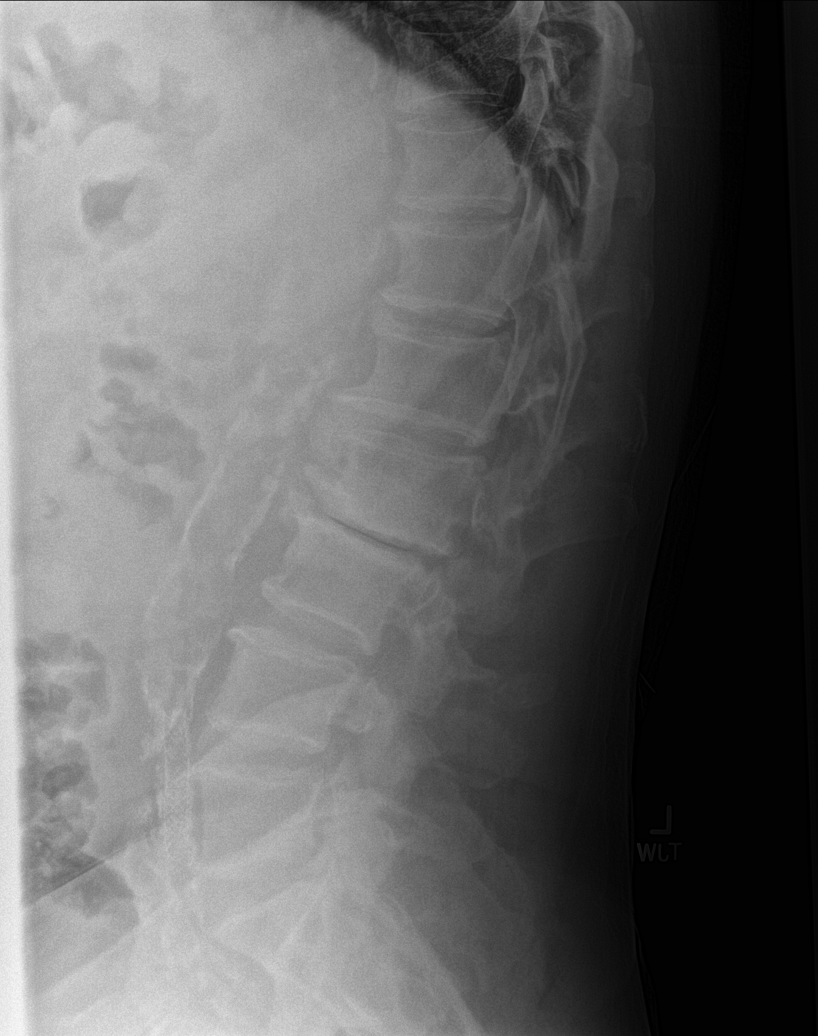

[l-spine l5/s1]
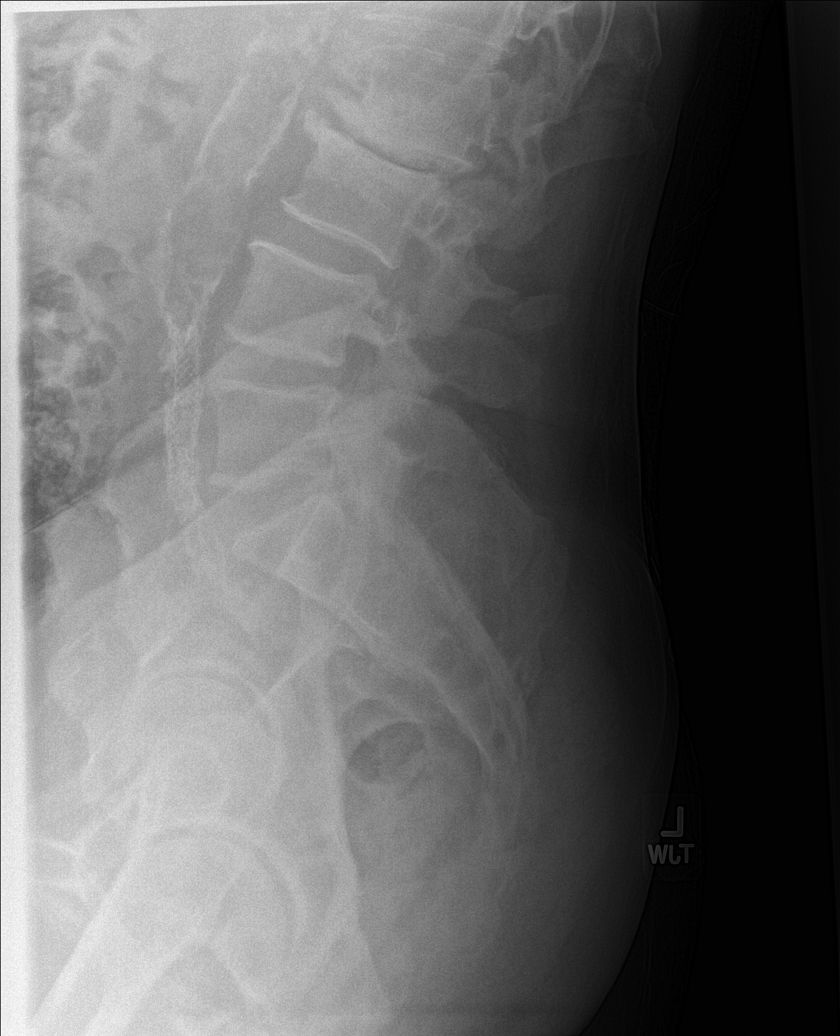

[5 of 5 positions shown; findings below may reference images not displayed]

FINDINGS: There are 5 non rib-bearing lumbar type vertebral bodies.

Mild scoliotic curvature of the thoracolumbar spine measuring
approximately 5 degrees (as measured from the superior endplate of
T11 to the inferior endplate of L4), potentially positional. There
is minimal (approximately 6 mm) of retrolisthesis of L2 upon L3. No
anterolisthesis. No definite pars defects.

Lumbar vertebral body heights appear preserved.

Moderate multilevel lumbar spine DDD, worse at L2-L3 with disc space
height loss, endplate irregularity and sclerosis.

Atherosclerotic plaque within the abdominal aorta. A vascular stent
overlies expected location of the right common iliac artery.

Nonobstructive bowel gas pattern. Regional soft tissues appear
normal.
IMPRESSION: 1. Moderate multilevel lumbar spine DDD, worse at L2-L3.
2.  Aortic Atherosclerosis (MZASN-MH7.7).

## 2021-06-14 ENCOUNTER — Encounter: Payer: Self-pay | Admitting: Podiatry

## 2021-06-14 ENCOUNTER — Ambulatory Visit (INDEPENDENT_AMBULATORY_CARE_PROVIDER_SITE_OTHER): Payer: Medicare HMO | Admitting: Podiatry

## 2021-06-14 DIAGNOSIS — B351 Tinea unguium: Secondary | ICD-10-CM

## 2021-06-14 DIAGNOSIS — E119 Type 2 diabetes mellitus without complications: Secondary | ICD-10-CM

## 2021-06-14 DIAGNOSIS — M79676 Pain in unspecified toe(s): Secondary | ICD-10-CM | POA: Diagnosis not present

## 2021-06-14 DIAGNOSIS — M2010 Hallux valgus (acquired), unspecified foot: Secondary | ICD-10-CM

## 2021-06-14 NOTE — Progress Notes (Signed)
This patient returns to my office for at risk foot care.  This patient requires this care by a professional since this patient will be at risk due to having type 2 diabetes.  This patient is unable to cut nails himself since the patient cannot reach his nails.These nails are painful walking and wearing shoes.  This patient presents for at risk foot care today.  General Appearance  Alert, conversant and in no acute stress.  Vascular  Dorsalis pedis and posterior tibial  pulses are palpable  bilaterally.  Capillary return is within normal limits  bilaterally. Temperature is within normal limits  bilaterally.  Neurologic  Senn-Weinstein monofilament wire test diminished  bilaterally. Muscle power within normal limits bilaterally.  Nails Thick disfigured discolored nails with subungual debris  from hallux to fifth toes bilaterally. No evidence of bacterial infection or drainage bilaterally.  Orthopedic  No limitations of motion  feet .  No crepitus or effusions noted.  No bony pathology or digital deformities noted.  HAV  B/L.  Skin  normotropic skin with no porokeratosis noted bilaterally.  No signs of infections or ulcers noted.     Onychomycosis  Pain in right toes  Pain in left toes  Consent was obtained for treatment procedures.   Mechanical debridement of nails 1-5  bilaterally performed with a nail nipper.  Filed with dremel without incident.    Return office visit    12  weeks                  Told patient to return for periodic foot care and evaluation due to potential at risk complications.   Telly Broberg DPM  

## 2021-06-20 ENCOUNTER — Ambulatory Visit: Payer: Medicare HMO | Admitting: Endocrinology

## 2021-06-21 ENCOUNTER — Other Ambulatory Visit: Payer: Self-pay

## 2021-06-21 DIAGNOSIS — E11649 Type 2 diabetes mellitus with hypoglycemia without coma: Secondary | ICD-10-CM

## 2021-06-21 MED ORDER — TRULICITY 0.75 MG/0.5ML ~~LOC~~ SOAJ: 0.7500 mg | SUBCUTANEOUS | 3 refills | Status: DC

## 2021-06-22 ENCOUNTER — Other Ambulatory Visit: Payer: Self-pay | Admitting: Family Medicine

## 2021-06-22 NOTE — Progress Notes (Signed)
Attempted to obtain medical history via telephone, unable to reach at this time. HIPAA compliant voicemail message left requesting return call to pre surgical testing department. 

## 2021-06-23 ENCOUNTER — Ambulatory Visit: Payer: Medicare HMO

## 2021-06-23 DIAGNOSIS — M2010 Hallux valgus (acquired), unspecified foot: Secondary | ICD-10-CM

## 2021-06-23 DIAGNOSIS — E119 Type 2 diabetes mellitus without complications: Secondary | ICD-10-CM

## 2021-06-23 NOTE — Progress Notes (Signed)
SITUATION Reason for Consult: Evaluation for Prefabricated Diabetic Shoes and Custom Diabetic Inserts. Patient / Caregiver Report: Patient would like well fitting shoes  OBJECTIVE DATA: Patient History / Diagnosis:    ICD-10-CM   1. Diabetes mellitus without complication (HCC)  F75.8     2. Hav (hallux abducto valgus), unspecified laterality  M20.10       Physician Treating Diabetes:  Elayne Snare  Current or Previous Devices:   Previous user  In-Person Foot Examination: Ulcers & Callousing:   none Deformities:    Hallux valgus Sensation:    Intact  Shoe Size:     11W  ORTHOTIC RECOMMENDATION Recommended Devices: - 1x pair prefabricated PDAC approved diabetic shoes; Patient Selected Apex [id number] Size 11W - 3x pair custom-to-patient PDAC approved vacuum formed diabetic insoles.  GOALS OF SHOES AND INSOLES - Reduce shear and pressure - Reduce / Prevent callus formation - Reduce / Prevent ulceration - Protect the fragile healing compromised diabetic foot.  Patient would benefit from diabetic shoes and inserts as patient has diabetes mellitus and the patient has one or more of the following conditions: - History of partial or complete amputation of the foot - History of previous foot ulceration. - History of pre-ulcerative callus - Peripheral neuropathy with evidence of callus formation - Foot deformity - Poor circulation  ACTIONS PERFORMED Potential out of pocket cost was communicated to patient. Patient understood and consented to measurement and casting. Patient was casted for insoles via crush box and measured for shoes via brannock device. Procedure was explained and patient tolerated procedure well. All questions were answered and concerns addressed. CMN Sent to treating physician. Casts were shipped to central fabrication for HOLD until Certificate of Medical Necessity or otherwise necessary authorization from insurance is obtained.  PLAN Shoes are to be ordered and  casts released from hold once all appropriate paperwork is complete. Patient is to be contacted and scheduled for fitting once shoes and insoles have been fabricated and received.

## 2021-06-30 ENCOUNTER — Telehealth: Payer: Self-pay

## 2021-06-30 NOTE — Telephone Encounter (Signed)
Called and spoke with patient to confirm his appt at [facility] on 07/04/21. Pt is aware that he will need to arrive at [facility] by 10 am with a care partner. Pt has been holding his Plavix, he is aware that Dr. Loletha Carrow will let him know when to resume after his procedure. Pt confirmed that he has his instructions. Pt had no concerns at the end of the call.

## 2021-07-04 ENCOUNTER — Other Ambulatory Visit: Payer: Self-pay

## 2021-07-04 ENCOUNTER — Ambulatory Visit (HOSPITAL_COMMUNITY): Payer: Medicare HMO | Admitting: Anesthesiology

## 2021-07-04 ENCOUNTER — Ambulatory Visit (HOSPITAL_COMMUNITY)
Admission: RE | Admit: 2021-07-04 | Discharge: 2021-07-04 | Disposition: A | Discharge status: Home / Self Care | Payer: Medicare HMO | Attending: Gastroenterology | Admitting: Gastroenterology

## 2021-07-04 ENCOUNTER — Encounter (HOSPITAL_COMMUNITY)
Admission: RE | Disposition: A | Discharge status: Home / Self Care | Payer: Self-pay | Source: Home / Self Care | Attending: Gastroenterology

## 2021-07-04 ENCOUNTER — Ambulatory Visit (HOSPITAL_BASED_OUTPATIENT_CLINIC_OR_DEPARTMENT_OTHER): Payer: Medicare HMO | Admitting: Anesthesiology

## 2021-07-04 ENCOUNTER — Encounter (HOSPITAL_COMMUNITY): Payer: Self-pay | Admitting: Gastroenterology

## 2021-07-04 DIAGNOSIS — I2510 Atherosclerotic heart disease of native coronary artery without angina pectoris: Secondary | ICD-10-CM | POA: Insufficient documentation

## 2021-07-04 DIAGNOSIS — Z87891 Personal history of nicotine dependence: Secondary | ICD-10-CM | POA: Insufficient documentation

## 2021-07-04 DIAGNOSIS — M1990 Unspecified osteoarthritis, unspecified site: Secondary | ICD-10-CM | POA: Insufficient documentation

## 2021-07-04 DIAGNOSIS — Z9889 Other specified postprocedural states: Secondary | ICD-10-CM | POA: Diagnosis not present

## 2021-07-04 DIAGNOSIS — D500 Iron deficiency anemia secondary to blood loss (chronic): Secondary | ICD-10-CM

## 2021-07-04 DIAGNOSIS — Z7984 Long term (current) use of oral hypoglycemic drugs: Secondary | ICD-10-CM | POA: Insufficient documentation

## 2021-07-04 DIAGNOSIS — F32A Depression, unspecified: Secondary | ICD-10-CM | POA: Diagnosis not present

## 2021-07-04 DIAGNOSIS — K3189 Other diseases of stomach and duodenum: Secondary | ICD-10-CM | POA: Insufficient documentation

## 2021-07-04 DIAGNOSIS — E119 Type 2 diabetes mellitus without complications: Secondary | ICD-10-CM | POA: Diagnosis not present

## 2021-07-04 DIAGNOSIS — K31819 Angiodysplasia of stomach and duodenum without bleeding: Secondary | ICD-10-CM | POA: Diagnosis not present

## 2021-07-04 DIAGNOSIS — R69 Illness, unspecified: Secondary | ICD-10-CM | POA: Diagnosis not present

## 2021-07-04 DIAGNOSIS — Z794 Long term (current) use of insulin: Secondary | ICD-10-CM | POA: Insufficient documentation

## 2021-07-04 DIAGNOSIS — K5520 Angiodysplasia of colon without hemorrhage: Secondary | ICD-10-CM | POA: Diagnosis not present

## 2021-07-04 DIAGNOSIS — I10 Essential (primary) hypertension: Secondary | ICD-10-CM | POA: Insufficient documentation

## 2021-07-04 DIAGNOSIS — D649 Anemia, unspecified: Secondary | ICD-10-CM | POA: Diagnosis not present

## 2021-07-04 DIAGNOSIS — Z6826 Body mass index (BMI) 26.0-26.9, adult: Secondary | ICD-10-CM | POA: Insufficient documentation

## 2021-07-04 HISTORY — PX: SUBMUCOSAL TATTOO INJECTION: SHX6856

## 2021-07-04 HISTORY — PX: ENTEROSCOPY: SHX5533

## 2021-07-04 HISTORY — PX: HOT HEMOSTASIS: SHX5433

## 2021-07-04 LAB — GLUCOSE, CAPILLARY: Glucose-Capillary: 91 mg/dL (ref 70–99)

## 2021-07-04 SURGERY — ENTEROSCOPY
Anesthesia: Monitor Anesthesia Care

## 2021-07-04 MED ORDER — LACTATED RINGERS IV SOLN: INTRAVENOUS | Status: DC | PRN

## 2021-07-04 MED ORDER — PROPOFOL 500 MG/50ML IV EMUL
INTRAVENOUS | Status: AC
  Filled 2021-07-04: qty 50

## 2021-07-04 MED ORDER — SPOT INK MARKER SYRINGE KIT
PACK | SUBMUCOSAL | Status: DC | PRN
  Administered 2021-07-04: 1.5 mL via SUBMUCOSAL

## 2021-07-04 MED ORDER — LIDOCAINE HCL 1 % IJ SOLN
INTRAMUSCULAR | Status: DC | PRN
  Administered 2021-07-04: 40 mg via INTRADERMAL

## 2021-07-04 MED ORDER — LABETALOL HCL 5 MG/ML IV SOLN
INTRAVENOUS | Status: DC | PRN
  Administered 2021-07-04: 2.5 mg via INTRAVENOUS

## 2021-07-04 MED ORDER — PROPOFOL 10 MG/ML IV BOLUS
INTRAVENOUS | Status: DC | PRN
  Administered 2021-07-04 (×4): 10 mg via INTRAVENOUS

## 2021-07-04 MED ORDER — PROPOFOL 500 MG/50ML IV EMUL
INTRAVENOUS | Status: DC | PRN
Start: 2021-07-04 — End: 2021-07-04
  Administered 2021-07-04: 150 ug/kg/min via INTRAVENOUS

## 2021-07-04 NOTE — Transfer of Care (Signed)
Immediate Anesthesia Transfer of Care Note  Patient: Anthony Cuevas  Procedure(s) Performed: ENTEROSCOPY HOT HEMOSTASIS (ARGON PLASMA COAGULATION/BICAP) SUBMUCOSAL TATTOO INJECTION  Patient Location: PACU and Endoscopy Unit  Anesthesia Type:MAC  Level of Consciousness: oriented, drowsy and patient cooperative  Airway & Oxygen Therapy: Patient Spontanous Breathing and Patient connected to face mask oxygen  Post-op Assessment: Report given to RN and Post -op Vital signs reviewed and stable  Post vital signs: Reviewed and stable  Last Vitals:  Vitals Value Taken Time  BP 141/58 07/04/21 1111  Temp 36.5 C 07/04/21 1111  Pulse 80 07/04/21 1114  Resp 22 07/04/21 1114  SpO2 100 % 07/04/21 1114  Vitals shown include unvalidated device data.  Last Pain:  Vitals:   07/04/21 1111  TempSrc: Temporal  PainSc: Asleep         Complications: No notable events documented.

## 2021-07-04 NOTE — H&P (Signed)
History and Physical:  This patient presents for endoscopic testing for: Iron deficiency anemia and gastric AVMs  This patient has iron deficiency anemia last seen in our office February 2023.  He then had upper endoscopy and colonoscopy mid April revealing nonbleeding gastric AVMs and no source of blood loss in the colon.  Outpatient video capsule study was attempted, but the device was retained in the stomach for the entirety of battery life.  Follow-up KUB showed passage of the capsule. This patient also had an endoscopic work-up for anemia in 2017. He is here today for small bowel enteroscopy for APC ablation of the previously seen gastric AVMs as well as any other culprit lesions that may be encountered in the small bowel. Mr. Zenz has been off Plavix 5 days before this procedure.  Patient is otherwise without complaints or active issues today.   Past Medical History: Past Medical History:  Diagnosis Date   Arthritis    Coronary atherosclerosis    a. 07/2018 CT Chest: 3 vessel coronary atherosclerosis; b. 07/2018 MV: EF 46% (GI uptake artifact), No ischemia/infarct. Mild to mod diffuse Ao atherosclerosis and at least moderate 2 vessel CAD (LAD/RCA) on CT imaging. Low risk.   Depression    improved on sertraline   Diabetes mellitus, type 2 ([place]) 09/1996   GERD (gastroesophageal reflux disease) 1990   History of peptic ulcer disease    Hyperlipidemia 1992   Hypertension 05/2003   Lumbar stenosis L2-3   Muscle atrophy    in arms from degenerative changes in neck   Neck pain    chronic   Smoker    Wears glasses      Past Surgical History: Past Surgical History:  Procedure Laterality Date   ABDOMINAL AORTOGRAM W/LOWER EXTREMITY N/A 09/03/2019   Procedure: ABDOMINAL AORTOGRAM W/LOWER EXTREMITY;  Surgeon: Wellington Hampshire, MD;  Location: [facility];  Service: Cardiovascular;  Laterality: N/A;  Limited Study   CERVICAL DISC SURGERY  04/19/2002   fusion, Dr. Lorin Mercy    CERVICAL DISCECTOMY  01/31/1995   partial   CERVICAL DISCECTOMY  01/30/1998   COLONOSCOPY WITH ESOPHAGOGASTRODUODENOSCOPY (EGD)  06/25/2015   HEMORRHOID SURGERY  01/31/1987   KNEE ARTHROPLASTY  11/20/2011   Procedure: COMPUTER ASSISTED TOTAL KNEE ARTHROPLASTY;  Surgeon: Marybelle Killings, MD;  Location: [facility];  Service: Orthopedics;  Laterality: Left;  Conversion Left Knee Medial Uni to Total Knee Arthroplasty-Cemented   LUMBAR LAMINECTOMY/DECOMPRESSION MICRODISCECTOMY N/A 04/28/2015   Procedure: L2-3 Decompression;  Surgeon: Marybelle Killings, MD;  Location: [facility];  Service: Orthopedics;  Laterality: N/A;   MR MRA DUPLICATE EXAM  INACTIVATE     MULTIPLE TOOTH EXTRACTIONS     Neisen funduplication  01/60/1093   PERIPHERAL VASCULAR INTERVENTION  09/03/2019   Procedure: PERIPHERAL VASCULAR INTERVENTION;  Surgeon: Wellington Hampshire, MD;  Location: [facility];  Service: Cardiovascular;;  Iliac Stent   REPLACEMENT TOTAL KNEE Left 02/14/02, 05/04/02   left- partial   SCC EXCISION  04/25/2001   Dr. March Rummage   STOMACH SURGERY  01/30/1982   PUD, HH    Allergies: Allergies  Allergen Reactions   Zanaflex [Tizanidine]     Drowsy with med use.  Tolerates flexeril    Outpatient Meds: No current facility-administered medications for this encounter.      ___________________________________________________________________ Objective   Exam:  BP (!) 185/81   Pulse 89   Temp 97.6 F (36.4 C) (Oral)   Resp 20   Ht '5\' 10"'$  (1.778 m)  Wt 85.3 kg   BMI 26.98 kg/m   CV: RRR without murmur, S1/S2 Resp: clear to auscultation bilaterally, normal RR and effort noted GI: soft, no tenderness, with active bowel sounds. Neither skin nor conjunctival pallor  Assessment: Iron deficiency anemia Gastric AVMs Long-term use antiplatelet agent  Plan: Small bowel enteroscopy with APC ablation of AVMs.  The benefits and risks of the planned procedure were described in detail with the patient or (when  appropriate) their health care proxy.  Risks were outlined as including, but not limited to, bleeding, infection, perforation, adverse medication reaction leading to cardiac or pulmonary decompensation, pancreatitis (if ERCP).  The limitation of incomplete mucosal visualization was also discussed.  No guarantees or warranties were given.   The patient is appropriate for an endoscopic procedure in the ambulatory setting.   - Wilfrid Lund, MD

## 2021-07-04 NOTE — Discharge Instructions (Signed)
YOU HAD AN ENDOSCOPIC PROCEDURE TODAY: Refer to the procedure report and other information in the discharge instructions given to you for any specific questions about what was found during the examination. If this information does not answer your questions, please call [place] office at [phone] to clarify.   YOU SHOULD EXPECT: Some feelings of bloating in the abdomen. Passage of more gas than usual. Walking can help get rid of the air that was put into your GI tract during the procedure and reduce the bloating. If you had a lower endoscopy (such as a colonoscopy or flexible sigmoidoscopy) you may notice spotting of blood in your stool or on the toilet paper. Some abdominal soreness may be present for a day or two, also.  DIET: Your first meal following the procedure should be a light meal and then it is ok to progress to your normal diet. A half-sandwich or bowl of soup is an example of a good first meal. Heavy or fried foods are harder to digest and may make you feel nauseous or bloated. Drink plenty of fluids but you should avoid alcoholic beverages for 24 hours. If you had a esophageal dilation, please see attached instructions for diet.    ACTIVITY: Your care partner should take you home directly after the procedure. You should plan to take it easy, moving slowly for the rest of the day. You can resume normal activity the day after the procedure however YOU SHOULD NOT DRIVE, use power tools, machinery or perform tasks that involve climbing or major physical exertion for 24 hours (because of the sedation medicines used during the test).   SYMPTOMS TO REPORT IMMEDIATELY: A gastroenterologist can be reached at any hour. Please call [phone]  for any of the following symptoms:   Following upper endoscopy (EGD, EUS, ERCP, esophageal dilation) Vomiting of blood or coffee ground material  New, significant abdominal pain  New, significant chest pain or pain under the shoulder blades  Painful or  persistently difficult swallowing  New shortness of breath  Black, tarry-looking or red, bloody stools  FOLLOW UP:  If any biopsies were taken you will be contacted by phone or by letter within the next 1-3 weeks. Call [phone]  if you have not heard about the biopsies in 3 weeks.  Please also call with any specific questions about appointments or follow up tests.  

## 2021-07-04 NOTE — Op Note (Signed)
[facility] Patient Name: Anthony Cuevas Procedure Date: 07/04/2021 MRN: [id number] Attending MD: Estill Cotta. Danis , MD Date of Birth: Mar 27, 1950 CSN: [id number] Age: 71 Admit Type: Outpatient Procedure:                Small bowel enteroscopy Indications:              Iron deficiency anemia secondary to chronic blood                            loss, For therapy of angioectasia                           two gastric AVMs seen on EGD April 2023, no                            bleeding source on colonoscopy.                           VCE retained in stomach - no small bowel images Providers:                Mallie Mussel L. Loletha Carrow, MD, Allayne Gitelman, RN, Benetta Spar, Technician Referring MD:             Elveria Rising. Damita Dunnings, MD Medicines:                Monitored Anesthesia Care Complications:            No immediate complications. Estimated Blood Loss:     Estimated blood loss was minimal. Procedure:                Pre-Anesthesia Assessment:                           - Prior to the procedure, a History and Physical                            was performed, and patient medications and                            allergies were reviewed. The patient's tolerance of                            previous anesthesia was also reviewed. The risks                            and benefits of the procedure and the sedation                            options and risks were discussed with the patient.                            All questions were answered, and informed consent  was obtained. Prior Anticoagulants: The patient has                            taken Plavix (clopidogrel), last dose was 5 days                            prior to procedure. ASA Grade Assessment: III - A                            patient with severe systemic disease. After                            reviewing the risks and benefits, the patient was                             deemed in satisfactory condition to undergo the                            procedure.                           After obtaining informed consent, the endoscope was                            passed under direct vision. Throughout the                            procedure, the patient's blood pressure, pulse, and                            oxygen saturations were monitored continuously. The                            PCF-HQ190L ([id number] Olympus colonoscope was                            introduced through the mouth and advanced to the                            proximal jejunum. The small bowel enteroscopy was                            accomplished without difficulty. The patient                            tolerated the procedure well. Scope In: Scope Out: Findings:      The esophagus was normal.      Evidence of a Nissen fundoplication was found in the gastric fundus. The       wrap appeared loose.      Patchy mildly erythematous mucosa was found in the stomach.      Two angioectasia with no bleeding was found on the greater curvature and       posterior wall of the gastric body. Coagulation for hemostasis using       argon  plasma at 0.4 liters/minute and 40 watts was successful.      Two angioectasias with no bleeding were found in the proximal jejunum.       Coagulation for hemostasis using argon plasma at 0.5 liters/minute and       20 watts was successful. Area (extent of insertion) was tattooed with an       injection of 0.5 mL of Spot (carbon black). Impression:               - Normal esophagus.                           - A Nissen fundoplication was found. The wrap                            appears loose.                           - Erythematous mucosa in the.                           - A single non-bleeding angioectasia in the                            stomach. Treated with argon plasma coagulation                            (APC).                           - Two  non-bleeding angioectasias in the jejunum.                            Treated with argon plasma coagulation (APC).                            Tattooed.                           - No specimens collected. Recommendation:           - Patient has a contact number available for                            emergencies. The signs and symptoms of potential                            delayed complications were discussed with the                            patient. Return to normal activities tomorrow.                            Written discharge instructions were provided to the                            patient.                           -  Resume previous diet.                           - Resume plavix at usual dose tomorrow.                           CBC and iron studies within next week.                           Continue iron tablets Procedure Code(s):        --- Professional ---                           [phone], Small intestinal endoscopy, enteroscopy                            beyond second portion of duodenum, not including                            ileum; with control of bleeding (eg, injection,                            bipolar cautery, unipolar cautery, laser, heater                            probe, stapler, plasma coagulator)                           44799, Unlisted procedure, small intestine Diagnosis Code(s):        --- Professional ---                           O84.166, Other specified postprocedural states                           K31.89, Other diseases of stomach and duodenum                           K31.819, Angiodysplasia of stomach and duodenum                            without bleeding                           K55.20, Angiodysplasia of colon without hemorrhage                           D50.0, Iron deficiency anemia secondary to blood                            loss (chronic) CPT copyright 2019 [employer]. All rights reserved. The codes documented in  this report are preliminary and upon coder review may  be revised to meet current compliance requirements. Daniele Dillow L. Loletha Carrow, MD 07/04/2021 11:20:19 AM This report has been signed electronically. Number of Addenda: 0

## 2021-07-04 NOTE — Anesthesia Preprocedure Evaluation (Addendum)
Anesthesia Evaluation  Patient identified by MRN, date of birth, ID band Patient awake    Reviewed: Allergy & Precautions, NPO status , Patient's Chart, lab work & pertinent test results, reviewed documented beta blocker date and time   Airway Mallampati: II  TM Distance: >3 FB Neck ROM: Full    Dental  (+) Edentulous Upper, Edentulous Lower   Pulmonary former smoker,    breath sounds clear to auscultation       Cardiovascular hypertension, Pt. on medications + CAD   Rhythm:Regular Rate:Normal  Echo 2020 LVEF 46%   Neuro/Psych  Headaches, PSYCHIATRIC DISORDERS Depression  Neuromuscular disease    GI/Hepatic Neg liver ROS, GERD  Medicated,Gastric AVM's   Endo/Other  diabetes, Well Controlled, Type 2, Oral Hypoglycemic Agents, Insulin DependentMorbid obesity  Renal/GU negative Renal ROS  negative genitourinary   Musculoskeletal  (+) Arthritis , Osteoarthritis,    Abdominal   Peds  Hematology  (+) Blood dyscrasia, anemia , Plavix therapy- last dose 5/31   Anesthesia Other Findings   Reproductive/Obstetrics                           Anesthesia Physical  Anesthesia Plan  ASA: 3  Anesthesia Plan: MAC   Post-op Pain Management: Minimal or no pain anticipated   Induction: Intravenous  PONV Risk Score and Plan: 1 and Treatment may vary due to age or medical condition and Propofol infusion  Airway Management Planned: Natural Airway, Nasal Cannula and Simple Face Mask  Additional Equipment: None  Intra-op Plan:   Post-operative Plan:   Informed Consent: I have reviewed the patients History and Physical, chart, labs and discussed the procedure including the risks, benefits and alternatives for the proposed anesthesia with the patient or authorized representative who has indicated his/her understanding and acceptance.     Dental advisory given  Plan Discussed with: CRNA and  Anesthesiologist  Anesthesia Plan Comments:         Anesthesia Quick Evaluation

## 2021-07-04 NOTE — Interval H&P Note (Signed)
History and Physical Interval Note:  07/04/2021 10:35 AM  Anthony Cuevas  has presented today for surgery, with the diagnosis of gastric AVM's, IDA.  The various methods of treatment have been discussed with the patient and family. After consideration of risks, benefits and other options for treatment, the patient has consented to  Procedure(s): ENTEROSCOPY (N/A) HOT HEMOSTASIS (ARGON PLASMA COAGULATION/BICAP) (N/A) as a surgical intervention.  The patient's history has been reviewed, patient examined, no change in status, stable for surgery.  I have reviewed the patient's chart and labs.  Questions were answered to the patient's satisfaction.     Nelida Meuse III

## 2021-07-04 NOTE — Interval H&P Note (Signed)
History and Physical Interval Note:  07/04/2021 10:40 AM  Anthony Cuevas  has presented today for surgery, with the diagnosis of gastric AVM's, IDA.  The various methods of treatment have been discussed with the patient and family. After consideration of risks, benefits and other options for treatment, the patient has consented to  Procedure(s): ENTEROSCOPY (N/A) HOT HEMOSTASIS (ARGON PLASMA COAGULATION/BICAP) (N/A) as a surgical intervention.  The patient's history has been reviewed, patient examined, no change in status, stable for surgery.  I have reviewed the patient's chart and labs.  Questions were answered to the patient's satisfaction.     Nelida Meuse III

## 2021-07-04 NOTE — Anesthesia Postprocedure Evaluation (Signed)
Anesthesia Post Note  Patient: Anthony Cuevas  Procedure(s) Performed: ENTEROSCOPY HOT HEMOSTASIS (ARGON PLASMA COAGULATION/BICAP) SUBMUCOSAL TATTOO INJECTION     Patient location during evaluation: PACU Anesthesia Type: MAC Level of consciousness: awake and alert and oriented Pain management: pain level controlled Vital Signs Assessment: post-procedure vital signs reviewed and stable Respiratory status: spontaneous breathing, nonlabored ventilation and respiratory function stable Cardiovascular status: stable and blood pressure returned to baseline Postop Assessment: no apparent nausea or vomiting Anesthetic complications: no   No notable events documented.  Last Vitals:  Vitals:   07/04/21 1120 07/04/21 1125  BP: 118/64 128/68  Pulse: 83 80  Resp: 15 18  Temp: 36.5 C 36.6 C  SpO2: 98% 98%    Last Pain:  Vitals:   07/04/21 1125  TempSrc: Temporal  PainSc: 0-No pain                 Gavriel Holzhauer A.

## 2021-07-05 ENCOUNTER — Encounter (HOSPITAL_COMMUNITY): Payer: Self-pay | Admitting: Gastroenterology

## 2021-07-06 ENCOUNTER — Encounter: Payer: Self-pay | Admitting: Endocrinology

## 2021-07-06 ENCOUNTER — Telehealth: Payer: Self-pay | Admitting: Family Medicine

## 2021-07-06 DIAGNOSIS — D649 Anemia, unspecified: Secondary | ICD-10-CM

## 2021-07-06 NOTE — Telephone Encounter (Signed)
Please contact patient about getting f/u CBC and iron level in the next few days.  I put in the orders.  I saw his note from GI.  He is likely going to need periodic IV iron (in addition to oral iron) and we can work on setting that up if needed after I see his follow-up labs.  Thanks.   ============  Danis, Kirke Corin, MD  Tonia Ghent, MD Phillip Heal,   I saw this mutual patient for a small bowel enteroscopy at [facility] long today.  AVMs in the small bowel and stomach were ablated, and I suspect these have been the source of some chronic occult GI blood loss leading to iron deficiency anemia.  We had tried a small bowel video capsule study recently, but unfortunately it was retained in the stomach for the duration of battery life, so there are no small bowel images.  Often in this scenario there are more AVMs beyond what can be reached with the enteroscope today.  Assuming that and with his overall clinical picture, he will need close follow-up to check his hemoglobin and iron levels in order to dose iron accordingly.  He has been on oral iron, but can only take 1 tablet every other day because it causes digestive upset and constipation.  In that regard, he should be considered for periodic IV iron either through hematology consult or under your management at the outpatient infusion center.  When I saw him and his wife today, I recommended he have a CBC and iron studies within a week, and he asked me to communicate all this to you so he could have it done at your office out of convenience.  Please let me know if you have any questions or concerns.   Wilfrid Lund, [name]

## 2021-07-07 NOTE — Telephone Encounter (Signed)
Monday should be fine.  I didn't see the OV scheduled- I apologize.  Thanks.

## 2021-07-07 NOTE — Telephone Encounter (Signed)
Patient has a f/u appt with Dr Damita Dunnings already on 07/11/21. Does patient need to come for lab tomorrow? Or can wait until Monday?

## 2021-07-07 NOTE — Telephone Encounter (Signed)
Patient advised.

## 2021-07-09 DIAGNOSIS — Z794 Long term (current) use of insulin: Secondary | ICD-10-CM | POA: Diagnosis not present

## 2021-07-09 DIAGNOSIS — E119 Type 2 diabetes mellitus without complications: Secondary | ICD-10-CM | POA: Diagnosis not present

## 2021-07-11 ENCOUNTER — Ambulatory Visit (INDEPENDENT_AMBULATORY_CARE_PROVIDER_SITE_OTHER): Payer: Medicare HMO | Admitting: Family Medicine

## 2021-07-11 ENCOUNTER — Encounter: Payer: Self-pay | Admitting: Family Medicine

## 2021-07-11 DIAGNOSIS — D509 Iron deficiency anemia, unspecified: Secondary | ICD-10-CM | POA: Diagnosis not present

## 2021-07-11 DIAGNOSIS — M542 Cervicalgia: Secondary | ICD-10-CM

## 2021-07-11 DIAGNOSIS — D649 Anemia, unspecified: Secondary | ICD-10-CM

## 2021-07-11 DIAGNOSIS — M79643 Pain in unspecified hand: Secondary | ICD-10-CM

## 2021-07-11 LAB — IRON: Iron: 144 ug/dL (ref 42–165)

## 2021-07-11 MED ORDER — TRAMADOL HCL 50 MG PO TABS: 50.0000 mg | ORAL_TABLET | Freq: Three times a day (TID) | ORAL | 1 refills | Status: DC | PRN

## 2021-07-11 MED ORDER — ASCORBIC ACID 500 MG PO TABS: 500.0000 mg | ORAL_TABLET | Freq: Every day | ORAL | Status: AC

## 2021-07-11 NOTE — Progress Notes (Unsigned)
Diffuse IP joint changes.  Having L 2nd finger pain.  Pain with grip.  He tries to keep his hands/fingers moving to limit stiffness.  He was taking unrecalled OTC med for hand pain.  I asked him to update me about that.  Oral NSAID cautions discussed with patient.  He has endo f/u pending for 07/2021.  Sugar has been ~200 in the last few days in the early AM but he isn't having lows now.    He is taking iron QOD.  He had GI f/u in the meantime.  F/u labs pending.  Taking vit C daly. No bloody or black stools.    Tramadol and flexeril help his back and neck pain.  No ADE on med.  Compliant.  He didn't tolerate tizanidine.    Meds, vitals, and allergies reviewed.   ROS: Per HPI unless specifically indicated in ROS section   GEN: nad, alert and oriented HEENT: ncat NECK: supple w/o LA CV: rrr.   PULM: ctab, no inc wob ABD: soft, +bs EXT: no edema SKIN: no acute rash Diffuse IP arthritic changes bilaterally.

## 2021-07-11 NOTE — Patient Instructions (Addendum)
Go to the lab on the way out.   If you have mychart we'll likely use that to update you.    Take care.  Glad to see you. Update me about the arthritis pills that you have been using.   I wouldn't take aspirin or aleve or ibuprofen given your history.

## 2021-07-12 LAB — CBC WITH DIFFERENTIAL/PLATELET
Basophils Absolute: 0.1 10*3/uL (ref 0.0–0.1)
Basophils Relative: 1.1 % (ref 0.0–3.0)
Eosinophils Absolute: 0.1 10*3/uL (ref 0.0–0.7)
Eosinophils Relative: 2.7 % (ref 0.0–5.0)
HCT: 36.9 % — ABNORMAL LOW (ref 39.0–52.0)
Hemoglobin: 12.2 g/dL — ABNORMAL LOW (ref 13.0–17.0)
Lymphocytes Relative: 17.3 % (ref 12.0–46.0)
Lymphs Abs: 0.8 10*3/uL (ref 0.7–4.0)
MCHC: 33.1 g/dL (ref 30.0–36.0)
MCV: 90.4 fl (ref 78.0–100.0)
Monocytes Absolute: 0.5 10*3/uL (ref 0.1–1.0)
Monocytes Relative: 9.8 % (ref 3.0–12.0)
Neutro Abs: 3.3 10*3/uL (ref 1.4–7.7)
Neutrophils Relative %: 69.1 % (ref 43.0–77.0)
Platelets: 142 10*3/uL — ABNORMAL LOW (ref 150.0–400.0)
RBC: 4.08 Mil/uL — ABNORMAL LOW (ref 4.22–5.81)
RDW: 15.8 % — ABNORMAL HIGH (ref 11.5–15.5)
WBC: 4.8 10*3/uL (ref 4.0–10.5)

## 2021-07-12 NOTE — Progress Notes (Unsigned)
Date:  07/13/2021   ID:  Anthony Cuevas, DOB 1950-09-05, MRN [id number]  Patient Location:  [facility] [id number]   Provider location:   [facility], [place] office  PCP:  Anthony Ghent, MD  Cardiologist:  Anthony Cuevas [facility]  Chief Complaint  Patient presents with   Other    12 month f/u c/o left foot tingling sensation. Meds reviewed verbally with pt.    History of Present Illness:    Anthony Cuevas is a 71 y.o. male  past medical history of  former smoker, >20 years, quit >8 years ago Coronary artery disease on CT scan Hyperlipidemia Diabetes II PAD, stent to left LE Presents for f/u of his  coronary artery disease, shortness of breath  Last seen by myself in clinic December 2021 Seen by Dr. Fletcher Anon for PAD August 2022 On Plavix, no aspirin  August 2022 lower extremity arterial Doppler Normal ABIs  borderline stenosis in the left external iliac artery.,  Peak systolic velocity greater than 300  Has been working outside today, dehydrated, BP low, heart rate elevated 115 bpm Reports typically heart rate lower than well controlled at home, " typically in the green" when he uses his blood pressure cuff Was chasing his rooster in the backyard today and then hurried to the clinic for appointment  Has some tingling left ball of his foot end of the day typically resolves after taking shoes off and rubbing his feet Denies cramping concerning for claudication  Cuevas work reviewed A1C 8.3 LDL 78  EKG personally reviewed by myself on todays visit Sinus tachycardia rate 115 bpm no significant ST-T wave changes  Other past medical history reviewed Prior angiography which showed moderate Cuevas common iliac artery stenosis, occluded and heavily calcified left common iliac artery in a long segment.   -successful angioplasty and covered stent placement to the left common iliac artery.  Postprocedure vascular studies showed  improvement in ABI to normal with patent iliac stent and moderately elevated velocities just distal to the stent.  Prior imaging reviewed CT scan chest July 29, 2018 with at least moderate three-vessel coronary atherosclerosis, Moderate diffuse aortic atherosclerosis Emphysema  Cuevas work reviewed with him on todays visit Hemoglobin A1c used to be 9.9, Now 7.6 LDL 78 Total cholesterol 135  Discussed issues with his grandchild, 66 years old needing what sounds like aortic surgery or cardiac surgery    Past Medical History:  Diagnosis Date   Arthritis    Coronary atherosclerosis    a. 07/2018 CT Chest: 3 vessel coronary atherosclerosis; b. 07/2018 MV: EF 46% (GI uptake artifact), No ischemia/infarct. Mild to mod diffuse Ao atherosclerosis and at least moderate 2 vessel CAD (LAD/RCA) on CT imaging. Low risk.   Depression    improved on sertraline   Diabetes mellitus, type 2 ([place]) 09/1996   GERD (gastroesophageal reflux disease) 1990   History of peptic ulcer disease    Hyperlipidemia 1992   Hypertension 05/2003   Lumbar stenosis L2-3   Muscle atrophy    in arms from degenerative changes in neck   Neck pain    chronic   Smoker    Wears glasses    Past Surgical History:  Procedure Laterality Date   ABDOMINAL AORTOGRAM W/LOWER EXTREMITY N/A 09/03/2019   Procedure: ABDOMINAL AORTOGRAM W/LOWER EXTREMITY;  Surgeon: Anthony Hampshire, MD;  Location: [facility];  Service: Cardiovascular;  Laterality: N/A;  Limited Study   CERVICAL  [place] SURGERY  04/19/2002   fusion, Dr. Lorin Cuevas   CERVICAL DISCECTOMY  01/31/1995   partial   CERVICAL DISCECTOMY  01/30/1998   COLONOSCOPY WITH ESOPHAGOGASTRODUODENOSCOPY (EGD)  06/25/2015   ENTEROSCOPY N/A 07/04/2021   Procedure: ENTEROSCOPY;  Surgeon: Anthony Stabler, MD;  Location: [facility];  Service: Anthony Cuevas;  Laterality: N/A;   HEMORRHOID SURGERY  01/31/1987   HOT HEMOSTASIS N/A 07/04/2021   Procedure: HOT HEMOSTASIS (ARGON PLASMA  COAGULATION/BICAP);  Surgeon: Anthony Stabler, MD;  Location: [facility];  Service: Anthony Cuevas;  Laterality: N/A;   KNEE ARTHROPLASTY  11/20/2011   Procedure: COMPUTER ASSISTED TOTAL KNEE ARTHROPLASTY;  Surgeon: Anthony Killings, MD;  Location: [facility];  Service: Orthopedics;  Laterality: Left;  Conversion Left Knee Medial Uni to Total Knee Arthroplasty-Cemented   LUMBAR LAMINECTOMY/DECOMPRESSION MICRODISCECTOMY N/A 04/28/2015   Procedure: L2-3 Decompression;  Surgeon: Anthony Killings, MD;  Location: Anthony [facility];  Service: Orthopedics;  Laterality: N/A;   MR MRA DUPLICATE EXAM  INACTIVATE     MULTIPLE TOOTH EXTRACTIONS     Anthony Cuevas  [id number]/6811   PERIPHERAL VASCULAR INTERVENTION  09/03/2019   Procedure: PERIPHERAL VASCULAR INTERVENTION;  Surgeon: Anthony Hampshire, MD;  Location: Anthony [facility];  Service: Cardiovascular;;  Iliac Stent   REPLACEMENT TOTAL KNEE Left 02/14/02, 05/04/02   left- partial   SCC EXCISION  04/25/2001   Dr. March Rummage   STOMACH SURGERY  01/30/1982   PUD, [place]   SUBMUCOSAL TATTOO INJECTION  07/04/2021   Procedure: SUBMUCOSAL TATTOO INJECTION;  Surgeon: Anthony Stabler, MD;  Location: Anthony [facility];  Service: Anthony Cuevas;;     Current Meds  Medication Sig   ascorbic acid (VITAMIN C) 500 MG tablet Take 1 tablet (500 mg total) by mouth daily.   Cholecalciferol (VITAMIN D3) 125 MCG (5000 UT) CAPS Take 5,000 Units by mouth 2 (two) times a week.   Continuous Blood Gluc Sensor (FREESTYLE LIBRE 14 DAY SENSOR) MISC 1 Device by Does not apply route every 14 (fourteen) days.   cyanocobalamin 1000 MCG tablet Take 1,000 mcg by mouth daily.   cyclobenzaprine (FLEXERIL) 10 MG tablet TAKE 1 TABLET TWICE A DAY AS NEEDED FOR MUSCLE SPASMS (SEDATION CAUTION)   Dulaglutide (TRULICITY) 5.72 IO/0.3TD SOPN Inject 0.75 mg into the skin once a week.   ezetimibe (ZETIA) 10 MG tablet Take 1 tablet (10 mg total) by mouth daily.   Insulin NPH, Human,, Isophane, (NOVOLIN N FLEXPEN) 100  UNIT/ML Kiwkpen Inject 160 Units into the skin every morning. and pen needles 3/day.   Insulin Regular Human (NOVOLIN R FLEXPEN RELION) 100 UNIT/ML KwikPen Inject 25 Units as directed daily with supper.   Iron, Ferrous Sulfate, 325 (65 Fe) MG TABS Take 325 mg by mouth every other day.   latanoprost (XALATAN) 0.005 % ophthalmic solution Place 1 drop into both eyes at bedtime.   lisinopril (ZESTRIL) 40 MG tablet TAKE 1 TABLET DAILY   Omega-3 Fatty Acids (FISH OIL PO) Take 1,000 mg by mouth once a week.   ONETOUCH ULTRA test strip USE AS INSTRUCTED TO TEST BLOOD SUGARS 3 OR 4 TIMES DAILY   SURE COMFORT PEN NEEDLES 32G X 4 MM MISC USE THREE TIMES A DAY   traMADol (ULTRAM) 50 MG tablet Take 1 tablet (50 mg total) by mouth 3 (three) times daily as needed for moderate pain.   [DISCONTINUED] clopidogrel (PLAVIX) 75 MG tablet TAKE 1 TABLET DAILY   [DISCONTINUED] rosuvastatin (CRESTOR) 20 MG tablet TAKE 1 TABLET DAILY  Allergies:   Zanaflex [tizanidine]   Social History   Tobacco Use   Smoking status: Former    Packs/day: 1.00    Years: 41.00    Total pack years: 41.00    Types: Cigarettes    Quit date: 2014    Years since quitting: 9.4   Smokeless tobacco: Never  Vaping Use   Vaping Use: Never used  Substance Use Topics   Alcohol use: No    Alcohol/week: 0.0 standard drinks of alcohol   Drug use: Never     Current Outpatient Medications on File Prior to Visit  Medication Sig Dispense Refill   ascorbic acid (VITAMIN C) 500 MG tablet Take 1 tablet (500 mg total) by mouth daily.     Cholecalciferol (VITAMIN D3) 125 MCG (5000 UT) CAPS Take 5,000 Units by mouth 2 (two) times a week.     Continuous Blood Gluc Sensor (FREESTYLE LIBRE 14 DAY SENSOR) MISC 1 Device by Does not apply route every 14 (fourteen) days. 6 each 3   cyanocobalamin 1000 MCG tablet Take 1,000 mcg by mouth daily.     cyclobenzaprine (FLEXERIL) 10 MG tablet TAKE 1 TABLET TWICE A DAY AS NEEDED FOR MUSCLE SPASMS (SEDATION  CAUTION) 180 tablet 3   Dulaglutide (TRULICITY) 1.61 WR/6.0AV SOPN Inject 0.75 mg into the skin once a week. 6 mL 3   Insulin NPH, Human,, Isophane, (NOVOLIN N FLEXPEN) 100 UNIT/ML Kiwkpen Inject 160 Units into the skin every morning. and pen needles 3/day. 165 mL 3   Insulin Regular Human (NOVOLIN R FLEXPEN RELION) 100 UNIT/ML KwikPen Inject 25 Units as directed daily with supper. 30 mL 3   Iron, Ferrous Sulfate, 325 (65 Fe) MG TABS Take 325 mg by mouth every other day.     latanoprost (XALATAN) 0.005 % ophthalmic solution Place 1 drop into both eyes at bedtime.     lisinopril (ZESTRIL) 40 MG tablet TAKE 1 TABLET DAILY 90 tablet 3   Omega-3 Fatty Acids (FISH OIL PO) Take 1,000 mg by mouth once a week.     ONETOUCH ULTRA test strip USE AS INSTRUCTED TO TEST BLOOD SUGARS 3 OR 4 TIMES DAILY 100 each 11   SURE COMFORT PEN NEEDLES 32G X 4 MM MISC USE THREE TIMES A DAY 100 each 10   traMADol (ULTRAM) 50 MG tablet Take 1 tablet (50 mg total) by mouth 3 (three) times daily as needed for moderate pain. 270 tablet 1   No current facility-administered medications on file prior to visit.     Family Hx: The patient's family history includes Breast cancer in his maternal grandmother; Diabetes in his brother, brother, father, mother, and sister; Heart disease in his brother and son; Hypertension in his mother; Lung disease in his paternal grandfather; Stroke in his mother. There is no history of Arthritis, Prostate cancer, Colon cancer, or Stomach cancer.  ROS:   Please see the history of present illness.    Review of Systems  Constitutional: Negative.   HENT: Negative.    Respiratory:  Positive for shortness of breath.   Cardiovascular: Negative.   Gastrointestinal: Negative.   Musculoskeletal: Negative.   Neurological: Negative.   Psychiatric/Behavioral: Negative.    All other systems reviewed and are negative.    Labs/Other Tests and Data Reviewed:    Recent Labs: 09/28/2020: ALT  34 01/03/2021: BUN 26; Creatinine, Ser 1.05; Potassium 4.0; Sodium 139; TSH 3.27 07/11/2021: Hemoglobin 12.2; Platelets 142.0   Recent Lipid Panel [facility] Results  Component Value Date/Time  CHOL 108 09/28/2020 02:52 PM   TRIG 202 (H) 09/28/2020 02:52 PM   HDL 30 (L) 09/28/2020 02:52 PM   CHOLHDL 3.6 09/28/2020 02:52 PM   CHOLHDL 4 12/05/2019 07:53 AM   LDLCALC 45 09/28/2020 02:52 PM   LDLDIRECT 78.0 12/05/2019 07:53 AM    Wt Readings from Last 3 Encounters:  07/13/21 202 lb 4 oz (91.7 kg)  07/11/21 205 lb (93 kg)  07/04/21 188 lb (85.3 kg)     Exam:    Vital Signs: Vital signs may also be detailed in the HPI BP (!) 100/50 (BP Location: Left Arm, Patient Position: Sitting, Cuff Size: Normal)   Pulse (!) 115   Ht '5\' 10"'$  (1.778 m)   Wt 202 lb 4 oz (91.7 kg)   SpO2 98%   BMI 29.02 kg/m   Constitutional:  oriented to person, place, and time. No distress.  HENT:  Head: Grossly normal Eyes:  no discharge. No scleral icterus.  Neck: No JVD, no carotid bruits  Cardiovascular: Regular rate and rhythm, no murmurs appreciated Pulmonary/Chest: Clear to auscultation bilaterally, no wheezes or rails Abdominal: Soft.  no distension.  no tenderness.  Musculoskeletal: Normal range of motion Neurological:  normal muscle tone. Coordination normal. No atrophy Skin: Skin warm and dry Psychiatric: normal affect, pleasant   ASSESSMENT & PLAN:    Yoniel was seen today for other.  Diagnoses and all orders for this visit:  PAD (peripheral artery disease) ([place]) -     EKG 12-Lead  Atherosclerosis of native coronary artery of native heart without angina pectoris -     EKG 12-Lead  Essential hypertension -     EKG 12-Lead  Hyperlipidemia, unspecified hyperlipidemia type -     EKG 12-Lead -     rosuvastatin (CRESTOR) 20 MG tablet; Take 1 tablet (20 mg total) by mouth daily.  S/P insertion of iliac artery stent -     EKG 12-Lead  Coronary artery disease involving native coronary artery  of native heart without angina pectoris  Claudication in peripheral vascular disease (HCC)  Mixed hyperlipidemia  Uncontrolled type 2 diabetes mellitus with hypoglycemia without coma ([place])  Other orders -     clopidogrel (PLAVIX) 75 MG tablet; Take 1 tablet (75 mg total) by mouth daily. -     ezetimibe (ZETIA) 10 MG tablet; Take 1 tablet (10 mg total) by mouth daily.   CAD with stable angina On plavix, Crestor, we will add Zetia Currently with no symptoms of angina. No further workup at this time. Continue current medication regimen.  PAD No claudication sx. moderate disease of left external iliac Denies claudication type symptoms Followed by Dr. Fletcher Anon priro intervention on the left  Hyperlipidemia As above we will add Zetia with his Crestor Goal LDL less than 55  Diabetes with complictions We have encouraged continued exercise, careful diet management in an effort to lose weight.  A1c continues to run high, discussed diet with him    Total encounter time more than 30 minutes  Greater than 50% was spent in counseling and coordination of care with the patient   Signed, Ida Rogue, [place] [id number], [place], [place] [address]

## 2021-07-13 ENCOUNTER — Ambulatory Visit (INDEPENDENT_AMBULATORY_CARE_PROVIDER_SITE_OTHER): Payer: Medicare HMO | Admitting: Cardiovascular Disease

## 2021-07-13 ENCOUNTER — Other Ambulatory Visit: Payer: Self-pay | Admitting: Family Medicine

## 2021-07-13 ENCOUNTER — Encounter: Payer: Self-pay | Admitting: Cardiovascular Disease

## 2021-07-13 VITALS — BP 100/50 | HR 115 | Ht 70.0 in | Wt 202.2 lb

## 2021-07-13 DIAGNOSIS — D649 Anemia, unspecified: Secondary | ICD-10-CM

## 2021-07-13 DIAGNOSIS — I2510 Atherosclerotic heart disease of native coronary artery without angina pectoris: Secondary | ICD-10-CM | POA: Diagnosis not present

## 2021-07-13 DIAGNOSIS — E782 Mixed hyperlipidemia: Secondary | ICD-10-CM

## 2021-07-13 DIAGNOSIS — E785 Hyperlipidemia, unspecified: Secondary | ICD-10-CM

## 2021-07-13 DIAGNOSIS — I739 Peripheral vascular disease, unspecified: Secondary | ICD-10-CM

## 2021-07-13 DIAGNOSIS — E11649 Type 2 diabetes mellitus with hypoglycemia without coma: Secondary | ICD-10-CM | POA: Diagnosis not present

## 2021-07-13 DIAGNOSIS — Z95828 Presence of other vascular implants and grafts: Secondary | ICD-10-CM | POA: Diagnosis not present

## 2021-07-13 DIAGNOSIS — I10 Essential (primary) hypertension: Secondary | ICD-10-CM

## 2021-07-13 MED ORDER — ROSUVASTATIN CALCIUM 20 MG PO TABS: 20.0000 mg | ORAL_TABLET | Freq: Every day | ORAL | 3 refills | Status: DC

## 2021-07-13 MED ORDER — CLOPIDOGREL BISULFATE 75 MG PO TABS: 75.0000 mg | ORAL_TABLET | Freq: Every day | ORAL | 3 refills | Status: DC

## 2021-07-13 MED ORDER — EZETIMIBE 10 MG PO TABS: 10.0000 mg | ORAL_TABLET | Freq: Every day | ORAL | 3 refills | Status: DC

## 2021-07-13 NOTE — Assessment & Plan Note (Signed)
Continue tramadol and Flexeril as needed.  Routine cautions given to patient.

## 2021-07-13 NOTE — Patient Instructions (Signed)
Medication Instructions:  Please start zetia/ezetimibe once a day for cholesterol  If you need a refill on your cardiac medications before your next appointment, please call your pharmacy.   Lab work: No new labs needed  Testing/Procedures: No new testing needed  Follow-Up: At [facility], you and your health needs are our priority.  As part of our continuing mission to provide you with exceptional heart care, we have created designated Provider Care Teams.  These Care Teams include your primary Cardiologist (physician) and Advanced Practice Providers (APPs -  Physician Assistants and Nurse Practitioners) who all work together to provide you with the care you need, when you need it.  You will need a follow up appointment in 12 months  Providers on your designated Care Team:   Murray Hodgkins, NP Christell Faith, PA-C Cadence Kathlen Mody, [place]  COVID-19 Vaccine Information can be found at: [email or web address].uk For questions related to vaccine distribution or appointments, please email [email or web address] or call [phone].

## 2021-07-13 NOTE — Assessment & Plan Note (Signed)
Discussed options.  See notes on labs.  He is taking iron every other day.  We can consider IV iron if needed.   Would expect current iron level to be lower than prior with QOD iron dosing.   Discussed with patient.  He agrees with plan.

## 2021-07-13 NOTE — Assessment & Plan Note (Signed)
NSAID cautions discussed with patient.  I asked him to update me about the over-the-counter medication that he has been using.  See after visit summary.

## 2021-07-14 ENCOUNTER — Telehealth: Payer: Self-pay | Admitting: Family Medicine

## 2021-07-14 NOTE — Telephone Encounter (Signed)
Patient came in an brought paperwork, need a med review by PCP and has been placed in PCP folder.

## 2021-07-14 NOTE — Telephone Encounter (Signed)
Received notification from patient that a PA is needed on his tramadol before it can be shipped to him. Please start PA for rx when possible.

## 2021-07-19 NOTE — Telephone Encounter (Signed)
I called to get a verbal PA for Tramadol for patient through [facility].  Spoke to Edmund, prior [place] rep.  She stated patient does not need a PA for this medication.  A claim was already submitted and approved on 07/11/21.

## 2021-07-19 NOTE — Telephone Encounter (Signed)
Patient notified regarding the PA for Tramadol. Patient stated that he was told that the medication was being shipped to him.

## 2021-08-08 DIAGNOSIS — Z794 Long term (current) use of insulin: Secondary | ICD-10-CM | POA: Diagnosis not present

## 2021-08-08 DIAGNOSIS — E119 Type 2 diabetes mellitus without complications: Secondary | ICD-10-CM | POA: Diagnosis not present

## 2021-08-19 ENCOUNTER — Ambulatory Visit (INDEPENDENT_AMBULATORY_CARE_PROVIDER_SITE_OTHER): Payer: Medicare HMO

## 2021-08-19 DIAGNOSIS — Z95820 Peripheral vascular angioplasty status with implants and grafts: Secondary | ICD-10-CM | POA: Diagnosis not present

## 2021-08-19 DIAGNOSIS — I739 Peripheral vascular disease, unspecified: Secondary | ICD-10-CM

## 2021-08-22 ENCOUNTER — Encounter: Payer: Self-pay | Admitting: Endocrinology

## 2021-08-22 ENCOUNTER — Ambulatory Visit (INDEPENDENT_AMBULATORY_CARE_PROVIDER_SITE_OTHER): Payer: Medicare HMO | Admitting: Endocrinology

## 2021-08-22 VITALS — BP 122/64 | HR 95 | Ht 70.0 in | Wt 201.4 lb

## 2021-08-22 DIAGNOSIS — E11649 Type 2 diabetes mellitus with hypoglycemia without coma: Secondary | ICD-10-CM

## 2021-08-22 DIAGNOSIS — E782 Mixed hyperlipidemia: Secondary | ICD-10-CM

## 2021-08-22 DIAGNOSIS — I10 Essential (primary) hypertension: Secondary | ICD-10-CM

## 2021-08-22 LAB — POCT GLYCOSYLATED HEMOGLOBIN (HGB A1C): Hemoglobin A1C: 7 % — AB (ref 4.0–5.6)

## 2021-08-22 MED ORDER — TRULICITY 1.5 MG/0.5ML ~~LOC~~ SOAJ: SUBCUTANEOUS | 1 refills | Status: DC

## 2021-08-22 NOTE — Patient Instructions (Addendum)
Walk daily  Reduce N to 120 units and if still getting <100 overnite after 1 week go down to 100 units  R insulin 20 before supper  After finishing the 7.82 mg Trulicity increase the dose to 1.5 mg, prescription sent

## 2021-08-22 NOTE — Progress Notes (Unsigned)
Patient ID: Anthony Cuevas, male   DOB: [birth year], 71 y.o.   MRN: [id number]           Reason for Appointment: Type II Diabetes follow-up   History of Present Illness   Diagnosis date: 1992  Previous history:  Non-insulin hypoglycemic drugs previously used: Actos, metformin stopped in 5/23, glipizide Insulin was started in 2006 and has taken Lantus, NovoLog as well as NPH and regular  A1c range in the last few years is: 6.6-9.9  Recent history:     Non-insulin hypoglycemic drugs: Trulicity 1.95 mg weekly     Insulin regimen: NPH 160 units in a.m., regular insulin 25 units at supper          Side effects from medications: None  Current self management, blood sugar patterns and problems identified:  A1c is 7, previously 8.3 He was started on Trulicity in 0/9326 and since then his blood sugars appear to be improving, insulin was not adjusted at this time Although he has had tendency to hypoglycemia at least in the last week or so he does not have a lot of symptoms with low sugars except mild dizziness He says that his freestyle Elenor Legato is fairly close to his fingersticks if he checks them periodically and usually not more than 4-10 mg difference; blood sugar in the office was about 10 mg lower with his libre compared to fingerstick at the same time He appears to have lost some weight also recently More recently he has fairly good blood sugars at all times without usually any high readings after meals Only once he appears to have high readings overnight and for some time during the day possibly from forgetting one of his insulin doses  Exercise: none  Diet management: Usually diet drinks     Hypoglycemia:  none    Freestyle libre version 2 download shows the following interpretation  Moderate variability is present at all times Since 7/17 he has had tendency to blood sugars below 71 this is primarily overnight but sometimes as long as early afternoon with LOWEST blood sugars  around 4-6 AM Overnight blood sugars generally are low normal with only occasionally higher readings around midnight and as above hypoglycemia almost every night for about 4 nights in the last week  POSTPRANDIAL blood sugars may be spiking occasionally midday Otherwise hyperglycemia is seen mostly on 7/23 overnight  CGM use % of time 83  2-week average/GV 115/42  Time in range      73%  % Time Above 180 7  % Time above 250 2  % Time Below 70/54 11+7     PRE-MEAL Fasting Lunch Dinner 4-6 AM Overall  Glucose range:       Averages: 104   93    POST-MEAL PC Breakfast PC Lunch PC Dinner  Glucose range:     Averages: 110 130 120    Dietician visit: Most recent:      Weight control: Max 250  Wt Readings from Last 3 Encounters:  08/22/21 201 lb 6.4 oz (91.4 kg)  07/13/21 202 lb 4 oz (91.7 kg)  07/11/21 205 lb (93 kg)            Diabetes labs:  Lab Results  Component Value Date   HGBA1C 7.0 (A) 08/22/2021   HGBA1C 8.3 (A) 04/26/2021   HGBA1C 9.2 (A) 02/22/2021   Lab Results  Component Value Date   MICROALBUR 5.7 (H) 05/30/2010   LDLCALC 45 09/28/2020   CREATININE 1.05 01/03/2021  No results found for: "FRUCTOSAMINE"   Allergies as of 08/22/2021       Reactions   Zanaflex [tizanidine]    Drowsy with med use.  Tolerates flexeril        Medication List        Accurate as of August 22, 2021  9:16 PM. If you have any questions, ask your nurse or doctor.          STOP taking these medications    FreeStyle Libre 14 Day Sensor Misc Stopped by: Elayne Snare, MD   Trulicity 2.67 TI/4.5YK Sopn Generic drug: Dulaglutide Replaced by: Trulicity 1.5 DX/8.3JA Sopn Stopped by: Elayne Snare, MD       TAKE these medications    ascorbic acid 500 MG tablet Commonly known as: VITAMIN C Take 1 tablet (500 mg total) by mouth daily.   clopidogrel 75 MG tablet Commonly known as: PLAVIX Take 1 tablet (75 mg total) by mouth daily.   cyanocobalamin 1000 MCG  tablet Take 1,000 mcg by mouth daily.   cyclobenzaprine 10 MG tablet Commonly known as: FLEXERIL TAKE 1 TABLET TWICE A DAY AS NEEDED FOR MUSCLE SPASMS (SEDATION CAUTION)   ezetimibe 10 MG tablet Commonly known as: ZETIA Take 1 tablet (10 mg total) by mouth daily.   FISH OIL PO Take 1,000 mg by mouth once a week.   Iron (Ferrous Sulfate) 325 (65 Fe) MG Tabs Take 325 mg by mouth every other day.   latanoprost 0.005 % ophthalmic solution Commonly known as: XALATAN Place 1 drop into both eyes at bedtime.   lisinopril 40 MG tablet Commonly known as: ZESTRIL TAKE 1 TABLET DAILY   NovoLIN N FlexPen 100 UNIT/ML Kiwkpen Generic drug: Insulin NPH (Human) (Isophane) Inject 160 Units into the skin every morning. and pen needles 3/day.   NovoLIN R FlexPen ReliOn 100 UNIT/ML KwikPen Generic drug: Insulin Regular Human Inject 25 Units as directed daily with supper.   OneTouch Ultra test strip Generic drug: glucose blood USE AS INSTRUCTED TO TEST BLOOD SUGARS 3 OR 4 TIMES DAILY   rosuvastatin 20 MG tablet Commonly known as: CRESTOR Take 1 tablet (20 mg total) by mouth daily.   Sure Comfort Pen Needles 32G X 4 MM Misc Generic drug: Insulin Pen Needle USE THREE TIMES A DAY   traMADol 50 MG tablet Commonly known as: ULTRAM Take 1 tablet (50 mg total) by mouth 3 (three) times daily as needed for moderate pain.   Trulicity 1.5 SN/0.5LZ Sopn Generic drug: Dulaglutide Inject weekly Replaces: Trulicity 7.67 HA/1.9FX Sopn Started by: Elayne Snare, MD   Vitamin D3 125 MCG (5000 UT) Caps Take 5,000 Units by mouth 2 (two) times a week.        Allergies:  Allergies  Allergen Reactions   Zanaflex [Tizanidine]     Drowsy with med use.  Tolerates flexeril    Past Medical History:  Diagnosis Date   Arthritis    Coronary atherosclerosis    a. 07/2018 CT Chest: 3 vessel coronary atherosclerosis; b. 07/2018 MV: EF 46% (GI uptake artifact), No ischemia/infarct. Mild to mod diffuse Ao  atherosclerosis and at least moderate 2 vessel CAD (LAD/RCA) on CT imaging. Low risk.   Depression    improved on sertraline   Diabetes mellitus, type 2 ([place]) 09/1996   GERD (gastroesophageal reflux disease) 1990   History of peptic ulcer disease    Hyperlipidemia 1992   Hypertension 05/2003   Lumbar stenosis L2-3   Muscle atrophy    in arms  from degenerative changes in neck   Neck pain    chronic   Smoker    Wears glasses     Past Surgical History:  Procedure Laterality Date   ABDOMINAL AORTOGRAM W/LOWER EXTREMITY N/A 09/03/2019   Procedure: ABDOMINAL AORTOGRAM W/LOWER EXTREMITY;  Surgeon: Wellington Hampshire, MD;  Location: [facility];  Service: Cardiovascular;  Laterality: N/A;  Limited Study   CERVICAL DISC SURGERY  04/19/2002   fusion, Dr. Lorin Mercy   CERVICAL DISCECTOMY  01/31/1995   partial   CERVICAL DISCECTOMY  01/30/1998   COLONOSCOPY WITH ESOPHAGOGASTRODUODENOSCOPY (EGD)  06/25/2015   ENTEROSCOPY N/A 07/04/2021   Procedure: ENTEROSCOPY;  Surgeon: Doran Stabler, MD;  Location: [facility];  Service: Gastroenterology;  Laterality: N/A;   HEMORRHOID SURGERY  01/31/1987   HOT HEMOSTASIS N/A 07/04/2021   Procedure: HOT HEMOSTASIS (ARGON PLASMA COAGULATION/BICAP);  Surgeon: Doran Stabler, MD;  Location: [facility];  Service: Gastroenterology;  Laterality: N/A;   KNEE ARTHROPLASTY  11/20/2011   Procedure: COMPUTER ASSISTED TOTAL KNEE ARTHROPLASTY;  Surgeon: Marybelle Killings, MD;  Location: [facility];  Service: Orthopedics;  Laterality: Left;  Conversion Left Knee Medial Uni to Total Knee Arthroplasty-Cemented   LUMBAR LAMINECTOMY/DECOMPRESSION MICRODISCECTOMY N/A 04/28/2015   Procedure: L2-3 Decompression;  Surgeon: Marybelle Killings, MD;  Location: [facility];  Service: Orthopedics;  Laterality: N/A;   MR MRA DUPLICATE EXAM  INACTIVATE     MULTIPLE TOOTH EXTRACTIONS     Neisen funduplication  81/85/6314   PERIPHERAL VASCULAR INTERVENTION  09/03/2019   Procedure: PERIPHERAL  VASCULAR INTERVENTION;  Surgeon: Wellington Hampshire, MD;  Location: [facility];  Service: Cardiovascular;;  Iliac Stent   REPLACEMENT TOTAL KNEE Left 02/14/02, 05/04/02   left- partial   SCC EXCISION  04/25/2001   Dr. March Rummage   STOMACH SURGERY  01/30/1982   [facility], [place]   SUBMUCOSAL TATTOO INJECTION  07/04/2021   Procedure: SUBMUCOSAL TATTOO INJECTION;  Surgeon: Doran Stabler, MD;  Location: [facility];  Service: Gastroenterology;;    Family History  Problem Relation Age of Onset   Diabetes Mother    Hypertension Mother    Stroke Mother    Diabetes Father    Diabetes Sister    Diabetes Brother    Diabetes Brother    Heart disease Brother    Breast cancer Maternal Grandmother    Lung disease Paternal Grandfather        black lung   Heart disease Son    Arthritis Neg Hx    Prostate cancer Neg Hx    Colon cancer Neg Hx    Stomach cancer Neg Hx     Social History:  reports that he quit smoking about 9 years ago. His smoking use included cigarettes. He has a 41.00 pack-year smoking history. He has never used smokeless tobacco. He reports that he does not drink alcohol and does not use drugs.  Review of Systems:  Last diabetic eye exam date 7/22  Last foot exam date: 10/22  Symptoms of neuropathy: None  Hypertension:   Treatment includes Lisinopril 40 mg daily  Home BP wrist instrument readings and appear to be in the normal range by history Blood pressure was checked second time and was not low  BP Readings from Last 3 Encounters:  08/22/21 122/64  07/13/21 (!) 100/50  07/11/21 140/62    Lipid management: Treated with Crestor and Zetia by his cardiologist    Lab Results  Component Value Date  CHOL 108 09/28/2020   CHOL 135 12/05/2019   CHOL 144 12/04/2018   Lab Results  Component Value Date   HDL 30 (L) 09/28/2020   HDL 34.10 (L) 12/05/2019   HDL 31.00 (L) 12/04/2018   Lab Results  Component Value Date   LDLCALC 45 09/28/2020   LDLCALC 68 07/13/2014    LDLCALC 65 06/05/2013   Lab Results  Component Value Date   TRIG 202 (H) 09/28/2020   TRIG 204.0 (H) 12/05/2019   TRIG 254.0 (H) 12/04/2018   Lab Results  Component Value Date   CHOLHDL 3.6 09/28/2020   CHOLHDL 4 12/05/2019   CHOLHDL 5 12/04/2018   Lab Results  Component Value Date   LDLDIRECT 78.0 12/05/2019   LDLDIRECT 71.0 12/04/2018   LDLDIRECT 79.0 11/26/2017     Examination:   BP 122/64 (Patient Position: Standing)   Pulse 95   Ht '5\' 10"'$  (1.778 m)   Wt 201 lb 6.4 oz (91.4 kg)   SpO2 98%   BMI 28.90 kg/m   Body mass index is 28.9 kg/m.    ASSESSMENT/ PLAN:    Diabetes type 2 on insulin:   Current regimen: NPH and regular insulin, Trulicity 6.38 mg weekly  See history of present illness for detailed discussion of current diabetes management, blood sugar patterns and problems identified  A1c is improving at 7%  Blood glucose control appears dramatically better with adding in low-dose Trulicity Previously on high-dose insulin doses especially NPH With adding Trulicity at least recently is getting excessive hypoglycemia including overnight and morning hours likely from the NPH insulin which also may be acting for over 24 hours given the very large dose he is taking  Recommendations:  Periodically check fingerstick blood sugar with freestyle libre REDUCE insulin doses as follows:  Reduce N to 120 units and if still getting <100 overnite after 1 week go down to 100 units  R insulin 20 before supper  After finishing the 1.77 mg Trulicity increase the dose to 1.5 mg, prescription sent to [facility]  Set aside time to walk for exercise daily  Healthy low-fat diet consistently  He will also attend diabetes counseling session with the CDE and also needs review of meal planning  Urine microalbumin needs to be checked Also will need follow-up both fasting lipid panel for mixed hyperlipidemia   Patient Instructions  Walk daily  Reduce N to 120  units and if still getting <100 overnite after 1 week go down to 100 units  R insulin 20 before supper  After finishing the 1.16 mg Trulicity increase the dose to 1.5 mg, prescription sent   Elayne Snare 08/22/2021, 9:16 PM

## 2021-08-23 DIAGNOSIS — H401131 Primary open-angle glaucoma, bilateral, mild stage: Secondary | ICD-10-CM | POA: Diagnosis not present

## 2021-08-23 DIAGNOSIS — H2513 Age-related nuclear cataract, bilateral: Secondary | ICD-10-CM | POA: Diagnosis not present

## 2021-08-23 DIAGNOSIS — H524 Presbyopia: Secondary | ICD-10-CM | POA: Diagnosis not present

## 2021-08-23 DIAGNOSIS — E119 Type 2 diabetes mellitus without complications: Secondary | ICD-10-CM | POA: Diagnosis not present

## 2021-08-23 DIAGNOSIS — H5203 Hypermetropia, bilateral: Secondary | ICD-10-CM | POA: Diagnosis not present

## 2021-08-23 LAB — BASIC METABOLIC PANEL
BUN: 19 mg/dL (ref 6–23)
CO2: 29 mEq/L (ref 19–32)
Calcium: 9.7 mg/dL (ref 8.4–10.5)
Chloride: 102 mEq/L (ref 96–112)
Creatinine, Ser: 1.06 mg/dL (ref 0.40–1.50)
GFR: 71.53 mL/min (ref >60.00)
Glucose, Bld: 102 mg/dL — ABNORMAL HIGH (ref 70–99)
Potassium: 5 mEq/L (ref 3.5–5.1)
Sodium: 138 mEq/L (ref 135–145)

## 2021-08-23 LAB — HM DIABETES EYE EXAM

## 2021-08-24 ENCOUNTER — Other Ambulatory Visit (INDEPENDENT_AMBULATORY_CARE_PROVIDER_SITE_OTHER): Payer: Medicare HMO

## 2021-08-24 DIAGNOSIS — D649 Anemia, unspecified: Secondary | ICD-10-CM

## 2021-08-24 LAB — CBC WITH DIFFERENTIAL/PLATELET
Basophils Absolute: 0 10*3/uL (ref 0.0–0.1)
Basophils Relative: 0.6 % (ref 0.0–3.0)
Eosinophils Absolute: 0.1 10*3/uL (ref 0.0–0.7)
Eosinophils Relative: 2.8 % (ref 0.0–5.0)
HCT: 37.7 % — ABNORMAL LOW (ref 39.0–52.0)
Hemoglobin: 12.8 g/dL — ABNORMAL LOW (ref 13.0–17.0)
Lymphocytes Relative: 21.9 % (ref 12.0–46.0)
Lymphs Abs: 1.1 10*3/uL (ref 0.7–4.0)
MCHC: 34.1 g/dL (ref 30.0–36.0)
MCV: 92.8 fl (ref 78.0–100.0)
Monocytes Absolute: 0.8 10*3/uL (ref 0.1–1.0)
Monocytes Relative: 17.2 % — ABNORMAL HIGH (ref 3.0–12.0)
Neutro Abs: 2.8 10*3/uL (ref 1.4–7.7)
Neutrophils Relative %: 57.5 % (ref 43.0–77.0)
Platelets: 133 10*3/uL — ABNORMAL LOW (ref 150.0–400.0)
RBC: 4.06 Mil/uL — ABNORMAL LOW (ref 4.22–5.81)
RDW: 15.2 % (ref 11.5–15.5)
WBC: 4.9 10*3/uL (ref 4.0–10.5)

## 2021-08-24 LAB — IRON: Iron: 91 ug/dL (ref 42–165)

## 2021-08-25 ENCOUNTER — Other Ambulatory Visit: Payer: Self-pay

## 2021-08-25 DIAGNOSIS — I739 Peripheral vascular disease, unspecified: Secondary | ICD-10-CM

## 2021-08-26 ENCOUNTER — Other Ambulatory Visit: Payer: Self-pay | Admitting: Family Medicine

## 2021-08-28 ENCOUNTER — Other Ambulatory Visit: Payer: Self-pay | Admitting: Family Medicine

## 2021-08-28 DIAGNOSIS — D649 Anemia, unspecified: Secondary | ICD-10-CM

## 2021-08-29 ENCOUNTER — Encounter: Payer: Self-pay | Admitting: Endocrinology

## 2021-09-07 DIAGNOSIS — E119 Type 2 diabetes mellitus without complications: Secondary | ICD-10-CM | POA: Diagnosis not present

## 2021-09-07 DIAGNOSIS — Z794 Long term (current) use of insulin: Secondary | ICD-10-CM | POA: Diagnosis not present

## 2021-09-14 ENCOUNTER — Telehealth: Payer: Self-pay | Admitting: Endocrinology

## 2021-09-14 ENCOUNTER — Ambulatory Visit (INDEPENDENT_AMBULATORY_CARE_PROVIDER_SITE_OTHER): Payer: Medicare HMO | Admitting: Podiatry

## 2021-09-14 ENCOUNTER — Encounter: Payer: Self-pay | Admitting: Podiatry

## 2021-09-14 DIAGNOSIS — M79676 Pain in unspecified toe(s): Secondary | ICD-10-CM | POA: Diagnosis not present

## 2021-09-14 DIAGNOSIS — B351 Tinea unguium: Secondary | ICD-10-CM

## 2021-09-14 DIAGNOSIS — E119 Type 2 diabetes mellitus without complications: Secondary | ICD-10-CM

## 2021-09-14 DIAGNOSIS — M2010 Hallux valgus (acquired), unspecified foot: Secondary | ICD-10-CM

## 2021-09-14 NOTE — Progress Notes (Signed)
This patient returns to my office for at risk foot care.  This patient requires this care by a professional since this patient will be at risk due to having type 2 diabetes.  This patient is unable to cut nails himself since the patient cannot reach his nails.These nails are painful walking and wearing shoes.  This patient presents for at risk foot care today.  General Appearance  Alert, conversant and in no acute stress.  Vascular  Dorsalis pedis and posterior tibial  pulses are palpable  bilaterally.  Capillary return is within normal limits  bilaterally. Temperature is within normal limits  bilaterally.  Neurologic  Senn-Weinstein monofilament wire test diminished  bilaterally. Muscle power within normal limits bilaterally.  Nails Thick disfigured discolored nails with subungual debris  from hallux to fifth toes bilaterally. No evidence of bacterial infection or drainage bilaterally.  Orthopedic  No limitations of motion  feet .  No crepitus or effusions noted.  No bony pathology or digital deformities noted.  HAV  B/L.  Skin  normotropic skin with no porokeratosis noted bilaterally.  No signs of infections or ulcers noted.     Onychomycosis  Pain in right toes  Pain in left toes  Consent was obtained for treatment procedures.   Mechanical debridement of nails 1-5  bilaterally performed with a nail nipper.  Filed with dremel without incident.    Return office visit    12  weeks                  Told patient to return for periodic foot care and evaluation due to potential at risk complications.   Coraline Talwar DPM  

## 2021-09-14 NOTE — Telephone Encounter (Signed)
Patient dropped off therapeutic foot wear form.  Patient advised 5-7 business for turn around (max) and that we would call him when ready. Patient advised "no, I will be in tomorrow to get it"  Paper work placed in MD box at front

## 2021-09-15 NOTE — Telephone Encounter (Signed)
Paperwork has been received, will place on providers desk for signature.

## 2021-09-17 ENCOUNTER — Other Ambulatory Visit: Payer: Self-pay | Admitting: Family Medicine

## 2021-09-19 NOTE — Telephone Encounter (Signed)
Refill request for TRAMADOL HCL TABS '50MG'$   LOV - 07/11/21 Next OV - not scheduled Last refill - 07/11/21 #270/1

## 2021-09-20 NOTE — Telephone Encounter (Signed)
#  [phone] filled on 07/17/21, no other recent fills.  Sent. Thanks.

## 2021-09-21 ENCOUNTER — Telehealth: Payer: Self-pay | Admitting: Podiatry

## 2021-09-21 NOTE — Telephone Encounter (Signed)
Left message for patient to advise that the paperwork has not been received from Dr. Dwyane Dee. He can contact Dr. Dwyane Dee or come by the office to pick up the paperwork and take to Dr. Dwyane Dee office

## 2021-09-23 NOTE — Telephone Encounter (Signed)
Patient called to check status on this paperwork.

## 2021-09-28 ENCOUNTER — Encounter: Payer: Self-pay | Admitting: Podiatry

## 2021-09-28 NOTE — Telephone Encounter (Signed)
Noted and form sent to [facility]

## 2021-10-07 DIAGNOSIS — Z794 Long term (current) use of insulin: Secondary | ICD-10-CM | POA: Diagnosis not present

## 2021-10-07 DIAGNOSIS — E119 Type 2 diabetes mellitus without complications: Secondary | ICD-10-CM | POA: Diagnosis not present

## 2021-10-12 ENCOUNTER — Ambulatory Visit: Payer: Medicare HMO | Attending: Cardiovascular Disease | Admitting: Cardiovascular Disease

## 2021-10-12 ENCOUNTER — Other Ambulatory Visit
Admission: RE | Admit: 2021-10-12 | Discharge: 2021-10-12 | Disposition: A | Discharge status: Home / Self Care | Payer: Medicare HMO | Attending: Cardiovascular Disease | Admitting: Cardiovascular Disease

## 2021-10-12 ENCOUNTER — Encounter: Payer: Self-pay | Admitting: Cardiovascular Disease

## 2021-10-12 VITALS — BP 110/60 | HR 102 | Ht 70.0 in | Wt 199.5 lb

## 2021-10-12 DIAGNOSIS — E785 Hyperlipidemia, unspecified: Secondary | ICD-10-CM

## 2021-10-12 DIAGNOSIS — I739 Peripheral vascular disease, unspecified: Secondary | ICD-10-CM | POA: Diagnosis not present

## 2021-10-12 DIAGNOSIS — Z95828 Presence of other vascular implants and grafts: Secondary | ICD-10-CM

## 2021-10-12 DIAGNOSIS — I2510 Atherosclerotic heart disease of native coronary artery without angina pectoris: Secondary | ICD-10-CM | POA: Diagnosis not present

## 2021-10-12 DIAGNOSIS — E782 Mixed hyperlipidemia: Secondary | ICD-10-CM | POA: Diagnosis not present

## 2021-10-12 DIAGNOSIS — E11649 Type 2 diabetes mellitus with hypoglycemia without coma: Secondary | ICD-10-CM

## 2021-10-12 DIAGNOSIS — I10 Essential (primary) hypertension: Secondary | ICD-10-CM

## 2021-10-12 DIAGNOSIS — D649 Anemia, unspecified: Secondary | ICD-10-CM

## 2021-10-12 LAB — HEPATIC FUNCTION PANEL
ALT: 30 U/L (ref 0–44)
AST: 31 U/L (ref 15–41)
Albumin: 4 g/dL (ref 3.5–5.0)
Alkaline Phosphatase: 56 U/L (ref 38–126)
Bilirubin, Direct: 0.1 mg/dL (ref 0.0–0.2)
Indirect Bilirubin: 0.3 mg/dL (ref 0.3–0.9)
Total Bilirubin: 0.4 mg/dL (ref 0.3–1.2)
Total Protein: 7.1 g/dL (ref 6.5–8.1)

## 2021-10-12 LAB — LIPID PANEL
Cholesterol: 82 mg/dL (ref 0–200)
HDL: 30 mg/dL — ABNORMAL LOW (ref >40)
LDL Cholesterol: 14 mg/dL (ref 0–99)
Total CHOL/HDL Ratio: 2.7 RATIO
Triglycerides: 189 mg/dL — ABNORMAL HIGH (ref <150)
VLDL: 38 mg/dL (ref 0–40)

## 2021-10-12 MED ORDER — LISINOPRIL 40 MG PO TABS: 40.0000 mg | ORAL_TABLET | Freq: Every day | ORAL | 3 refills | Status: DC

## 2021-10-12 MED ORDER — ROSUVASTATIN CALCIUM 20 MG PO TABS: 20.0000 mg | ORAL_TABLET | Freq: Every day | ORAL | 3 refills | Status: DC

## 2021-10-12 MED ORDER — CLOPIDOGREL BISULFATE 75 MG PO TABS: 75.0000 mg | ORAL_TABLET | Freq: Every day | ORAL | 3 refills | Status: DC

## 2021-10-12 MED ORDER — EZETIMIBE 10 MG PO TABS: 10.0000 mg | ORAL_TABLET | Freq: Every day | ORAL | 3 refills | Status: DC

## 2021-10-12 NOTE — Patient Instructions (Signed)
Medication Instructions:  No changes  If you need a refill on your cardiac medications before your next appointment, please call your pharmacy.   Lab work: LFTs and lipids  Testing/Procedures: No new testing needed  Follow-Up: At [facility], you and your health needs are our priority.  As part of our continuing mission to provide you with exceptional heart care, we have created designated Provider Care Teams.  These Care Teams include your primary Cardiologist (physician) and Advanced Practice Providers (APPs -  Physician Assistants and Nurse Practitioners) who all work together to provide you with the care you need, when you need it.  You will need a follow up appointment in 12 months  Providers on your designated Care Team:   Murray Hodgkins, NP Christell Faith, PA-C Cadence Kathlen Mody, [place]  COVID-19 Vaccine Information can be found at: [email or web address].uk For questions related to vaccine distribution or appointments, please email [email or web address] or call [phone].

## 2021-10-12 NOTE — Addendum Note (Signed)
Addended by: Valora Corporal on: 10/12/2021 10:47 AM   Modules accepted: Orders

## 2021-10-12 NOTE — Progress Notes (Signed)
Date:  10/12/2021   ID:  Anthony Cuevas, DOB 03/23/1950, MRN [id number]  Patient Location:  [place] [address]   Provider location:   [facility], [place] office  PCP:  Tonia Ghent, MD  Cardiologist:  Patsy Baltimore  Chief Complaint  Patient presents with   12 month follow up     "Doing well." Medications reviewed by the patient verbally.     History of Present Illness:    Anthony Cuevas is a 71 y.o. male  past medical history of former smoker, >20 years, quit >8 years ago Coronary artery disease on CT scan Hyperlipidemia Diabetes II PAD, stent to left LE Presents for f/u of his  coronary artery disease, shortness of breath, PAD  Last seen by myself in clinic 6/23 In follow-up today reports feeling well Reports he has changed his diet, A1c slowly trending down A1C 7.0 Down from 9.0  Denies any claudication symptoms Lower extremity arterial Dopplers July 2023, stable ABIs Followed by Dr. Fletcher Anon  Spends lots of time working outside Prior clinic visit blood pressure low, heart rate elevated, concern for dehydration Heart rate 100 at rest today He will start monitoring with blood pressure cuff at home  EKG personally reviewed by myself on todays visit Sinus tachycardia rate 102 bpm no significant ST-T wave changes, PVCs  Other past medical history reviewed Prior angiography which showed moderate right common iliac artery stenosis, occluded and heavily calcified left common iliac artery in a long segment.   -successful angioplasty and covered stent placement to the left common iliac artery.  Postprocedure vascular studies showed improvement in ABI to normal with patent iliac stent and moderately elevated velocities just distal to the stent.  Prior imaging reviewed CT scan chest July 29, 2018 with at least moderate three-vessel coronary atherosclerosis, Moderate diffuse aortic  atherosclerosis Emphysema   Past Medical History:  Diagnosis Date   Arthritis    Coronary atherosclerosis    a. 07/2018 CT Chest: 3 vessel coronary atherosclerosis; b. 07/2018 MV: EF 46% (GI uptake artifact), No ischemia/infarct. Mild to mod diffuse Ao atherosclerosis and at least moderate 2 vessel CAD (LAD/RCA) on CT imaging. Low risk.   Depression    improved on sertraline   Diabetes mellitus, type 2 ([place]) 09/1996   GERD (gastroesophageal reflux disease) 1990   History of peptic ulcer disease    Hyperlipidemia 1992   Hypertension 05/2003   Lumbar stenosis L2-3   Muscle atrophy    in arms from degenerative changes in neck   Neck pain    chronic   Smoker    Wears glasses    Past Surgical History:  Procedure Laterality Date   ABDOMINAL AORTOGRAM W/LOWER EXTREMITY N/A 09/03/2019   Procedure: ABDOMINAL AORTOGRAM W/LOWER EXTREMITY;  Surgeon: Wellington Hampshire, MD;  Location: [facility];  Service: Cardiovascular;  Laterality: N/A;  Limited Study   CERVICAL DISC SURGERY  04/19/2002   fusion, Dr. Lorin Mercy   CERVICAL DISCECTOMY  01/31/1995   partial   CERVICAL DISCECTOMY  01/30/1998   COLONOSCOPY WITH ESOPHAGOGASTRODUODENOSCOPY (EGD)  06/25/2015   ENTEROSCOPY N/A 07/04/2021   Procedure: ENTEROSCOPY;  Surgeon: Doran Stabler, MD;  Location: [facility];  Service: Gastroenterology;  Laterality: N/A;   HEMORRHOID SURGERY  01/31/1987   HOT HEMOSTASIS N/A 07/04/2021   Procedure: HOT HEMOSTASIS (ARGON PLASMA COAGULATION/BICAP);  Surgeon: Doran Stabler, MD;  Location: [facility];  Service: Gastroenterology;  Laterality: N/A;   KNEE ARTHROPLASTY  11/20/2011   Procedure: COMPUTER ASSISTED TOTAL KNEE ARTHROPLASTY;  Surgeon: Marybelle Killings, MD;  Location: [facility];  Service: Orthopedics;  Laterality: Left;  Conversion Left Knee Medial Uni to Total Knee Arthroplasty-Cemented   LUMBAR LAMINECTOMY/DECOMPRESSION MICRODISCECTOMY N/A 04/28/2015   Procedure: L2-3 Decompression;  Surgeon: Marybelle Killings, MD;  Location: [facility];  Service: Orthopedics;  Laterality: N/A;   MR MRA DUPLICATE EXAM  INACTIVATE     MULTIPLE TOOTH EXTRACTIONS     Neisen funduplication  36/62/9476   PERIPHERAL VASCULAR INTERVENTION  09/03/2019   Procedure: PERIPHERAL VASCULAR INTERVENTION;  Surgeon: Wellington Hampshire, MD;  Location: [facility];  Service: Cardiovascular;;  Iliac Stent   REPLACEMENT TOTAL KNEE Left 02/14/02, 05/04/02   left- partial   SCC EXCISION  04/25/2001   Dr. March Rummage   STOMACH SURGERY  01/30/1982   PUD, [place]   SUBMUCOSAL TATTOO INJECTION  07/04/2021   Procedure: SUBMUCOSAL TATTOO INJECTION;  Surgeon: Doran Stabler, MD;  [facility]: [facility];  Service: Gastroenterology;;     Current Meds  Medication Sig   ascorbic acid (VITAMIN C) 500 MG tablet Take 1 tablet (500 mg total) by mouth daily.   Cholecalciferol (VITAMIN D3) 125 MCG (5000 UT) CAPS Take 5,000 Units by mouth 2 (two) times a week.   clopidogrel (PLAVIX) 75 MG tablet Take 1 tablet (75 mg total) by mouth daily.   cyanocobalamin 1000 MCG tablet Take 1,000 mcg by mouth daily.   cyclobenzaprine (FLEXERIL) 10 MG tablet TAKE 1 TABLET TWICE A DAY AS NEEDED FOR MUSCLE SPASMS (SEDATION CAUTION)   Dulaglutide (TRULICITY) 1.5 LY/6.5KP SOPN Inject weekly   ezetimibe (ZETIA) 10 MG tablet Take 1 tablet (10 mg total) by mouth daily.   Insulin NPH, Human,, Isophane, (NOVOLIN N FLEXPEN) 100 UNIT/ML Kiwkpen Inject 160 Units into the skin every morning. and pen needles 3/day. (Patient taking differently: Inject 120 Units into the skin every morning. and pen needles 3/day.)   Insulin Regular Human (NOVOLIN R FLEXPEN RELION) 100 UNIT/ML KwikPen Inject 25 Units as directed daily with supper.   Iron, Ferrous Sulfate, 325 (65 Fe) MG TABS Take 325 mg by mouth every other day.   latanoprost (XALATAN) 0.005 % ophthalmic solution Place 1 drop into both eyes at bedtime.   lisinopril (ZESTRIL) 40 MG tablet TAKE 1 TABLET DAILY   Omega-3 Fatty Acids (FISH  OIL PO) Take 1,000 mg by mouth once a week.   ONETOUCH ULTRA test strip USE AS INSTRUCTED TO TEST BLOOD SUGARS 3 OR 4 TIMES DAILY   rosuvastatin (CRESTOR) 20 MG tablet Take 1 tablet (20 mg total) by mouth daily.   SURE COMFORT PEN NEEDLES 32G X 4 MM MISC USE THREE TIMES A DAY   traMADol (ULTRAM) 50 MG tablet TAKE 1 TABLET THREE TIMES A DAY AS NEEDED FOR MODERATE PAIN     Allergies:   Patient has no known allergies.   Social History   Tobacco Use   Smoking status: Former    Packs/day: 1.00    Years: 41.00    Total pack years: 41.00    Types: Cigarettes    Quit date: 2014    Years since quitting: 9.7   Smokeless tobacco: Never  Vaping Use   Vaping Use: Never used  Substance Use Topics   Alcohol use: No    Alcohol/week: 0.0 standard drinks of alcohol   Drug use: Never     Family Hx: The patient's family  history includes Breast cancer in his maternal grandmother; Diabetes in his brother, brother, father, mother, and sister; Heart disease in his brother and son; Hypertension in his mother; Lung disease in his paternal grandfather; Stroke in his mother. There is no history of Arthritis, Prostate cancer, Colon cancer, or Stomach cancer.  ROS:   Please see the history of present illness.    Review of Systems  Constitutional: Negative.   HENT: Negative.    Respiratory:  Positive for shortness of breath.   Cardiovascular: Negative.   Gastrointestinal: Negative.   Musculoskeletal: Negative.   Neurological: Negative.   Psychiatric/Behavioral: Negative.    All other systems reviewed and are negative.    Labs/Other Tests and Data Reviewed:    Recent Labs: 01/03/2021: TSH 3.27 08/22/2021: BUN 19; Creatinine, Ser 1.06; Potassium 5.0; Sodium 138 08/24/2021: Hemoglobin 12.8; Platelets 133.0   Recent Lipid Panel Lab Results  Component Value Date/Time   CHOL 108 09/28/2020 02:52 PM   TRIG 202 (H) 09/28/2020 02:52 PM   HDL 30 (L) 09/28/2020 02:52 PM   CHOLHDL 3.6 09/28/2020 02:52 PM    CHOLHDL 4 12/05/2019 07:53 AM   LDLCALC 45 09/28/2020 02:52 PM   LDLDIRECT 78.0 12/05/2019 07:53 AM    Wt Readings from Last 3 Encounters:  10/12/21 199 lb 8 oz (90.5 kg)  08/22/21 201 lb 6.4 oz (91.4 kg)  07/13/21 202 lb 4 oz (91.7 kg)     Exam:    Vital Signs: Vital signs may also be detailed in the HPI BP 110/60 (BP Location: Left Arm, Patient Position: Sitting, Cuff Size: Normal)   Pulse (!) 102   Ht '5\' 10"'$  (1.778 m)   Wt 199 lb 8 oz (90.5 kg)   SpO2 98%   BMI 28.63 kg/m   Constitutional:  oriented to person, place, and time. No distress.  HENT:  Head: Grossly normal Eyes:  no discharge. No scleral icterus.  Neck: No JVD, no carotid bruits  Cardiovascular: Regular rate and rhythm, no murmurs appreciated Pulmonary/Chest: Clear to auscultation bilaterally, no wheezes or rails Abdominal: Soft.  no distension.  no tenderness.  Musculoskeletal: Normal range of motion Neurological:  normal muscle tone. Coordination normal. No atrophy Skin: Skin warm and dry Psychiatric: normal affect, pleasant   ASSESSMENT & PLAN:    Lennin was seen today for 12 month follow up .  Diagnoses and all orders for this visit:  PAD (peripheral artery disease) ([place])  Atherosclerosis of native coronary artery of native heart without angina pectoris  Essential hypertension  Hyperlipidemia, unspecified hyperlipidemia type  S/P insertion of iliac artery stent  Claudication in peripheral vascular disease ([place])  Mixed hyperlipidemia  Uncontrolled type 2 diabetes mellitus with hypoglycemia without coma ([place])   CAD with stable angina On plavix, Crestor, Zetia Currently with no symptoms of angina. No further workup at this time. Continue current medication regimen.  PAD No claudication sx.  moderate disease of left external iliac Prior stenting, recent Dopplers showing stable flow Followed by Dr. Fletcher Anon  Hyperlipidemia Continue Zetia with his Crestor Lipids and LFTs  today  Diabetes with complictions Q6V slowly trending downward through dietary changes, weight loss A1c 9 down to 7   Total encounter time more than 30 minutes  Greater than 50% was spent in counseling and coordination of care with the patient   Signed, Ida Rogue, [place] [facility] [address], [place], [place] [address]

## 2021-10-24 ENCOUNTER — Telehealth: Payer: Self-pay | Admitting: *Deleted

## 2021-10-24 NOTE — Telephone Encounter (Signed)
Left message on voicemail to stop taking Zetia 10 mg  continue  taking Zetia . Any question can call back

## 2021-10-24 NOTE — Telephone Encounter (Signed)
-----   Message from Minna Merritts, MD sent at 10/22/2021 11:11 AM EDT ----- Cholesterol results Numbers very low Can probably stop the Zetia continue the Crestor

## 2021-10-31 ENCOUNTER — Other Ambulatory Visit (INDEPENDENT_AMBULATORY_CARE_PROVIDER_SITE_OTHER): Payer: Medicare HMO

## 2021-10-31 ENCOUNTER — Other Ambulatory Visit: Payer: Self-pay | Admitting: Endocrinology

## 2021-10-31 DIAGNOSIS — E11649 Type 2 diabetes mellitus with hypoglycemia without coma: Secondary | ICD-10-CM | POA: Diagnosis not present

## 2021-10-31 LAB — BASIC METABOLIC PANEL
BUN: 11 mg/dL (ref 6–23)
CO2: 30 mEq/L (ref 19–32)
Calcium: 9.7 mg/dL (ref 8.4–10.5)
Chloride: 104 mEq/L (ref 96–112)
Creatinine, Ser: 1.02 mg/dL (ref 0.40–1.50)
GFR: 74.81 mL/min (ref >60.00)
Glucose, Bld: 171 mg/dL — ABNORMAL HIGH (ref 70–99)
Potassium: 4.7 mEq/L (ref 3.5–5.1)
Sodium: 139 mEq/L (ref 135–145)

## 2021-10-31 LAB — MICROALBUMIN / CREATININE URINE RATIO
Creatinine,U: 88.4 mg/dL
Microalb Creat Ratio: 5.1 mg/g (ref 0.0–30.0)
Microalb, Ur: 4.5 mg/dL — ABNORMAL HIGH (ref 0.0–1.9)

## 2021-11-01 LAB — FRUCTOSAMINE: Fructosamine: 335 umol/L — ABNORMAL HIGH (ref 0–285)

## 2021-11-03 ENCOUNTER — Ambulatory Visit (INDEPENDENT_AMBULATORY_CARE_PROVIDER_SITE_OTHER): Payer: Medicare HMO | Admitting: Endocrinology

## 2021-11-03 ENCOUNTER — Ambulatory Visit (INDEPENDENT_AMBULATORY_CARE_PROVIDER_SITE_OTHER): Payer: Medicare HMO

## 2021-11-03 ENCOUNTER — Encounter: Payer: Self-pay | Admitting: Endocrinology

## 2021-11-03 VITALS — BP 140/82 | HR 82 | Ht 70.0 in | Wt 199.0 lb

## 2021-11-03 DIAGNOSIS — Z23 Encounter for immunization: Secondary | ICD-10-CM | POA: Diagnosis not present

## 2021-11-03 DIAGNOSIS — E11649 Type 2 diabetes mellitus with hypoglycemia without coma: Secondary | ICD-10-CM | POA: Diagnosis not present

## 2021-11-03 DIAGNOSIS — I10 Essential (primary) hypertension: Secondary | ICD-10-CM

## 2021-11-03 MED ORDER — TOUJEO MAX SOLOSTAR 300 UNIT/ML ~~LOC~~ SOPN: PEN_INJECTOR | SUBCUTANEOUS | 1 refills | Status: DC

## 2021-11-03 NOTE — Progress Notes (Signed)
Patient ID: Anthony Cuevas, male   DOB: [birth year], 71 y.o.   MRN: [id number]           Reason for Appointment: Type II Diabetes follow-up   History of Present Illness   Diagnosis date: 1992  Previous history:  Non-insulin hypoglycemic drugs previously used: Actos, metformin stopped in 5/23, glipizide Insulin was started in 2006 and has taken Lantus, NovoLog as well as NPH and regular  A1c range in the last few years is: 6.6-9.9  Recent history:     Non-insulin hypoglycemic drugs: Trulicity 1.5 mg weekly     Insulin regimen: NPH 120 units in a.m., regular insulin 20 units at supper          Side effects from medications: None  Current self management, blood sugar patterns and problems identified:  A1c is last 7, previously 8.3 Fructosamine is 335 but his recent blood sugar is averaging nearly 200 He was started on Trulicity in 06/2033 and although previously this improved his blood sugar control his overall control is worse now with marked hyperglycemia at times and no consistent pattern Trulicity was increased in 7/25 and has a 90-day supply He appears to have a relatively balanced meal in the morning but blood sugars may randomly go up after either lunch or dinner Also overnight blood sugars are highly inconsistent for no specific reason He has taken his insulin doses relatively consistently as discussed today Has not missed Trulicity Also no intercurrent illnesses to affect his sugar control  Exercise: none  Diet management: Usually diet drinks  Freestyle libre 2 download for the last 2 weeks shows the following interpretation and data  Significant variability is present at all times especially in the daytime Inconsistent patterns are seen with periods of persistent and significant hypoglycemia at least on a couple of days all the time HYPERGLYCEMIC episodes also occurring periodically midday and afternoon and occasionally late evening without any  consistency Postprandial blood sugars do not show any consistent trend or pattern No hypoglycemia seen but periodically blood sugars appear to decrease very shortly after hyperglycemia May have transiently low blood sugars midmorning or before dinnertime HIGHEST blood sugars on average are close to bedtime and lowest between 4-6 AM Overnight blood sugars are highly variable without hypoglycemia also and averaging just over 180  CGM use % of time 84  2-week average/GV 196/42  Time in range 42  % Time Above 180 28  % Time above 250 28  % Time Below 70 2     PRE-MEAL Fasting Lunch Dinner Bedtime Overall  Glucose range:       Averages: 185 193 193     POST-MEAL PC Breakfast PC Lunch PC Dinner  Glucose range:     Averages: 190 198 226    CGM use % of time 83  2-week average/GV 115/42  Time in range      73%  % Time Above 180 7  % Time above 250 2  % Time Below 70/54 11+7     PRE-MEAL Fasting Lunch Dinner 4-6 AM Overall  Glucose range:       Averages: 104   93    POST-MEAL PC Breakfast PC Lunch PC Dinner  Glucose range:     Averages: 110 130 120    Dietician visit: Most recent:      Weight control: Max 250  Wt Readings from Last 3 Encounters:  11/03/21 199 lb (90.3 kg)  10/12/21 199 lb 8 oz (90.5 kg)  08/22/21  201 lb 6.4 oz (91.4 kg)            Diabetes labs:  Lab Results  Component Value Date   HGBA1C 7.0 (A) 08/22/2021   HGBA1C 8.3 (A) 04/26/2021   HGBA1C 9.2 (A) 02/22/2021   Lab Results  Component Value Date   MICROALBUR 4.5 (H) 10/31/2021   LDLCALC 14 10/12/2021   CREATININE 1.02 10/31/2021    Lab Results  Component Value Date   FRUCTOSAMINE 335 (H) 10/31/2021   Lab Results  Component Value Date   HGB 12.8 (L) 08/24/2021     Allergies as of 11/03/2021   No Known Allergies      Medication List        Accurate as of November 03, 2021  1:25 PM. If you have any questions, ask your nurse or doctor.          ascorbic acid 500 MG  tablet Commonly known as: VITAMIN C Take 1 tablet (500 mg total) by mouth daily.   clopidogrel 75 MG tablet Commonly known as: PLAVIX Take 1 tablet (75 mg total) by mouth daily.   cyanocobalamin 1000 MCG tablet Take 1,000 mcg by mouth daily.   cyclobenzaprine 10 MG tablet Commonly known as: FLEXERIL TAKE 1 TABLET TWICE A DAY AS NEEDED FOR MUSCLE SPASMS (SEDATION CAUTION)   FISH OIL PO Take 1,000 mg by mouth once a week.   Iron (Ferrous Sulfate) 325 (65 Fe) MG Tabs Take 325 mg by mouth every other day.   latanoprost 0.005 % ophthalmic solution Commonly known as: XALATAN Place 1 drop into both eyes at bedtime.   lisinopril 40 MG tablet Commonly known as: ZESTRIL Take 1 tablet (40 mg total) by mouth daily.   NovoLIN N FlexPen 100 UNIT/ML Kiwkpen Generic drug: Insulin NPH (Human) (Isophane) Inject 160 Units into the skin every morning. and pen needles 3/day. What changed: how much to take   NovoLIN R FlexPen ReliOn 100 UNIT/ML KwikPen Generic drug: Insulin Regular Human Inject 25 Units as directed daily with supper. What changed: how much to take   OneTouch Ultra test strip Generic drug: glucose blood USE AS INSTRUCTED TO TEST BLOOD SUGARS 3 OR 4 TIMES DAILY   rosuvastatin 20 MG tablet Commonly known as: CRESTOR Take 1 tablet (20 mg total) by mouth daily.   Sure Comfort Pen Needles 32G X 4 MM Misc Generic drug: Insulin Pen Needle USE THREE TIMES A DAY   traMADol 50 MG tablet Commonly known as: ULTRAM TAKE 1 TABLET THREE TIMES A DAY AS NEEDED FOR MODERATE PAIN   Trulicity 1.5 YQ/6.5HQ Sopn Generic drug: Dulaglutide Inject weekly   Vitamin D3 125 MCG (5000 UT) Caps Take 5,000 Units by mouth 2 (two) times a week.        Allergies:  No Known Allergies   Past Medical History:  Diagnosis Date   Arthritis    Coronary atherosclerosis    a. 07/2018 CT Chest: 3 vessel coronary atherosclerosis; b. 07/2018 MV: EF 46% (GI uptake artifact), No ischemia/infarct.  Mild to mod diffuse Ao atherosclerosis and at least moderate 2 vessel CAD (LAD/RCA) on CT imaging. Low risk.   Depression    improved on sertraline   Diabetes mellitus, type 2 ([place]) 09/1996   GERD (gastroesophageal reflux disease) 1990   History of peptic ulcer disease    Hyperlipidemia 1992   Hypertension 05/2003   Lumbar stenosis L2-3   Muscle atrophy    in arms from degenerative changes in neck   Neck  pain    chronic   Smoker    Wears glasses     Past Surgical History:  Procedure Laterality Date   ABDOMINAL AORTOGRAM W/LOWER EXTREMITY N/A 09/03/2019   Procedure: ABDOMINAL AORTOGRAM W/LOWER EXTREMITY;  Surgeon: Wellington Hampshire, MD;  Location: [facility];  Service: Cardiovascular;  Laterality: N/A;  Limited Study   CERVICAL DISC SURGERY  04/19/2002   fusion, Dr. Lorin Mercy   CERVICAL DISCECTOMY  01/31/1995   partial   CERVICAL DISCECTOMY  01/30/1998   COLONOSCOPY WITH ESOPHAGOGASTRODUODENOSCOPY (EGD)  06/25/2015   ENTEROSCOPY N/A 07/04/2021   Procedure: ENTEROSCOPY;  Surgeon: Doran Stabler, MD;  [facility];  Service: Gastroenterology;  Laterality: N/A;   HEMORRHOID SURGERY  01/31/1987   HOT HEMOSTASIS N/A 07/04/2021   Procedure: HOT HEMOSTASIS (ARGON PLASMA COAGULATION/BICAP);  Surgeon: Doran Stabler, MD;  Location: [facility];  Service: Gastroenterology;  Laterality: N/A;   KNEE ARTHROPLASTY  11/20/2011   Procedure: COMPUTER ASSISTED TOTAL KNEE ARTHROPLASTY;  Surgeon: Marybelle Killings, MD;  Location: [facility];  Service: Orthopedics;  Laterality: Left;  Conversion Left Knee Medial Uni to Total Knee Arthroplasty-Cemented   LUMBAR LAMINECTOMY/DECOMPRESSION MICRODISCECTOMY N/A 04/28/2015   Procedure: L2-3 Decompression;  Surgeon: Marybelle Killings, MD;  Location: [facility];  Service: Orthopedics;  Laterality: N/A;   MR MRA DUPLICATE EXAM  INACTIVATE     MULTIPLE TOOTH EXTRACTIONS     Neisen funduplication  50/53/9767   PERIPHERAL VASCULAR INTERVENTION  09/03/2019    Procedure: PERIPHERAL VASCULAR INTERVENTION;  Surgeon: Wellington Hampshire, MD;  Location: [facility];  Service: Cardiovascular;;  Iliac Stent   REPLACEMENT TOTAL KNEE Left 02/14/02, 05/04/02   left- partial   SCC EXCISION  04/25/2001   Dr. March Rummage   STOMACH SURGERY  01/30/1982   P[facility], [facility]   SUBMUCOSAL TATTOO INJECTION  07/04/2021   Procedure: SUBMUCOSAL TATTOO INJECTION;  Surgeon: Doran Stabler, MD;  [facility]: [facility];  Service: Gastroenterology;;    Family History  Problem Relation Age of Onset   Diabetes Mother    Hypertension Mother    Stroke Mother    Diabetes Father    Diabetes Sister    Diabetes Brother    Diabetes Brother    Heart disease Brother    Breast cancer Maternal Grandmother    Lung disease Paternal Grandfather        black lung   Heart disease Son    Arthritis Neg Hx    Prostate cancer Neg Hx    Colon cancer Neg Hx    Stomach cancer Neg Hx     Social History:  reports that he quit smoking about 9 years ago. His smoking use included cigarettes. He has a 41.00 pack-year smoking history. He has never used smokeless tobacco. He reports that he does not drink alcohol and does not use drugs.  Review of Systems:  Last diabetic eye exam date 7/22  Last foot exam date: 10/22  Symptoms of neuropathy: None Foot exam does not show significant deformities or any sensory loss but he has decreased pulses which may qualify him for Medicare approved diabetic shoes  Hypertension:   Treatment includes Lisinopril 40 mg daily  Home BP wrist instrument readings and appear to be in the normal range by history No recent lightheadedness  BP Readings from Last 3 Encounters:  10/12/21 110/60  08/22/21 122/64  07/13/21 (!) 100/50    Lipid management: Treated with Crestor and Zetia by his cardiologist  Lab Results  Component Value Date   CHOL 82 10/12/2021   CHOL 108 09/28/2020   CHOL 135 12/05/2019   Lab Results  Component Value Date   HDL 30 (L)  10/12/2021   HDL 30 (L) 09/28/2020   HDL 34.10 (L) 12/05/2019   Lab Results  Component Value Date   LDLCALC 14 10/12/2021   LDLCALC 45 09/28/2020   LDLCALC 68 07/13/2014   Lab Results  Component Value Date   TRIG 189 (H) 10/12/2021   TRIG 202 (H) 09/28/2020   TRIG 204.0 (H) 12/05/2019   Lab Results  Component Value Date   CHOLHDL 2.7 10/12/2021   CHOLHDL 3.6 09/28/2020   CHOLHDL 4 12/05/2019   Lab Results  Component Value Date   LDLDIRECT 78.0 12/05/2019   LDLDIRECT 71.0 12/04/2018   LDLDIRECT 79.0 11/26/2017     Examination:   Ht '5\' 10"'$  (1.778 m)   Wt 199 lb (90.3 kg)   BMI 28.55 kg/m   Body mass index is 28.55 kg/m.   Diabetic Foot Exam - Simple   Simple Foot Form Diabetic Foot exam was performed with the following findings: Yes   Visual Inspection No deformities, no ulcerations, no other skin breakdown bilaterally: Yes Sensation Testing Intact to touch and monofilament testing bilaterally: Yes Pulse Check See comments: Yes Comments Absent pedal pulses      ASSESSMENT/ PLAN:    Diabetes type 2 on insulin:   Current regimen: NPH and regular insulin, Trulicity 2.29 mg weekly  See history of present illness for detailed discussion of current diabetes management, blood sugar patterns and problems identified  A1c is 7% but fructosamine is high at 335  Blood glucose which was better with Trulicity is now significantly worse with extreme variability This is likely to be from using NPH and regular insulin especially high dose morning NPH Time in range is only 42% on his sensor and has hyperglycemia erratically at all different times  Recommendations:  Switch NPH to Toujeo max starting with 100 units daily and titrating by 10 units every 3 days to get morning sugars consistently below 130 Prescription sent to mail order company and he will let us know if there is any issues with coverage Start regular insulin 15 units at lunch and continue 20 before  dinner Continue Trulicity unchanged but consider increasing the dose  He will also attend diabetes counseling session with the CDE for follow-up of insulin adjustment and also needs review of meal planning  Urine microalbumin normal Renal function also normal  Foot exam does not show significant deformities or any sensory loss but he has decreased pulses which may qualify him for Medicare approved diabetic shoes  Total visit time including counseling time apparently 50% = 30 minutes  There are no Patient Instructions on file for this visit.   Elayne Snare 11/03/2021, 1:25 PM

## 2021-11-03 NOTE — Patient Instructions (Signed)
Stop N insulin Start Toujeo 100 1x daily in am  Take 15 R before lunch also

## 2021-11-06 DIAGNOSIS — E119 Type 2 diabetes mellitus without complications: Secondary | ICD-10-CM | POA: Diagnosis not present

## 2021-11-06 DIAGNOSIS — Z794 Long term (current) use of insulin: Secondary | ICD-10-CM | POA: Diagnosis not present

## 2021-11-07 ENCOUNTER — Telehealth (HOSPITAL_COMMUNITY): Payer: Self-pay

## 2021-11-07 ENCOUNTER — Other Ambulatory Visit (HOSPITAL_COMMUNITY): Payer: Self-pay

## 2021-11-07 NOTE — Telephone Encounter (Signed)
Received notification from [facility] regarding a prior authorization for [employer].   Authorization has been submitted via fax and is pending. .     Case ID: [id number] Phone number: [phone] Fax number: [phone]

## 2021-11-09 ENCOUNTER — Encounter: Payer: Self-pay | Admitting: Endocrinology

## 2021-11-16 ENCOUNTER — Other Ambulatory Visit: Payer: Self-pay | Admitting: *Deleted

## 2021-11-16 MED ORDER — CLOPIDOGREL BISULFATE 75 MG PO TABS: 75.0000 mg | ORAL_TABLET | Freq: Every day | ORAL | 3 refills | Status: DC

## 2021-11-16 MED ORDER — LISINOPRIL 40 MG PO TABS: 40.0000 mg | ORAL_TABLET | Freq: Every day | ORAL | 3 refills | Status: DC

## 2021-11-18 ENCOUNTER — Encounter: Payer: Medicare HMO | Attending: Family Medicine | Admitting: Registered"

## 2021-11-18 ENCOUNTER — Encounter: Payer: Self-pay | Admitting: Registered"

## 2021-11-18 DIAGNOSIS — Z713 Dietary counseling and surveillance: Secondary | ICD-10-CM | POA: Insufficient documentation

## 2021-11-18 DIAGNOSIS — E11649 Type 2 diabetes mellitus with hypoglycemia without coma: Secondary | ICD-10-CM | POA: Diagnosis not present

## 2021-11-18 DIAGNOSIS — Z794 Long term (current) use of insulin: Secondary | ICD-10-CM | POA: Insufficient documentation

## 2021-11-18 DIAGNOSIS — E119 Type 2 diabetes mellitus without complications: Secondary | ICD-10-CM

## 2021-11-18 NOTE — Progress Notes (Signed)
Diabetes Self-Management Education  Visit Type: First/Initial  Appt. Start Time: 0855 Appt. End Time: 2637  11/18/2021  [name] Cluster, identified by name and date of birth, is a 71 y.o. male with a diagnosis of Diabetes: Type 2.   A1c 7% Medication: Toujeo x1 week, 100 u after checking FBS, has not had to titrate yet. Regular insulin 20 u before dinner (takes right when he starts eating)  ASSESSMENT  There were no vitals taken for this visit. There is no height or weight on file to calculate BMI.  Per pt lot, has not had FBS >130 more than 2 days in a row.  Today FBS 154 mg/dL, Pt scanned in visit and was 181 mg/dL hasn't eaten yet today, but drank diet dr pepper to take medication. Pt may have blood sugar increase in response to caffeine.   Pt states he is following his wife's diet and eating smaller portions in an effort to lose weight. Pt states in 2 months he has lost about 9 lbs.  Pt reports a few hypoglycemic events. Pt did not remember events, meals, etc prior to low blood sugar. Ask pt to records details if happens again.  Pt states he drinks diet soda with 1 or 2 meals per day and takes morning medication with diet soda.  Pt states he has cut his orange juice intake, now has 1 c in the morning.  Pt states he spends a lot of time outside, has chickens and rabbits, goes fishing with brother-in-law. Pt states on rainy or cold days has indoor hobby of [place] may stay up until 2 am.   Diabetes Self-Management Education - 11/18/21 0900       Visit Information   Visit Type First/Initial      Initial Visit   Diabetes Type Type 2    Are you currently following a meal plan? Yes   following wife's plan for weight loss   Are you taking your medications as prescribed? Yes      Complications   Last HgB A1C per patient/outside source 7 %    How often do you check your blood sugar? 3-4 times/day   using CGM   Fasting Blood glucose range (mg/dL) 130-179;70-129     Postprandial Blood glucose range (mg/dL) 130-179;180-200    Number of hypoglycemic episodes per month 4      Dietary Intake   Breakfast diet soda to take pills later eats 3 bacon, 2 eggs, 1 piece toast, black coffee, ~8 oz orange juice    Snack (morning) nutrition bar    Lunch sandwich, ham and cheese, sometimes a few chips, maybe coffee    Snack (afternoon) none    Dinner chicken, peas and carrots, 1 c mac & cheese, diet mt dew    Snack (evening) none    Beverage(s) diet soda, orange juice, 4 bottles water      Activity / Exercise   Activity / Exercise Type ADL's   390 ft to mailbox, outside with farm animals     Patient Education   Previous Diabetes Education No    Healthy Eating Reviewed blood glucose goals for pre and post meals and how to evaluate the patients' food intake on their blood glucose level.    Medications Reviewed patients medication for diabetes, action, purpose, timing of dose and side effects.    Monitoring Taught/evaluated CGM (comment);Identified appropriate SMBG and/or A1C goals.;Purpose and frequency of SMBG.      Individualized Goals (developed by patient)  Nutrition Other (comment)   timing of meals   Medications take my medication as prescribed    Monitoring  Test blood glucose pre and post meals as discussed      Outcomes   Expected Outcomes Demonstrated interest in learning. Expect positive outcomes    Future DMSE 2 months    Program Status Not Completed            Individualized Plan for Diabetes Self-Management Training:   Learning Objective:  Patient will have a greater understanding of diabetes self-management. Patient education plan is to attend individual and/or group sessions per assessed needs and concerns.  Patient Instructions  To help bring down your fasting blood sugar: Aim to eat dinner earlier (6 - 6:30)  Take Regular insulin 15-30 min before eating No caffeine at night  Check blood sugar at bedtime (80-130) No sugar during  the night   Treat hypoglycemia rule of 15... keep a log of how long ago and what you ate before the hypoglycemia  Expected Outcomes:  Demonstrated interest in learning. Expect positive outcomes  Education material provided: A1C conversion sheet  If problems or questions, patient to contact team via:  Phone  Future DSME appointment: 2 months

## 2021-11-18 NOTE — Patient Instructions (Signed)
To help bring down your fasting blood sugar: Aim to eat dinner earlier (6 - 6:30)  Take Regular insulin 15-30 min before eating No caffeine at night  Check blood sugar at bedtime (80-130) No sugar during the night   Treat hypoglycemia rule of 15... keep a log of how long ago and what you ate before the hypoglycemia

## 2021-12-06 DIAGNOSIS — E119 Type 2 diabetes mellitus without complications: Secondary | ICD-10-CM | POA: Diagnosis not present

## 2021-12-06 DIAGNOSIS — Z794 Long term (current) use of insulin: Secondary | ICD-10-CM | POA: Diagnosis not present

## 2021-12-14 ENCOUNTER — Ambulatory Visit (INDEPENDENT_AMBULATORY_CARE_PROVIDER_SITE_OTHER): Payer: Medicare HMO | Admitting: Podiatry

## 2021-12-14 ENCOUNTER — Encounter: Payer: Self-pay | Admitting: Podiatry

## 2021-12-14 DIAGNOSIS — M79676 Pain in unspecified toe(s): Secondary | ICD-10-CM

## 2021-12-14 DIAGNOSIS — E119 Type 2 diabetes mellitus without complications: Secondary | ICD-10-CM | POA: Diagnosis not present

## 2021-12-14 DIAGNOSIS — B351 Tinea unguium: Secondary | ICD-10-CM

## 2021-12-14 DIAGNOSIS — M2010 Hallux valgus (acquired), unspecified foot: Secondary | ICD-10-CM

## 2021-12-14 NOTE — Addendum Note (Signed)
Addended by: Gardiner Barefoot on: 12/14/2021 03:11 PM   Modules accepted: Orders

## 2021-12-14 NOTE — Progress Notes (Signed)
This patient returns to my office for at risk foot care.  This patient requires this care by a professional since this patient will be at risk due to having type 2 diabetes.  This patient is unable to cut nails himself since the patient cannot reach his nails.These nails are painful walking and wearing shoes.  This patient presents for at risk foot care today.  General Appearance  Alert, conversant and in no acute stress.  Vascular  Dorsalis pedis and posterior tibial  pulses are palpable  bilaterally.  Capillary return is within normal limits  bilaterally. Temperature is within normal limits  bilaterally.  Neurologic  Senn-Weinstein monofilament wire test diminished  bilaterally. Muscle power within normal limits bilaterally.  Nails Thick disfigured discolored nails with subungual debris  from hallux to fifth toes bilaterally. No evidence of bacterial infection or drainage bilaterally.  Orthopedic  No limitations of motion  feet .  No crepitus or effusions noted.  No bony pathology or digital deformities noted.  HAV  B/L.  Skin  normotropic skin with no porokeratosis noted bilaterally.  No signs of infections or ulcers noted.     Onychomycosis  Pain in right toes  Pain in left toes  Consent was obtained for treatment procedures.   Mechanical debridement of nails 1-5  bilaterally performed with a nail nipper.  Filed with dremel without incident.    Return office visit    12  weeks                  Told patient to return for periodic foot care and evaluation due to potential at risk complications.   Gardiner Barefoot DPM

## 2021-12-15 ENCOUNTER — Other Ambulatory Visit: Payer: Self-pay | Admitting: Family Medicine

## 2021-12-15 NOTE — Telephone Encounter (Signed)
Refill request for  TRAMADOL HCL TABS '50MG'$    LOV - 07/11/21 Next OV - not scheduled Last refill - 09/20/21 #135/1

## 2021-12-20 ENCOUNTER — Telehealth: Payer: Self-pay

## 2021-12-20 NOTE — Telephone Encounter (Signed)
Unsuccessful attempt to reach patient on preferred number listed in notes for scheduled AWV. No answer. Unable to leave a message

## 2021-12-28 ENCOUNTER — Other Ambulatory Visit: Payer: Self-pay

## 2021-12-28 DIAGNOSIS — E11649 Type 2 diabetes mellitus with hypoglycemia without coma: Secondary | ICD-10-CM

## 2021-12-28 MED ORDER — SURE COMFORT PEN NEEDLES 32G X 4 MM MISC: 10 refills | Status: DC

## 2021-12-29 ENCOUNTER — Other Ambulatory Visit (HOSPITAL_COMMUNITY): Payer: Self-pay

## 2021-12-30 ENCOUNTER — Ambulatory Visit (INDEPENDENT_AMBULATORY_CARE_PROVIDER_SITE_OTHER): Payer: Medicare HMO

## 2021-12-30 VITALS — Ht 70.0 in | Wt 190.8 lb

## 2021-12-30 DIAGNOSIS — Z0000 Encounter for general adult medical examination without abnormal findings: Secondary | ICD-10-CM

## 2021-12-30 DIAGNOSIS — H401131 Primary open-angle glaucoma, bilateral, mild stage: Secondary | ICD-10-CM | POA: Diagnosis not present

## 2021-12-30 NOTE — Patient Instructions (Addendum)
[name] , Thank you for taking time to come for your Medicare Wellness Visit. I appreciate your ongoing commitment to your health goals. Please review the following plan we discussed and let me know if I can assist you in the future.   These are the goals we discussed:  Goals      Follow up with Primary Care Provider     Follow up as needed Weight about 185lb; portion control meals and walk     Increase physical activity     Starting 11/26/2017, I will continue to walk at least 30 min 2 days per week as tolerated.      Patient Stated     12/04/2018, I will maintain and continue medications as prescribed.      Patient Stated     12/05/2019, I will continue to walk 2 days a week for about 30 minutes.      Patient Stated     Would like to decrease A1C.         This is a list of the screening recommended for you and due dates:  Health Maintenance  Topic Date Due   Screening for Lung Cancer  01/17/2022   COVID-19 Vaccine (5 - 2023-24 season) 01/15/2022*   Zoster (Shingles) Vaccine (2 of 2) 01/22/2022   Hemoglobin A1C  02/22/2022   Eye exam for diabetics  08/24/2022   Yearly kidney function blood test for diabetes  11/01/2022   Yearly kidney health urinalysis for diabetes  11/01/2022   Complete foot exam   11/04/2022   Medicare Annual Wellness Visit  12/31/2022   Colon Cancer Screening  05/18/2026   DTaP/Tdap/Td vaccine (4 - Td or Tdap) 08/14/2030   Pneumonia Vaccine  Completed   Flu Shot  Completed   Hepatitis C Screening: USPSTF Recommendation to screen - Ages 55-79 yo.  Completed   HPV Vaccine  Aged Out  *Topic was postponed. The date shown is not the original due date.   Next appointment: Follow up in one year for your annual wellness visit.   Preventive Care 78 Years and Older, Male  Preventive care refers to lifestyle choices and visits with your health care provider that can promote health and wellness. What does preventive care include? A yearly physical exam.  This is also called an annual well check. Dental exams once or twice a year. Routine eye exams. Ask your health care provider how often you should have your eyes checked. Personal lifestyle choices, including: Daily care of your teeth and gums. Regular physical activity. Eating a healthy diet. Avoiding tobacco and drug use. Limiting alcohol use. Practicing safe sex. Taking low doses of aspirin every day. Taking vitamin and mineral supplements as recommended by your health care provider. What happens during an annual well check? The services and screenings done by your health care provider during your annual well check will depend on your age, overall health, lifestyle risk factors, and family history of disease. Counseling  Your health care provider may ask you questions about your: Alcohol use. Tobacco use. Drug use. Emotional well-being. Home and relationship well-being. Sexual activity. Eating habits. History of falls. Memory and ability to understand (cognition). Work and work Statistician. Screening  You may have the following tests or measurements: Height, weight, and BMI. Blood pressure. Lipid and cholesterol levels. These may be checked every 5 years, or more frequently if you are over 66 years old. Skin check. Lung cancer screening. You may have this screening every year starting at age  55 if you have a 30-pack-year history of smoking and currently smoke or have quit within the past 15 years. Fecal occult blood test (FOBT) of the stool. You may have this test every year starting at age 66. Flexible sigmoidoscopy or colonoscopy. You may have a sigmoidoscopy every 5 years or a colonoscopy every 10 years starting at age 4. Prostate cancer screening. Recommendations will vary depending on your family history and other risks. Hepatitis C blood test. Hepatitis B blood test. Sexually transmitted disease (STD) testing. Diabetes screening. This is done by checking your blood  sugar (glucose) after you have not eaten for a while (fasting). You may have this done every 1-3 years. Abdominal aortic aneurysm (AAA) screening. You may need this if you are a current or former smoker. Osteoporosis. You may be screened starting at age 22 if you are at high risk. Talk with your health care provider about your test results, treatment options, and if necessary, the need for more tests. Vaccines  Your health care provider may recommend certain vaccines, such as: Influenza vaccine. This is recommended every year. Tetanus, diphtheria, and acellular pertussis (Tdap, Td) vaccine. You may need a Td booster every 10 years. Zoster vaccine. You may need this after age 34. Pneumococcal 13-valent conjugate (PCV13) vaccine. One dose is recommended after age 44. Pneumococcal polysaccharide (PPSV23) vaccine. One dose is recommended after age 34. Talk to your health care provider about which screenings and vaccines you need and how often you need them. This information is not intended to replace advice given to you by your health care provider. Make sure you discuss any questions you have with your health care provider. Document Released: 02/12/2015 Document Revised: 10/06/2015 Document Reviewed: 11/17/2014 Elsevier Interactive Patient Education  2017 [place] Prevention in the Home Falls can cause injuries. They can happen to people of all ages. There are many things you can do to make your home safe and to help prevent falls. What can I do on the outside of my home? Regularly fix the edges of walkways and driveways and fix any cracks. Remove anything that might make you trip as you walk through a door, such as a raised step or threshold. Trim any bushes or trees on the path to your home. Use bright outdoor lighting. Clear any walking paths of anything that might make someone trip, such as rocks or tools. Regularly check to see if handrails are loose or broken. Make sure that  both sides of any steps have handrails. Any raised decks and porches should have guardrails on the edges. Have any leaves, snow, or ice cleared regularly. Use sand or salt on walking paths during winter. Clean up any spills in your garage right away. This includes oil or grease spills. What can I do in the bathroom? Use night lights. Install grab bars by the toilet and in the tub and shower. Do not use towel bars as grab bars. Use non-skid mats or decals in the tub or shower. If you need to sit down in the shower, use a plastic, non-slip stool. Keep the floor dry. Clean up any water that spills on the floor as soon as it happens. Remove soap buildup in the tub or shower regularly. Attach bath mats securely with double-sided non-slip rug tape. Do not have throw rugs and other things on the floor that can make you trip. What can I do in the bedroom? Use night lights. Make sure that you have a light by your bed  that is easy to reach. Do not use any sheets or blankets that are too big for your bed. They should not hang down onto the floor. Have a firm chair that has side arms. You can use this for support while you get dressed. Do not have throw rugs and other things on the floor that can make you trip. What can I do in the kitchen? Clean up any spills right away. Avoid walking on wet floors. Keep items that you use a lot in easy-to-reach places. If you need to reach something above you, use a strong step stool that has a grab bar. Keep electrical cords out of the way. Do not use floor polish or wax that makes floors slippery. If you must use wax, use non-skid floor wax. Do not have throw rugs and other things on the floor that can make you trip. What can I do with my stairs? Do not leave any items on the stairs. Make sure that there are handrails on both sides of the stairs and use them. Fix handrails that are broken or loose. Make sure that handrails are as long as the stairways. Check  any carpeting to make sure that it is firmly attached to the stairs. Fix any carpet that is loose or worn. Avoid having throw rugs at the top or bottom of the stairs. If you do have throw rugs, attach them to the floor with carpet tape. Make sure that you have a light switch at the top of the stairs and the bottom of the stairs. If you do not have them, ask someone to add them for you. What else can I do to help prevent falls? Wear shoes that: Do not have high heels. Have rubber bottoms. Are comfortable and fit you well. Are closed at the toe. Do not wear sandals. If you use a stepladder: Make sure that it is fully opened. Do not climb a closed stepladder. Make sure that both sides of the stepladder are locked into place. Ask someone to hold it for you, if possible. Clearly mark and make sure that you can see: Any grab bars or handrails. First and last steps. Where the edge of each step is. Use tools that help you move around (mobility aids) if they are needed. These include: Canes. Walkers. Scooters. Crutches. Turn on the lights when you go into a dark area. Replace any light bulbs as soon as they burn out. Set up your furniture so you have a clear path. Avoid moving your furniture around. If any of your floors are uneven, fix them. If there are any pets around you, be aware of where they are. Review your medicines with your doctor. Some medicines can make you feel dizzy. This can increase your chance of falling. Ask your doctor what other things that you can do to help prevent falls. This information is not intended to replace advice given to you by your health care provider. Make sure you discuss any questions you have with your health care provider. Document Released: 11/12/2008 Document Revised: 06/24/2015 Document Reviewed: 02/20/2014 Elsevier Interactive Patient Education  2017 [place]. Opioid Pain Medicine Management Opioids are powerful medicines that are used to treat  moderate to severe pain. When used for short periods of time, they can help you to: Sleep better. Do better in physical or occupational therapy. Feel better in the first few days after an injury. Recover from surgery. Opioids should be taken with the supervision of a trained health care provider.  They should be taken for the shortest period of time possible. This is because opioids can be addictive, and the longer you take opioids, the greater your risk of addiction. This addiction can also be called opioid use disorder. What are the risks? Using opioid pain medicines for longer than 3 days increases your risk of side effects. Side effects include: Constipation. Nausea and vomiting. Breathing difficulties (respiratory depression). Drowsiness. Confusion. Opioid use disorder. Itching. Taking opioid pain medicine for a long period of time can affect your ability to do daily tasks. It also puts you at risk for: Motor vehicle crashes. Depression. Suicide. Heart attack. Overdose, which can be life-threatening. What is a pain treatment plan? A pain treatment plan is an agreement between you and your health care provider. Pain is unique to each person, and treatments vary depending on your condition. To manage your pain, you and your health care provider need to work together. To help you do this: Discuss the goals of your treatment, including how much pain you might expect to have and how you will manage the pain. Review the risks and benefits of taking opioid medicines. Remember that a good treatment plan uses more than one approach and minimizes the chance of side effects. Be honest about the amount of medicines you take and about any drug or alcohol use. Get pain medicine prescriptions from only one health care provider. Pain can be managed with many types of alternative treatments. Ask your health care provider to refer you to one or more specialists who can help you manage pain  through: Physical or occupational therapy. Counseling (cognitive behavioral therapy). Good nutrition. Biofeedback. Massage. Meditation. Non-opioid medicine. Following a gentle exercise program. How to use opioid pain medicine Taking medicine Take your pain medicine exactly as told by your health care provider. Take it only when you need it. If your pain gets less severe, you may take less than your prescribed dose if your health care provider approves. If you are not having pain, do nottake pain medicine unless your health care provider tells you to take it. If your pain is severe, do nottry to treat it yourself by taking more pills than instructed on your prescription. Contact your health care provider for help. Write down the times when you take your pain medicine. It is easy to become confused while on pain medicine. Writing the time can help you avoid overdose. Take other over-the-counter or prescription medicines only as told by your health care provider. Keeping yourself and others safe  While you are taking opioid pain medicine: Do not drive, use machinery, or power tools. Do not sign legal documents. Do not drink alcohol. Do not take sleeping pills. Do not supervise children by yourself. Do not do activities that require climbing or being in high places. Do not go to a lake, river, ocean, spa, or swimming pool. Do not share your pain medicine with anyone. Keep pain medicine in a locked cabinet or in a secure area where pets and children cannot reach it. Stopping your use of opioids If you have been taking opioid medicine for more than a few weeks, you may need to slowly decrease (taper) how much you take until you stop completely. Tapering your use of opioids can decrease your risk of symptoms of withdrawal, such as: Pain and cramping in the abdomen. Nausea. Sweating. Sleepiness. Restlessness. Uncontrollable shaking (tremors). Cravings for the medicine. Do not attempt to  taper your use of opioids on your own. Talk with your health  care provider about how to do this. Your health care provider may prescribe a step-down schedule based on how much medicine you are taking and how long you have been taking it. Getting rid of leftover pills Do not save any leftover pills. Get rid of leftover pills safely by: Taking the medicine to a prescription take-back program. This is usually offered by the county or law enforcement. Bringing them to a pharmacy that has a drug disposal container. Flushing them down the toilet. Check the label or package insert of your medicine to see whether this is safe to do. Throwing them out in the trash. Check the label or package insert of your medicine to see whether this is safe to do. If it is safe to throw it out, remove the medicine from the original container, put it into a sealable bag or container, and mix it with used coffee grounds, food scraps, dirt, or cat litter before putting it in the trash. Follow these instructions at home: Activity Do exercises as told by your health care provider. Avoid activities that make your pain worse. Return to your normal activities as told by your health care provider. Ask your health care provider what activities are safe for you. General instructions You may need to take these actions to prevent or treat constipation: Drink enough fluid to keep your urine pale yellow. Take over-the-counter or prescription medicines. Eat foods that are high in fiber, such as beans, whole grains, and fresh fruits and vegetables. Limit foods that are high in fat and processed sugars, such as fried or sweet foods. Keep all follow-up visits. This is important. Where to find support If you have been taking opioids for a long time, you may benefit from receiving support for quitting from a local support group or counselor. Ask your health care provider for a referral to these resources in your area. Where to find more  information Centers for Disease Control and Prevention (CDC): http://www.wolf.info/ [place]. Food and Drug Administration (FDA): [place] Get help right away if: You may have taken too much of an opioid (overdosed). Common symptoms of an overdose: Your breathing is slower or more shallow than normal. You have a very slow heartbeat (pulse). You have slurred speech. You have nausea and vomiting. Your pupils become very small. You have other potential symptoms: You are very confused. You faint or feel like you will faint. You have cold, clammy skin. You have blue lips or fingernails. You have thoughts of harming yourself or harming others. These symptoms may represent a serious problem that is an emergency. Do not wait to see if the symptoms will go away. Get medical help right away. Call your local emergency services (911 in the [place] Do not drive yourself to the hospital.  If you ever feel like you may hurt yourself or others, or have thoughts about taking your own life, get help right away. Go to your nearest emergency department or: Call your local emergency services (911 in the [place] Call the [facility] 848-751-6099 in the [place] Call a suicide crisis helpline, such as the [place] at 650-411-3702 or 988 in the [place]. This is open 24 hours a day in the [place] Text the Crisis Text Line at 630-173-0653 (in the [place] Summary Opioid medicines can help you manage moderate to severe pain for a short period of time. A pain treatment plan is an agreement between you and your health care provider. Discuss the goals of your treatment, including  how much pain you might expect to have and how you will manage the pain. If you think that you or someone else may have taken too much of an opioid, get medical help right away. This information is not intended to replace advice given to you by your health care provider. Make sure you discuss any questions you have with  your health care provider. Document Revised: 08/11/2020 Document Reviewed: 04/28/2020 Elsevier Patient Education  [place].

## 2021-12-30 NOTE — Progress Notes (Signed)
Subjective:   Anthony Cuevas is a 71 y.o. male who presents for Medicare Annual/Subsequent preventive examination.  Review of Systems    No ROS.  Medicare Wellness Virtual Visit.  Visual/audio telehealth visit, UTA vital signs.   See social history for additional risk factors.   Cardiac Risk Factors include: advanced age (>24mn, >>16women);male gender;diabetes mellitus     Objective:    Today's Vitals   12/30/21 1239  Weight: 190 lb 12.8 oz (86.5 kg)  Height: '5\' 10"'$  (1.778 m)   Body mass index is 27.38 kg/m.     12/30/2021   12:47 PM 07/04/2021   10:03 AM 12/07/2020    2:48 PM 12/05/2019    2:50 PM 09/03/2019    6:44 AM 12/04/2018   12:01 PM 11/26/2017    9:13 AM  Advanced Directives  Does Patient Have a Medical Advance Directive? No No No No No No No  Would patient like information on creating a medical advance directive? No - Patient declined No - Patient declined Yes (MAU/Ambulatory/Procedural Areas - Information given) Yes (MAU/Ambulatory/Procedural Areas - Information given) No - Patient declined No - Patient declined No - Patient declined    Current Medications (verified) Outpatient Encounter Medications as of 12/30/2021  Medication Sig   ascorbic acid (VITAMIN C) 500 MG tablet Take 1 tablet (500 mg total) by mouth daily.   Cholecalciferol (VITAMIN D3) 125 MCG (5000 UT) CAPS Take 5,000 Units by mouth 2 (two) times a week.   clopidogrel (PLAVIX) 75 MG tablet Take 1 tablet (75 mg total) by mouth daily.   cyanocobalamin 1000 MCG tablet Take 1,000 mcg by mouth daily.   cyclobenzaprine (FLEXERIL) 10 MG tablet TAKE 1 TABLET TWICE A DAY AS NEEDED FOR MUSCLE SPASMS (SEDATION CAUTION)   Dulaglutide (TRULICITY) 1.5 MVO/1.6WVSOPN Inject weekly   insulin glargine, 2 Unit Dial, (TOUJEO MAX SOLOSTAR) 300 UNIT/ML Solostar Pen 100 units once daily and adjusting as directed to maximum dose of 140 units daily   Insulin Pen Needle (SURE COMFORT PEN NEEDLES) 32G X 4 MM MISC USE THREE  TIMES A DAY   Insulin Regular Human (NOVOLIN R FLEXPEN RELION) 100 UNIT/ML KwikPen Inject 25 Units as directed daily with supper. (Patient taking differently: Inject 20 Units as directed daily with supper.)   Iron, Ferrous Sulfate, 325 (65 Fe) MG TABS Take 325 mg by mouth every other day.   latanoprost (XALATAN) 0.005 % ophthalmic solution Place 1 drop into both eyes at bedtime.   lisinopril (ZESTRIL) 40 MG tablet Take 1 tablet (40 mg total) by mouth daily.   Omega-3 Fatty Acids (FISH OIL PO) Take 1,000 mg by mouth once a week.   ONETOUCH ULTRA test strip USE AS INSTRUCTED TO TEST BLOOD SUGARS 3 OR 4 TIMES DAILY   rosuvastatin (CRESTOR) 20 MG tablet Take 1 tablet (20 mg total) by mouth daily.   traMADol (ULTRAM) 50 MG tablet TAKE 1 TABLET THREE TIMES A DAY AS NEEDED FOR MODERATE PAIN   No facility-administered encounter medications on file as of 12/30/2021.    Allergies (verified) Patient has no known allergies.   History: Past Medical History:  Diagnosis Date   Arthritis    Coronary atherosclerosis    a. 07/2018 CT Chest: 3 vessel coronary atherosclerosis; b. 07/2018 MV: EF 46% (GI uptake artifact), No ischemia/infarct. Mild to mod diffuse Ao atherosclerosis and at least moderate 2 vessel CAD (LAD/RCA) on CT imaging. Low risk.   Depression    improved on sertraline  Diabetes mellitus, type 2 ([place]) 09/1996   GERD (gastroesophageal reflux disease) 1990   History of peptic ulcer disease    Hyperlipidemia 1992   Hypertension 05/2003   Lumbar stenosis L2-3   Muscle atrophy    in arms from degenerative changes in neck   Neck pain    chronic   Smoker    Wears glasses    Past Surgical History:  Procedure Laterality Date   ABDOMINAL AORTOGRAM W/LOWER EXTREMITY N/A 09/03/2019   Procedure: ABDOMINAL AORTOGRAM W/LOWER EXTREMITY;  Surgeon: Wellington Hampshire, MD;  Location: [facility];  Service: Cardiovascular;  Laterality: N/A;  Limited Study   CERVICAL DISC SURGERY  04/19/2002    fusion, Dr. Lorin Mercy   CERVICAL DISCECTOMY  01/31/1995   partial   CERVICAL DISCECTOMY  01/30/1998   COLONOSCOPY WITH ESOPHAGOGASTRODUODENOSCOPY (EGD)  06/25/2015   ENTEROSCOPY N/A 07/04/2021   Procedure: ENTEROSCOPY;  Surgeon: Doran Stabler, MD;  Location: [facility];  Service: Gastroenterology;  Laterality: N/A;   HEMORRHOID SURGERY  01/31/1987   HOT HEMOSTASIS N/A 07/04/2021   Procedure: HOT HEMOSTASIS (ARGON PLASMA COAGULATION/BICAP);  Surgeon: Doran Stabler, MD;  Location: [facility];  Service: Gastroenterology;  Laterality: N/A;   KNEE ARTHROPLASTY  11/20/2011   Procedure: COMPUTER ASSISTED TOTAL KNEE ARTHROPLASTY;  Surgeon: Marybelle Killings, MD;  Location: [facility];  Service: Orthopedics;  Laterality: Left;  Conversion Left Knee Medial Uni to Total Knee Arthroplasty-Cemented   LUMBAR LAMINECTOMY/DECOMPRESSION MICRODISCECTOMY N/A 04/28/2015   Procedure: L2-3 Decompression;  Surgeon: Marybelle Killings, MD;  Location: [facility];  Service: Orthopedics;  Laterality: N/A;   MR MRA DUPLICATE EXAM  INACTIVATE     MULTIPLE TOOTH EXTRACTIONS     Neisen funduplication  40/98/1191   PERIPHERAL VASCULAR INTERVENTION  09/03/2019   Procedure: PERIPHERAL VASCULAR INTERVENTION;  Surgeon: Wellington Hampshire, MD;  Location: [facility];  Service: Cardiovascular;;  Iliac Stent   REPLACEMENT TOTAL KNEE Left 02/14/02, 05/04/02   left- partial   SCC EXCISION  04/25/2001   Dr. March Rummage   STOMACH SURGERY  01/30/1982   PUD, [place] INJECTION  07/04/2021   Procedure: SUBMUCOSAL TATTOO INJECTION;  Surgeon: Doran Stabler, MD;  Location: [facility];  Service: Gastroenterology;;   Family History  Problem Relation Age of Onset   Diabetes Mother    Hypertension Mother    Stroke Mother    Diabetes Father    Diabetes Sister    Diabetes Brother    Diabetes Brother    Heart disease Brother    Breast cancer Maternal Grandmother    Lung disease Paternal Grandfather        black lung   Heart  disease Son    Arthritis Neg Hx    Prostate cancer Neg Hx    Colon cancer Neg Hx    Stomach cancer Neg Hx    Social History   Socioeconomic History   Marital status: Married    Spouse name: Not on file   Number of children: 3   Years of education: Not on file   Highest education level: Not on file  Occupational History   Occupation: Training and development officer, retired    Comment: Scientific laboratory technician  Tobacco Use   Smoking status: Former    Packs/day: 1.00    Years: 41.00    Total pack years: 41.00    Types: Cigarettes    Quit date: 2014    Years since quitting: 9.9  Smokeless tobacco: Never  Vaping Use   Vaping Use: Never used  Substance and Sexual Activity   Alcohol use: No    Alcohol/week: 0.0 standard drinks of alcohol   Drug use: Never   Sexual activity: Not Currently  Other Topics Concern   Not on file  Social History Narrative   Retired Education officer, museum, [employer], aviation fuel, [phone], no known agent orange exposure but 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   Married 1975   3 kids   Social Determinants of Health   Financial Resource Strain: Low Risk  (12/30/2021)   Overall Financial Resource Strain (CARDIA)    Difficulty of Paying Living Expenses: Not very hard  Food Insecurity: No Food Insecurity (12/30/2021)   Hunger Vital Sign    Worried About Running Out of Food in the Last Year: Never true    [place] in the Last Year: Never true  Transportation Needs: No Transportation Needs (12/30/2021)   PRAPARE - Hydrologist (Medical): No    Lack of Transportation (Non-Medical): No  Physical Activity: Insufficiently Active (12/30/2021)   Exercise Vital Sign    Days of Exercise per Week: 2 days    Minutes of Exercise per Session: 20 min  Stress: No Stress Concern Present (12/30/2021)   [place]    Feeling of Stress : Not at all  Social Connections: Unknown (12/30/2021)   Social  Connection and Isolation Panel [NHANES]    Frequency of Communication with Friends and Family: Once a week    Frequency of Social Gatherings with Friends and Family: Twice a week    Attends Religious Services: Not on Advertising copywriter or Organizations: No    Attends Archivist Meetings: Never    Marital Status: Married    Tobacco Counseling Counseling given: Not Answered   Clinical Intake:  Pre-visit preparation completed: Yes        Diabetes: Yes (Followed by pcp)  How often do you need to have someone help you when you read instructions, pamphlets, or other written materials from your doctor or pharmacy?: 1 - Never  Nutrition Risk Assessment: Has the patient had any N/V/D within the last 2 months?  No  Does the patient have any non-healing wounds?  No  Has the patient had any unintentional weight loss or weight gain?  No   Diabetes: Is the patient diabetic?  Yes  If diabetic, was a CBG obtained today?  No  Did the patient bring in their glucometer from home?  No   Financial Strains and Diabetes Management: Are you having any financial strains with the device, your supplies or your medication? No .  Does the patient want to be seen by Chronic Care Management for management of their diabetes?  No  Would the patient like to be referred to a Nutritionist or for Diabetic Management?  No     Interpreter Needed?: No      Activities of Daily Living    12/30/2021   12:47 PM 12/26/2021   11:33 AM  In your present state of health, do you have any difficulty performing the following activities:  Hearing? 0 0  Vision? 0 0  Difficulty concentrating or making decisions? 0 0  Walking or climbing stairs? 0 0  Dressing or bathing? 0 0  Doing errands, shopping? 0 0  Preparing Food and eating ? N N  Using  the Toilet? N N  In the past six months, have you accidently leaked urine? N N  Do you have problems with loss of bowel control? N N  Managing your  Medications? N N  Managing your Finances? N N  Housekeeping or managing your Housekeeping? N N    Patient Care Team: Tonia Ghent, MD as PCP - General (Family Medicine) Rockey Situ Kathlene November, MD as PCP - Cardiology (Cardiology) Marybelle Killings, MD as Consulting Physician (Orthopedic Surgery) Zehr, Laban Emperor, PA-C as Physician Assistant (Gastroenterology) Gardiner Barefoot, DPM as Consulting Physician (Podiatry) Thelma Comp, [place] as Referring Physician (Optometry) Renato Shin, MD (Inactive) as Consulting Physician (Endocrinology)  Indicate any recent Medical Services you may have received from other than [facility] providers in the past year (date may be approximate).     Assessment:   This is a routine wellness examination for [place].  I connected with  [name] on 12/30/21 by a audio enabled telemedicine application and verified that I am speaking with the correct person using two identifiers.  Patient Location: Home  Provider Location: Office/Clinic  I discussed the limitations of evaluation and management by telemedicine. The patient expressed understanding and agreed to proceed.   Hearing/Vision screen Hearing Screening - Comments:: Patient is able to hear conversational tones without difficulty.  No issues reported.   Vision Screening - Comments:: @ [facility], wears glasses Scheduled 12/30/21 No retinopathy reported  Dietary issues and exercise activities discussed: Current Exercise Habits: Home exercise routine, Type of exercise: walking, Time (Minutes): 20, Frequency (Times/Week): 2, Weekly Exercise (Minutes/Week): 40, Intensity: Mild Healthy diet; portion controlled Good water intake    Goals Addressed             This Visit's Progress    Follow up with Primary Care Provider       Follow up as needed Weight about 185lb; portion control meals and walk       Depression Screen    12/30/2021   12:44 PM 12/07/2020    2:51 PM 12/05/2019    2:52 PM  12/04/2018   12:02 PM 11/26/2017    9:14 AM 11/23/2016    9:32 AM 08/06/2015    1:35 PM  PHQ 2/9 Scores  PHQ - 2 Score 0 0 0 0 0 0 0  PHQ- 9 Score   0 0 0 0     Fall Risk    12/30/2021   12:47 PM 12/26/2021   11:33 AM 12/17/2021    4:01 PM 12/07/2020    2:50 PM 12/05/2019    2:51 PM  Fall Risk   Falls in the past year? 0 0 0   0 0 0  Number falls in past yr:  0 0   0 0 0  Injury with Fall?  0 0   0 0 0  Risk for fall due to :    No Fall Risks Medication side effect  Follow up Falls evaluation completed   Falls prevention discussed Falls evaluation completed;Falls prevention discussed    FALL RISK PREVENTION PERTAINING TO THE HOME: Home free of loose throw rugs in walkways, pet beds, electrical cords, etc? Yes  Adequate lighting in your home to reduce risk of falls? Yes   ASSISTIVE DEVICES UTILIZED TO PREVENT FALLS: Life alert? No  Use of a cane, walker or w/c? No   TIMED UP AND GO: Was the test performed? No .   Cognitive Function:    12/05/2019    2:53  PM 12/04/2018   12:04 PM 11/26/2017    9:13 AM 11/23/2016    9:20 AM 08/06/2015    1:15 PM  MMSE - Mini Mental State Exam  Orientation to time '5 5 5 5 5  '$ Orientation to Place '5 5 5 5 5  '$ Registration '3 3 3 3 3  '$ Attention/ Calculation 5 4 0 0 0  Recall '3 3 3 2 3  '$ Recall-comments    unable to recall 1 of 3 words   Language- name 2 objects   0 0 0  Language- repeat '1 1 1 1 1  '$ Language- follow 3 step command   '3 3 3  '$ Language- read & follow direction   0 0 0  Write a sentence   0 0 0  Copy design   0 0 0  Total score   '20 19 20        '$ 12/30/2021    1:02 PM  6CIT Screen  What Year? 0 points  What month? 0 points  What time? 0 points  Months in reverse 0 points    Immunizations Immunization History  Administered Date(s) Administered   Fluad Quad(high Dose 65+) 10/02/2018, 10/16/2019, 01/03/2021, 11/03/2021   H1N1 01/30/2008   Influenza Split 12/08/2010, 10/20/2011   Influenza Whole 12/03/2008    Influenza,inj,Quad PF,6+ Mos 12/06/2012, 12/15/2013, 11/17/2014, 11/16/2015, 11/23/2016, 11/26/2017   PFIZER(Purple Top)SARS-COV-2 Vaccination 03/30/2019, 04/21/2019, 10/29/2019   [place] Pediatric Vaccine(14mo to <592yr 01/08/2021   Pneumococcal Conjugate-13 11/26/2017   Pneumococcal Polysaccharide-23 06/12/2013, 12/06/2018   Td 09/06/2001   Tdap 12/07/2011, 08/13/2020   Screening Tests Health Maintenance  Topic Date Due   Lung Cancer Screening  01/17/2022   COVID-19 Vaccine (5 - 2023-24 season) 01/15/2022 (Originally 09/30/2021)   Zoster Vaccines- Shingrix (2 of 2) 01/22/2022   HEMOGLOBIN A1C  02/22/2022   OPHTHALMOLOGY EXAM  08/24/2022   Diabetic kidney evaluation - GFR measurement  11/01/2022   Diabetic kidney evaluation - Urine ACR  11/01/2022   FOOT EXAM  11/04/2022   Medicare Annual Wellness (AWV)  12/31/2022   COLONOSCOPY (Pts 45-4916yrnsurance coverage will need to be confirmed)  05/18/2026   DTaP/Tdap/Td (4 - Td or Tdap) 08/14/2030   Pneumonia Vaccine 65+39ears old  Completed   INFLUENZA VACCINE  Completed   Hepatitis C Screening  Completed   HPV VACCINES  Aged Out   Health Maintenance Health Maintenance Due  Topic Date Due   Lung Cancer Screening  01/17/2022   Lung Cancer Screening: scheduled 01/19/22  Vision Screening: Recommended annual ophthalmology exams for early detection of glaucoma and other disorders of the eye.  Dental Screening: Recommended annual dental exams for proper oral hygiene.  Community Resource Referral / Chronic Care Management: CRR required this visit?  No   CCM required this visit?  No      Plan:     I have personally reviewed and noted the following in the patient's chart:   Medical and social history Use of alcohol, tobacco or illicit drugs  Current medications and supplements including opioid prescriptions. Patient is not currently taking opioid prescriptions. Functional ability and status Nutritional  status Physical activity Advanced directives List of other physicians Hospitalizations, surgeries, and ER visits in previous 12 months Vitals Screenings to include cognitive, depression, and falls Referrals and appointments  In addition, I have reviewed and discussed with patient certain preventive protocols, quality metrics, and best practice recommendations. A written personalized care plan for preventive services as well as general preventive health  recommendations were provided to patient.     Leta Jungling, LPN   21/07/8373

## 2022-01-04 ENCOUNTER — Telehealth: Payer: Self-pay | Admitting: Podiatry

## 2022-01-04 NOTE — Telephone Encounter (Signed)
Safest[employer] called and states that this pt will need to choose a different shoe, the one he chose is no longer available.   Please advise

## 2022-01-05 DIAGNOSIS — Z794 Long term (current) use of insulin: Secondary | ICD-10-CM | POA: Diagnosis not present

## 2022-01-05 DIAGNOSIS — E119 Type 2 diabetes mellitus without complications: Secondary | ICD-10-CM | POA: Diagnosis not present

## 2022-01-06 NOTE — Telephone Encounter (Signed)
Done

## 2022-01-11 ENCOUNTER — Encounter: Payer: Medicare HMO | Attending: Endocrinology | Admitting: Registered"

## 2022-01-11 DIAGNOSIS — E119 Type 2 diabetes mellitus without complications: Secondary | ICD-10-CM | POA: Diagnosis not present

## 2022-01-11 NOTE — Progress Notes (Unsigned)
Diabetes Self-Management Education  Visit Type: Follow-up  Appt. Start Time: 1425 Appt. End Time: 3570  01/12/2022  [name], identified by name and date of birth, is a 71 y.o. male with a diagnosis of Diabetes: Type 2.  ASSESSMENT  There were no vitals taken for this visit. There is no height or weight on file to calculate BMI.  Did not assess weight this visit. Next RD visit I plan to get updated weight reading, consider using body comp scale. Last visit pt states following wife's weight loss plan.  Wt Readings from Last 3 Encounters:  12/30/21 190 lb 12.8 oz (86.5 kg)  11/03/21 199 lb (90.3 kg)  10/12/21 199 lb 8 oz (90.5 kg)    A1c 7.0% (down from 8.3% 04/26/21)  Medication: Pt reports injecting Toujeo 130 u pre-breakfast and Regular insulin 20 units 5-15 min before dinner  Pt is using CGM and brought reader and log sheet that includes FBS readings. Pt is using 3 day FBS readings to titrate Toujeo either up or down 10 units.   When pt remembers to inject 20 units of Reg insulin with dinner, his FBS is 62 - 95 mg/dL. When forgetting to take insulin prior to dinner, FBS >130 mg/dL. Discussed importance of taking mealtime insulin. Pt does not take Reg insulin any other time of day.   Based on [place] readings, to avoid frequent hypoglycemia he should not use Reg insulin during the day until he has a better understanding of how to interpret CGM as well as understanding the 3 macro-nutrients and their effect on blood sugar. Will discuss nutrition more in next visit.  Pt brought a log of his evening meals and his diet consists mostly of burgers, fries, chicken tenders, pinto beans and pork and beans. Pt states he mostly drinks water and diet soda. Will drink a cup of regular soda for low blood sugar.  Pt states he takes snacks such as nabs with him when he is out fishing, which he does often. Pt is awake during the night (sometimes up to 3 am) quilting and his CGM low alarm go  off frequently at that time.   Diabetes Self-Management Education - 01/12/22 1000       Visit Information   Visit Type Follow-up      Initial Visit   Diabetes Type Type 2      Complications   Last HgB A1C per patient/outside source 7 %    How often do you check your blood sugar? > 4 times/day   CGM   Fasting Blood glucose range (mg/dL) 70-129;130-179   recently averaging 70     Patient Education   Medications Reviewed patients medication for diabetes, action, purpose, timing of dose and side effects.    Monitoring Taught/evaluated CGM (comment)    Acute complications Taught prevention, symptoms, and  treatment of hypoglycemia - the 15 rule.      Individualized Goals (developed by patient)   Medications take my medication as prescribed    Monitoring  Consistenly use CGM    Reducing Risk treat hypoglycemia with 15 grams of carbs if blood glucose less than '70mg'$ /dL      Patient Self-Evaluation of Goals - Patient rates self as meeting previously set goals (% of time)   Medications >75% (most of the time)   taking as pt understands titrating instructions   Monitoring < 25% (hardly ever/never)   probably doesn't understand     Outcomes   Expected Outcomes Demonstrated interest in  learning but significant barriers to change   does not understand action of insulin and eval CGM to make decisions   Future DMSE 2 wks    Program Status Not Completed      Subsequent Visit   Since your last visit have you continued or begun to take your medications as prescribed? Yes    Since your last visit, are you checking your blood glucose at least once a day? Yes            Individualized Plan for Diabetes Self-Management Training:   Learning Objective:  Patient will have a greater understanding of diabetes self-management. Patient education plan is to attend individual and/or group sessions per assessed needs and concerns.  Patient Instructions  Reduce your Toujeo to 120 units since your  fasting blood sugar is averaging around 70 mg/dL  Continue taking your 20 units of regular insulin 5-15 min before eating dinner.  If your blood sugar is below 70 continue to have 1/2 cup of regular soda or juice to bring it up and follow up with a meal or snack with protein to help stabilize.   Expected Outcomes:  Demonstrated interest in learning but significant barriers to change (does not understand action of insulin and eval CGM to make decisions)  Education material provided: A1C conversion sheet  If problems or questions, patient to contact team via:  Phone  Future DSME appointment: 2 wks

## 2022-01-11 NOTE — Patient Instructions (Signed)
Reduce your Toujeo to 120 units since your fasting blood sugar is averaging around 70 mg/dL  Continue taking your 20 units of regular insulin 5-15 min before eating dinner.  If your blood sugar is below 70 continue to have 1/2 cup of regular soda or juice to bring it up and follow up with a meal or snack with protein to help stabilize.

## 2022-01-13 ENCOUNTER — Other Ambulatory Visit: Payer: Self-pay | Admitting: Cardiovascular Disease

## 2022-01-13 ENCOUNTER — Other Ambulatory Visit: Payer: Self-pay | Admitting: Endocrinology

## 2022-01-17 ENCOUNTER — Ambulatory Visit: Payer: Medicare HMO

## 2022-01-19 ENCOUNTER — Ambulatory Visit
Admission: RE | Admit: 2022-01-19 | Discharge: 2022-01-19 | Disposition: A | Discharge status: Home / Self Care | Payer: Medicare HMO | Source: Ambulatory Visit | Attending: Acute Care | Admitting: Acute Care

## 2022-01-19 DIAGNOSIS — Z87891 Personal history of nicotine dependence: Secondary | ICD-10-CM | POA: Insufficient documentation

## 2022-02-03 ENCOUNTER — Telehealth: Payer: Self-pay | Admitting: Podiatry

## 2022-02-03 NOTE — Telephone Encounter (Signed)
Lmom for patient to call back regarding his diabetic shoes    Auth expires 02/07/22

## 2022-02-04 DIAGNOSIS — Z794 Long term (current) use of insulin: Secondary | ICD-10-CM | POA: Diagnosis not present

## 2022-02-04 DIAGNOSIS — E119 Type 2 diabetes mellitus without complications: Secondary | ICD-10-CM | POA: Diagnosis not present

## 2022-02-06 ENCOUNTER — Telehealth: Payer: Self-pay | Admitting: Family Medicine

## 2022-02-06 NOTE — Telephone Encounter (Signed)
Patient came in office and dropped off vaccinations record and has been placed in PCP folder.

## 2022-02-06 NOTE — Telephone Encounter (Signed)
I have updated chart and sent forms to scan

## 2022-02-07 ENCOUNTER — Other Ambulatory Visit (INDEPENDENT_AMBULATORY_CARE_PROVIDER_SITE_OTHER): Payer: Medicare Other

## 2022-02-07 ENCOUNTER — Other Ambulatory Visit: Payer: Self-pay | Admitting: Family Medicine

## 2022-02-07 DIAGNOSIS — E119 Type 2 diabetes mellitus without complications: Secondary | ICD-10-CM | POA: Diagnosis not present

## 2022-02-07 DIAGNOSIS — D509 Iron deficiency anemia, unspecified: Secondary | ICD-10-CM | POA: Diagnosis not present

## 2022-02-07 LAB — CBC WITH DIFFERENTIAL/PLATELET
Basophils Absolute: 0.1 10*3/uL (ref 0.0–0.1)
Basophils Relative: 0.7 % (ref 0.0–3.0)
Eosinophils Absolute: 0.2 10*3/uL (ref 0.0–0.7)
Eosinophils Relative: 2.7 % (ref 0.0–5.0)
HCT: 44.9 % (ref 39.0–52.0)
Hemoglobin: 15.3 g/dL (ref 13.0–17.0)
Lymphocytes Relative: 22.4 % (ref 12.0–46.0)
Lymphs Abs: 1.6 10*3/uL (ref 0.7–4.0)
MCHC: 34.1 g/dL (ref 30.0–36.0)
MCV: 95 fl (ref 78.0–100.0)
Monocytes Absolute: 0.7 10*3/uL (ref 0.1–1.0)
Monocytes Relative: 10.2 % (ref 3.0–12.0)
Neutro Abs: 4.6 10*3/uL (ref 1.4–7.7)
Neutrophils Relative %: 64 % (ref 43.0–77.0)
Platelets: 211 10*3/uL (ref 150.0–400.0)
RBC: 4.73 Mil/uL (ref 4.22–5.81)
RDW: 12.8 % (ref 11.5–15.5)
WBC: 7.1 10*3/uL (ref 4.0–10.5)

## 2022-02-07 LAB — BASIC METABOLIC PANEL
BUN: 9 mg/dL (ref 6–23)
CO2: 31 mEq/L (ref 19–32)
Calcium: 10.3 mg/dL (ref 8.4–10.5)
Chloride: 103 mEq/L (ref 96–112)
Creatinine, Ser: 1.03 mg/dL (ref 0.40–1.50)
GFR: 73.8 mL/min (ref >60.00)
Glucose, Bld: 86 mg/dL (ref 70–99)
Potassium: 4.8 mEq/L (ref 3.5–5.1)
Sodium: 144 mEq/L (ref 135–145)

## 2022-02-07 LAB — IRON: Iron: 178 ug/dL — ABNORMAL HIGH (ref 42–165)

## 2022-02-07 LAB — HEMOGLOBIN A1C: Hgb A1c MFr Bld: 7.1 % — ABNORMAL HIGH (ref 4.6–6.5)

## 2022-02-09 ENCOUNTER — Other Ambulatory Visit: Payer: Self-pay

## 2022-02-09 ENCOUNTER — Telehealth: Payer: Self-pay | Admitting: Acute Care

## 2022-02-09 DIAGNOSIS — Z122 Encounter for screening for malignant neoplasm of respiratory organs: Secondary | ICD-10-CM

## 2022-02-09 DIAGNOSIS — Z87891 Personal history of nicotine dependence: Secondary | ICD-10-CM

## 2022-02-09 NOTE — Telephone Encounter (Signed)
This nodule has gone from 9.5 mm a year ago to 9.0 mm this 12/23.  12 month follow up will be due 12/2022. He does have CAD.  He is on a statin, just confirm he is taking it. He is also followed by cardiology. >> Minna Merritts Please fax results to PCP and cards, and order scan for 12/2022. Thanks so much

## 2022-02-09 NOTE — Telephone Encounter (Signed)
Results faxed to PCP and cardiology.  New order placed for annual LDCT

## 2022-02-12 LAB — FRUCTOSAMINE: Fructosamine: 332 umol/L — ABNORMAL HIGH (ref 205–285)

## 2022-02-13 ENCOUNTER — Encounter: Payer: Medicare Other | Attending: Endocrinology | Admitting: Registered"

## 2022-02-13 VITALS — Wt 196.9 lb

## 2022-02-13 DIAGNOSIS — E119 Type 2 diabetes mellitus without complications: Secondary | ICD-10-CM | POA: Diagnosis not present

## 2022-02-13 NOTE — Progress Notes (Signed)
Diabetes Self-Management Education  Visit Type: Follow-up  Appt. Start Time: 1425 Appt. End Time: 3536  02/13/2022  [name], identified by name and date of birth, is a 72 y.o. male with a diagnosis of Diabetes: Type 2.  ASSESSMENT  Weight 196 lb 14.4 oz (89.3 kg). Body mass index is 28.25 kg/m.  Wt Readings from Last 3 Encounters:  02/13/22 196 lb 14.4 oz (89.3 kg)  01/19/22 210 lb (95.3 kg)  12/30/21 190 lb 12.8 oz (86.5 kg)      Medication: Pt reports injecting Toujeo 130 u pre-breakfast and Regular insulin 20 units ~15 min before dinner, Trulicity 1.5 mg on Sunday morning  Pt now remembers to inject 20 units of Reg insulin with dinner, found a way to remember since last visit.   SMBG: Pt continues to use CGM, and keeps daily glucose log. Pt is aware regarding vitamin C dose affecting readings. Pt continues using 3 day FBS readings to titrate Toujeo either up or down 10 units.   Diet: Pt reports 1-2 apples per day, rice that is boiled in a bag not whole grain, likes a variety of non-starchy vegetable intake but does not eat recommended amounts. Pt states he has stopped snacking at night.  Pt states he continues to take snacks such as nabs and fruit with him when he is out fishing. Pt states his low alarm has not gone off during the night.    Diabetes Self-Management Education - 02/13/22 1000       Visit Information   Visit Type Follow-up      Initial Visit   Diabetes Type Type 2      Complications   Last HgB A1C per patient/outside source 7.1 %    How often do you check your blood sugar? > 4 times/day   CGM   Fasting Blood glucose range (mg/dL) <70;130-179      Patient Education   Healthy Eating Plate Method;Food label reading, portion sizes and measuring food.      Individualized Goals (developed by patient)   Nutrition General guidelines for healthy choices and portions discussed      Patient Self-Evaluation of Goals - Patient rates self as meeting  previously set goals (% of time)   Medications >75% (most of the time)    Monitoring >75% (most of the time)      Outcomes   Expected Outcomes Demonstrated interest in learning. Expect positive outcomes    Future DMSE 2 months    Program Status Not Completed      Subsequent Visit   Since your last visit have you continued or begun to take your medications as prescribed? Yes            Individualized Plan for Diabetes Self-Management Training:   Learning Objective:  Patient will have a greater understanding of diabetes self-management. Patient education plan is to attend individual and/or group sessions per assessed needs and concerns.   Patient Instructions  Consider using your rice cooker for cooking brown rice Continue reading labels and choosing whole grain breads  Use Myplate as a guide for balanced eating Increasing vegetables   Expected Outcomes:  Demonstrated interest in learning. Expect positive outcomes  Education material provided: My Carbohydrate guide Horticulturist, commercial), MyPlate - meeting goals on a budget   If problems or questions, patient to contact team via:  Phone  Future DSME appointment: 2 months

## 2022-02-13 NOTE — Patient Instructions (Addendum)
Consider using your rice cooker for cooking brown rice Continue reading labels and choosing whole grain breads  Use Myplate as a guide for balanced eating Increasing vegetables

## 2022-02-14 ENCOUNTER — Ambulatory Visit (INDEPENDENT_AMBULATORY_CARE_PROVIDER_SITE_OTHER): Payer: Medicare Other | Admitting: Family Medicine

## 2022-02-14 ENCOUNTER — Ambulatory Visit (INDEPENDENT_AMBULATORY_CARE_PROVIDER_SITE_OTHER)
Admission: RE | Admit: 2022-02-14 | Discharge: 2022-02-14 | Disposition: A | Discharge status: Home / Self Care | Payer: Medicare Other | Source: Ambulatory Visit | Attending: Family Medicine | Admitting: Family Medicine

## 2022-02-14 ENCOUNTER — Encounter: Payer: Self-pay | Admitting: Family Medicine

## 2022-02-14 ENCOUNTER — Telehealth: Payer: Self-pay

## 2022-02-14 VITALS — BP 120/68 | HR 98 | Temp 98.0°F | Ht 70.0 in | Wt 195.0 lb

## 2022-02-14 DIAGNOSIS — I700 Atherosclerosis of aorta: Secondary | ICD-10-CM | POA: Diagnosis not present

## 2022-02-14 DIAGNOSIS — D509 Iron deficiency anemia, unspecified: Secondary | ICD-10-CM

## 2022-02-14 DIAGNOSIS — M25519 Pain in unspecified shoulder: Secondary | ICD-10-CM | POA: Diagnosis not present

## 2022-02-14 DIAGNOSIS — M25512 Pain in left shoulder: Secondary | ICD-10-CM

## 2022-02-14 MED ORDER — TRAMADOL HCL 50 MG PO TABS: ORAL_TABLET | ORAL | 1 refills | Status: DC

## 2022-02-14 MED ORDER — TOUJEO MAX SOLOSTAR 300 UNIT/ML ~~LOC~~ SOPN: PEN_INJECTOR | SUBCUTANEOUS | Status: DC

## 2022-02-14 MED ORDER — ONETOUCH ULTRA VI STRP: ORAL_STRIP | 12 refills | Status: DC

## 2022-02-14 MED ORDER — TRULICITY 1.5 MG/0.5ML ~~LOC~~ SOAJ: 1.5000 mg | SUBCUTANEOUS | Status: DC

## 2022-02-14 MED ORDER — CYCLOBENZAPRINE HCL 10 MG PO TABS: 10.0000 mg | ORAL_TABLET | Freq: Two times a day (BID) | ORAL | 3 refills | Status: DC | PRN

## 2022-02-14 MED ORDER — NOVOLIN R FLEXPEN 100 UNIT/ML IJ SOPN: 20.0000 [IU] | PEN_INJECTOR | Freq: Every day | INTRAMUSCULAR | Status: DC

## 2022-02-14 MED ORDER — IRON (FERROUS SULFATE) 325 (65 FE) MG PO TABS: ORAL_TABLET | ORAL | Status: DC

## 2022-02-14 NOTE — Telephone Encounter (Signed)
Attempted to contact the patient to inform him that per Dr. Dwyane Dee he does not need to come to labs prior to appt date and lab appt will be canceled. Will send mychart message.

## 2022-02-14 NOTE — Patient Instructions (Addendum)
Recheck labs in about 3 months.   Take care.  Glad to see you. Change iron to Sunday and Wednesday.   Go to the lab on the way out.   If you have mychart we'll likely use that to update you.    Let me know if you don't get a call about PT.  Use tramadol for pain.

## 2022-02-14 NOTE — Progress Notes (Signed)
L shoulder pain, pain with overhead movement.  Going on for 1.5 months.  No injury.  Tramadol helps some.  Flexeril helps some.  No sx with R shoulder.    D/w pt about recent labs.  He is going to f/u with endo, d/w pt.    Anemia resolved.  D/w pt about taking iron Sun and Wed.  We can recheck CBC in about 3 months.    Meds, vitals, and allergies reviewed.   ROS: Per HPI unless specifically indicated in ROS section   Nad Ncat Neck supple, no LA Rrr ctab L shoulder AC not ttp Normal grip.  L shoulder with int > ext rotation pain.   Int rotation pain improve with scapular manipulation.

## 2022-02-15 DIAGNOSIS — M25519 Pain in unspecified shoulder: Secondary | ICD-10-CM | POA: Insufficient documentation

## 2022-02-15 NOTE — Assessment & Plan Note (Signed)
Likely rotator cuff impingement.  Discussed PT, getting plain films, and using tramadol as needed for pain.  Anatomy discussed with patient.  He will update me as needed.

## 2022-02-15 NOTE — Assessment & Plan Note (Signed)
Anemia resolved.  D/w pt about taking iron Sun and Wed.  We can recheck CBC in about 3 months.

## 2022-02-22 ENCOUNTER — Other Ambulatory Visit: Payer: Medicare HMO

## 2022-02-22 ENCOUNTER — Ambulatory Visit: Payer: TRICARE For Life (TFL) | Admitting: Physical Therapy

## 2022-02-24 ENCOUNTER — Ambulatory Visit (INDEPENDENT_AMBULATORY_CARE_PROVIDER_SITE_OTHER): Payer: Medicare Other | Admitting: Endocrinology

## 2022-02-24 ENCOUNTER — Encounter: Payer: Self-pay | Admitting: Endocrinology

## 2022-02-24 VITALS — BP 144/82 | HR 98 | Ht 70.0 in | Wt 197.6 lb

## 2022-02-24 DIAGNOSIS — E1165 Type 2 diabetes mellitus with hyperglycemia: Secondary | ICD-10-CM | POA: Diagnosis not present

## 2022-02-24 DIAGNOSIS — Z794 Long term (current) use of insulin: Secondary | ICD-10-CM

## 2022-02-24 DIAGNOSIS — E11649 Type 2 diabetes mellitus with hypoglycemia without coma: Secondary | ICD-10-CM | POA: Diagnosis not present

## 2022-02-24 MED ORDER — EMPAGLIFLOZIN 10 MG PO TABS: 10.0000 mg | ORAL_TABLET | Freq: Every day | ORAL | 3 refills | Status: DC

## 2022-02-24 NOTE — Patient Instructions (Addendum)
Take 25 R before supper  Toujeo 150 units and switch to pm  Call before refilling Trulicity  With Jardiance reduce Lisinopril to 1/2

## 2022-02-24 NOTE — Progress Notes (Unsigned)
Patient ID: Anthony Cuevas, male   DOB: May 27, 1950, 72 y.o.   MRN: [id number]           Reason for Appointment: Type II Diabetes follow-up   History of Present Illness   Diagnosis date: 1992  Previous history:  Non-insulin hypoglycemic drugs previously used: Actos, metformin stopped in 5/23, glipizide Insulin was started in 2006 and has taken Lantus, NovoLog as well as NPH and regular  A1c range in the last few years is: 6.6-9.9  Recent history:     Non-insulin hypoglycemic drugs: Trulicity 1.5 mg weekly     Insulin regimen: 140 units in a.m., regular insulin 20 units at supper          Side effects from medications: None  Current self management, blood sugar patterns and problems identified:  A1c is 7.1 and about the same Fructosamine is 332  However recent freestyle [employer] indicates a GMI of 7.8  He was started on TRESIBA insulin instead of NPH on his last visit but his blood sugars on an average are only slightly better; however his variability is much less Despite using 140 units of insulin his fasting readings are consistently high and averaging about 180 Weight is about the same recently  He does have the highest readings in the evenings after dinner and is not increasing his mealtime insulin based on what he is eating He thinks he is taking his regular insulin before starting to eat consistently Currently postprandial readings are variable with sometimes significantly high postprandial spikes both at lunch and dinner but generally more in the evenings He has been working with a nutritionist to modify his diet especially types of carbohydrates and snacks Has not been out of his [employer] usually around 6.30 pm  Exercise: walks a little Dietician visit: Most recent: 01/2022  Freestyle libre 2 download for the last 2 weeks shows the following interpretation and data  Moderate variability is present at all times including overnight Overnight blood  sugars are mostly at the upper end of the target range, higher at midnight and for the last 2 days significantly above 180 throughout the night  Premeal blood sugars are also consistently around 180 at lunch and dinner POSTPRANDIAL readings on an average are not rising excessively but still has sporadic spikes after either lunch or dinner, mostly in the evenings  No hypoglycemia   CGM use % of time 94  2-week average/GV 189/27  Time in range      48% was 42  % Time Above 180 41  % Time above 250 11  % Time Below 70      PRE-MEAL Fasting Lunch Dinner Bedtime Overall  Glucose range:       Averages: 182       POST-MEAL PC Breakfast PC Lunch PC Dinner  Glucose range:     Averages: 188 187 214   Prior   CGM use % of time 84  2-week average/GV 196/42  Time in range 42  % Time Above 180 28  % Time above 250 28  % Time Below 70 2     PRE-MEAL Fasting Lunch Dinner Bedtime Overall  Glucose range:       Averages: 185 193 193     POST-MEAL PC Breakfast PC Lunch PC Dinner  Glucose range:     Averages: 190 198 226       Weight control: Max 250  Wt Readings from Last 3 Encounters:  02/24/22 197 lb 9.6 oz (  89.6 kg)  02/14/22 195 lb (88.5 kg)  02/13/22 196 lb 14.4 oz (89.3 kg)            Diabetes labs:  Lab Results  Component Value Date   HGBA1C 7.1 (H) 02/07/2022   HGBA1C 7.0 (A) 08/22/2021   HGBA1C 8.3 (A) 04/26/2021   Lab Results  Component Value Date   MICROALBUR 4.5 (H) 10/31/2021   LDLCALC 14 10/12/2021   CREATININE 1.03 02/07/2022    Lab Results  Component Value Date   FRUCTOSAMINE 332 (H) 02/07/2022   FRUCTOSAMINE 335 (H) 10/31/2021   Lab Results  Component Value Date   HGB 15.3 02/07/2022     Allergies as of 02/24/2022   No Known Allergies      Medication List        Accurate as of February 24, 2022 11:59 PM. If you have any questions, ask your nurse or doctor.          ascorbic acid 500 MG tablet Commonly known as: VITAMIN C Take 1  tablet (500 mg total) by mouth daily.   clopidogrel 75 MG tablet Commonly known as: PLAVIX Take 1 tablet (75 mg total) by mouth daily.   cyanocobalamin 1000 MCG tablet Take 1,000 mcg by mouth daily.   cyclobenzaprine 10 MG tablet Commonly known as: FLEXERIL Take 1 tablet (10 mg total) by mouth 2 (two) times daily as needed for muscle spasms.   empagliflozin 10 MG Tabs tablet Commonly known as: Jardiance Take 1 tablet (10 mg total) by mouth daily with breakfast. Started by: Elayne Snare, MD   FISH OIL PO Take 1,000 mg by mouth once a week.   Iron (Ferrous Sulfate) 325 (65 Fe) MG Tabs Take on Sunday and Wednesday.  2 tabs per week.   latanoprost 0.005 % ophthalmic solution Commonly known as: XALATAN Place 1 drop into both eyes at bedtime.   lisinopril 40 MG tablet Commonly known as: ZESTRIL TAKE 1 TABLET DAILY   NovoLIN R FlexPen ReliOn 100 UNIT/ML KwikPen Generic drug: Insulin Regular Human Inject 20 Units as directed daily with supper.   OneTouch Ultra test strip Generic drug: glucose blood USE AS INSTRUCTED TO TEST BLOOD SUGARS 3 OR 4 TIMES DAILY   rosuvastatin 20 MG tablet Commonly known as: CRESTOR Take 1 tablet (20 mg total) by mouth daily.   Sure Comfort Pen Needles 32G X 4 MM Misc Generic drug: Insulin Pen Needle USE THREE TIMES A DAY   Toujeo Max SoloStar 300 UNIT/ML Solostar Pen Generic drug: insulin glargine (2 Unit Dial) 140 units once daily   traMADol 50 MG tablet Commonly known as: ULTRAM TAKE 1 TABLET THREE TIMES A DAY AS NEEDED FOR MODERATE PAIN   Trulicity 1.5 OE/7.0JJ Sopn Generic drug: Dulaglutide Inject 1.5 mg into the skin once a week.   Vitamin D3 125 MCG (5000 UT) Caps Take 5,000 Units by mouth 2 (two) times a week.        Allergies:  No Known Allergies   Past Medical History:  Diagnosis Date   Arthritis    Coronary atherosclerosis    a. 07/2018 CT Chest: 3 vessel coronary atherosclerosis; b. 07/2018 MV: EF 46% (GI uptake  artifact), No ischemia/infarct. Mild to mod diffuse Ao atherosclerosis and at least moderate 2 vessel CAD (LAD/RCA) on CT imaging. Low risk.   Depression    improved on sertraline   Diabetes mellitus, type 2 ([place]) 09/1996   GERD (gastroesophageal reflux disease) 1990   History of peptic ulcer disease  Hyperlipidemia 1992   Hypertension 05/2003   Lumbar stenosis L2-3   Muscle atrophy    in arms from degenerative changes in neck   Neck pain    chronic   Smoker    Wears glasses     Past Surgical History:  Procedure Laterality Date   ABDOMINAL AORTOGRAM W/LOWER EXTREMITY N/A 09/03/2019   Procedure: ABDOMINAL AORTOGRAM W/LOWER EXTREMITY;  Surgeon: Wellington Hampshire, MD;  Location: [facility];  Service: Cardiovascular;  Laterality: N/A;  Limited Study   CERVICAL DISC SURGERY  04/19/2002   fusion, Dr. Lorin Mercy   CERVICAL DISCECTOMY  01/31/1995   partial   CERVICAL DISCECTOMY  01/30/1998   COLONOSCOPY WITH ESOPHAGOGASTRODUODENOSCOPY (EGD)  06/25/2015   ENTEROSCOPY N/A 07/04/2021   Procedure: ENTEROSCOPY;  Surgeon: Doran Stabler, MD;  Location: [facility];  Service: Gastroenterology;  Laterality: N/A;   HEMORRHOID SURGERY  01/31/1987   HOT HEMOSTASIS N/A 07/04/2021   Procedure: HOT HEMOSTASIS (ARGON PLASMA COAGULATION/BICAP);  Surgeon: Doran Stabler, MD;  Location: [facility];  Service: Gastroenterology;  Laterality: N/A;   KNEE ARTHROPLASTY  11/20/2011   Procedure: COMPUTER ASSISTED TOTAL KNEE ARTHROPLASTY;  Surgeon: Marybelle Killings, MD;  Location: [facility];  Service: Orthopedics;  Laterality: Left;  Conversion Left Knee Medial Uni to Total Knee Arthroplasty-Cemented   LUMBAR LAMINECTOMY/DECOMPRESSION MICRODISCECTOMY N/A 04/28/2015   Procedure: L2-3 Decompression;  Surgeon: Marybelle Killings, MD;  Location: [facility];  Service: Orthopedics;  Laterality: N/A;   MR MRA DUPLICATE EXAM  INACTIVATE     MULTIPLE TOOTH EXTRACTIONS     Neisen funduplication  57/84/6962   PERIPHERAL VASCULAR  INTERVENTION  09/03/2019   Procedure: PERIPHERAL VASCULAR INTERVENTION;  Surgeon: Wellington Hampshire, MD;  Location: [facility];  Service: Cardiovascular;;  Iliac Stent   REPLACEMENT TOTAL KNEE Left 02/14/02, 05/04/02   left- partial   SCC EXCISION  04/25/2001   Dr. March Rummage   STOMACH SURGERY  01/30/1982   P[facility], [place]   SUBMUCOSAL TATTOO INJECTION  07/04/2021   Procedure: SUBMUCOSAL TATTOO INJECTION;  Surgeon: Doran Stabler, MD;  Location: [facility];  Service: Gastroenterology;;    Family History  Problem Relation Age of Onset   Diabetes Mother    Hypertension Mother    Stroke Mother    Diabetes Father    Diabetes Sister    Diabetes Brother    Diabetes Brother    Heart disease Brother    Breast cancer Maternal Grandmother    Lung disease Paternal Grandfather        black lung   Heart disease Son    Arthritis Neg Hx    Prostate cancer Neg Hx    Colon cancer Neg Hx    Stomach cancer Neg Hx     Social History:  reports that he quit smoking about 10 years ago. His smoking use included cigarettes. He has a 41.00 pack-year smoking history. He has never used smokeless tobacco. He reports that he does not drink alcohol and does not use drugs.  Review of Systems:  Last diabetic eye exam date 7/22  Last foot exam date: 10/23  Symptoms of neuropathy: None Foot exam does not show significant deformities or any sensory loss but he has decreased pulses which may qualify him for Medicare approved diabetic shoes  Hypertension:   Treatment includes Lisinopril 40 mg daily  Home BP checked with wrist instrument : Likely around 952-841 systolic, usually not higher   BP Readings from Last 3  Encounters:  02/24/22 (!) 144/82  02/14/22 120/68  11/03/21 (!) 140/82    Lipid management: Treated with Crestor and Zetia by his cardiologist    Lab Results  Component Value Date   CHOL 82 10/12/2021   CHOL 108 09/28/2020   CHOL 135 12/05/2019   Lab Results  Component Value Date    HDL 30 (L) 10/12/2021   HDL 30 (L) 09/28/2020   HDL 34.10 (L) 12/05/2019   Lab Results  Component Value Date   LDLCALC 14 10/12/2021   LDLCALC 45 09/28/2020   LDLCALC 68 07/13/2014   Lab Results  Component Value Date   TRIG 189 (H) 10/12/2021   TRIG 202 (H) 09/28/2020   TRIG 204.0 (H) 12/05/2019   Lab Results  Component Value Date   CHOLHDL 2.7 10/12/2021   CHOLHDL 3.6 09/28/2020   CHOLHDL 4 12/05/2019   Lab Results  Component Value Date   LDLDIRECT 78.0 12/05/2019   LDLDIRECT 71.0 12/04/2018   LDLDIRECT 79.0 11/26/2017     Examination:   BP (!) 144/82 (BP Location: Left Arm, Patient Position: Sitting, Cuff Size: Normal)   Pulse 98   Ht '5\' 10"'$  (1.778 m)   Wt 197 lb 9.6 oz (89.6 kg)   SpO2 97%   BMI 28.35 kg/m   Body mass index is 28.35 kg/m.    ASSESSMENT/ PLAN:    Diabetes type 2 on insulin:   Current regimen: NPH and regular insulin, Trulicity 1.5 mg weekly  See history of present illness for detailed discussion of current diabetes management, blood sugar patterns and problems identified  A1c is 7.1% but fructosamine is high at 332 and recent GMI is 7.8  Blood glucose is less variable with [place] compared to NPH However still appears to be insulin resistant with high blood sugars at all times without consistently high postprandial readings Currently not on adequate doses of Trulicity but he has a 32-monthsupply at home Previously did not appear to benefit from metformin  Recommendations:  Increase Toujeo max another 10 units Take 25 regular insulin at dinnertime and take this 30 minutes before eating More consistent walking for exercise  Start trial of JARDIANCE 10 mg daily, he will be given 2 weeks samples and also prescription With this he will reduce his lisinopril to half a tablet while monitoring his blood pressure regularly Discussed action of SGLT 2 drugs on lowering glucose by decreasing kidney absorption of glucose, benefits of weight loss  and lower blood pressure, possible side effects  Follow-up in 3 months When he is about to finish Trulicity he will be switched to Mounjaro 5 mg weekly  Follow-up in 3 months  Total visit time including counseling time = 30 minutes  Patient Instructions  Take 25 R before supper  Toujeo 150 units and switch to pm  Call before refilling Trulicity  With Jardiance reduce Lisinopril to 1/2    AElayne Snare1/27/2024, 11:25 AM

## 2022-03-02 ENCOUNTER — Encounter: Payer: Self-pay | Admitting: Endocrinology

## 2022-03-06 DIAGNOSIS — Z794 Long term (current) use of insulin: Secondary | ICD-10-CM | POA: Diagnosis not present

## 2022-03-06 DIAGNOSIS — E119 Type 2 diabetes mellitus without complications: Secondary | ICD-10-CM | POA: Diagnosis not present

## 2022-03-11 ENCOUNTER — Other Ambulatory Visit: Payer: Self-pay

## 2022-03-11 MED ORDER — EMPAGLIFLOZIN 10 MG PO TABS: 10.0000 mg | ORAL_TABLET | Freq: Every day | ORAL | 1 refills | Status: DC

## 2022-03-15 ENCOUNTER — Telehealth: Payer: Self-pay | Admitting: Family Medicine

## 2022-03-15 NOTE — Telephone Encounter (Signed)
Cancelled appt/referral Received: Today Virl Cagey, CMA  Tonia Ghent, MD FYI  Per appt notes, patient was scheduled 02/22/22 with [facility] Pt cancelled this appt/referral  Reason **Feeling Better (And will go to [facility])  ======= Noted.  Thanks

## 2022-03-22 ENCOUNTER — Ambulatory Visit (INDEPENDENT_AMBULATORY_CARE_PROVIDER_SITE_OTHER): Payer: Medicare Other | Admitting: Podiatry

## 2022-03-22 ENCOUNTER — Encounter: Payer: Self-pay | Admitting: Podiatry

## 2022-03-22 DIAGNOSIS — M79676 Pain in unspecified toe(s): Secondary | ICD-10-CM

## 2022-03-22 DIAGNOSIS — E119 Type 2 diabetes mellitus without complications: Secondary | ICD-10-CM

## 2022-03-22 DIAGNOSIS — B351 Tinea unguium: Secondary | ICD-10-CM

## 2022-03-22 NOTE — Progress Notes (Signed)
This patient returns to my office for at risk foot care.  This patient requires this care by a professional since this patient will be at risk due to having type 2 diabetes.  This patient is unable to cut nails himself since the patient cannot reach his nails.These nails are painful walking and wearing shoes.  This patient presents for at risk foot care today.  General Appearance  Alert, conversant and in no acute stress.  Vascular  Dorsalis pedis and posterior tibial  pulses are palpable  bilaterally.  Capillary return is within normal limits  bilaterally. Temperature is within normal limits  bilaterally.  Neurologic  Senn-Weinstein monofilament wire test diminished  bilaterally. Muscle power within normal limits bilaterally.  Nails Thick disfigured discolored nails with subungual debris  from hallux to fifth toes bilaterally. No evidence of bacterial infection or drainage bilaterally.  Orthopedic  No limitations of motion  feet .  No crepitus or effusions noted.  No bony pathology or digital deformities noted.  HAV  B/L.  Skin  normotropic skin with no porokeratosis noted bilaterally.  No signs of infections or ulcers noted.     Onychomycosis  Pain in right toes  Pain in left toes  Consent was obtained for treatment procedures.   Mechanical debridement of nails 1-5  bilaterally performed with a nail nipper.  Filed with dremel without incident.    Return office visit    10   weeks                  Told patient to return for periodic foot care and evaluation due to potential at risk complications.   Gardiner Barefoot DPM

## 2022-03-27 ENCOUNTER — Other Ambulatory Visit: Payer: Self-pay | Admitting: Endocrinology

## 2022-03-27 ENCOUNTER — Other Ambulatory Visit: Payer: Self-pay

## 2022-03-27 DIAGNOSIS — E11649 Type 2 diabetes mellitus with hypoglycemia without coma: Secondary | ICD-10-CM

## 2022-03-27 MED ORDER — NOVOLIN R FLEXPEN 100 UNIT/ML IJ SOPN: 20.0000 [IU] | PEN_INJECTOR | Freq: Every day | INTRAMUSCULAR | 3 refills | Status: DC

## 2022-03-28 NOTE — Telephone Encounter (Signed)
Lmom for patient to call back to schedule pick up diabetic shoes    Expires  06/22/22

## 2022-04-03 ENCOUNTER — Ambulatory Visit: Payer: Medicare Other

## 2022-04-03 DIAGNOSIS — M2010 Hallux valgus (acquired), unspecified foot: Secondary | ICD-10-CM

## 2022-04-03 DIAGNOSIS — E119 Type 2 diabetes mellitus without complications: Secondary | ICD-10-CM

## 2022-04-03 NOTE — Progress Notes (Unsigned)
Patient presents today to pick up diabetic shoes .  Patient was dispensed 1 pair of diabetic shoes .  He tried on the shoes and the fit was satisfactory.   Will follow up next year for new order.

## 2022-04-05 DIAGNOSIS — E119 Type 2 diabetes mellitus without complications: Secondary | ICD-10-CM | POA: Diagnosis not present

## 2022-04-05 DIAGNOSIS — Z794 Long term (current) use of insulin: Secondary | ICD-10-CM | POA: Diagnosis not present

## 2022-04-13 ENCOUNTER — Telehealth: Payer: Self-pay | Admitting: Endocrinology

## 2022-04-13 DIAGNOSIS — E11649 Type 2 diabetes mellitus with hypoglycemia without coma: Secondary | ICD-10-CM

## 2022-04-13 NOTE — Telephone Encounter (Signed)
Patient called and requested an RX for the [employer] 3 sensors and reader.  Pharmacy is [facility], [place]  Call back [phone]

## 2022-04-17 ENCOUNTER — Encounter: Payer: Medicare Other | Attending: Endocrinology | Admitting: Registered"

## 2022-04-17 ENCOUNTER — Encounter: Payer: Self-pay | Admitting: Registered"

## 2022-04-17 DIAGNOSIS — E119 Type 2 diabetes mellitus without complications: Secondary | ICD-10-CM

## 2022-04-17 NOTE — Progress Notes (Signed)
Diabetes Self-Management Education  Visit Type: Follow-up  Appt. Start Time: 1017 Appt. End Time: 1100  04/17/2022  Anthony Cuevas, identified by name and date of birth, is a 72 y.o. male with a diagnosis of Diabetes: Type 2.   ASSESSMENT  There were no vitals taken for this visit. There is no height or weight on file to calculate BMI.  Wt Readings from Last 3 Encounters:  02/24/22 197 lb 9.6 oz (89.6 kg)  02/14/22 195 lb (88.5 kg)  02/13/22 196 lb 14.4 oz (89.3 kg)      Medication: Pt reports Toujeo increased to 150 u pre-breakfast and Regular insulin 25 units ~15 min before dinner, continues Trulicity 1.5 mg on Sunday morning. Started Jardiance in the morning.  SMBG: Pt states he is getting Elenor Legato message bad sensors. Pt states he sometimes uses over patches to help it stick 14 days. Pt has watched video on best way to put on and use Libre, per written log sheet FBS has been 59 - 243 mg/dL.  Diet: Pt reports he continues to eat 1-2 apples per day usually for a snack in between meals.  Pt states lately he has been eating poptarts for snacks when out fishing. Per CGM his blood sugar is going over 300 mg/dL after this snack.  Pt states he usually eats dinner by 6 pm. Pt states he tries to eat a decent meal but sometimes gets foods that his friend wants to eat, such as pizza the other night.  Physical activity: Pt states he walks 390 feet up to mail box daily. Walks almost 1 mile 2x week with wife. Sometimes she can make it 1/2 mile without stopping. Pt states he can tell he has had muscle loss in arms. Pt states he has resistance bands and uses them ~2x/month.   Diabetes Self-Management Education - 04/17/22 1000       Visit Information   Visit Type Follow-up      Initial Visit   Diabetes Type Type 2      Complications   How often do you check your blood sugar? > 4 times/day    Fasting Blood glucose range (mg/dL) --   59-243     Dietary Intake   Breakfast whole  wheat toast, jam    Snack (morning) apple    Lunch bologna, tomato, diet soda    Snack (afternoon) apple    Dinner sausage, egg, tator tots with honey mustard    Beverage(s) water, diet soda      Activity / Exercise   Activity / Exercise Type Light (walking / raking leaves)    How many days per week do you exercise? 2    How many minutes per day do you exercise? 20    Total minutes per week of exercise 40      Patient Education   Healthy Eating Meal options for control of blood glucose level and chronic complications.    Being Active Other (comment)   resistance for muscle/bone health   Medications Reviewed patients medication for diabetes, action, purpose, timing of dose and side effects.    Monitoring Taught/evaluated CGM (comment)   afternoon spikes     Individualized Goals (developed by patient)   Nutrition Other (comment)   snacks   Physical Activity Exercise 1-2 times per week    Reducing Risk treat hypoglycemia with 15 grams of carbs if blood glucose less than 70mg /dL   pt doesn' want to do anything for low blood sugar  Outcomes   Expected Outcomes Demonstrated interest in learning. Expect positive outcomes    Future DMSE 2 months    Program Status Not Completed      Subsequent Visit   Since your last visit have you continued or begun to take your medications as prescribed? Yes    Since your last visit, are you checking your blood glucose at least once a day? Yes           Individualized Plan for Diabetes Self-Management Training:   Learning Objective:  Patient will have a greater understanding of diabetes self-management. Patient education plan is to attend individual and/or group sessions per assessed needs and concerns.  Patient Instructions  To help prevent high blood sugar in the morning, when having pizza try to have earlier in the day (avoid high fat food for dinner). Also have vegetables with it.   Physical activity: using your resistance bands for 15  min 2x/ week.  Continue walking 1 mi 2x/week with wife.  To avoid high blood sugar while fishing, take other snacks that won't raise blood sugar so high. Protein bars, nuts, string cheese, tuna or chicken snack pack, etc, will cause less of a rise than poptarts.   Expected Outcomes:  Demonstrated interest in learning. Expect positive outcomes  Education material provided: none  If problems or questions, patient to contact team via:  Phone and MyChart  Future DSME appointment: 2 months

## 2022-04-17 NOTE — Patient Instructions (Addendum)
To help prevent high blood sugar in the morning, when having pizza try to have earlier in the day (avoid high fat food for dinner). Also have vegetables with it.   Physical activity: using your resistance bands for 15 min 2x/ week.  Continue walking 1 mi 2x/week with wife.  To avoid high blood sugar while fishing, take other snacks that won't raise blood sugar so high. Protein bars, nuts, string cheese, tuna or chicken snack pack, etc, will cause less of a rise than poptarts.

## 2022-04-19 MED ORDER — FREESTYLE LIBRE 3 SENSOR MISC: 3 refills | Status: DC

## 2022-04-19 NOTE — Telephone Encounter (Signed)
RX sent

## 2022-05-05 DIAGNOSIS — Z794 Long term (current) use of insulin: Secondary | ICD-10-CM | POA: Diagnosis not present

## 2022-05-05 DIAGNOSIS — E119 Type 2 diabetes mellitus without complications: Secondary | ICD-10-CM | POA: Diagnosis not present

## 2022-05-12 ENCOUNTER — Other Ambulatory Visit (INDEPENDENT_AMBULATORY_CARE_PROVIDER_SITE_OTHER): Payer: Medicare Other

## 2022-05-12 DIAGNOSIS — D509 Iron deficiency anemia, unspecified: Secondary | ICD-10-CM

## 2022-05-12 DIAGNOSIS — E11649 Type 2 diabetes mellitus with hypoglycemia without coma: Secondary | ICD-10-CM | POA: Diagnosis not present

## 2022-05-12 LAB — CBC WITH DIFFERENTIAL/PLATELET
Basophils Absolute: 0 10*3/uL (ref 0.0–0.1)
Basophils Relative: 0.6 % (ref 0.0–3.0)
Eosinophils Absolute: 0.1 10*3/uL (ref 0.0–0.7)
Eosinophils Relative: 2.3 % (ref 0.0–5.0)
HCT: 46.7 % (ref 39.0–52.0)
Hemoglobin: 15.8 g/dL (ref 13.0–17.0)
Lymphocytes Relative: 20.6 % (ref 12.0–46.0)
Lymphs Abs: 1.3 10*3/uL (ref 0.7–4.0)
MCHC: 33.8 g/dL (ref 30.0–36.0)
MCV: 96.2 fl (ref 78.0–100.0)
Monocytes Absolute: 0.8 10*3/uL (ref 0.1–1.0)
Monocytes Relative: 13 % — ABNORMAL HIGH (ref 3.0–12.0)
Neutro Abs: 3.9 10*3/uL (ref 1.4–7.7)
Neutrophils Relative %: 63.5 % (ref 43.0–77.0)
Platelets: 185 10*3/uL (ref 150.0–400.0)
RBC: 4.85 Mil/uL (ref 4.22–5.81)
RDW: 12.9 % (ref 11.5–15.5)
WBC: 6.2 10*3/uL (ref 4.0–10.5)

## 2022-05-12 LAB — BASIC METABOLIC PANEL
BUN: 20 mg/dL (ref 6–23)
CO2: 25 mEq/L (ref 19–32)
Calcium: 9.6 mg/dL (ref 8.4–10.5)
Chloride: 105 mEq/L (ref 96–112)
Creatinine, Ser: 1.07 mg/dL (ref 0.40–1.50)
GFR: 70.37 mL/min (ref >60.00)
Glucose, Bld: 114 mg/dL — ABNORMAL HIGH (ref 70–99)
Potassium: 4.6 mEq/L (ref 3.5–5.1)
Sodium: 142 mEq/L (ref 135–145)

## 2022-05-12 LAB — HEMOGLOBIN A1C: Hgb A1c MFr Bld: 7 % — ABNORMAL HIGH (ref 4.6–6.5)

## 2022-05-12 LAB — IRON: Iron: 89 ug/dL (ref 42–165)

## 2022-05-16 ENCOUNTER — Other Ambulatory Visit: Payer: Self-pay

## 2022-05-16 ENCOUNTER — Other Ambulatory Visit: Payer: TRICARE For Life (TFL)

## 2022-05-16 ENCOUNTER — Other Ambulatory Visit: Payer: Self-pay | Admitting: Endocrinology

## 2022-05-16 ENCOUNTER — Encounter: Payer: Self-pay | Admitting: Endocrinology

## 2022-05-16 ENCOUNTER — Telehealth: Payer: Self-pay

## 2022-05-16 ENCOUNTER — Ambulatory Visit (INDEPENDENT_AMBULATORY_CARE_PROVIDER_SITE_OTHER): Payer: Medicare Other | Admitting: Endocrinology

## 2022-05-16 VITALS — BP 124/82 | HR 80 | Ht 70.0 in | Wt 195.0 lb

## 2022-05-16 DIAGNOSIS — I10 Essential (primary) hypertension: Secondary | ICD-10-CM

## 2022-05-16 DIAGNOSIS — Z794 Long term (current) use of insulin: Secondary | ICD-10-CM

## 2022-05-16 DIAGNOSIS — E1165 Type 2 diabetes mellitus with hyperglycemia: Secondary | ICD-10-CM

## 2022-05-16 DIAGNOSIS — E11649 Type 2 diabetes mellitus with hypoglycemia without coma: Secondary | ICD-10-CM

## 2022-05-16 MED ORDER — EMPAGLIFLOZIN 25 MG PO TABS
25.0000 mg | ORAL_TABLET | Freq: Every day | ORAL | 0 refills | Status: DC
Start: 2022-05-16 — End: 2022-05-16

## 2022-05-16 MED ORDER — FREESTYLE LIBRE 3 SENSOR MISC
3 refills | Status: AC
Start: 2022-05-16 — End: ?

## 2022-05-16 MED ORDER — TRULICITY 3 MG/0.5ML ~~LOC~~ SOAJ: 3.0000 mg | SUBCUTANEOUS | 1 refills | Status: DC

## 2022-05-16 MED ORDER — FREESTYLE LIBRE 3 SENSOR MISC
3 refills | Status: DC
Start: 2022-05-16 — End: 2022-05-16

## 2022-05-16 MED ORDER — NOVOLOG FLEXPEN 100 UNIT/ML ~~LOC~~ SOPN: PEN_INJECTOR | SUBCUTANEOUS | 1 refills | Status: DC

## 2022-05-16 NOTE — Telephone Encounter (Signed)
Order submitted through [place] for [place] [facility] as I was unable to send through computer.

## 2022-05-16 NOTE — Progress Notes (Signed)
Patient ID: Anthony Cuevas, male   DOB: 1950/02/13, 72 y.o.   MRN: [id number]           Reason for Appointment: Type II Diabetes follow-up   History of Present Illness   Diagnosis date: 1992  Previous history:  Non-insulin hypoglycemic drugs previously used: Actos, metformin stopped in 5/23, glipizide Insulin was started in 2006 and has taken Lantus, NovoLog as well as NPH and regular  A1c range in the last few years is: 6.6-9.9  Recent history:     Non-insulin hypoglycemic drugs: Trulicity 1.5 mg weekly     Insulin regimen: Toujeo 150 units in a.m., regular insulin 25 units at supper          Side effects from medications: None  Current self management, blood sugar patterns and problems identified:  A1c is 7.0 and about the same Fructosamine last 332   He was started on JARDIANCE on his last visit to improve his control and reduce insulin requirement With this his fasting and overnight blood sugars are dramatically better, previously averaging 188 With current dose of TRESIBA insulin he has sporadically had hypoglycemia overnight also although asymptomatic Currently however most of his hyperglycemia is related to his evening meals despite taking 25 units of regular insulin which was increased before Also periodically will have high readings after lunch He thinks he is taking his regular insulin 15-minute before eating Overall tries to cut back on carbohydrates and avoid high-fat foods using an air Eunice Blase Weight is about the same He is monitoring fairly regularly with his Josephine Igo with only occasional gaps in his data  Exercise: Some walking  Dietician visit: Most recent:/24  Freestyle libre 2 download for the last 2 weeks shows the following interpretation and data  Overnight blood sugars are in the low normal range between 4 AM-8 AM and has had blood sugars in the low normal or slightly low on 3 or 4 occasions However blood sugars tend to be progressively higher during  the daytime with highest reading about 10 PM Has significant variability during the day and some of the hypoglycemia is rebounding from the low normal sugars in the mornings POSTPRANDIAL blood sugars are mostly high after dinner and frequently go up over 200 with AVERAGE 207 Periodically will have high readings after lunch also with average 183 As above hypoglycemia is occurring mostly between 2 AM-8 AM.  Rectal overnight and rarely low normal readings before dinner Time in range is 62 compared to 48% previously AVERAGE glucose on CGM was 154, GV 40%   Previous data:  CGM use % of time 94  2-week average/GV 189/27  Time in range      48% was 42  % Time Above 180 41  % Time above 250 11  % Time Below 70      PRE-MEAL Fasting Lunch Dinner Bedtime Overall  Glucose range:       Averages: 182       POST-MEAL PC Breakfast PC Lunch PC Dinner  Glucose range:     Averages: 188 187 214     Weight control: Previous maximum 250  Wt Readings from Last 3 Encounters:  05/16/22 195 lb (88.5 kg)  02/24/22 197 lb 9.6 oz (89.6 kg)  02/14/22 195 lb (88.5 kg)            Diabetes labs:  Lab Results  Component Value Date   HGBA1C 7.0 (H) 05/12/2022   HGBA1C 7.1 (H) 02/07/2022   HGBA1C 7.0 (A) 08/22/2021  Lab Results  Component Value Date   MICROALBUR 4.5 (H) 10/31/2021   LDLCALC 14 10/12/2021   CREATININE 1.07 05/12/2022    Lab Results  Component Value Date   FRUCTOSAMINE 332 (H) 02/07/2022   FRUCTOSAMINE 335 (H) 10/31/2021   Lab Results  Component Value Date   HGB 15.8 05/12/2022     Allergies as of 05/16/2022   No Known Allergies      Medication List        Accurate as of May 16, 2022  3:36 PM. If you have any questions, ask your nurse or doctor.          STOP taking these medications    NovoLIN R FlexPen ReliOn 100 UNIT/ML KwikPen Generic drug: Insulin Regular Human Stopped by: Reather Littler, MD   Trulicity 1.5 MG/0.5ML Sopn Generic drug:  Dulaglutide Replaced by: Trulicity 3 MG/0.5ML Sopn Stopped by: Reather Littler, MD       TAKE these medications    ascorbic acid 500 MG tablet Commonly known as: VITAMIN C Take 1 tablet (500 mg total) by mouth daily.   clopidogrel 75 MG tablet Commonly known as: PLAVIX Take 1 tablet (75 mg total) by mouth daily.   cyanocobalamin 1000 MCG tablet Take 1,000 mcg by mouth daily.   cyclobenzaprine 10 MG tablet Commonly known as: FLEXERIL Take 1 tablet (10 mg total) by mouth 2 (two) times daily as needed for muscle spasms.   empagliflozin 25 MG Tabs tablet Commonly known as: Jardiance Take 1 tablet (25 mg total) by mouth daily before breakfast. What changed:  medication strength how much to take when to take this Changed by: Reather Littler, MD   FISH OIL PO Take 1,000 mg by mouth once a week.   FreeStyle Libre 3 Sensor Misc Place 1 sensor on the skin every 14 days. Use to check glucose continuously   Iron (Ferrous Sulfate) 325 (65 Fe) MG Tabs Take on Sunday and Wednesday.  2 tabs per week.   latanoprost 0.005 % ophthalmic solution Commonly known as: XALATAN Place 1 drop into both eyes at bedtime.   lisinopril 40 MG tablet Commonly known as: ZESTRIL TAKE 1 TABLET DAILY   NovoLOG FlexPen 100 UNIT/ML FlexPen Generic drug: insulin aspart 30 units before meals as directed Started by: Reather Littler, MD   OneTouch Ultra test strip Generic drug: glucose blood USE AS INSTRUCTED TO TEST BLOOD SUGARS 3 OR 4 TIMES DAILY   rosuvastatin 20 MG tablet Commonly known as: CRESTOR Take 1 tablet (20 mg total) by mouth daily.   Sure Comfort Pen Needles 32G X 4 MM Misc Generic drug: Insulin Pen Needle USE THREE TIMES A DAY   Toujeo Max SoloStar 300 UNIT/ML Solostar Pen Generic drug: insulin glargine (2 Unit Dial) ADMINISTER 100 UNITS ONCE DAILY AND ADJUSTING AS DIRECTED TO MAXIMUM DOSE OF 140 UNITS DAILY   traMADol 50 MG tablet Commonly known as: ULTRAM TAKE 1 TABLET THREE TIMES A  DAY AS NEEDED FOR MODERATE PAIN   Trulicity 3 MG/0.5ML Sopn Generic drug: Dulaglutide Inject 3 mg into the skin once a week. Replaces: Trulicity 1.5 MG/0.5ML Sopn Started by: Reather Littler, MD   Vitamin D3 125 MCG (5000 UT) Caps Take 5,000 Units by mouth 2 (two) times a week.        Allergies:  No Known Allergies   Past Medical History:  Diagnosis Date   Arthritis    Coronary atherosclerosis    a. 07/2018 CT Chest: 3 vessel coronary atherosclerosis; b. 07/2018 MV:  EF 46% (GI uptake artifact), No ischemia/infarct. Mild to mod diffuse Ao atherosclerosis and at least moderate 2 vessel CAD (LAD/RCA) on CT imaging. Low risk.   Depression    improved on sertraline   Diabetes mellitus, type 2 09/1996   GERD (gastroesophageal reflux disease) 1990   History of peptic ulcer disease    Hyperlipidemia 1992   Hypertension 05/2003   Lumbar stenosis L2-3   Muscle atrophy    in arms from degenerative changes in neck   Neck pain    chronic   Smoker    Wears glasses     Past Surgical History:  Procedure Laterality Date   ABDOMINAL AORTOGRAM W/LOWER EXTREMITY N/A 09/03/2019   Procedure: ABDOMINAL AORTOGRAM W/LOWER EXTREMITY;  Surgeon: Iran Ouch, MD;  Location: [facility];  Service: Cardiovascular;  Laterality: N/A;  Limited Study   CERVICAL DISC SURGERY  04/19/2002   fusion, Dr. Ophelia Charter   CERVICAL DISCECTOMY  01/31/1995   partial   CERVICAL DISCECTOMY  01/30/1998   COLONOSCOPY WITH ESOPHAGOGASTRODUODENOSCOPY (EGD)  06/25/2015   ENTEROSCOPY N/A 07/04/2021   Procedure: ENTEROSCOPY;  Surgeon: Sherrilyn Rist, MD;  Location: [facility];  Service: Gastroenterology;  Laterality: N/A;   HEMORRHOID SURGERY  01/31/1987   HOT HEMOSTASIS N/A 07/04/2021   Procedure: HOT HEMOSTASIS (ARGON PLASMA COAGULATION/BICAP);  Surgeon: Sherrilyn Rist, MD;  Location: [facility];  Service: Gastroenterology;  Laterality: N/A;   KNEE ARTHROPLASTY  11/20/2011   Procedure: COMPUTER ASSISTED  TOTAL KNEE ARTHROPLASTY;  Surgeon: Eldred Manges, MD;  Location: [facility];  Service: Orthopedics;  Laterality: Left;  Conversion Left Knee Medial Uni to Total Knee Arthroplasty-Cemented   LUMBAR LAMINECTOMY/DECOMPRESSION MICRODISCECTOMY N/A 04/28/2015   Procedure: L2-3 Decompression;  Surgeon: Eldred Manges, MD;  Location: [facility] OR;  Service: Orthopedics;  Laterality: N/A;   MR MRA DUPLICATE EXAM  INACTIVATE     MULTIPLE TOOTH EXTRACTIONS     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 Samuella Cota   STOMACH SURGERY  01/30/1982   [facility]UD, [facility]   SUBMUCOSAL TATTOO INJECTION  07/04/2021   Procedure: SUBMUCOSAL TATTOO INJECTION;  Surgeon: Sherrilyn Rist, MD;  Location: [facility];  Service: Gastroenterology;;    Family History  Problem Relation Age of Onset   Diabetes Mother    Hypertension Mother    Stroke Mother    Diabetes Father    Diabetes Sister    Diabetes Brother    Diabetes Brother    Heart disease Brother    Breast cancer Maternal Grandmother    Lung disease Paternal Grandfather        black lung   Heart disease Son    Arthritis Neg Hx    Prostate cancer Neg Hx    Colon cancer Neg Hx    Stomach cancer Neg Hx     Social History:  reports that he quit smoking about 10 years ago. His smoking use included cigarettes. He has a 41.00 pack-year smoking history. He has never used smokeless tobacco. He reports that he does not drink alcohol and does not use drugs.  Review of Systems:  Last diabetic eye exam date 7/22  Last foot exam date: 10/23  Symptoms of  neuropathy: None Foot exam does not show significant deformities or any sensory loss but he has decreased pulses which may qualify him for Medicare approved  diabetic shoes  Hypertension:   Treatment includes Lisinopril 40 mg daily  Home BP checked with wrist instrument : He thinks blood sugars are around 120 systolic but does not remember specific readings He did not cut down his lisinopril to half tablet as recommended with starting Jardiance   BP Readings from Last 3 Encounters:  05/16/22 124/82  02/24/22 (!) 144/82  02/14/22 120/68    Lipid management: Treated with Crestor and Zetia by his cardiologist    Lab Results  Component Value Date   CHOL 82 10/12/2021   CHOL 108 09/28/2020   CHOL 135 12/05/2019   Lab Results  Component Value Date   HDL 30 (L) 10/12/2021   HDL 30 (L) 09/28/2020   HDL 34.10 (L) 12/05/2019   Lab Results  Component Value Date   LDLCALC 14 10/12/2021   LDLCALC 45 09/28/2020   LDLCALC 68 07/13/2014   Lab Results  Component Value Date   TRIG 189 (H) 10/12/2021   TRIG 202 (H) 09/28/2020   TRIG 204.0 (H) 12/05/2019   Lab Results  Component Value Date   CHOLHDL 2.7 10/12/2021   CHOLHDL 3.6 09/28/2020   CHOLHDL 4 12/05/2019   Lab Results  Component Value Date   LDLDIRECT 78.0 12/05/2019   LDLDIRECT 71.0 12/04/2018   LDLDIRECT 79.0 11/26/2017     Examination:   BP 124/82 (BP Location: Left Arm, Patient Position: Sitting, Cuff Size: Large)   Pulse 80   Ht 5\' 10"  (1.778 m)   Wt 195 lb (88.5 kg)   SpO2 96%   BMI 27.98 kg/m   Body mass index is 27.98 kg/m.    ASSESSMENT/ PLAN:    Diabetes type 2 on insulin:   Current regimen: and regular insulin, Trulicity 1.5 mg weekly, Jardiance 10 mg  See history of present illness for detailed discussion of current diabetes management, blood sugar patterns and problems identified  A1c is 7% and about the same  With adding Jardiance his overnight blood sugars are significantly lower However has still significant postprandial hyperglycemia at least at dinnertime Day-to-day management was discussed in detail He still requires relatively large  doses of insulin especially Toujeo  Recommendations:  Reduce Toujeo max down to 130 and if still getting low reduce another 10 units Take 30 units regular insulin before supper but if blood sugars are still high after dinner may need to go up to 35 for larger meals He will also try to switch to NOVOLOG as long as this is covered and prescription was sent to [facility] May also need some NovoLog at lunch if eating large carbohydrate meal Increase Jardiance to 2 tablets for now and then next prescription will be for 25 mg Increase Trulicity to 3 mg Discussed blood sugar targets fasting and after meals To call if he is having hypoglycemia  Hypertension: Controlled but since Jardiance is currently increasing he will reduce his lisinopril to 20 mg as a precaution  Next microalbumin check will be in October  Total visit time including counseling time = 30 minutes  Patient Instructions  Novolog will replace NOVOLIN R NovoLog can be taken 5 to 10 minutes before eating  For now we will increase the dose of Novolin R or NovoLog to 30 units Also take 15 to 20 units if eating a larger meal at lunch with more carbohydrate  TOUJEO insulin: Reduce the dose to 130 units  JARDIANCE: Take 2 tablets of the 10 mg daily and next prescription will be for 25 mg  Reduce the LISINOPRIL 40 mg to half a tablet   Reather Littler 05/16/2022, 3:36 PM

## 2022-05-16 NOTE — Patient Instructions (Addendum)
Novolog will replace NOVOLIN R NovoLog can be taken 5 to 10 minutes before eating  For now we will increase the dose of Novolin R or NovoLog to 30 units Also take 15 to 20 units if eating a larger meal at lunch with more carbohydrate  TOUJEO insulin: Reduce the dose to 130 units  JARDIANCE: Take 2 tablets of the 10 mg daily and next prescription will be for 25 mg  Reduce the LISINOPRIL 40 mg to half a tablet

## 2022-05-17 ENCOUNTER — Encounter: Payer: Self-pay | Admitting: Endocrinology

## 2022-05-17 MED ORDER — EMPAGLIFLOZIN 25 MG PO TABS: 25.0000 mg | ORAL_TABLET | Freq: Every day | ORAL | 0 refills | Status: DC

## 2022-05-25 ENCOUNTER — Other Ambulatory Visit: Payer: Self-pay | Admitting: Family Medicine

## 2022-05-25 ENCOUNTER — Other Ambulatory Visit: Payer: Self-pay | Admitting: Cardiovascular Disease

## 2022-05-25 DIAGNOSIS — H401133 Primary open-angle glaucoma, bilateral, severe stage: Secondary | ICD-10-CM | POA: Diagnosis not present

## 2022-05-25 DIAGNOSIS — H401131 Primary open-angle glaucoma, bilateral, mild stage: Secondary | ICD-10-CM | POA: Diagnosis not present

## 2022-05-26 NOTE — Telephone Encounter (Signed)
Refill request for traMADol (ULTRAM) 50 MG tablets  LOV - 02/14/22 Next OV - not scheduled Last refill - 02/14/22 #135/1  *Also needs cyclobenzaprine refilled*

## 2022-05-31 ENCOUNTER — Encounter: Payer: Self-pay | Admitting: Podiatry

## 2022-05-31 ENCOUNTER — Ambulatory Visit (INDEPENDENT_AMBULATORY_CARE_PROVIDER_SITE_OTHER): Payer: Medicare Other | Admitting: Podiatry

## 2022-05-31 DIAGNOSIS — B351 Tinea unguium: Secondary | ICD-10-CM

## 2022-05-31 DIAGNOSIS — M79676 Pain in unspecified toe(s): Secondary | ICD-10-CM

## 2022-05-31 DIAGNOSIS — E119 Type 2 diabetes mellitus without complications: Secondary | ICD-10-CM | POA: Diagnosis not present

## 2022-05-31 NOTE — Progress Notes (Signed)
This patient returns to my office for at risk foot care.  This patient requires this care by a professional since this patient will be at risk due to having type 2 diabetes.  This patient is unable to cut nails himself since the patient cannot reach his nails.These nails are painful walking and wearing shoes.  This patient presents for at risk foot care today. ° °General Appearance  Alert, conversant and in no acute stress. ° °Vascular  Dorsalis pedis and posterior tibial  pulses are palpable  bilaterally.  Capillary return is within normal limits  bilaterally. Temperature is within normal limits  bilaterally. ° °Neurologic  Senn-Weinstein monofilament wire test diminished  bilaterally. Muscle power within normal limits bilaterally. ° °Nails Thick disfigured discolored nails with subungual debris  from hallux to fifth toes bilaterally. No evidence of bacterial infection or drainage bilaterally. ° °Orthopedic  No limitations of motion  feet .  No crepitus or effusions noted.  No bony pathology or digital deformities noted.  HAV  B/L. ° °Skin  normotropic skin with no porokeratosis noted bilaterally.  No signs of infections or ulcers noted.    ° °Onychomycosis  Pain in right toes  Pain in left toes ° °Consent was obtained for treatment procedures.   Mechanical debridement of nails 1-5  bilaterally performed with a nail nipper.  Filed with dremel without incident.  ° ° °Return office visit    10 weeks                  Told patient to return for periodic foot care and evaluation due to potential at risk complications. ° ° °Aireal Slater DPM  °

## 2022-06-04 DIAGNOSIS — E1165 Type 2 diabetes mellitus with hyperglycemia: Secondary | ICD-10-CM | POA: Diagnosis not present

## 2022-06-21 DIAGNOSIS — E11649 Type 2 diabetes mellitus with hypoglycemia without coma: Secondary | ICD-10-CM

## 2022-07-03 ENCOUNTER — Encounter: Payer: Medicare Other | Attending: Endocrinology | Admitting: Registered"

## 2022-07-03 ENCOUNTER — Encounter: Payer: Self-pay | Admitting: Registered"

## 2022-07-03 VITALS — Wt 195.0 lb

## 2022-07-03 DIAGNOSIS — E119 Type 2 diabetes mellitus without complications: Secondary | ICD-10-CM | POA: Diagnosis not present

## 2022-07-03 NOTE — Progress Notes (Signed)
Diabetes Self-Management Education  Visit Type: Follow-up  Appt. Start Time: 1002 Appt. End Time: 1040  07/03/22  [name], identified by name and date of birth, is a 72 y.o. male with a diagnosis of Diabetes: Type 2.   ASSESSMENT  Wt Readings from Last 3 Encounters:  07/03/22 195 lb (88.5 kg)  05/16/22 195 lb (88.5 kg)  02/24/22 197 lb 9.6 oz (89.6 kg)   Lab Results  Component Value Date   HGBA1C 7.0 (H) 05/12/2022      Medication:  Toujeo dose is usually 130 u pre-breakfast  Novolog ~15 min before dinner (Pt thought he was supposed to only take with dinner. Discussed taking with all meals, but may not need if only having protein bar for lunch. Trulicity increased to 3 mg on Sunday mornings. Pt states he is also taking Vit D 5,000 u 2x/week  SMBG: Pt states he sometimes pricks finger at night to check accuracy of Josephine Igo and states has been accurate lately. FBS 65-100; ppbg 80-250 mg/dL.  Diet: Pt reports since his Trulicity dose has increased has reduced appetite at lunch only eats a protein bar. Pt states he usually eats breakfast everyday because his blood sugar is low. States he usually eats 2 eggs, 1 slice bacon, toast. Pt states he eats dinner around 6:30-7 pm. Sometimes has a snack such as fruit in the evening.  Physical activity: Pt states is using his wife's blue resistance band 2x/week, walks 1 mile and still working on increasing distance.      Individualized Plan for Diabetes Self-Management Training:   Learning Objective:  Patient will have a greater understanding of diabetes self-management. Patient education plan is to attend individual and/or group sessions per assessed needs and concerns.  Patient Instructions  To help prevent high blood sugar in the morning, when having pizza try to have earlier in the day (avoid high fat food for dinner). Also have vegetables with it.   Physical activity: using your resistance bands for 15 min 2x/ week.   Continue walking 1 mi 2x/week with wife.  To avoid high blood sugar while fishing, take other snacks that won't raise blood sugar so high. Protein bars, nuts, string cheese, tuna or chicken snack pack, etc, will cause less of a rise than poptarts.   Expected Outcomes:  Demonstrated interest in learning. Expect positive outcomes  Education material provided: none  If problems or questions, patient to contact team via:  Phone and MyChart  Future DSME appointment: 6 weeks

## 2022-07-03 NOTE — Patient Instructions (Addendum)
Please talk to your new endocrinologist about how you take your Novolog.  Let them know that your meal can vary from just a protein bar, to a full meal with starches like bread and potatoes. See if they want you to change how much insulin you are taking depending on the meal.  You are doing fine with your servings of protein in the evening. Great job on including vegetables every day. Be sure to have protein for breakfast. Great job on cutting out poptart and replacing with foods with more nutrition such as nuts, fruits and vegetables!  Increase the numbers of times per week your resistance bands to 3 times per week. Continue walking daily.

## 2022-07-04 DIAGNOSIS — E1165 Type 2 diabetes mellitus with hyperglycemia: Secondary | ICD-10-CM | POA: Diagnosis not present

## 2022-07-13 ENCOUNTER — Other Ambulatory Visit: Payer: Self-pay | Admitting: Endocrinology

## 2022-07-18 ENCOUNTER — Encounter: Payer: Self-pay | Admitting: "Endocrinology

## 2022-07-18 ENCOUNTER — Ambulatory Visit (INDEPENDENT_AMBULATORY_CARE_PROVIDER_SITE_OTHER): Payer: Medicare Other | Admitting: "Endocrinology

## 2022-07-18 ENCOUNTER — Other Ambulatory Visit: Payer: Self-pay | Admitting: "Endocrinology

## 2022-07-18 VITALS — BP 140/65 | HR 90 | Ht 70.0 in | Wt 196.8 lb

## 2022-07-18 DIAGNOSIS — E782 Mixed hyperlipidemia: Secondary | ICD-10-CM | POA: Diagnosis not present

## 2022-07-18 DIAGNOSIS — Z7984 Long term (current) use of oral hypoglycemic drugs: Secondary | ICD-10-CM | POA: Diagnosis not present

## 2022-07-18 DIAGNOSIS — Z794 Long term (current) use of insulin: Secondary | ICD-10-CM | POA: Diagnosis not present

## 2022-07-18 DIAGNOSIS — E11649 Type 2 diabetes mellitus with hypoglycemia without coma: Secondary | ICD-10-CM | POA: Diagnosis not present

## 2022-07-18 MED ORDER — TRULICITY 4.5 MG/0.5ML ~~LOC~~ SOAJ
4.5000 mg | SUBCUTANEOUS | 1 refills | Status: DC
Start: 2022-07-18 — End: 2022-08-01

## 2022-07-18 MED ORDER — SURE COMFORT PEN NEEDLES 32G X 4 MM MISC: 10 refills | Status: DC

## 2022-07-18 MED ORDER — GVOKE HYPOPEN 1-PACK 1 MG/0.2ML ~~LOC~~ SOAJ: 1.0000 mg | SUBCUTANEOUS | 2 refills | Status: DC | PRN

## 2022-07-18 MED ORDER — GVOKE HYPOPEN 1-PACK 1 MG/0.2ML ~~LOC~~ SOAJ: 1.0000 mg | SUBCUTANEOUS | 2 refills | Status: AC | PRN

## 2022-07-18 MED ORDER — EMPAGLIFLOZIN 25 MG PO TABS: 25.0000 mg | ORAL_TABLET | Freq: Every day | ORAL | 0 refills | Status: DC

## 2022-07-18 MED ORDER — TRULICITY 4.5 MG/0.5ML ~~LOC~~ SOAJ: 4.5000 mg | SUBCUTANEOUS | 1 refills | Status: DC

## 2022-07-18 MED ORDER — EMPAGLIFLOZIN 25 MG PO TABS
25.0000 mg | ORAL_TABLET | Freq: Every day | ORAL | 0 refills | Status: DC
Start: 2022-07-18 — End: 2022-08-01

## 2022-07-18 NOTE — Progress Notes (Signed)
Outpatient Endocrinology Note [name], [place]  07/18/22   Anthony Cuevas 05/04/1950 [id number]  Referring Provider: Joaquim Nam, MD Primary Care Provider: Joaquim Nam, MD Reason for consultation: Subjective   Assessment & Plan  Diagnoses and all orders for this visit:  Uncontrolled type 2 diabetes mellitus with hypoglycemia without coma (HCC) -     Insulin Pen Needle (SURE COMFORT PEN NEEDLES) 32G X 4 MM MISC; USE THREE TIMES A DAY  Long-term insulin use (HCC)  Long term (current) use of oral hypoglycemic drugs  Mixed hypercholesterolemia and hypertriglyceridemia  Other orders -     Dulaglutide (TRULICITY) 4.5 MG/0.5ML SOPN; Inject 4.5 mg as directed once a week. -     Glucagon (GVOKE HYPOPEN 1-PACK) 1 MG/0.2ML SOAJ; Inject 1 mg into the skin as needed (low blood sugar with imaopired consciousness). -     empagliflozin (JARDIANCE) 25 MG TABS tablet; Take 1 tablet (25 mg total) by mouth daily before breakfast.    Diabetes Type 2 complicated by neuropathy   Lab Results  Component Value Date   GFR 70.37 05/12/2022   Hba1c goal less than 7, current Hba1c is  Lab Results  Component Value Date   HGBA1C 7.0 (H) 05/12/2022   Will recommend the following: Toujeo 120 units once at night, Novolog insulin 30 units before break fast and lunch and 32 units before dinner tidac 15 min before meals       Jardiance 25 mg every day  Trulicity 4.5 mg weekly  No known contraindications to any of above medications Glucagon ordered with refills on 07/18/22 No history of MEN syndrome/medullary thyroid cancer/pancreatitis or pancreatic cancer in self or family  -Last LD and Tg are as follows: Lab Results  Component Value Date   LDLCALC 14 10/12/2021    Lab Results  Component Value Date   TRIG 189 (H) 10/12/2021   -Recommend rosuvastatin 20 mg QD -Follow low fat diet and exercise   -Blood pressure goal <140/90 - Microalbumin/creatinine goal < 30 -Last MA/Cr  is as follows: Lab Results  Component Value Date   MICROALBUR 4.5 (H) 10/31/2021   - on ACE/ARB lisinopril 40 mg qd -diet changes including salt restriction -limit eating outside -counseled BP targets per standards of diabetes care -uncontrolled blood pressure can lead to retinopathy, nephropathy and cardiovascular and atherosclerotic heart disease  Reviewed and counseled on: -A1C target -Blood sugar targets -Complications of uncontrolled diabetes  -Checking blood sugar before meals and bedtime and bring log next visit -All medications with mechanism of action and side effects -Hypoglycemia management: rule of 15's, Glucagon Emergency Kit and medical alert ID -low-carb low-fat plate-method diet -At least 20 minutes of physical activity per day -Annual dilated retinal eye exam and foot exam -compliance and follow up needs -follow up as scheduled or earlier if problem gets worse  Call if blood sugar is less than 70 or consistently above 250    Take a 15 gm snack of carbohydrate at bedtime before you go to sleep if your blood sugar is less than 100.    If you are going to fast after midnight for a test or procedure, ask your physician for instructions on how to reduce/decrease your insulin dose.    Call if blood sugar is less than 70 or consistently above 250  -Treating a low sugar by rule of 15  (15 gms of sugar every 15 min until sugar is more than 70) If you feel your sugar is low,  test your sugar to be sure If your sugar is low (less than 70), then take 15 grams of a fast acting Carbohydrate (3-4 glucose tablets or glucose gel or 4 ounces of juice or regular soda) Recheck your sugar 15 min after treating low to make sure it is more than 70 If sugar is still less than 70, treat again with 15 grams of carbohydrate          Don't drive the hour of hypoglycemia  If unconscious/unable to eat or drink by mouth, use glucagon injection or nasal spray baqsimi and call 911. Can repeat  again in 15 min if still unconscious.  Return in about 7 weeks (around 09/05/2022).   I have reviewed current medications, nurse's notes, allergies, vital signs, past medical and surgical history, family medical history, and social history for this encounter. Counseled patient on symptoms, examination findings, lab findings, imaging results, treatment decisions and monitoring and prognosis. The patient understood the recommendations and agrees with the treatment plan. All questions regarding treatment plan were fully answered.  Altamese Rives, MD  07/18/22    History of Present Illness Anthony Cuevas is a 72 y.o. year old male who presents for evaluation of Type 2 diabetes mellitus.  Anthony Cuevas was first diagnosed in 34.   Diabetes education +  Home diabetes regimen: Trulicity 1.5 mg weekly  Toujeo 130 units once at night, Novolog insulin 30 units tidac 20 min before meals        Previous history: Non-insulin hypoglycemic drugs previously used: Actos, metformin stopped in 5/23, glipizide Insulin was started in 2006 and has taken Lantus, NovoLog as well as NPH and regular  COMPLICATIONS -  MI/Stroke -  retinopathy +  neuropathy -  nephropathy  BLOOD SUGAR DATA  CGM interpretation: At today's visit, we reviewed her CGM downloads. The full report is scanned in the media. Reviewing the CGM trends, BG are low across the day, with highs around dinner time.    Physical Exam  BP (!) 140/65   Pulse 90   Ht 5\' 10"  (1.778 m)   Wt 196 lb 12.8 oz (89.3 kg)   SpO2 97%   BMI 28.24 kg/m    Constitutional: well developed, well nourished Head: normocephalic, atraumatic Eyes: sclera anicteric, no redness Neck: supple Lungs: normal respiratory effort Neurology: alert and oriented Skin: dry, no appreciable rashes Musculoskeletal: no appreciable defects Psychiatric: normal mood and affect Diabetic Foot Exam - Simple   No data filed      Current Medications Patient's  Medications  New Prescriptions   DULAGLUTIDE (TRULICITY) 4.5 MG/0.5ML SOPN    Inject 4.5 mg as directed once a week.   GLUCAGON (GVOKE HYPOPEN 1-PACK) 1 MG/0.2ML SOAJ    Inject 1 mg into the skin as needed (low blood sugar with imaopired consciousness).  Previous Medications   ASCORBIC ACID (VITAMIN C) 500 MG TABLET    Take 1 tablet (500 mg total) by mouth daily.   CHOLECALCIFEROL (VITAMIN D3) 125 MCG (5000 UT) CAPS    Take 5,000 Units by mouth 2 (two) times a week.   CLOPIDOGREL (PLAVIX) 75 MG TABLET    Take 1 tablet (75 mg total) by mouth daily.   CONTINUOUS GLUCOSE SENSOR (FREESTYLE LIBRE 3 SENSOR) MISC    Place 1 sensor on the skin every 14 days. Use to check glucose continuously   CYANOCOBALAMIN 1000 MCG TABLET    Take 1,000 mcg by mouth daily.   CYCLOBENZAPRINE (FLEXERIL) 10 MG TABLET  TAKE 1 TABLET TWICE A DAY AS NEEDED FOR MUSCLE SPASMS (SEDATION CAUTION)   GLUCOSE BLOOD (ONETOUCH ULTRA) TEST STRIP    USE AS INSTRUCTED TO TEST BLOOD SUGARS 3 OR 4 TIMES DAILY   INSULIN ASPART (NOVOLOG FLEXPEN) 100 UNIT/ML FLEXPEN    INJECT 30 UNITS BEFORE MEALS AS DIRECTED   INSULIN GLARGINE, 2 UNIT DIAL, (TOUJEO MAX SOLOSTAR) 300 UNIT/ML SOLOSTAR PEN    ADMINISTER 100 UNITS ONCE DAILY AND ADJUSTING AS DIRECTED TO MAXIMUM DOSE OF 140 UNITS DAILY   IRON, FERROUS SULFATE, 325 (65 FE) MG TABS    Take on Sunday and Wednesday.  2 tabs per week.   LATANOPROST (XALATAN) 0.005 % OPHTHALMIC SOLUTION    Place 1 drop into both eyes at bedtime.   LISINOPRIL (ZESTRIL) 40 MG TABLET    TAKE 1 TABLET DAILY   OMEGA-3 FATTY ACIDS (FISH OIL PO)    Take 1,000 mg by mouth once a week.   ROSUVASTATIN (CRESTOR) 20 MG TABLET    Take 1 tablet (20 mg total) by mouth daily.   TRAMADOL (ULTRAM) 50 MG TABLET    TAKE 1 TABLET THREE TIMES A DAY AS NEEDED FOR MODERATE PAIN  Modified Medications   Modified Medication Previous Medication   EMPAGLIFLOZIN (JARDIANCE) 25 MG TABS TABLET empagliflozin (JARDIANCE) 25 MG TABS tablet       Take 1 tablet (25 mg total) by mouth daily before breakfast.    Take 1 tablet (25 mg total) by mouth daily before breakfast.   INSULIN PEN NEEDLE (SURE COMFORT PEN NEEDLES) 32G X 4 MM MISC Insulin Pen Needle (SURE COMFORT PEN NEEDLES) 32G X 4 MM MISC      USE THREE TIMES A DAY    USE THREE TIMES A DAY  Discontinued Medications   DULAGLUTIDE (TRULICITY) 3 MG/0.5ML SOPN    Inject 3 mg into the skin once a week.    Allergies No Known Allergies  Past Medical History Past Medical History:  Diagnosis Date   Arthritis    Coronary atherosclerosis    a. 07/2018 CT Chest: 3 vessel coronary atherosclerosis; b. 07/2018 MV: EF 46% (GI uptake artifact), No ischemia/infarct. Mild to mod diffuse Ao atherosclerosis and at least moderate 2 vessel CAD (LAD/RCA) on CT imaging. Low risk.   Depression    improved on sertraline   Diabetes mellitus, type 2 (HCC) 09/1996   GERD (gastroesophageal reflux disease) 1990   History of peptic ulcer disease    Hyperlipidemia 1992   Hypertension 05/2003   Lumbar stenosis L2-3   Muscle atrophy    in arms from degenerative changes in neck   Neck pain    chronic   Smoker    Wears glasses     Past Surgical History Past Surgical History:  Procedure Laterality Date   ABDOMINAL AORTOGRAM W/LOWER EXTREMITY N/A 09/03/2019   Procedure: ABDOMINAL AORTOGRAM W/LOWER EXTREMITY;  Surgeon: Iran Ouch, MD;  Location: [facility];  Service: Cardiovascular;  Laterality: N/A;  Limited Study   CERVICAL DISC SURGERY  04/19/2002   fusion, Dr. Ophelia Charter   CERVICAL DISCECTOMY  01/31/1995   partial   CERVICAL DISCECTOMY  01/30/1998   COLONOSCOPY WITH ESOPHAGOGASTRODUODENOSCOPY (EGD)  06/25/2015   ENTEROSCOPY N/A 07/04/2021   Procedure: ENTEROSCOPY;  Surgeon: Sherrilyn Rist, MD;  Location: [facility];  Service: Gastroenterology;  Laterality: N/A;   HEMORRHOID SURGERY  01/31/1987   HOT HEMOSTASIS N/A 07/04/2021   Procedure: HOT HEMOSTASIS (ARGON PLASMA COAGULATION/BICAP);   Surgeon: Amada Jupiter  L III, MD;  Location: [facility];  Service: Gastroenterology;  Laterality: N/A;   KNEE ARTHROPLASTY  11/20/2011   Procedure: COMPUTER ASSISTED TOTAL KNEE ARTHROPLASTY;  Surgeon: Eldred Manges, MD;  Location: [facility];  Service: Orthopedics;  Laterality: Left;  Conversion Left Knee Medial Uni to Total Knee Arthroplasty-Cemented   LUMBAR LAMINECTOMY/DECOMPRESSION MICRODISCECTOMY N/A 04/28/2015   Procedure: L2-3 Decompression;  Surgeon: Eldred Manges, MD;  Location: [facility];  Service: Orthopedics;  Laterality: N/A;   MR MRA DUPLICATE EXAM  INACTIVATE     MULTIPLE TOOTH EXTRACTIONS     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 Samuella Cota   STOMACH SURGERY  01/30/1982   [facility], [facility]   SUBMUCOSAL TATTOO INJECTION  07/04/2021   Procedure: SUBMUCOSAL TATTOO INJECTION;  Surgeon: Sherrilyn Rist, MD;  Location: [facility];  Service: Gastroenterology;;    Family History family history includes Breast cancer in his maternal grandmother; Diabetes in his brother, brother, father, mother, and sister; Heart disease in his brother and son; Hypertension in his mother; Lung disease in his paternal grandfather; Stroke in his mother.  Social History Social History   Socioeconomic History   Marital status: Married    Spouse name: Not on file   Number of children: 3   Years of education: Not on file   Highest education level: Not on file  Occupational History   Occupation: Ship broker, retired    Comment: Firefighter  Tobacco Use   Smoking status: Former    Packs/day: 1.00    Years: 41.00    Additional pack years: 0.00    Total pack years: 41.00    Types: Cigarettes    Quit date: 2014    Years  since quitting: 10.4   Smokeless tobacco: Never  Vaping Use   Vaping Use: Never used  Substance and Sexual Activity   Alcohol use: No    Alcohol/week: 0.0 standard drinks of alcohol   Drug use: Never   Sexual activity: Not Currently  Other Topics Concern   Not on file  Social History Narrative   Retired Photographer, [employer], aviation fuel, [phone], no known agent orange exposure but 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   Married 1975   3 kids   Social Determinants of Health   Financial Resource Strain: Low Risk  (12/30/2021)   Overall Financial Resource Strain (CARDIA)    Difficulty of Paying Living Expenses: Not very hard  Food Insecurity: No Food Insecurity (12/30/2021)   Hunger Vital Sign    Worried About Running Out of Food in the Last Year: Never true    Ran Out of Food in the Last Year: Never true  Transportation Needs: No Transportation Needs (12/30/2021)   PRAPARE - Administrator, Civil Service (Medical): No    Lack of Transportation (Non-Medical): No  Physical Activity: Insufficiently Active (12/30/2021)   Exercise Vital Sign    Days of Exercise per Week: 2 days    Minutes of Exercise per Session: 20 min  Stress: No Stress Concern Present (12/30/2021)   [employer] of Occupational Health - Occupational Stress Questionnaire    Feeling of Stress : Not at all  Social Connections:  Unknown (12/30/2021)   Social Connection and Isolation Panel [NHANES]    Frequency of Communication with Friends and Family: Once a week    Frequency of Social Gatherings with Friends and Family: Twice a week    Attends Religious Services: Not on file    Active Member of Clubs or Organizations: No    Attends Banker Meetings: Never    Marital Status: Married  Catering manager Violence: Not At Risk (12/30/2021)   Humiliation, Afraid, Rape, and Kick questionnaire    Fear of Current or Ex-Partner: No    Emotionally Abused: No    Physically Abused: No    Sexually  Abused: No    Lab Results  Component Value Date   HGBA1C 7.0 (H) 05/12/2022   HGBA1C 7.1 (H) 02/07/2022   HGBA1C 7.0 (A) 08/22/2021   Lab Results  Component Value Date   CHOL 82 10/12/2021   Lab Results  Component Value Date   HDL 30 (L) 10/12/2021   Lab Results  Component Value Date   LDLCALC 14 10/12/2021   Lab Results  Component Value Date   TRIG 189 (H) 10/12/2021   Lab Results  Component Value Date   CHOLHDL 2.7 10/12/2021   Lab Results  Component Value Date   CREATININE 1.07 05/12/2022   Lab Results  Component Value Date   GFR 70.37 05/12/2022   Lab Results  Component Value Date   MICROALBUR 4.5 (H) 10/31/2021      Component Value Date/Time   NA 142 05/12/2022 1051   NA 142 08/14/2019 1136   K 4.6 05/12/2022 1051   CL 105 05/12/2022 1051   CO2 25 05/12/2022 1051   GLUCOSE 114 (H) 05/12/2022 1051   BUN 20 05/12/2022 1051   BUN 31 (H) 08/14/2019 1136   CREATININE 1.07 05/12/2022 1051   CALCIUM 9.6 05/12/2022 1051   PROT 7.1 10/12/2021 0934   PROT 7.2 09/28/2020 1452   ALBUMIN 4.0 10/12/2021 0934   ALBUMIN 4.1 09/28/2020 1452   AST 31 10/12/2021 0934   ALT 30 10/12/2021 0934   ALKPHOS 56 10/12/2021 0934   BILITOT 0.4 10/12/2021 0934   BILITOT 0.3 09/28/2020 1452   GFRNONAA 63 08/14/2019 1136   GFRAA 73 08/14/2019 1136      Latest Ref Rng & Units 05/12/2022   10:51 AM 02/07/2022    8:26 AM 10/31/2021    1:10 PM  BMP  Glucose 70 - 99 mg/dL 188  86  416   BUN 6 - 23 mg/dL 20  9  11    Creatinine 0.40 - 1.50 mg/dL 6.06  3.01  6.01   Sodium 135 - 145 mEq/L 142  144  139   Potassium 3.5 - 5.1 mEq/L 4.6  4.8  4.7   Chloride 96 - 112 mEq/L 105  103  104   CO2 19 - 32 mEq/L 25  31  30    Calcium 8.4 - 10.5 mg/dL 9.6  09.3  9.7        Component Value Date/Time   WBC 6.2 05/12/2022 1054   RBC 4.85 05/12/2022 1054   HGB 15.8 05/12/2022 1054   HGB 13.6 08/14/2019 1136   HCT 46.7 05/12/2022 1054   HCT 39.4 08/14/2019 1136   PLT 185.0 05/12/2022  1054   PLT 148 (L) 08/14/2019 1136   MCV 96.2 05/12/2022 1054   MCV 98 (H) 08/14/2019 1136   MCH 33.8 (H) 08/14/2019 1136   MCH 21.8 (L) 04/27/2015 1016   MCHC 33.8  05/12/2022 1054   RDW 12.9 05/12/2022 1054   RDW 12.4 08/14/2019 1136   LYMPHSABS 1.3 05/12/2022 1054   MONOABS 0.8 05/12/2022 1054   EOSABS 0.1 05/12/2022 1054   BASOSABS 0.0 05/12/2022 1054     Parts of this note may have been dictated using voice recognition software. There may be variances in spelling and vocabulary which are unintentional. Not all errors are proofread. Please notify the Thereasa Parkin if any discrepancies are noted or if the meaning of any statement is not clear.

## 2022-07-18 NOTE — Patient Instructions (Addendum)
Toujeo 120 units once at night Novolog insulin 30 units before break fast and lunch and 32 units before dinner 15 min before meals       Jardiance 25 mg every day  Trulicity 4.5 mg weekly  _______________ Goals of DM therapy:  Morning Fasting blood sugar: 80-140  Blood sugar before meals: 80-140 Bed time blood sugar: 100-150  A1C <7%, limited only by hypoglycemia  1.Diabetes medications and their side effects discussed, including hypoglycemia    2. Check blood glucose:  a) Always check blood sugars before driving. Please see below (under hypoglycemia) on how to manage b) Check a minimum of 3 times/day or more as needed when having symptoms of hypoglycemia.   c) Try to check blood glucose before sleeping/in the middle of the night to ensure that it is remaining stable and not dropping less than 100 d) Check blood glucose more often if sick  3. Diet: a) 3 meals per day schedule b: Restrict carbs to 60-70 grams (4 servings) per meal c) Colorful vegetables - 3 servings a day, and low sugar fruit 2 servings/day Plate control method: 1/4 plate protein, 1/4 starch, 1/2 green, yellow, or red vegetables d) Avoid carbohydrate snacks unless hypoglycemic episode, or increased physical activity  4. Regular exercise as tolerated, preferably 3 or more hours a week  5. Hypoglycemia: a)  Do not drive or operate machinery without first testing blood glucose to assure it is over 90 mg%, or if dizzy, lightheaded, not feeling normal, etc, or  if foot or leg is numb or weak. b)  If blood glucose less than 70, take four 5gm Glucose tabs or 15-30 gm Glucose gel.  Repeat every 15 min as needed until blood sugar is >100 mg/dl. If hypoglycemia persists then call 911.   6. Sick day management: a) Check blood glucose more often b) Continue usual therapy if blood sugars are elevated.   7. Contact the doctor immediately if blood glucose is frequently <60 mg/dl, or an episode of severe hypoglycemia occurs  (where someone had to give you glucose/  glucagon or if you passed out from a low blood glucose), or if blood glucose is persistently >350 mg/dl, for further management  8. A change in level of physical activity or exercise and a change in diet may also affect your blood sugar. Check blood sugars more often and call if needed.  Instructions: 1. Bring glucose meter, blood glucose records on every visit for review 2. Continue to follow up with primary care physician and other providers for medical care 3. Yearly eye  and foot exam 4. Please get blood work done prior to the next appointment

## 2022-07-19 ENCOUNTER — Encounter: Payer: Self-pay | Admitting: "Endocrinology

## 2022-08-01 ENCOUNTER — Telehealth: Payer: Self-pay

## 2022-08-01 ENCOUNTER — Other Ambulatory Visit: Payer: Self-pay

## 2022-08-01 ENCOUNTER — Other Ambulatory Visit: Payer: Self-pay | Admitting: Endocrinology

## 2022-08-01 DIAGNOSIS — E11649 Type 2 diabetes mellitus with hypoglycemia without coma: Secondary | ICD-10-CM

## 2022-08-01 MED ORDER — TRULICITY 4.5 MG/0.5ML ~~LOC~~ SOAJ
4.5000 mg | SUBCUTANEOUS | 1 refills | Status: DC
Start: 2022-08-01 — End: 2022-08-07
  Filled 2022-08-01: qty 2, 28d supply, fill #0

## 2022-08-01 NOTE — Telephone Encounter (Signed)
Anthony Cuevas [name], CMA  ?

## 2022-08-03 DIAGNOSIS — E1165 Type 2 diabetes mellitus with hyperglycemia: Secondary | ICD-10-CM | POA: Diagnosis not present

## 2022-08-07 ENCOUNTER — Telehealth: Payer: Self-pay

## 2022-08-07 DIAGNOSIS — E11649 Type 2 diabetes mellitus with hypoglycemia without coma: Secondary | ICD-10-CM

## 2022-08-07 MED ORDER — TRULICITY 4.5 MG/0.5ML ~~LOC~~ SOAJ
4.5000 mg | SUBCUTANEOUS | 1 refills | Status: DC
Start: 2022-08-07 — End: 2023-03-01

## 2022-08-07 MED ORDER — TRULICITY 4.5 MG/0.5ML ~~LOC~~ SOAJ
4.5000 mg | SUBCUTANEOUS | 3 refills | Status: DC
Start: 2022-08-07 — End: 2022-08-07

## 2022-08-07 NOTE — Telephone Encounter (Signed)
[name], CMA  ?

## 2022-08-07 NOTE — Addendum Note (Signed)
Addended byAltamese Galax on: 08/07/2022 02:41 PM   Modules accepted: Orders

## 2022-08-07 NOTE — Telephone Encounter (Signed)
Numan Zylstra Ann Ibeth Fahmy, CMA  ?

## 2022-08-14 ENCOUNTER — Encounter: Payer: Self-pay | Admitting: Registered"

## 2022-08-14 ENCOUNTER — Encounter: Payer: Medicare Other | Attending: Endocrinology | Admitting: Registered"

## 2022-08-14 ENCOUNTER — Other Ambulatory Visit (INDEPENDENT_AMBULATORY_CARE_PROVIDER_SITE_OTHER): Payer: Medicare Other

## 2022-08-14 VITALS — Wt 198.1 lb

## 2022-08-14 DIAGNOSIS — Z794 Long term (current) use of insulin: Secondary | ICD-10-CM

## 2022-08-14 DIAGNOSIS — E1165 Type 2 diabetes mellitus with hyperglycemia: Secondary | ICD-10-CM

## 2022-08-14 DIAGNOSIS — E119 Type 2 diabetes mellitus without complications: Secondary | ICD-10-CM | POA: Diagnosis not present

## 2022-08-14 LAB — HEMOGLOBIN A1C: Hgb A1c MFr Bld: 6.3 % (ref 4.6–6.5)

## 2022-08-14 LAB — BASIC METABOLIC PANEL
BUN: 16 mg/dL (ref 6–23)
CO2: 27 mEq/L (ref 19–32)
Calcium: 9.6 mg/dL (ref 8.4–10.5)
Chloride: 104 mEq/L (ref 96–112)
Creatinine, Ser: 1.05 mg/dL (ref 0.40–1.50)
GFR: 71.85 mL/min (ref >60.00)
Glucose, Bld: 54 mg/dL — ABNORMAL LOW (ref 70–99)
Potassium: 4.2 mEq/L (ref 3.5–5.1)
Sodium: 139 mEq/L (ref 135–145)

## 2022-08-14 NOTE — Patient Instructions (Addendum)
Good job on some of the changes made Avoiding skipping meals, and having snacks as you are hungry.  Continue keeping physical active: Consider getting into a routine (every other day) using your resistance bands for 15 min.  Continue walking 1 mi 2x/week with wife. Walking when going out to fish  To be prepared for low blood sugar, instead of packing a honey bun for your fishing trip, consider just having glucose tablets or a few hard candies.

## 2022-08-14 NOTE — Progress Notes (Signed)
Diabetes Self-Management Education  Visit Type: Follow-up  Appt. Start Time: 1130 Appt. End Time: 1158  08/14/22  [name], identified by name and date of birth, is a 72 y.o. male with a diagnosis of Diabetes: Type 2.   Pt arrived for visit after having a fasting blood draw, pt blood sugar was 69-73 steady, was having hypoglycemic sxs, provided apple juice.   ASSESSMENT  Wt Readings from Last 3 Encounters:  07/03/22 195 lb (88.5 kg)  05/16/22 195 lb (88.5 kg)  02/24/22 197 lb 9.6 oz (89.6 kg)   Lab Results  Component Value Date   HGBA1C 7.0 (H) 05/12/2022       Medication:  Toujeo dose is usually ~100 u night  Novolog ~30 units ~15 min before eating Trulicity increased to 4.5 mg on Sunday mornings.  Pt states he is also taking Vit D 5,000 u 2x/week  SMBG:Libre CGM FBS 69-103; ppbg 140-251 mg/dL.  Pt states he tries to keep blood sugar ~140 before bed because he wants  to keep it from going too low during the night. Pt changed low alarm to 69 mg/dL and if alarm goes off he will eat one peppermint to bring it up.   Diet:  Pt reports he is getting better with getting protein with meals daily. They are planning meals with protein, vegetable and not much bread, not much red meat. Pt states his wife is keeping quick and simple meals around such as vegetable soup and pot pies. Pt states he goes through a lot of water (3 bottles) also drinks diet soda.  Pt reports he and his brother-in-law take out food when they go fishing, including nabs, bologna & cheese sandwich on whole wheat bread, pt states usually doesn't eat honey bun his wife packs it just in case blood sugar goes low.  Pt states they threw out the poptarts since they were raising his blood sugar too high.  Physical activity: walking 1 mile daily, randomly using resistance bands.      Individualized Plan for Diabetes Self-Management Training:   Learning Objective:  Patient will have a greater understanding  of diabetes self-management. Patient education plan is to attend individual and/or group sessions per assessed needs and concerns.  Patient Instructions  Good job on some of the changes made Avoiding skipping meals, and having snacks as you are hungry.  Continue keeping physical active: Consider getting into a routine (every other day) using your resistance bands for 15 min.  Continue walking 1 mi 2x/week with wife. Walking when going out to fish  To be prepared for low blood sugar, instead of packing a honey bun for your fishing trip, consider just having glucose tablets or a few hard candies.  Expected Outcomes:  Demonstrated interest in learning. Expect positive outcomes  Education material provided: none  If problems or questions, patient to contact team via:  Phone and MyChart  Future DSME appointment: prn

## 2022-08-22 ENCOUNTER — Other Ambulatory Visit: Payer: Self-pay | Admitting: Cardiovascular Disease

## 2022-08-22 ENCOUNTER — Other Ambulatory Visit: Payer: Self-pay | Admitting: Family Medicine

## 2022-08-22 DIAGNOSIS — E785 Hyperlipidemia, unspecified: Secondary | ICD-10-CM

## 2022-08-22 NOTE — Telephone Encounter (Signed)
Refill request for  TRAMADOL HCL TABS 50MG    LOV - 02/14/22 Next OV - not scheduled Last refill - 05/28/22 #135/1

## 2022-08-23 NOTE — Telephone Encounter (Signed)
Sent. Thanks.   

## 2022-08-24 ENCOUNTER — Other Ambulatory Visit: Payer: Self-pay | Admitting: *Deleted

## 2022-08-24 DIAGNOSIS — I739 Peripheral vascular disease, unspecified: Secondary | ICD-10-CM

## 2022-08-31 ENCOUNTER — Ambulatory Visit: Payer: Medicare Other | Admitting: Podiatry

## 2022-09-02 DIAGNOSIS — E1165 Type 2 diabetes mellitus with hyperglycemia: Secondary | ICD-10-CM | POA: Diagnosis not present

## 2022-09-06 ENCOUNTER — Encounter: Payer: Self-pay | Admitting: "Endocrinology

## 2022-09-06 ENCOUNTER — Ambulatory Visit (INDEPENDENT_AMBULATORY_CARE_PROVIDER_SITE_OTHER): Payer: Medicare Other | Admitting: "Endocrinology

## 2022-09-06 VITALS — BP 115/65 | HR 88 | Ht 70.0 in | Wt 194.6 lb

## 2022-09-06 DIAGNOSIS — E782 Mixed hyperlipidemia: Secondary | ICD-10-CM

## 2022-09-06 DIAGNOSIS — Z794 Long term (current) use of insulin: Secondary | ICD-10-CM | POA: Diagnosis not present

## 2022-09-06 DIAGNOSIS — Z7984 Long term (current) use of oral hypoglycemic drugs: Secondary | ICD-10-CM | POA: Diagnosis not present

## 2022-09-06 DIAGNOSIS — E11649 Type 2 diabetes mellitus with hypoglycemia without coma: Secondary | ICD-10-CM | POA: Diagnosis not present

## 2022-09-06 NOTE — Patient Instructions (Signed)
Toujeo 90-95 units once at night Novolog insulin 30 units before break fast and lunch and 32 units before dinner tidac 15 min before meals       Jardiance 25 mg every day  Trulicity 4.5 mg weekly  __________   Goals of DM therapy:  Morning Fasting blood sugar: 80-140  Blood sugar before meals: 80-140 Bed time blood sugar: 100-150  A1C <7%, limited only by hypoglycemia  1.Diabetes medications and their side effects discussed, including hypoglycemia    2. Check blood glucose:  a) Always check blood sugars before driving. Please see below (under hypoglycemia) on how to manage b) Check a minimum of 3 times/day or more as needed when having symptoms of hypoglycemia.   c) Try to check blood glucose before sleeping/in the middle of the night to ensure that it is remaining stable and not dropping less than 100 d) Check blood glucose more often if sick  3. Diet: a) 3 meals per day schedule b: Restrict carbs to 60-70 grams (4 servings) per meal c) Colorful vegetables - 3 servings a day, and low sugar fruit 2 servings/day Plate control method: 1/4 plate protein, 1/4 starch, 1/2 green, yellow, or red vegetables d) Avoid carbohydrate snacks unless hypoglycemic episode, or increased physical activity  4. Regular exercise as tolerated, preferably 3 or more hours a week  5. Hypoglycemia: a)  Do not drive or operate machinery without first testing blood glucose to assure it is over 90 mg%, or if dizzy, lightheaded, not feeling normal, etc, or  if foot or leg is numb or weak. b)  If blood glucose less than 70, take four 5gm Glucose tabs or 15-30 gm Glucose gel.  Repeat every 15 min as needed until blood sugar is >100 mg/dl. If hypoglycemia persists then call 911.   6. Sick day management: a) Check blood glucose more often b) Continue usual therapy if blood sugars are elevated.   7. Contact the doctor immediately if blood glucose is frequently <60 mg/dl, or an episode of severe hypoglycemia  occurs (where someone had to give you glucose/  glucagon or if you passed out from a low blood glucose), or if blood glucose is persistently >350 mg/dl, for further management  8. A change in level of physical activity or exercise and a change in diet may also affect your blood sugar. Check blood sugars more often and call if needed.  Instructions: 1. Bring glucose meter, blood glucose records on every visit for review 2. Continue to follow up with primary care physician and other providers for medical care 3. Yearly eye  and foot exam 4. Please get blood work done prior to the next appointment

## 2022-09-06 NOTE — Progress Notes (Signed)
Outpatient Endocrinology Note Anthony Cuevas , MD  09/06/22   Anthony Cuevas [birth year] [id number]  Referring Provider: Joaquim Nam, MD Primary Care Provider: Joaquim Nam, MD Reason for consultation: Subjective   Assessment & Plan  Diagnoses and all orders for this visit:  Uncontrolled type 2 diabetes mellitus with hypoglycemia without coma (HCC) -     Comprehensive metabolic panel; Future -     Lipid panel; Future -     Microalbumin / creatinine urine ratio; Future  Long-term insulin use (HCC)  Long term (current) use of oral hypoglycemic drugs  Mixed hypercholesterolemia and hypertriglyceridemia   Diabetes Type 2 complicated by neuropathy   Lab Results  Component Value Date   GFR 71.85 08/14/2022   Hba1c goal less than 7, current Hba1c is  Lab Results  Component Value Date   HGBA1C 6.3 08/14/2022   Will recommend the following: Toujeo 90-95 units once at night Novolog insulin 30 units before break fast and lunch and 32 units before dinner tidac 15 min before meals       Jardiance 25 mg every day  Trulicity 4.5 mg weekly  No known contraindications/side effects to any of above medications Glucagon ordered with refills on 07/18/22 No history of MEN syndrome/medullary thyroid cancer/pancreatitis or pancreatic cancer in self or family  -Last LD and Tg are as follows: Lab Results  Component Value Date   LDLCALC 14 10/12/2021    Lab Results  Component Value Date   TRIG 189 (H) 10/12/2021   -Recommend rosuvastatin 20 mg QD -Follow low fat diet and exercise   -Blood pressure goal <140/90 - Microalbumin/creatinine goal < 30 -Last MA/Cr is as follows: Lab Results  Component Value Date   MICROALBUR 4.5 (H) 10/31/2021   - on ACE/ARB lisinopril 40 mg qd -diet changes including salt restriction -limit eating outside -counseled BP targets per standards of diabetes care -uncontrolled blood pressure can lead to retinopathy, nephropathy and  cardiovascular and atherosclerotic heart disease  Reviewed and counseled on: -A1C target -Blood sugar targets -Complications of uncontrolled diabetes  -Checking blood sugar before meals and bedtime and bring log next visit -All medications with mechanism of action and side effects -Hypoglycemia management: rule of 15's, Glucagon Emergency Kit and medical alert ID -low-carb low-fat plate-method diet -At least 20 minutes of physical activity per day -Annual dilated retinal eye exam and foot exam -compliance and follow up needs -follow up as scheduled or earlier if problem gets worse  Call if blood sugar is less than 70 or consistently above 250    Take a 15 gm snack of carbohydrate at bedtime before you go to sleep if your blood sugar is less than 100.    If you are going to fast after midnight for a test or procedure, ask your physician for instructions on how to reduce/decrease your insulin dose.    Call if blood sugar is less than 70 or consistently above 250  -Treating a low sugar by rule of 15  (15 gms of sugar every 15 min until sugar is more than 70) If you feel your sugar is low, test your sugar to be sure If your sugar is low (less than 70), then take 15 grams of a fast acting Carbohydrate (3-4 glucose tablets or glucose gel or 4 ounces of juice or regular soda) Recheck your sugar 15 min after treating low to make sure it is more than 70 If sugar is still less than 70, treat again with 15  grams of carbohydrate          Don't drive the hour of hypoglycemia  If unconscious/unable to eat or drink by mouth, use glucagon injection or nasal spray baqsimi and call 911. Can repeat again in 15 min if still unconscious.  Return in about 8 weeks (around 11/01/2022) for visit and 8 am labs before next visit.   I have reviewed current medications, nurse's notes, allergies, vital signs, past medical and surgical history, family medical history, and social history for this encounter.  Counseled patient on symptoms, examination findings, lab findings, imaging results, treatment decisions and monitoring and prognosis. The patient understood the recommendations and agrees with the treatment plan. All questions regarding treatment plan were fully answered.  Anthony Cuevas Pisek, MD  09/06/22    History of Present Illness Anthony Cuevas is a 72 y.o. year old male who presents for evaluation of Type 2 diabetes mellitus.  Anthony Cuevas was first diagnosed in 59.   Diabetes education +  Home diabetes regimen: Toujeo 100 units once at night, Novolog insulin 30 units before break fast and lunch and 32 units before dinner tidac 15 min before meals       Jardiance 25 mg every day  Trulicity 4.5 mg weekly    Previous history: Non-insulin hypoglycemic drugs previously used: Actos, metformin stopped in 5/23, glipizide Insulin was started in 2006 and has taken Lantus, NovoLog as well as NPH and regular  COMPLICATIONS -  MI/Stroke -  retinopathy +  neuropathy -  nephropathy  BLOOD SUGAR DATA  CGM interpretation: At today's visit, we reviewed her CGM downloads. The full report is scanned in the media. Reviewing the CGM trends, BG are low across the day, with highs around dinner time.    Physical Exam  BP 115/65   Pulse 88   Ht 5\' 10"  (1.778 m)   Wt 194 lb 9.6 oz (88.3 kg)   SpO2 99%   BMI 27.92 kg/m    Constitutional: well developed, well nourished Head: normocephalic, atraumatic Eyes: sclera anicteric, no redness Neck: supple Lungs: normal respiratory effort Neurology: alert and oriented Skin: dry, no appreciable rashes Musculoskeletal: no appreciable defects Psychiatric: normal mood and affect Diabetic Foot Exam - Simple   No data filed      Current Medications Patient's Medications  New Prescriptions   No medications on file  Previous Medications   ASCORBIC ACID (VITAMIN C) 500 MG TABLET    Take 1 tablet (500 mg total) by mouth daily.    CHOLECALCIFEROL (VITAMIN D3) 125 MCG (5000 UT) CAPS    Take 5,000 Units by mouth 2 (two) times a week.   CLOPIDOGREL (PLAVIX) 75 MG TABLET    Take 1 tablet (75 mg total) by mouth daily.   CONTINUOUS GLUCOSE SENSOR (FREESTYLE LIBRE 3 SENSOR) MISC    Place 1 sensor on the skin every 14 days. Use to check glucose continuously   CYANOCOBALAMIN 1000 MCG TABLET    Take 1,000 mcg by mouth daily.   CYCLOBENZAPRINE (FLEXERIL) 10 MG TABLET    TAKE 1 TABLET TWICE A DAY AS NEEDED FOR MUSCLE SPASMS (SEDATION CAUTION)   DULAGLUTIDE (TRULICITY) 4.5 MG/0.5ML SOPN    Inject 4.5 mg as directed once a week.   GLUCAGON (GVOKE HYPOPEN 1-PACK) 1 MG/0.2ML SOAJ    Inject 1 mg into the skin as needed (low blood sugar with imaopired consciousness).   GLUCOSE BLOOD (ONETOUCH ULTRA) TEST STRIP    USE AS INSTRUCTED TO TEST BLOOD SUGARS 3  OR 4 TIMES DAILY   INSULIN ASPART (NOVOLOG FLEXPEN) 100 UNIT/ML FLEXPEN    INJECT 30 UNITS BEFORE MEALS AS DIRECTED   INSULIN GLARGINE, 2 UNIT DIAL, (TOUJEO MAX SOLOSTAR) 300 UNIT/ML SOLOSTAR PEN    ADMINISTER 100 UNITS ONCE DAILY AND ADJUSTING AS DIRECTED TO MAXIMUM DOSE OF 140 UNITS DAILY   INSULIN PEN NEEDLE (SURE COMFORT PEN NEEDLES) 32G X 4 MM MISC    USE THREE TIMES A DAY   IRON, FERROUS SULFATE, 325 (65 FE) MG TABS    Take on Sunday and Wednesday.  2 tabs per week.   JARDIANCE 25 MG TABS TABLET    TAKE 1 TABLET DAILY BEFORE BREAKFAST   LATANOPROST (XALATAN) 0.005 % OPHTHALMIC SOLUTION    Place 1 drop into both eyes at bedtime.   LISINOPRIL (ZESTRIL) 40 MG TABLET    TAKE 1 TABLET DAILY   OMEGA-3 FATTY ACIDS (FISH OIL PO)    Take 1,000 mg by mouth once a week.   ROSUVASTATIN (CRESTOR) 20 MG TABLET    TAKE 1 TABLET DAILY   TRAMADOL (ULTRAM) 50 MG TABLET    TAKE 1 TABLET THREE TIMES A DAY AS NEEDED FOR MODERATE PAIN  Modified Medications   No medications on file  Discontinued Medications   No medications on file    Allergies No Known Allergies  Past Medical History Past Medical  History:  Diagnosis Date   Arthritis    Coronary atherosclerosis    a. 07/2018 CT Chest: 3 vessel coronary atherosclerosis; b. 07/2018 MV: EF 46% (GI uptake artifact), No ischemia/infarct. Mild to mod diffuse Ao atherosclerosis and at least moderate 2 vessel CAD (LAD/RCA) on CT imaging. Low risk.   Depression    improved on sertraline   Diabetes mellitus, type 2 (HCC) 09/1996   GERD (gastroesophageal reflux disease) 1990   History of peptic ulcer disease    Hyperlipidemia 1992   Hypertension 05/2003   Lumbar stenosis L2-3   Muscle atrophy    in arms from degenerative changes in neck   Neck pain    chronic   Smoker    Wears glasses     Past Surgical History Past Surgical History:  Procedure Laterality Date   ABDOMINAL AORTOGRAM W/LOWER EXTREMITY N/A 09/03/2019   Procedure: ABDOMINAL AORTOGRAM W/LOWER EXTREMITY;  Surgeon: Iran Ouch, MD;  Location: [facility];  Service: Cardiovascular;  Laterality: N/A;  Limited Study   CERVICAL DISC SURGERY  04/19/2002   fusion, Dr. Ophelia Charter   CERVICAL DISCECTOMY  01/31/1995   partial   CERVICAL DISCECTOMY  01/30/1998   COLONOSCOPY WITH ESOPHAGOGASTRODUODENOSCOPY (EGD)  06/25/2015   ENTEROSCOPY N/A 07/04/2021   Procedure: ENTEROSCOPY;  Surgeon: Sherrilyn Rist, MD;  Location: [facility];  Service: Gastroenterology;  Laterality: N/A;   HEMORRHOID SURGERY  01/31/1987   HOT HEMOSTASIS N/A 07/04/2021   Procedure: HOT HEMOSTASIS (ARGON PLASMA COAGULATION/BICAP);  Surgeon: Sherrilyn Rist, MD;  Location: [facility];  Service: Gastroenterology;  Laterality: N/A;   KNEE ARTHROPLASTY  11/20/2011   Procedure: COMPUTER ASSISTED TOTAL KNEE ARTHROPLASTY;  Surgeon: Eldred Manges, MD;  Location: [facility];  Service: Orthopedics;  Laterality: Left;  Conversion Left Knee Medial Uni to Total Knee Arthroplasty-Cemented   LUMBAR LAMINECTOMY/DECOMPRESSION MICRODISCECTOMY N/A 04/28/2015   Procedure: L2-3 Decompression;  Surgeon: Eldred Manges, MD;  [facility];  Service: Orthopedics;  Laterality: N/A;   MR MRA DUPLICATE EXAM  INACTIVATE     MULTIPLE TOOTH EXTRACTIONS  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 Samuella Cota   STOMACH SURGERY  01/30/1982   [facility], [facility]   SUBMUCOSAL TATTOO INJECTION  07/04/2021   Procedure: SUBMUCOSAL TATTOO INJECTION;  Surgeon: Sherrilyn Rist, MD;  Location: [facility];  Service: Gastroenterology;;    Family History family history includes Breast cancer in his maternal grandmother; Diabetes in his brother, brother, father, mother, and sister; Heart disease in his brother and son; Hypertension in his mother; Lung disease in his paternal grandfather; Stroke in his mother.  Social History Social History   Socioeconomic History   Marital status: Married    Spouse name: Not on file   Number of children: 3   Years of education: Not on file   Highest education level: Not on file  Occupational History   Occupation: Ship broker, retired    Comment: Firefighter  Tobacco Use   Smoking status: Former    Current packs/day: 0.00    Average packs/day: 1 pack/day for 41.0 years (41.0 ttl pk-yrs)    Types: Cigarettes    Start date: 37    Quit date: 2014    Years since quitting: 10.6   Smokeless tobacco: Never  Vaping Use   Vaping status: Never Used  Substance and Sexual Activity   Alcohol use: No    Alcohol/week: 0.0 standard drinks of alcohol   Drug use: Never   Sexual activity: Not Currently  Other Topics Concern   Not on file  Social History Narrative   Retired Photographer, [employer], aviation fuel, [phone], no known agent orange exposure but 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   Married 1975   3 kids    Social Determinants of Health   Financial Resource Strain: Low Risk  (12/30/2021)   Overall Financial Resource Strain (CARDIA)    Difficulty of Paying Living Expenses: Not very hard  Food Insecurity: No Food Insecurity (12/30/2021)   Hunger Vital Sign    Worried About Running Out of Food in the Last Year: Never true    Ran Out of Food in the Last Year: Never true  Transportation Needs: No Transportation Needs (12/30/2021)   PRAPARE - Administrator, Civil Service (Medical): No    Lack of Transportation (Non-Medical): No  Physical Activity: Insufficiently Active (12/30/2021)   Exercise Vital Sign    Days of Exercise per Week: 2 days    Minutes of Exercise per Session: 20 min  Stress: No Stress Concern Present (12/30/2021)   [employer] of Occupational Health - Occupational Stress Questionnaire    Feeling of Stress : Not at all  Social Connections: Unknown (12/30/2021)   Social Connection and Isolation Panel [NHANES]    Frequency of Communication with Friends and Family: Once a week    Frequency of Social Gatherings with Friends and Family: Twice a week    Attends Religious Services: Not on Insurance claims handler of Clubs or Organizations: No    Attends Banker Meetings: Never    Marital Status: Married  Catering manager Violence: Not At Risk (12/30/2021)   Humiliation, Afraid, Rape, and Kick questionnaire    Fear of Current or Ex-Partner: No  Emotionally Abused: No    Physically Abused: No    Sexually Abused: No    Lab Results  Component Value Date   HGBA1C 6.3 08/14/2022   HGBA1C 7.0 (H) 05/12/2022   HGBA1C 7.1 (H) 02/07/2022   Lab Results  Component Value Date   CHOL 82 10/12/2021   Lab Results  Component Value Date   HDL 30 (L) 10/12/2021   Lab Results  Component Value Date   LDLCALC 14 10/12/2021   Lab Results  Component Value Date   TRIG 189 (H) 10/12/2021   Lab Results  Component Value Date   CHOLHDL 2.7 10/12/2021   Lab  Results  Component Value Date   CREATININE 1.05 08/14/2022   Lab Results  Component Value Date   GFR 71.85 08/14/2022   Lab Results  Component Value Date   MICROALBUR 4.5 (H) 10/31/2021      Component Value Date/Time   NA 139 08/14/2022 1100   NA 142 08/14/2019 1136   K 4.2 08/14/2022 1100   CL 104 08/14/2022 1100   CO2 27 08/14/2022 1100   GLUCOSE 54 (L) 08/14/2022 1100   BUN 16 08/14/2022 1100   BUN 31 (H) 08/14/2019 1136   CREATININE 1.05 08/14/2022 1100   CALCIUM 9.6 08/14/2022 1100   PROT 7.1 10/12/2021 0934   PROT 7.2 09/28/2020 1452   ALBUMIN 4.0 10/12/2021 0934   ALBUMIN 4.1 09/28/2020 1452   AST 31 10/12/2021 0934   ALT 30 10/12/2021 0934   ALKPHOS 56 10/12/2021 0934   BILITOT 0.4 10/12/2021 0934   BILITOT 0.3 09/28/2020 1452   GFRNONAA 63 08/14/2019 1136   GFRAA 73 08/14/2019 1136      Latest Ref Rng & Units 08/14/2022   11:00 AM 05/12/2022   10:51 AM 02/07/2022    8:26 AM  BMP  Glucose 70 - 99 mg/dL 54  387  86   BUN 6 - 23 mg/dL 16  20  9    Creatinine 0.40 - 1.50 mg/dL 5.64  3.32  9.51   Sodium 135 - 145 mEq/L 139  142  144   Potassium 3.5 - 5.1 mEq/L 4.2  4.6  4.8   Chloride 96 - 112 mEq/L 104  105  103   CO2 19 - 32 mEq/L 27  25  31    Calcium 8.4 - 10.5 mg/dL 9.6  9.6  88.4        Component Value Date/Time   WBC 6.2 05/12/2022 1054   RBC 4.85 05/12/2022 1054   HGB 15.8 05/12/2022 1054   HGB 13.6 08/14/2019 1136   HCT 46.7 05/12/2022 1054   HCT 39.4 08/14/2019 1136   PLT 185.0 05/12/2022 1054   PLT 148 (L) 08/14/2019 1136   MCV 96.2 05/12/2022 1054   MCV 98 (H) 08/14/2019 1136   MCH 33.8 (H) 08/14/2019 1136   MCH 21.8 (L) 04/27/2015 1016   MCHC 33.8 05/12/2022 1054   RDW 12.9 05/12/2022 1054   RDW 12.4 08/14/2019 1136   LYMPHSABS 1.3 05/12/2022 1054   MONOABS 0.8 05/12/2022 1054   EOSABS 0.1 05/12/2022 1054   BASOSABS 0.0 05/12/2022 1054     Parts of this note may have been dictated using voice recognition software. There may be  variances in spelling and vocabulary which are unintentional. Not all errors are proofread. Please notify the Thereasa Parkin if any discrepancies are noted or if the meaning of any statement is not clear.

## 2022-09-14 ENCOUNTER — Ambulatory Visit (INDEPENDENT_AMBULATORY_CARE_PROVIDER_SITE_OTHER): Payer: Medicare Other | Admitting: Podiatry

## 2022-09-14 ENCOUNTER — Encounter: Payer: Self-pay | Admitting: Podiatry

## 2022-09-14 DIAGNOSIS — B351 Tinea unguium: Secondary | ICD-10-CM

## 2022-09-14 DIAGNOSIS — M79676 Pain in unspecified toe(s): Secondary | ICD-10-CM | POA: Diagnosis not present

## 2022-09-14 DIAGNOSIS — E119 Type 2 diabetes mellitus without complications: Secondary | ICD-10-CM

## 2022-09-14 NOTE — Progress Notes (Signed)
This patient returns to my office for at risk foot care.  This patient requires this care by a professional since this patient will be at risk due to having type 2 diabetes.  This patient is unable to cut nails himself since the patient cannot reach his nails.These nails are painful walking and wearing shoes.  This patient presents for at risk foot care today.  General Appearance  Alert, conversant and in no acute stress.  Vascular  Dorsalis pedis and posterior tibial  pulses are palpable  bilaterally.  Capillary return is within normal limits  bilaterally. Temperature is within normal limits  bilaterally.  Neurologic  Senn-Weinstein monofilament wire test diminished  bilaterally. Muscle power within normal limits bilaterally.  Nails Thick disfigured discolored nails with subungual debris  from hallux to fifth toes bilaterally. No evidence of bacterial infection or drainage bilaterally.  Orthopedic  No limitations of motion  feet .  No crepitus or effusions noted.  No bony pathology or digital deformities noted.  HAV  B/L.  Skin  normotropic skin with no porokeratosis noted bilaterally.  No signs of infections or ulcers noted.     Onychomycosis  Pain in right toes  Pain in left toes  Consent was obtained for treatment procedures.   Mechanical debridement of nails 1-5  bilaterally performed with a nail nipper.  Filed with dremel without incident.    Return office visit    10   weeks                  Told patient to return for periodic foot care and evaluation due to potential at risk complications.   Gregory Mayer DPM  

## 2022-09-25 DIAGNOSIS — E119 Type 2 diabetes mellitus without complications: Secondary | ICD-10-CM | POA: Diagnosis not present

## 2022-09-25 DIAGNOSIS — H401131 Primary open-angle glaucoma, bilateral, mild stage: Secondary | ICD-10-CM | POA: Diagnosis not present

## 2022-09-25 DIAGNOSIS — H2513 Age-related nuclear cataract, bilateral: Secondary | ICD-10-CM | POA: Diagnosis not present

## 2022-09-25 DIAGNOSIS — H5203 Hypermetropia, bilateral: Secondary | ICD-10-CM | POA: Diagnosis not present

## 2022-09-25 DIAGNOSIS — H524 Presbyopia: Secondary | ICD-10-CM | POA: Diagnosis not present

## 2022-09-25 LAB — HM DIABETES EYE EXAM

## 2022-10-03 ENCOUNTER — Ambulatory Visit (INDEPENDENT_AMBULATORY_CARE_PROVIDER_SITE_OTHER): Payer: Medicare Other

## 2022-10-03 ENCOUNTER — Ambulatory Visit: Payer: Medicare Other | Attending: Family Medicine

## 2022-10-03 DIAGNOSIS — I739 Peripheral vascular disease, unspecified: Secondary | ICD-10-CM

## 2022-10-03 DIAGNOSIS — Z95828 Presence of other vascular implants and grafts: Secondary | ICD-10-CM | POA: Diagnosis not present

## 2022-10-04 LAB — VAS US ABI WITH/WO TBI
Left ABI: 1.08
Right ABI: 1.15

## 2022-10-06 ENCOUNTER — Other Ambulatory Visit: Payer: Self-pay | Admitting: *Deleted

## 2022-10-06 DIAGNOSIS — I739 Peripheral vascular disease, unspecified: Secondary | ICD-10-CM

## 2022-10-09 ENCOUNTER — Other Ambulatory Visit (INDEPENDENT_AMBULATORY_CARE_PROVIDER_SITE_OTHER): Payer: Medicare Other

## 2022-10-09 DIAGNOSIS — E11649 Type 2 diabetes mellitus with hypoglycemia without coma: Secondary | ICD-10-CM

## 2022-10-09 LAB — COMPREHENSIVE METABOLIC PANEL
ALT: 28 U/L (ref 0–53)
AST: 27 U/L (ref 0–37)
Albumin: 4.1 g/dL (ref 3.5–5.2)
Alkaline Phosphatase: 64 U/L (ref 39–117)
BUN: 20 mg/dL (ref 6–23)
CO2: 31 meq/L (ref 19–32)
Calcium: 9.9 mg/dL (ref 8.4–10.5)
Chloride: 104 meq/L (ref 96–112)
Creatinine, Ser: 1.23 mg/dL (ref 0.40–1.50)
GFR: 59.36 mL/min — ABNORMAL LOW (ref >60.00)
Glucose, Bld: 78 mg/dL (ref 70–99)
Potassium: 4.9 meq/L (ref 3.5–5.1)
Sodium: 141 meq/L (ref 135–145)
Total Bilirubin: 0.6 mg/dL (ref 0.2–1.2)
Total Protein: 7.6 g/dL (ref 6.0–8.3)

## 2022-10-09 LAB — LIPID PANEL
Cholesterol: 124 mg/dL (ref 0–200)
HDL: 33.5 mg/dL — ABNORMAL LOW (ref >39.00)
LDL Cholesterol: 52 mg/dL (ref 0–99)
NonHDL: 90.54
Total CHOL/HDL Ratio: 4
Triglycerides: 195 mg/dL — ABNORMAL HIGH (ref 0.0–149.0)
VLDL: 39 mg/dL (ref 0.0–40.0)

## 2022-10-10 LAB — MICROALBUMIN / CREATININE URINE RATIO
Creatinine,U: 74.5 mg/dL
Microalb Creat Ratio: 3.8 mg/g (ref 0.0–30.0)
Microalb, Ur: 2.8 mg/dL — ABNORMAL HIGH (ref 0.0–1.9)

## 2022-10-29 ENCOUNTER — Other Ambulatory Visit: Payer: Self-pay | Admitting: Cardiovascular Disease

## 2022-10-29 DIAGNOSIS — E785 Hyperlipidemia, unspecified: Secondary | ICD-10-CM

## 2022-10-30 NOTE — Telephone Encounter (Signed)
last visit on 10/12/21 with plan to f/u in 12 months, please schedule.  Thanks

## 2022-10-31 NOTE — Telephone Encounter (Signed)
Pt scheduled on 10/9

## 2022-11-02 ENCOUNTER — Other Ambulatory Visit: Payer: Self-pay | Admitting: Cardiovascular Disease

## 2022-11-07 NOTE — Progress Notes (Unsigned)
Date:  11/08/2022   ID:  Anthony Cuevas, DOB 26-Jul-1950, MRN [id number]  Patient Location:  [address] [place] [address]   Provider location:   [facility], [place] office  PCP:  Joaquim Nam, MD  Cardiologist:  Hubbard Robinson [facility]  Chief Complaint  Patient presents with   Follow-up    12 month follow up, f/u on [place] results. Pt feels "ok" today. Medications reviewed verbally.     History of Present Illness:    Anthony Cuevas is a 72 y.o. male  past medical history of former smoker, >20 years, quit >10 years ago Coronary artery disease on CT scan Hyperlipidemia Diabetes II PAD, stent to left LE Presents for f/u of his  coronary artery disease, shortness of breath, PAD  Last seen by myself in clinic 9/23 Reports doing well on today's visit, hobbies include fishing, quilting [employer] at yard sales No regular exercise program  Denies chest pain or shortness of breath concerning for angina No claudication symptoms  October 03, 2022 lower extremity arterial Dopplers Normal ABI and patent left iliac stent.   Lab work reviewed A1C 6.3 Total cholesterol 124 LDL 52  EKG personally reviewed by myself on todays visit EKG Interpretation Date/Time:  Wednesday November 08 2022 11:44:24 EDT Ventricular Rate:  83 PR Interval:  178 QRS Duration:  86 QT Interval:  366 QTC Calculation: 430 R Axis:   33  Text Interpretation: Sinus rhythm with Premature supraventricular complexes When compared with ECG of 06-Nov-2020 14:29, Premature supraventricular complexes are now Present Confirmed by Julien Nordmann [phone]) on 11/08/2022 11:58:37 AM   Other past medical history reviewed Prior angiography which showed moderate right common iliac artery stenosis, occluded and heavily calcified left common iliac artery in a long segment.   -successful angioplasty and covered stent placement to the left common iliac artery.  Postprocedure vascular  studies showed improvement in ABI to normal with patent iliac stent and moderately elevated velocities just distal to the stent.  Prior imaging reviewed CT scan chest July 29, 2018 with at least moderate three-vessel coronary atherosclerosis, Moderate diffuse aortic atherosclerosis Emphysema   Past Medical History:  Diagnosis Date   Arthritis    Coronary atherosclerosis    a. 07/2018 CT Chest: 3 vessel coronary atherosclerosis; b. 07/2018 MV: EF 46% (GI uptake artifact), No ischemia/infarct. Mild to mod diffuse Ao atherosclerosis and at least moderate 2 vessel CAD (LAD/RCA) on CT imaging. Low risk.   Depression    improved on sertraline   Diabetes mellitus, type 2 (HCC) 09/1996   GERD (gastroesophageal reflux disease) 1990   History of peptic ulcer disease    Hyperlipidemia 1992   Hypertension 05/2003   Lumbar stenosis L2-3   Muscle atrophy    in arms from degenerative changes in neck   Neck pain    chronic   Smoker    Wears glasses    Past Surgical History:  Procedure Laterality Date   ABDOMINAL AORTOGRAM W/LOWER EXTREMITY N/A 09/03/2019   Procedure: ABDOMINAL AORTOGRAM W/LOWER EXTREMITY;  Surgeon: Iran Ouch, MD;  Location: [facility];  Service: Cardiovascular;  Laterality: N/A;  Limited Study   CERVICAL DISC SURGERY  04/19/2002   fusion, Dr. Ophelia Charter   CERVICAL DISCECTOMY  01/31/1995   partial   CERVICAL DISCECTOMY  01/30/1998   COLONOSCOPY WITH ESOPHAGOGASTRODUODENOSCOPY (EGD)  06/25/2015   ENTEROSCOPY N/A 07/04/2021   Procedure: ENTEROSCOPY;  Surgeon: Sherrilyn Rist, MD;  Location: [facility];  Service: Gastroenterology;  Laterality: N/A;   HEMORRHOID SURGERY  01/31/1987   HOT HEMOSTASIS N/A 07/04/2021   Procedure: HOT HEMOSTASIS (ARGON PLASMA COAGULATION/BICAP);  Surgeon: Sherrilyn Rist, MD;  Location: [facility];  Service: Gastroenterology;  Laterality: N/A;   KNEE ARTHROPLASTY  11/20/2011   Procedure: COMPUTER ASSISTED TOTAL KNEE ARTHROPLASTY;   Surgeon: Eldred Manges, MD;  Location: [facility] OR;  Service: Orthopedics;  Laterality: Left;  Conversion Left Knee Medial Uni to Total Knee Arthroplasty-Cemented   LUMBAR LAMINECTOMY/DECOMPRESSION MICRODISCECTOMY N/A 04/28/2015   Procedure: L2-3 Decompression;  Surgeon: Eldred Manges, MD;  Location: [facility] OR;  Service: Orthopedics;  Laterality: N/A;   MR MRA DUPLICATE EXAM  INACTIVATE     MULTIPLE TOOTH EXTRACTIONS     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 Samuella Cota   STOMACH SURGERY  01/30/1982   [facility], [facility]   SUBMUCOSAL TATTOO INJECTION  07/04/2021   Procedure: SUBMUCOSAL TATTOO INJECTION;  Surgeon: Sherrilyn Rist, MD;  Location: [facility];  Service: Gastroenterology;;     Current Meds  Medication Sig   ascorbic acid (VITAMIN C) 500 MG tablet Take 1 tablet (500 mg total) by mouth daily.   Cholecalciferol (VITAMIN D3) 125 MCG (5000 UT) CAPS Take 5,000 Units by mouth 2 (two) times a week.   clopidogrel (PLAVIX) 75 MG tablet Take 1 tablet (75 mg total) by mouth daily.   Continuous Glucose Sensor (FREESTYLE LIBRE 3 SENSOR) MISC Place 1 sensor on the skin every 14 days. Use to check glucose continuously   cyanocobalamin 1000 MCG tablet Take 1,000 mcg by mouth daily.   cyclobenzaprine (FLEXERIL) 10 MG tablet TAKE 1 TABLET TWICE A DAY AS NEEDED FOR MUSCLE SPASMS (SEDATION CAUTION)   Dulaglutide (TRULICITY) 4.5 MG/0.5ML SOPN Inject 4.5 mg as directed once a week.   Glucagon (GVOKE HYPOPEN 1-PACK) 1 MG/0.2ML SOAJ Inject 1 mg into the skin as needed (low blood sugar with imaopired consciousness).   glucose blood (ONETOUCH ULTRA) test strip USE AS INSTRUCTED TO TEST BLOOD SUGARS 3 OR 4 TIMES DAILY   insulin aspart (NOVOLOG  FLEXPEN) 100 UNIT/ML FlexPen INJECT 30 UNITS BEFORE MEALS AS DIRECTED   insulin glargine, 2 Unit Dial, (TOUJEO MAX SOLOSTAR) 300 UNIT/ML Solostar Pen ADMINISTER 100 UNITS ONCE DAILY AND ADJUSTING AS DIRECTED TO MAXIMUM DOSE OF 140 UNITS DAILY (Patient taking differently: 90 units)   Insulin Pen Needle (SURE COMFORT PEN NEEDLES) 32G X 4 MM MISC USE THREE TIMES A DAY   Iron, Ferrous Sulfate, 325 (65 Fe) MG TABS Take on Sunday and Wednesday.  2 tabs per week.   JARDIANCE 25 MG TABS tablet TAKE 1 TABLET DAILY BEFORE BREAKFAST   latanoprost (XALATAN) 0.005 % ophthalmic solution Place 1 drop into both eyes at bedtime.   lisinopril (ZESTRIL) 40 MG tablet TAKE 1 TABLET DAILY   Omega-3 Fatty Acids (FISH OIL PO) Take 1,000 mg by mouth once a week.   rosuvastatin (CRESTOR) 20 MG tablet Take 1 tablet (20 mg total) by mouth daily. PLEASE CALL OFFICE TO SCHEDULE APPOINTMENT PRIOR TO NEXT REFILL (first attempt)   traMADol (ULTRAM) 50 MG tablet TAKE 1 TABLET THREE TIMES A DAY AS NEEDED FOR MODERATE PAIN  Allergies:   Patient has no known allergies.   Social History   Tobacco Use   Smoking status: Former    Current packs/day: 0.00    Average packs/day: 1 pack/day for 41.0 years (41.0 ttl pk-yrs)    Types: Cigarettes    Start date: 56    Quit date: 2014    Years since quitting: 10.7   Smokeless tobacco: Never  Vaping Use   Vaping status: Never Used  Substance Use Topics   Alcohol use: No    Alcohol/week: 0.0 standard drinks of alcohol   Drug use: Never     Family Hx: The patient's family history includes Breast cancer in his maternal grandmother; Diabetes in his brother, brother, father, mother, and sister; Heart disease in his brother and son; Hypertension in his mother; Lung disease in his paternal grandfather; Stroke in his mother. There is no history of Arthritis, Prostate cancer, Colon cancer, or Stomach cancer.  ROS:   Please see the history of present illness.    Review of Systems   Constitutional: Negative.   HENT: Negative.    Respiratory:  Positive for shortness of breath.   Cardiovascular: Negative.   Gastrointestinal: Negative.   Musculoskeletal: Negative.   Neurological: Negative.   Psychiatric/Behavioral: Negative.    All other systems reviewed and are negative.   Labs/Other Tests and Data Reviewed:    Recent Labs: 05/12/2022: Hemoglobin 15.8; Platelets 185.0 10/09/2022: ALT 28; BUN 20; Creatinine, Ser 1.23; Potassium 4.9; Sodium 141   Recent Lipid Panel Lab Results  Component Value Date/Time   CHOL 124 10/09/2022 08:01 AM   CHOL 108 09/28/2020 02:52 PM   TRIG 195.0 (H) 10/09/2022 08:01 AM   HDL 33.50 (L) 10/09/2022 08:01 AM   HDL 30 (L) 09/28/2020 02:52 PM   CHOLHDL 4 10/09/2022 08:01 AM   LDLCALC 52 10/09/2022 08:01 AM   LDLCALC 45 09/28/2020 02:52 PM   LDLDIRECT 78.0 12/05/2019 07:53 AM    Wt Readings from Last 3 Encounters:  11/08/22 191 lb 3.2 oz (86.7 kg)  09/06/22 194 lb 9.6 oz (88.3 kg)  08/14/22 198 lb 1.6 oz (89.9 kg)     Exam:    Vital Signs: Vital signs may also be detailed in the HPI BP 130/78 (BP Location: Left Arm, Patient Position: Sitting, Cuff Size: Normal)   Pulse 83   Ht 5\' 10"  (1.778 m)   Wt 191 lb 3.2 oz (86.7 kg)   SpO2 97%   BMI 27.43 kg/m   Constitutional:  oriented to person, place, and time. No distress.  HENT:  Head: Grossly normal Eyes:  no discharge. No scleral icterus.  Neck: No JVD, no carotid bruits  Cardiovascular: Regular rate and rhythm, no murmurs appreciated Pulmonary/Chest: Clear to auscultation bilaterally, no wheezes or rails Abdominal: Soft.  no distension.  no tenderness.  Musculoskeletal: Normal range of motion Neurological:  normal muscle tone. Coordination normal. No atrophy Skin: Skin warm and dry Psychiatric: normal affect, pleasant  ASSESSMENT & PLAN:    PAD (peripheral artery disease) (HCC)  Uncontrolled type 2 diabetes mellitus with hypoglycemia without coma  (HCC)  Atherosclerosis of native coronary artery of native heart without angina pectoris -     EKG 12-Lead  Essential hypertension  Hyperlipidemia, unspecified hyperlipidemia type  S/P insertion of iliac artery stent -     EKG 12-Lead  Claudication in peripheral vascular disease (HCC)   CAD with stable angina On plavix, Crestor, Zetia Currently with no symptoms of angina. No further workup at this  time. Continue current medication regimen. Lipids at goal  PAD No claudication sx.  Prior stenting, recent Dopplers showing stable flow Followed by Dr. Kirke Corin  Hyperlipidemia Continue Zetia with his Crestor Cholesterol at goal  Diabetes with complictions A1c trending downward, now less than 7 on current medication regiment managed by primary care   Total encounter time more than 30 minutes  Greater than 50% was spent in counseling and coordination of care with the patient   Signed, Julien Nordmann, MD  [facility] [address], [place], [place] [address]

## 2022-11-08 ENCOUNTER — Other Ambulatory Visit: Payer: Medicare Other

## 2022-11-08 ENCOUNTER — Ambulatory Visit: Payer: Medicare Other | Attending: Cardiovascular Disease | Admitting: Cardiovascular Disease

## 2022-11-08 ENCOUNTER — Encounter: Payer: Self-pay | Admitting: Cardiovascular Disease

## 2022-11-08 VITALS — BP 130/78 | HR 83 | Ht 70.0 in | Wt 191.2 lb

## 2022-11-08 DIAGNOSIS — E11649 Type 2 diabetes mellitus with hypoglycemia without coma: Secondary | ICD-10-CM

## 2022-11-08 DIAGNOSIS — I739 Peripheral vascular disease, unspecified: Secondary | ICD-10-CM

## 2022-11-08 DIAGNOSIS — E785 Hyperlipidemia, unspecified: Secondary | ICD-10-CM | POA: Diagnosis not present

## 2022-11-08 DIAGNOSIS — Z95828 Presence of other vascular implants and grafts: Secondary | ICD-10-CM | POA: Diagnosis not present

## 2022-11-08 DIAGNOSIS — I10 Essential (primary) hypertension: Secondary | ICD-10-CM | POA: Diagnosis not present

## 2022-11-08 DIAGNOSIS — I2510 Atherosclerotic heart disease of native coronary artery without angina pectoris: Secondary | ICD-10-CM

## 2022-11-08 MED ORDER — ROSUVASTATIN CALCIUM 20 MG PO TABS: 20.0000 mg | ORAL_TABLET | Freq: Every day | ORAL | 3 refills | Status: DC

## 2022-11-08 MED ORDER — CLOPIDOGREL BISULFATE 75 MG PO TABS: 75.0000 mg | ORAL_TABLET | Freq: Every day | ORAL | 3 refills | Status: DC

## 2022-11-08 MED ORDER — LISINOPRIL 40 MG PO TABS: 40.0000 mg | ORAL_TABLET | Freq: Every day | ORAL | 3 refills | Status: DC

## 2022-11-08 NOTE — Patient Instructions (Signed)

## 2022-11-13 ENCOUNTER — Ambulatory Visit (INDEPENDENT_AMBULATORY_CARE_PROVIDER_SITE_OTHER): Payer: Medicare Other | Admitting: Family Medicine

## 2022-11-13 ENCOUNTER — Encounter: Payer: Self-pay | Admitting: Family Medicine

## 2022-11-13 VITALS — BP 140/78 | HR 85 | Temp 98.4°F | Ht 70.0 in | Wt 193.8 lb

## 2022-11-13 DIAGNOSIS — L723 Sebaceous cyst: Secondary | ICD-10-CM | POA: Diagnosis not present

## 2022-11-13 DIAGNOSIS — Z23 Encounter for immunization: Secondary | ICD-10-CM | POA: Diagnosis not present

## 2022-11-13 MED ORDER — TOUJEO MAX SOLOSTAR 300 UNIT/ML ~~LOC~~ SOPN: PEN_INJECTOR | SUBCUTANEOUS | Status: DC

## 2022-11-13 NOTE — Patient Instructions (Signed)
Keep it covered in the meantime.  Pull the packing on Wednesday morning.  Wash with soapy water at that point.  Keep covered if needed and update me as needed.  Take care.  Glad to see you.

## 2022-11-13 NOTE — Progress Notes (Unsigned)
I&D  Meds, vitals, and allergies reviewed.   Indication: seb cyst  Pt complaints of: swelling, episodic discharge.  Location: R shoulder.   Size: 3 cm  Informed consent obtained.  Pt aware of risks not limited to but including infection, bleeding, damage to near by organs.  Prep: etoh/betadine  Anesthesia: 1%lidocaine with epi, good effect  Incision made with #11 blade  Wound explored and loculations removed  Wound packed with iodoform gauze  Tolerated well

## 2022-11-15 ENCOUNTER — Ambulatory Visit: Payer: Medicare Other | Admitting: "Endocrinology

## 2022-11-15 DIAGNOSIS — L723 Sebaceous cyst: Secondary | ICD-10-CM | POA: Insufficient documentation

## 2022-11-15 NOTE — Assessment & Plan Note (Signed)
Routine postprocedure instructions d/w pt- remove packing in 48h, keep area clean and bandaged, follow up if concerns/spreading erythema/pain.  He agrees with plan.  Tolerated well.  No complication.

## 2022-11-20 ENCOUNTER — Ambulatory Visit: Payer: Medicare Other | Admitting: "Endocrinology

## 2022-11-21 ENCOUNTER — Encounter: Payer: Self-pay | Admitting: "Endocrinology

## 2022-11-21 ENCOUNTER — Ambulatory Visit (INDEPENDENT_AMBULATORY_CARE_PROVIDER_SITE_OTHER): Payer: Medicare Other | Admitting: "Endocrinology

## 2022-11-21 VITALS — Ht 70.0 in | Wt 192.0 lb

## 2022-11-21 DIAGNOSIS — Z7984 Long term (current) use of oral hypoglycemic drugs: Secondary | ICD-10-CM

## 2022-11-21 DIAGNOSIS — Z7985 Long-term (current) use of injectable non-insulin antidiabetic drugs: Secondary | ICD-10-CM

## 2022-11-21 DIAGNOSIS — E782 Mixed hyperlipidemia: Secondary | ICD-10-CM

## 2022-11-21 DIAGNOSIS — E11649 Type 2 diabetes mellitus with hypoglycemia without coma: Secondary | ICD-10-CM | POA: Diagnosis not present

## 2022-11-21 DIAGNOSIS — E1165 Type 2 diabetes mellitus with hyperglycemia: Secondary | ICD-10-CM | POA: Diagnosis not present

## 2022-11-21 DIAGNOSIS — Z794 Long term (current) use of insulin: Secondary | ICD-10-CM

## 2022-11-21 LAB — POCT GLYCOSYLATED HEMOGLOBIN (HGB A1C): Hemoglobin A1C: 5.8 % — AB (ref 4.0–5.6)

## 2022-11-21 NOTE — Patient Instructions (Addendum)
Toujeo 85 units once at night Novolog insulin 30 units tidac 15 min (not 30 min) before meals       Jardiance 25 mg every day  Trulicity 4.5 mg weekly  _______________    Goals of DM therapy:  Morning Fasting blood sugar: 80-140  Blood sugar before meals: 80-140 Bed time blood sugar: 100-150  A1C <7%, limited only by hypoglycemia  1.Diabetes medications and their side effects discussed, including hypoglycemia    2. Check blood glucose:  a) Always check blood sugars before driving. Please see below (under hypoglycemia) on how to manage b) Check a minimum of 3 times/day or more as needed when having symptoms of hypoglycemia.   c) Try to check blood glucose before sleeping/in the middle of the night to ensure that it is remaining stable and not dropping less than 100 d) Check blood glucose more often if sick  3. Diet: a) 3 meals per day schedule b: Restrict carbs to 60-70 grams (4 servings) per meal c) Colorful vegetables - 3 servings a day, and low sugar fruit 2 servings/day Plate control method: 1/4 plate protein, 1/4 starch, 1/2 green, yellow, or red vegetables d) Avoid carbohydrate snacks unless hypoglycemic episode, or increased physical activity  4. Regular exercise as tolerated, preferably 3 or more hours a week  5. Hypoglycemia: a)  Do not drive or operate machinery without first testing blood glucose to assure it is over 90 mg%, or if dizzy, lightheaded, not feeling normal, etc, or  if foot or leg is numb or weak. b)  If blood glucose less than 70, take four 5gm Glucose tabs or 15-30 gm Glucose gel.  Repeat every 15 min as needed until blood sugar is >100 mg/dl. If hypoglycemia persists then call 911.   6. Sick day management: a) Check blood glucose more often b) Continue usual therapy if blood sugars are elevated.   7. Contact the doctor immediately if blood glucose is frequently <60 mg/dl, or an episode of severe hypoglycemia occurs (where someone had to give you  glucose/  glucagon or if you passed out from a low blood glucose), or if blood glucose is persistently >350 mg/dl, for further management  8. A change in level of physical activity or exercise and a change in diet may also affect your blood sugar. Check blood sugars more often and call if needed.  Instructions: 1. Bring glucose meter, blood glucose records on every visit for review 2. Continue to follow up with primary care physician and other providers for medical care 3. Yearly eye  and foot exam 4. Please get blood work done prior to the next appointment

## 2022-11-21 NOTE — Progress Notes (Signed)
Outpatient Endocrinology Note Anthony , MD  11/21/22   Anthony Cuevas 1950-03-06 [id number]  Referring Provider: Joaquim Nam, MD Primary Care Provider: Joaquim Nam, MD Reason for consultation: Subjective   Assessment & Plan  Diagnoses and all orders for this visit:  Uncontrolled type 2 diabetes mellitus with hypoglycemia without coma (HCC) -     POCT glycosylated hemoglobin (Hb A1C)  Long-term insulin use (HCC)  Long term (current) use of oral hypoglycemic drugs  Long-term (current) use of injectable non-insulin antidiabetic drugs  Mixed hypercholesterolemia and hypertriglyceridemia  Type 2 diabetes mellitus with hyperglycemia, with long-term current use of insulin (HCC)   Diabetes Type 2 complicated by neuropathy   Lab Results  Component Value Date   GFR 59.36 (L) 10/09/2022   Hba1c goal less than 7, current Hba1c is  Lab Results  Component Value Date   HGBA1C 5.8 (A) 11/21/2022   Will recommend the following: Toujeo 85 units once at night Novolog insulin 30 units tidac 15 min (not 30 min) before meals       Jardiance 25 mg every day  Trulicity 4.5 mg weekly Follows with foot doctor q3 mo with CA, has a stent ion leg and has vascular studies done per patient   No known contraindications/side effects to any of above medications Glucagon ordered with refills on 07/18/22 No history of MEN syndrome/medullary thyroid cancer/pancreatitis or pancreatic cancer in self or family  -Last LD and Tg are as follows: Lab Results  Component Value Date   LDLCALC 52 10/09/2022    Lab Results  Component Value Date   TRIG 195.0 (H) 10/09/2022   -Recommend rosuvastatin 20 mg QD -Follow low fat diet and exercise   -Blood pressure goal <140/90 - Microalbumin/creatinine goal < 30 -Last MA/Cr is as follows: Lab Results  Component Value Date   MICROALBUR 2.8 (H) 10/09/2022   - on ACE/ARB lisinopril 40 mg qd -diet changes including salt  restriction -limit eating outside -counseled BP targets per standards of diabetes care -uncontrolled blood pressure can lead to retinopathy, nephropathy and cardiovascular and atherosclerotic heart disease  Reviewed and counseled on: -A1C target -Blood sugar targets -Complications of uncontrolled diabetes  -Checking blood sugar before meals and bedtime and bring log next visit -All medications with mechanism of action and side effects -Hypoglycemia management: rule of 15's, Glucagon Emergency Kit and medical alert ID -low-carb low-fat plate-method diet -At least 20 minutes of physical activity per day -Annual dilated retinal eye exam and foot exam -compliance and follow up needs -follow up as scheduled or earlier if problem gets worse  Call if blood sugar is less than 70 or consistently above 250    Take a 15 gm snack of carbohydrate at bedtime before you go to sleep if your blood sugar is less than 100.    If you are going to fast after midnight for a test or procedure, ask your physician for instructions on how to reduce/decrease your insulin dose.    Call if blood sugar is less than 70 or consistently above 250  -Treating a low sugar by rule of 15  (15 gms of sugar every 15 min until sugar is more than 70) If you feel your sugar is low, test your sugar to be sure If your sugar is low (less than 70), then take 15 grams of a fast acting Carbohydrate (3-4 glucose tablets or glucose gel or 4 ounces of juice or regular soda) Recheck your sugar 15 min after  treating low to make sure it is more than 70 If sugar is still less than 70, treat again with 15 grams of carbohydrate          Don't drive the hour of hypoglycemia  If unconscious/unable to eat or drink by mouth, use glucagon injection or nasal spray baqsimi and call 911. Can repeat again in 15 min if still unconscious.  Return in about 29 days (around 12/20/2022).   I have reviewed current medications, nurse's notes, allergies,  vital signs, past medical and surgical history, family medical history, and social history for this encounter. Counseled patient on symptoms, examination findings, lab findings, imaging results, treatment decisions and monitoring and prognosis. The patient understood the recommendations and agrees with the treatment plan. All questions regarding treatment plan were fully answered.  Anthony [place], MD  11/21/22    History of Present Illness Anthony Cuevas is a 72 y.o. year old male who presents for evaluation of Type 2 diabetes mellitus.  Anthony Cuevas was first diagnosed in 53.   Diabetes education +  Home diabetes regimen: Toujeo 90 units once at night, Novolog insulin 30 units tidac 15-30 min before meals       Jardiance 25 mg every day  Trulicity 4.5 mg weekly    Previous history: Non-insulin hypoglycemic drugs previously used: Actos, metformin stopped in 5/23, glipizide Insulin was started in 2006 and has taken Lantus, NovoLog as well as NPH and regular  COMPLICATIONS -  MI/Stroke -  retinopathy +  neuropathy -  nephropathy  BLOOD SUGAR DATA  CGM interpretation: At today's visit, we reviewed her CGM downloads. The full report is scanned in the media. Reviewing the CGM trends, BG are low across overnight and controlled otherwise.  Physical Exam  Ht 5\' 10"  (1.778 m)   Wt 192 lb (87.1 kg)   SpO2 99%   BMI 27.55 kg/m    Constitutional: well developed, well nourished Head: normocephalic, atraumatic Eyes: sclera anicteric, no redness Neck: supple Lungs: normal respiratory effort Neurology: alert and oriented Skin: dry, no appreciable rashes Musculoskeletal: no appreciable defects Psychiatric: normal mood and affect Diabetic Foot Exam - Simple   Simple Foot Form Diabetic Foot exam was performed with the following findings: Yes 11/21/2022  9:02 AM  Visual Inspection No deformities, no ulcerations, no other skin breakdown bilaterally: Yes Sensation Testing Intact  to touch and monofilament testing bilaterally: Yes Pulse Check Posterior Tibialis and Dorsalis pulse intact bilaterally: Yes Comments Posterior tibial weak, thick toe nails + multiple corns       Current Medications Patient's Medications  New Prescriptions   No medications on file  Previous Medications   ASCORBIC ACID (VITAMIN C) 500 MG TABLET    Take 1 tablet (500 mg total) by mouth daily.   CHOLECALCIFEROL (VITAMIN D3) 125 MCG (5000 UT) CAPS    Take 5,000 Units by mouth 2 (two) times a week.   CLOPIDOGREL (PLAVIX) 75 MG TABLET    Take 1 tablet (75 mg total) by mouth daily.   CONTINUOUS GLUCOSE SENSOR (FREESTYLE LIBRE 3 SENSOR) MISC    Place 1 sensor on the skin every 14 days. Use to check glucose continuously   CYANOCOBALAMIN 1000 MCG TABLET    Take 1,000 mcg by mouth daily.   CYCLOBENZAPRINE (FLEXERIL) 10 MG TABLET    TAKE 1 TABLET TWICE A DAY AS NEEDED FOR MUSCLE SPASMS (SEDATION CAUTION)   DULAGLUTIDE (TRULICITY) 4.5 MG/0.5ML SOPN    Inject 4.5 mg as directed once a week.  GLUCAGON (GVOKE HYPOPEN 1-PACK) 1 MG/0.2ML SOAJ    Inject 1 mg into the skin as needed (low blood sugar with imaopired consciousness).   GLUCOSE BLOOD (ONETOUCH ULTRA) TEST STRIP    USE AS INSTRUCTED TO TEST BLOOD SUGARS 3 OR 4 TIMES DAILY   INSULIN ASPART (NOVOLOG FLEXPEN) 100 UNIT/ML FLEXPEN    INJECT 30 UNITS BEFORE MEALS AS DIRECTED   INSULIN GLARGINE, 2 UNIT DIAL, (TOUJEO MAX SOLOSTAR) 300 UNIT/ML SOLOSTAR PEN    90 units at supper   INSULIN PEN NEEDLE (SURE COMFORT PEN NEEDLES) 32G X 4 MM MISC    USE THREE TIMES A DAY   IRON, FERROUS SULFATE, 325 (65 FE) MG TABS    Take on Sunday and Wednesday.  2 tabs per week.   JARDIANCE 25 MG TABS TABLET    TAKE 1 TABLET DAILY BEFORE BREAKFAST   LATANOPROST (XALATAN) 0.005 % OPHTHALMIC SOLUTION    Place 1 drop into both eyes at bedtime.   LISINOPRIL (ZESTRIL) 40 MG TABLET    Take 1 tablet (40 mg total) by mouth daily.   OMEGA-3 FATTY ACIDS (FISH OIL PO)    Take 1,000 mg  by mouth once a week.   ROSUVASTATIN (CRESTOR) 20 MG TABLET    Take 1 tablet (20 mg total) by mouth daily.   TRAMADOL (ULTRAM) 50 MG TABLET    TAKE 1 TABLET THREE TIMES A DAY AS NEEDED FOR MODERATE PAIN  Modified Medications   No medications on file  Discontinued Medications   No medications on file    Allergies No Known Allergies  Past Medical History Past Medical History:  Diagnosis Date   Arthritis    Coronary atherosclerosis    a. 07/2018 CT Chest: 3 vessel coronary atherosclerosis; b. 07/2018 MV: EF 46% (GI uptake artifact), No ischemia/infarct. Mild to mod diffuse Ao atherosclerosis and at least moderate 2 vessel CAD (LAD/RCA) on CT imaging. Low risk.   Depression    improved on sertraline   Diabetes mellitus, type 2 (HCC) 09/1996   GERD (gastroesophageal reflux disease) 1990   History of peptic ulcer disease    Hyperlipidemia 1992   Hypertension 05/2003   Lumbar stenosis L2-3   Muscle atrophy    in arms from degenerative changes in neck   Neck pain    chronic   Smoker    Wears glasses     Past Surgical History Past Surgical History:  Procedure Laterality Date   ABDOMINAL AORTOGRAM W/LOWER EXTREMITY N/A 09/03/2019   Procedure: ABDOMINAL AORTOGRAM W/LOWER EXTREMITY;  Surgeon: Iran Ouch, MD;  Location: [facility];  Service: Cardiovascular;  Laterality: N/A;  Limited Study   CERVICAL DISC SURGERY  04/19/2002   fusion, Dr. Ophelia Charter   CERVICAL DISCECTOMY  01/31/1995   partial   CERVICAL DISCECTOMY  01/30/1998   COLONOSCOPY WITH ESOPHAGOGASTRODUODENOSCOPY (EGD)  06/25/2015   ENTEROSCOPY N/A 07/04/2021   Procedure: ENTEROSCOPY;  Surgeon: Sherrilyn Rist, MD;  Location: [facility];  Service: Gastroenterology;  Laterality: N/A;   HEMORRHOID SURGERY  01/31/1987   HOT HEMOSTASIS N/A 07/04/2021   Procedure: HOT HEMOSTASIS (ARGON PLASMA COAGULATION/BICAP);  Surgeon: Sherrilyn Rist, MD;  Location: [facility];  Service: Gastroenterology;  Laterality: N/A;    KNEE ARTHROPLASTY  11/20/2011   Procedure: COMPUTER ASSISTED TOTAL KNEE ARTHROPLASTY;  Surgeon: Eldred Manges, MD;  Location: [facility] OR;  Service: Orthopedics;  Laterality: Left;  Conversion Left Knee Medial Uni to Total Knee Arthroplasty-Cemented   LUMBAR LAMINECTOMY/DECOMPRESSION MICRODISCECTOMY N/A  04/28/2015   Procedure: L2-3 Decompression;  Surgeon: Eldred Manges, MD;  Location[facility];  Service: Orthopedics;  Laterality: N/A;   MR MRA DUPLICATE EXAM  INACTIVATE     MULTIPLE TOOTH EXTRACTIONS     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 Samuella Cota   STOMACH SURGERY  01/30/1982   [facility], [facility]   SUBMUCOSAL TATTOO INJECTION  07/04/2021   Procedure: SUBMUCOSAL TATTOO INJECTION;  Surgeon: Sherrilyn Rist, MD;  Location: [facility];  Service: Gastroenterology;;    Family History family history includes Breast cancer in his maternal grandmother; Diabetes in his brother, brother, father, mother, and sister; Heart disease in his brother and son; Hypertension in his mother; Lung disease in his paternal grandfather; Stroke in his mother.  Social History Social History   Socioeconomic History   Marital status: Married    Spouse name: Not on file   Number of children: 3   Years of education: Not on file   Highest education level: Not on file  Occupational History   Occupation: Ship broker, retired    Comment: Firefighter  Tobacco Use   Smoking status: Former    Current packs/day: 0.00    Average packs/day: 1 pack/day for 41.0 years (41.0 ttl pk-yrs)    Types: Cigarettes    Start date: 17    Quit date: 2014    Years since quitting: 10.8   Smokeless tobacco: Never  Vaping Use   Vaping status: Never Used   Substance and Sexual Activity   Alcohol use: No    Alcohol/week: 0.0 standard drinks of alcohol   Drug use: Never   Sexual activity: Not Currently  Other Topics Concern   Not on file  Social History Narrative   Retired Photographer, [employer], aviation fuel, [phone], no known agent orange exposure but 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   Married 1975   3 kids   Social Determinants of Health   Financial Resource Strain: Low Risk  (12/30/2021)   Overall Financial Resource Strain (CARDIA)    Difficulty of Paying Living Expenses: Not very hard  Food Insecurity: No Food Insecurity (12/30/2021)   Hunger Vital Sign    Worried About Running Out of Food in the Last Year: Never true    Ran Out of Food in the Last Year: Never true  Transportation Needs: No Transportation Needs (12/30/2021)   PRAPARE - Administrator, Civil Service (Medical): No    Lack of Transportation (Non-Medical): No  Physical Activity: Insufficiently Active (12/30/2021)   Exercise Vital Sign    Days of Exercise per Week: 2 days    Minutes of Exercise per Session: 20 min  Stress: No Stress Concern Present (12/30/2021)   [employer] of Occupational Health - Occupational Stress Questionnaire    Feeling of Stress : Not at all  Social Connections: Unknown (12/30/2021)   Social Connection and Isolation Panel [NHANES]    Frequency of Communication with Friends and Family: Once a week    Frequency of Social Gatherings with Friends and Family: Twice a week    Attends Religious Services: Not on Insurance claims handler of Clubs or Organizations: No  Attends Club or Organization Meetings: Never    Marital Status: Married  Catering manager Violence: Not At Risk (12/30/2021)   Humiliation, Afraid, Rape, and Kick questionnaire    Fear of Current or Ex-Partner: No    Emotionally Abused: No    Physically Abused: No    Sexually Abused: No    Lab Results  Component Value Date   HGBA1C 5.8 (A) 11/21/2022    HGBA1C 6.3 08/14/2022   HGBA1C 7.0 (H) 05/12/2022   Lab Results  Component Value Date   CHOL 124 10/09/2022   Lab Results  Component Value Date   HDL 33.50 (L) 10/09/2022   Lab Results  Component Value Date   LDLCALC 52 10/09/2022   Lab Results  Component Value Date   TRIG 195.0 (H) 10/09/2022   Lab Results  Component Value Date   CHOLHDL 4 10/09/2022   Lab Results  Component Value Date   CREATININE 1.23 10/09/2022   Lab Results  Component Value Date   GFR 59.36 (L) 10/09/2022   Lab Results  Component Value Date   MICROALBUR 2.8 (H) 10/09/2022      Component Value Date/Time   NA 141 10/09/2022 0801   NA 142 08/14/2019 1136   K 4.9 10/09/2022 0801   CL 104 10/09/2022 0801   CO2 31 10/09/2022 0801   GLUCOSE 78 10/09/2022 0801   BUN 20 10/09/2022 0801   BUN 31 (H) 08/14/2019 1136   CREATININE 1.23 10/09/2022 0801   CALCIUM 9.9 10/09/2022 0801   PROT 7.6 10/09/2022 0801   PROT 7.2 09/28/2020 1452   ALBUMIN 4.1 10/09/2022 0801   ALBUMIN 4.1 09/28/2020 1452   AST 27 10/09/2022 0801   ALT 28 10/09/2022 0801   ALKPHOS 64 10/09/2022 0801   BILITOT 0.6 10/09/2022 0801   BILITOT 0.3 09/28/2020 1452   GFRNONAA 63 08/14/2019 1136   GFRAA 73 08/14/2019 1136      Latest Ref Rng & Units 10/09/2022    8:01 AM 08/14/2022   11:00 AM 05/12/2022   10:51 AM  BMP  Glucose 70 - 99 mg/dL 78  54  409   BUN 6 - 23 mg/dL 20  16  20    Creatinine 0.40 - 1.50 mg/dL 8.11  9.14  7.82   Sodium 135 - 145 mEq/L 141  139  142   Potassium 3.5 - 5.1 mEq/L 4.9  4.2  4.6   Chloride 96 - 112 mEq/L 104  104  105   CO2 19 - 32 mEq/L 31  27  25    Calcium 8.4 - 10.5 mg/dL 9.9  9.6  9.6        Component Value Date/Time   WBC 6.2 05/12/2022 1054   RBC 4.85 05/12/2022 1054   HGB 15.8 05/12/2022 1054   HGB 13.6 08/14/2019 1136   HCT 46.7 05/12/2022 1054   HCT 39.4 08/14/2019 1136   PLT 185.0 05/12/2022 1054   PLT 148 (L) 08/14/2019 1136   MCV 96.2 05/12/2022 1054   MCV 98 (H)  08/14/2019 1136   MCH 33.8 (H) 08/14/2019 1136   MCH 21.8 (L) 04/27/2015 1016   MCHC 33.8 05/12/2022 1054   RDW 12.9 05/12/2022 1054   RDW 12.4 08/14/2019 1136   LYMPHSABS 1.3 05/12/2022 1054   MONOABS 0.8 05/12/2022 1054   EOSABS 0.1 05/12/2022 1054   BASOSABS 0.0 05/12/2022 1054     Parts of this note may have been dictated using voice recognition software. There may be variances in spelling and  vocabulary which are unintentional. Not all errors are proofread. Please notify the Thereasa Parkin if any discrepancies are noted or if the meaning of any statement is not clear.

## 2022-11-27 ENCOUNTER — Ambulatory Visit: Payer: Medicare Other | Admitting: Podiatry

## 2022-11-29 ENCOUNTER — Ambulatory Visit (INDEPENDENT_AMBULATORY_CARE_PROVIDER_SITE_OTHER): Payer: Medicare Other | Admitting: Podiatry

## 2022-11-29 ENCOUNTER — Encounter: Payer: Self-pay | Admitting: Podiatry

## 2022-11-29 DIAGNOSIS — M79676 Pain in unspecified toe(s): Secondary | ICD-10-CM | POA: Diagnosis not present

## 2022-11-29 DIAGNOSIS — B351 Tinea unguium: Secondary | ICD-10-CM

## 2022-11-29 DIAGNOSIS — E119 Type 2 diabetes mellitus without complications: Secondary | ICD-10-CM

## 2022-11-29 NOTE — Progress Notes (Signed)
This patient returns to my office for at risk foot care.  This patient requires this care by a professional since this patient will be at risk due to having type 2 diabetes.  This patient is unable to cut nails himself since the patient cannot reach his nails.These nails are painful walking and wearing shoes.  This patient presents for at risk foot care today.  General Appearance  Alert, conversant and in no acute stress.  Vascular  Dorsalis pedis and posterior tibial  pulses are palpable  bilaterally.  Capillary return is within normal limits  bilaterally. Temperature is within normal limits  bilaterally.  Neurologic  Senn-Weinstein monofilament wire test diminished  bilaterally. Muscle power within normal limits bilaterally.  Nails Thick disfigured discolored nails with subungual debris  from hallux to fifth toes bilaterally. No evidence of bacterial infection or drainage bilaterally.  Orthopedic  No limitations of motion  feet .  No crepitus or effusions noted.  No bony pathology or digital deformities noted.  HAV  B/L.  Skin  normotropic skin with no porokeratosis noted bilaterally.  No signs of infections or ulcers noted.     Onychomycosis  Pain in right toes  Pain in left toes  Consent was obtained for treatment procedures.   Mechanical debridement of nails 1-5  bilaterally performed with a nail nipper.  Filed with dremel without incident.    Return office visit    10   weeks                  Told patient to return for periodic foot care and evaluation due to potential at risk complications.   Everette Mall DPM  

## 2022-12-01 DIAGNOSIS — E1165 Type 2 diabetes mellitus with hyperglycemia: Secondary | ICD-10-CM | POA: Diagnosis not present

## 2022-12-20 ENCOUNTER — Encounter: Payer: Self-pay | Admitting: "Endocrinology

## 2022-12-20 ENCOUNTER — Other Ambulatory Visit: Payer: Self-pay

## 2022-12-20 ENCOUNTER — Ambulatory Visit (INDEPENDENT_AMBULATORY_CARE_PROVIDER_SITE_OTHER): Payer: Medicare Other | Admitting: "Endocrinology

## 2022-12-20 VITALS — BP 122/72 | HR 95 | Ht 70.0 in | Wt 194.0 lb

## 2022-12-20 DIAGNOSIS — E11649 Type 2 diabetes mellitus with hypoglycemia without coma: Secondary | ICD-10-CM

## 2022-12-20 DIAGNOSIS — Z7984 Long term (current) use of oral hypoglycemic drugs: Secondary | ICD-10-CM | POA: Diagnosis not present

## 2022-12-20 DIAGNOSIS — E782 Mixed hyperlipidemia: Secondary | ICD-10-CM | POA: Diagnosis not present

## 2022-12-20 DIAGNOSIS — Z794 Long term (current) use of insulin: Secondary | ICD-10-CM | POA: Diagnosis not present

## 2022-12-20 DIAGNOSIS — Z7985 Long-term (current) use of injectable non-insulin antidiabetic drugs: Secondary | ICD-10-CM

## 2022-12-20 MED ORDER — SURE COMFORT PEN NEEDLES 32G X 4 MM MISC: 10 refills | Status: DC

## 2022-12-20 MED ORDER — SURE COMFORT PEN NEEDLES 32G X 4 MM MISC
10 refills | Status: DC
  Filled 2022-12-20: qty 100, fill #0

## 2022-12-20 NOTE — Progress Notes (Signed)
Outpatient Endocrinology Note Anthony Cuevas , MD  12/20/22   Anthony Cuevas Jul 13, 1950 [id number]  Referring Provider: Joaquim Nam, MD Primary Care Provider: Joaquim Nam, MD Reason for consultation: Subjective   Assessment & Plan  Diagnoses and all orders for this visit:  Long-term insulin use (HCC)  Uncontrolled type 2 diabetes mellitus with hypoglycemia without coma (HCC) -     Discontinue: Insulin Pen Needle (SURE COMFORT PEN NEEDLES) 32G X 4 MM MISC; USE THREE TIMES A DAY -     Insulin Pen Needle (SURE COMFORT PEN NEEDLES) 32G X 4 MM MISC; USE THREE TIMES A DAY  Long term (current) use of oral hypoglycemic drugs  Long-term (current) use of injectable non-insulin antidiabetic drugs  Mixed hypercholesterolemia and hypertriglyceridemia    Diabetes Type 2 complicated by neuropathy   Lab Results  Component Value Date   GFR 59.36 (L) 10/09/2022   Hba1c goal less than 7, current Hba1c is  Lab Results  Component Value Date   HGBA1C 5.8 (A) 11/21/2022   Will recommend the following: Toujeo 78 units once at night Novolog insulin 30 units before break fast and lunch and 33 units before dinner 15-30 min before meals       Jardiance 25 mg every day  Trulicity 4.5 mg weekly   Follows with foot doctor q3 mo with CA, has a stent one leg and has vascular studies done per patient   No known contraindications/side effects to any of above medications Glucagon ordered with refills on 07/18/22 No history of MEN syndrome/medullary thyroid cancer/pancreatitis or pancreatic cancer in self or family  -Last LD and Tg are as follows: Lab Results  Component Value Date   LDLCALC 52 10/09/2022    Lab Results  Component Value Date   TRIG 195.0 (H) 10/09/2022   -Recommend rosuvastatin 20 mg QD -Follow low fat diet and exercise   -Blood pressure goal <140/90 - Microalbumin/creatinine goal < 30 -Last MA/Cr is as follows: Lab Results  Component Value Date    MICROALBUR 2.8 (H) 10/09/2022   - on ACE/ARB lisinopril 40 mg qd -diet changes including salt restriction -limit eating outside -counseled BP targets per standards of diabetes care -uncontrolled blood pressure can lead to retinopathy, nephropathy and cardiovascular and atherosclerotic heart disease  Reviewed and counseled on: -A1C target -Blood sugar targets -Complications of uncontrolled diabetes  -Checking blood sugar before meals and bedtime and bring log next visit -All medications with mechanism of action and side effects -Hypoglycemia management: rule of 15's, Glucagon Emergency Kit and medical alert ID -low-carb low-fat plate-method diet -At least 20 minutes of physical activity per day -Annual dilated retinal eye exam and foot exam -compliance and follow up needs -follow up as scheduled or earlier if problem gets worse  Call if blood sugar is less than 70 or consistently above 250    Take a 15 gm snack of carbohydrate at bedtime before you go to sleep if your blood sugar is less than 100.    If you are going to fast after midnight for a test or procedure, ask your physician for instructions on how to reduce/decrease your insulin dose.    Call if blood sugar is less than 70 or consistently above 250  -Treating a low sugar by rule of 15  (15 gms of sugar every 15 min until sugar is more than 70) If you feel your sugar is low, test your sugar to be sure If your sugar is low (less than  70), then take 15 grams of a fast acting Carbohydrate (3-4 glucose tablets or glucose gel or 4 ounces of juice or regular soda) Recheck your sugar 15 min after treating low to make sure it is more than 70 If sugar is still less than 70, treat again with 15 grams of carbohydrate          Don't drive the hour of hypoglycemia  If unconscious/unable to eat or drink by mouth, use glucagon injection or nasal spray baqsimi and call 911. Can repeat again in 15 min if still unconscious.  Return in about  2 weeks (around 01/03/2023).   I have reviewed current medications, nurse's notes, allergies, vital signs, past medical and surgical history, family medical history, and social history for this encounter. Counseled patient on symptoms, examination findings, lab findings, imaging results, treatment decisions and monitoring and prognosis. The patient understood the recommendations and agrees with the treatment plan. All questions regarding treatment plan were fully answered.  Anthony Cuevas [place], MD  12/20/22   History of Present Illness Anthony Cuevas is a 72 y.o. year old male who presents for follow up of Type 2 diabetes mellitus.  Anthony Cuevas was first diagnosed in 72.   Diabetes education +  Home diabetes regimen: Toujeo 85 units once at night Novolog insulin 30 units before break fast and lunch and 32 units before dinner 15-30 min before meals       Jardiance 25 mg every day  Trulicity 4.5 mg weekly    Previous history: Non-insulin hypoglycemic drugs previously used: Actos, metformin stopped in 5/23, glipizide Insulin was started in 2006 and has taken Lantus, NovoLog as well as NPH and regular  COMPLICATIONS -  MI/Stroke -  retinopathy +  neuropathy -  nephropathy  BLOOD SUGAR DATA  CGM interpretation: At today's visit, we reviewed her CGM downloads. The full report is  scanned in the media. Reviewing the CGM trends, BG are low across the day, mainly late morning time.   Physical Exam  BP 122/72   Pulse 95   Ht 5\' 10"  (1.778 m)   Wt 194 lb (88 kg)   SpO2 98%   BMI 27.84 kg/m    Constitutional: well developed, well nourished Head: normocephalic, atraumatic Eyes: sclera anicteric, no redness Neck: supple Lungs: normal respiratory effort Neurology: alert and oriented Skin: dry, no appreciable rashes Musculoskeletal: no appreciable defects Psychiatric: normal mood and affect Diabetic Foot Exam - Simple   No data filed      Current Medications Patient's  Medications  New Prescriptions   No medications on file  Previous Medications   ASCORBIC ACID (VITAMIN C) 500 MG TABLET    Take 1 tablet (500 mg total) by mouth daily.   CHOLECALCIFEROL (VITAMIN D3) 125 MCG (5000 UT) CAPS    Take 5,000 Units by mouth 2 (two) times a week.   CLOPIDOGREL (PLAVIX) 75 MG TABLET    Take 1 tablet (75 mg total) by mouth daily.   CONTINUOUS GLUCOSE SENSOR (FREESTYLE LIBRE 3 SENSOR) MISC    Place 1 sensor on the skin every 14 days. Use to check glucose continuously   CYANOCOBALAMIN 1000 MCG TABLET    Take 1,000 mcg by mouth daily.   CYCLOBENZAPRINE (FLEXERIL) 10 MG TABLET    TAKE 1 TABLET TWICE A DAY AS NEEDED FOR MUSCLE SPASMS (SEDATION CAUTION)   DULAGLUTIDE (TRULICITY) 4.5 MG/0.5ML SOPN    Inject 4.5 mg as directed once a week.   GLUCAGON (GVOKE HYPOPEN 1-PACK) 1  MG/0.2ML SOAJ    Inject 1 mg into the skin as needed (low blood sugar with imaopired consciousness).   GLUCOSE BLOOD (ONETOUCH ULTRA) TEST STRIP    USE AS INSTRUCTED TO TEST BLOOD SUGARS 3 OR 4 TIMES DAILY   INSULIN ASPART (NOVOLOG FLEXPEN) 100 UNIT/ML FLEXPEN    INJECT 30 UNITS BEFORE MEALS AS DIRECTED   INSULIN GLARGINE, 2 UNIT DIAL, (TOUJEO MAX SOLOSTAR) 300 UNIT/ML SOLOSTAR PEN    90 units at supper   IRON, FERROUS SULFATE, 325 (65 FE) MG TABS    Take on Sunday and Wednesday.  2 tabs per week.   JARDIANCE 25 MG TABS TABLET    TAKE 1 TABLET DAILY BEFORE BREAKFAST   LATANOPROST (XALATAN) 0.005 % OPHTHALMIC SOLUTION    Place 1 drop into both eyes at bedtime.   LISINOPRIL (ZESTRIL) 40 MG TABLET    Take 1 tablet (40 mg total) by mouth daily.   OMEGA-3 FATTY ACIDS (FISH OIL PO)    Take 1,000 mg by mouth once a week.   ROSUVASTATIN (CRESTOR) 20 MG TABLET    Take 1 tablet (20 mg total) by mouth daily.   TRAMADOL (ULTRAM) 50 MG TABLET    TAKE 1 TABLET THREE TIMES A DAY AS NEEDED FOR MODERATE PAIN  Modified Medications   Modified Medication Previous Medication   INSULIN PEN NEEDLE (SURE COMFORT PEN NEEDLES) 32G  X 4 MM MISC Insulin Pen Needle (SURE COMFORT PEN NEEDLES) 32G X 4 MM MISC      USE THREE TIMES A DAY    USE THREE TIMES A DAY  Discontinued Medications   No medications on file    Allergies No Known Allergies  Past Medical History Past Medical History:  Diagnosis Date   Arthritis    Coronary atherosclerosis    a. 07/2018 CT Chest: 3 vessel coronary atherosclerosis; b. 07/2018 MV: EF 46% (GI uptake artifact), No ischemia/infarct. Mild to mod diffuse Ao atherosclerosis and at least moderate 2 vessel CAD (LAD/RCA) on CT imaging. Low risk.   Depression    improved on sertraline   Diabetes mellitus, type 2 (HCC) 09/1996   GERD (gastroesophageal reflux disease) 1990   History of peptic ulcer disease    Hyperlipidemia 1992   Hypertension 05/2003   Lumbar stenosis L2-3   Muscle atrophy    in arms from degenerative changes in neck   Neck pain    chronic   Smoker    Wears glasses     Past Surgical History Past Surgical History:  Procedure Laterality Date   ABDOMINAL AORTOGRAM W/LOWER EXTREMITY N/A 09/03/2019   Procedure: ABDOMINAL AORTOGRAM W/LOWER EXTREMITY;  Surgeon: Iran Ouch, MD;  Location: [facility];  Service: Cardiovascular;  Laterality: N/A;  Limited Study   CERVICAL DISC SURGERY  04/19/2002   fusion, Dr. Ophelia Charter   CERVICAL DISCECTOMY  01/31/1995   partial   CERVICAL DISCECTOMY  01/30/1998   COLONOSCOPY WITH ESOPHAGOGASTRODUODENOSCOPY (EGD)  06/25/2015   ENTEROSCOPY N/A 07/04/2021   Procedure: ENTEROSCOPY;  Surgeon: Sherrilyn Rist, MD;  Location: [facility];  Service: Gastroenterology;  Laterality: N/A;   HEMORRHOID SURGERY  01/31/1987   HOT HEMOSTASIS N/A 07/04/2021   Procedure: HOT HEMOSTASIS (ARGON PLASMA COAGULATION/BICAP);  Surgeon: Sherrilyn Rist, MD;  Location: [facility];  Service: Gastroenterology;  Laterality: N/A;   KNEE ARTHROPLASTY  11/20/2011   Procedure: COMPUTER ASSISTED TOTAL KNEE ARTHROPLASTY;  Surgeon: Eldred Manges, MD;  Location: [facility]  OR;  Service: Orthopedics;  Laterality:  Left;  Conversion Left Knee Medial Uni to Total Knee Arthroplasty-Cemented   LUMBAR LAMINECTOMY/DECOMPRESSION MICRODISCECTOMY N/A 04/28/2015   Procedure: L2-3 Decompression;  Surgeon: Eldred Manges, MD;  [facility];  Service: Orthopedics;  Laterality: N/A;   MR MRA DUPLICATE EXAM  INACTIVATE     MULTIPLE TOOTH EXTRACTIONS     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 Samuella Cota   STOMACH SURGERY  01/30/1982   [facility], [facility]   SUBMUCOSAL TATTOO INJECTION  07/04/2021   Procedure: SUBMUCOSAL TATTOO INJECTION;  Surgeon: Sherrilyn Rist, MD;  Location: [facility];  Service: Gastroenterology;;    Family History family history includes Breast cancer in his maternal grandmother; Diabetes in his brother, brother, father, mother, and sister; Heart disease in his brother and son; Hypertension in his mother; Lung disease in his paternal grandfather; Stroke in his mother.  Social History Social History   Socioeconomic History   Marital status: Married    Spouse name: Not on file   Number of children: 3   Years of education: Not on file   Highest education level: Not on file  Occupational History   Occupation: Ship broker, retired    Comment: Firefighter  Tobacco Use   Smoking status: Former    Current packs/day: 0.00    Average packs/day: 1 pack/day for 41.0 years (41.0 ttl pk-yrs)    Types: Cigarettes    Start date: 13    Quit date: 2014    Years since quitting: 10.8   Smokeless tobacco: Never  Vaping Use   Vaping status: Never Used  Substance and Sexual Activity   Alcohol use: No    Alcohol/week: 0.0 standard drinks of alcohol   Drug use: Never   Sexual  activity: Not Currently  Other Topics Concern   Not on file  Social History Narrative   Retired Photographer, [employer], aviation fuel, [phone], no known agent orange exposure but 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   Married 1975   3 kids   Social Determinants of Health   Financial Resource Strain: Low Risk  (12/30/2021)   Overall Financial Resource Strain (CARDIA)    Difficulty of Paying Living Expenses: Not very hard  Food Insecurity: No Food Insecurity (12/30/2021)   Hunger Vital Sign    Worried About Running Out of Food in the Last Year: Never true    Ran Out of Food in the Last Year: Never true  Transportation Needs: No Transportation Needs (12/30/2021)   PRAPARE - Administrator, Civil Service (Medical): No    Lack of Transportation (Non-Medical): No  Physical Activity: Insufficiently Active (12/30/2021)   Exercise Vital Sign    Days of Exercise per Week: 2 days    Minutes of Exercise per Session: 20 min  Stress: No Stress Concern Present (12/30/2021)   [employer] of Occupational Health - Occupational Stress Questionnaire    Feeling of Stress : Not at all  Social Connections: Unknown (12/30/2021)   Social Connection and Isolation Panel [NHANES]    Frequency of Communication with Friends and Family: Once a week    Frequency of Social Gatherings with Friends and Family: Twice a week    Attends  Religious Services: Not on file    Active Member of Clubs or Organizations: No    Attends Club or Organization Meetings: Never    Marital Status: Married  Catering manager Violence: Not At Risk (12/30/2021)   Humiliation, Afraid, Rape, and Kick questionnaire    Fear of Current or Ex-Partner: No    Emotionally Abused: No    Physically Abused: No    Sexually Abused: No    Lab Results  Component Value Date   HGBA1C 5.8 (A) 11/21/2022   HGBA1C 6.3 08/14/2022   HGBA1C 7.0 (H) 05/12/2022   Lab Results  Component Value Date   CHOL 124 10/09/2022   Lab Results   Component Value Date   HDL 33.50 (L) 10/09/2022   Lab Results  Component Value Date   LDLCALC 52 10/09/2022   Lab Results  Component Value Date   TRIG 195.0 (H) 10/09/2022   Lab Results  Component Value Date   CHOLHDL 4 10/09/2022   Lab Results  Component Value Date   CREATININE 1.23 10/09/2022   Lab Results  Component Value Date   GFR 59.36 (L) 10/09/2022   Lab Results  Component Value Date   MICROALBUR 2.8 (H) 10/09/2022      Component Value Date/Time   NA 141 10/09/2022 0801   NA 142 08/14/2019 1136   K 4.9 10/09/2022 0801   CL 104 10/09/2022 0801   CO2 31 10/09/2022 0801   GLUCOSE 78 10/09/2022 0801   BUN 20 10/09/2022 0801   BUN 31 (H) 08/14/2019 1136   CREATININE 1.23 10/09/2022 0801   CALCIUM 9.9 10/09/2022 0801   PROT 7.6 10/09/2022 0801   PROT 7.2 09/28/2020 1452   ALBUMIN 4.1 10/09/2022 0801   ALBUMIN 4.1 09/28/2020 1452   AST 27 10/09/2022 0801   ALT 28 10/09/2022 0801   ALKPHOS 64 10/09/2022 0801   BILITOT 0.6 10/09/2022 0801   BILITOT 0.3 09/28/2020 1452   GFRNONAA 63 08/14/2019 1136   GFRAA 73 08/14/2019 1136      Latest Ref Rng & Units 10/09/2022    8:01 AM 08/14/2022   11:00 AM 05/12/2022   10:51 AM  BMP  Glucose 70 - 99 mg/dL 78  54  244   BUN 6 - 23 mg/dL 20  16  20    Creatinine 0.40 - 1.50 mg/dL 0.10  2.72  5.36   Sodium 135 - 145 mEq/L 141  139  142   Potassium 3.5 - 5.1 mEq/L 4.9  4.2  4.6   Chloride 96 - 112 mEq/L 104  104  105   CO2 19 - 32 mEq/L 31  27  25    Calcium 8.4 - 10.5 mg/dL 9.9  9.6  9.6        Component Value Date/Time   WBC 6.2 05/12/2022 1054   RBC 4.85 05/12/2022 1054   HGB 15.8 05/12/2022 1054   HGB 13.6 08/14/2019 1136   HCT 46.7 05/12/2022 1054   HCT 39.4 08/14/2019 1136   PLT 185.0 05/12/2022 1054   PLT 148 (L) 08/14/2019 1136   MCV 96.2 05/12/2022 1054   MCV 98 (H) 08/14/2019 1136   MCH 33.8 (H) 08/14/2019 1136   MCH 21.8 (L) 04/27/2015 1016   MCHC 33.8 05/12/2022 1054   RDW 12.9 05/12/2022 1054    RDW 12.4 08/14/2019 1136   LYMPHSABS 1.3 05/12/2022 1054   MONOABS 0.8 05/12/2022 1054   EOSABS 0.1 05/12/2022 1054   BASOSABS 0.0 05/12/2022 1054     Parts  of this note may have been dictated using voice recognition software. There may be variances in spelling and vocabulary which are unintentional. Not all errors are proofread. Please notify the Thereasa Parkin if any discrepancies are noted or if the meaning of any statement is not clear.

## 2022-12-20 NOTE — Patient Instructions (Signed)

## 2023-01-02 ENCOUNTER — Other Ambulatory Visit: Payer: Self-pay | Admitting: Family Medicine

## 2023-01-02 NOTE — Telephone Encounter (Signed)
Name of Medication: tramadol  Name of Pharmacy: [facility]  Last Fill 08/23/22 #[id number] 1 rf  Last Office Visit and Type: 02/04/22 Next Office Visit and Type: no f/u

## 2023-01-03 NOTE — Telephone Encounter (Signed)
Sent. Thanks.   

## 2023-01-08 ENCOUNTER — Encounter: Payer: Self-pay | Admitting: "Endocrinology

## 2023-01-08 ENCOUNTER — Ambulatory Visit (INDEPENDENT_AMBULATORY_CARE_PROVIDER_SITE_OTHER): Payer: Medicare Other | Admitting: "Endocrinology

## 2023-01-08 VITALS — BP 140/64 | HR 69 | Ht 70.0 in | Wt 191.2 lb

## 2023-01-08 DIAGNOSIS — E782 Mixed hyperlipidemia: Secondary | ICD-10-CM

## 2023-01-08 DIAGNOSIS — Z794 Long term (current) use of insulin: Secondary | ICD-10-CM | POA: Diagnosis not present

## 2023-01-08 DIAGNOSIS — Z7985 Long-term (current) use of injectable non-insulin antidiabetic drugs: Secondary | ICD-10-CM

## 2023-01-08 DIAGNOSIS — Z7984 Long term (current) use of oral hypoglycemic drugs: Secondary | ICD-10-CM | POA: Diagnosis not present

## 2023-01-08 DIAGNOSIS — E11649 Type 2 diabetes mellitus with hypoglycemia without coma: Secondary | ICD-10-CM | POA: Diagnosis not present

## 2023-01-08 MED ORDER — SYNJARDY XR 12.5-1000 MG PO TB24: 2.0000 | ORAL_TABLET | Freq: Every day | ORAL | 4 refills | Status: DC

## 2023-01-08 NOTE — Progress Notes (Signed)
Outpatient Endocrinology Note [place], MD  01/08/23   Anthony Cuevas 03/18/1950 [id number]  Referring Provider: Joaquim Nam, MD Primary Care Provider: Joaquim Nam, MD Reason for consultation: Subjective   Assessment & Plan  Diagnoses and all orders for this visit:  Uncontrolled type 2 diabetes mellitus with hypoglycemia without coma (HCC)  Long-term insulin use (HCC)  Long term (current) use of oral hypoglycemic drugs  Long-term (current) use of injectable non-insulin antidiabetic drugs  Mixed hypercholesterolemia and hypertriglyceridemia  Other orders -     Empagliflozin-metFORMIN HCl ER (SYNJARDY XR) 12.05-998 MG TB24; Take 2 tablets by mouth daily.     Diabetes Type 2 complicated by neuropathy   Lab Results  Component Value Date   GFR 59.36 (L) 10/09/2022   Hba1c goal less than 7, current Hba1c is  Lab Results  Component Value Date   HGBA1C 5.8 (A) 11/21/2022   Will recommend the following: Patient did not make dose changes as prescribed last time  Toujeo 78 units once at night Novolog insulin 30 units before break fast and lunch and 33 units before dinner 15 min before meals       Jardiance 25 mg every day  Trulicity 4.5 mg weekly   Follows with foot doctor q3 mo, has a stent one leg and has vascular studies done per patient   No known contraindications/side effects to any of above medications Glucagon ordered with refills on 07/18/22 No history of MEN syndrome/medullary thyroid cancer/pancreatitis or pancreatic cancer in self or family  -Last LD and Tg are as follows: Lab Results  Component Value Date   LDLCALC 52 10/09/2022    Lab Results  Component Value Date   TRIG 195.0 (H) 10/09/2022   -Recommend rosuvastatin 20 mg QD -Follow low fat diet and exercise   -Blood pressure goal <140/90 - Microalbumin/creatinine goal < 30 -Last MA/Cr is as follows: Lab Results  Component Value Date   MICROALBUR 2.8 (H) 10/09/2022    - on ACE/ARB lisinopril 40 mg qd -diet changes including salt restriction -limit eating outside -counseled BP targets per standards of diabetes care -uncontrolled blood pressure can lead to retinopathy, nephropathy and cardiovascular and atherosclerotic heart disease  Reviewed and counseled on: -A1C target -Blood sugar targets -Complications of uncontrolled diabetes  -Checking blood sugar before meals and bedtime and bring log next visit -All medications with mechanism of action and side effects -Hypoglycemia management: rule of 15's, Glucagon Emergency Kit and medical alert ID -low-carb low-fat plate-method diet -At least 20 minutes of physical activity per day -Annual dilated retinal eye exam and foot exam -compliance and follow up needs -follow up as scheduled or earlier if problem gets worse  Call if blood sugar is less than 70 or consistently above 250    Take a 15 gm snack of carbohydrate at bedtime before you go to sleep if your blood sugar is less than 100.    If you are going to fast after midnight for a test or procedure, ask your physician for instructions on how to reduce/decrease your insulin dose.    Call if blood sugar is less than 70 or consistently above 250  -Treating a low sugar by rule of 15  (15 gms of sugar every 15 min until sugar is more than 70) If you feel your sugar is low, test your sugar to be sure If your sugar is low (less than 70), then take 15 grams of a fast acting Carbohydrate (3-4 glucose tablets  or glucose gel or 4 ounces of juice or regular soda) Recheck your sugar 15 min after treating low to make sure it is more than 70 If sugar is still less than 70, treat again with 15 grams of carbohydrate          Don't drive the hour of hypoglycemia  If unconscious/unable to eat or drink by mouth, use glucagon injection or nasal spray baqsimi and call 911. Can repeat again in 15 min if still unconscious.  Return in about 22 days (around  01/30/2023).   I have reviewed current medications, nurse's notes, allergies, vital signs, past medical and surgical history, family medical history, and social history for this encounter. Counseled patient on symptoms, examination findings, lab findings, imaging results, treatment decisions and monitoring and prognosis. The patient understood the recommendations and agrees with the treatment plan. All questions regarding treatment plan were fully answered.  [name], MD  01/08/23   History of Present Illness JOHNNATHAN Cuevas is a 72 y.o. year old male who presents for follow up of Type 2 diabetes mellitus.  HAWKEYE PINE was first diagnosed in 41.   Diabetes education +  Home diabetes regimen: Toujeo 85 units once at night Novolog insulin 30 units before meals 15 min before meals       Jardiance 25 mg every day  Trulicity 4.5 mg weekly    Previous history: Non-insulin hypoglycemic drugs previously used: Actos, metformin stopped in 5/23, glipizide Insulin was started in 2006 and has taken Lantus, NovoLog as well as NPH and regular  COMPLICATIONS -  MI/Stroke -  retinopathy +  neuropathy -  nephropathy  BLOOD SUGAR DATA  CGM interpretation: At today's visit, we reviewed her CGM downloads. The full report is  scanned in the media. Reviewing the CGM trends, BG are low overnight, and some around afternoon and dinner.  Physical Exam  BP (!) 140/64   Pulse 69   Ht 5\' 10"  (1.778 m)   Wt 191 lb 3.2 oz (86.7 kg)   SpO2 99%   BMI 27.43 kg/m    Constitutional: well developed, well nourished Head: normocephalic, atraumatic Eyes: sclera anicteric, no redness Neck: supple Lungs: normal respiratory effort Neurology: alert and oriented Skin: dry, no appreciable rashes Musculoskeletal: no appreciable defects Psychiatric: normal mood and affect Diabetic Foot Exam - Simple   No data filed      Current Medications Patient's Medications  New Prescriptions    EMPAGLIFLOZIN-METFORMIN HCL ER (SYNJARDY XR) 12.05-998 MG TB24    Take 2 tablets by mouth daily.  Previous Medications   ASCORBIC ACID (VITAMIN C) 500 MG TABLET    Take 1 tablet (500 mg total) by mouth daily.   CHOLECALCIFEROL (VITAMIN D3) 125 MCG (5000 UT) CAPS    Take 5,000 Units by mouth 2 (two) times a week.   CLOPIDOGREL (PLAVIX) 75 MG TABLET    Take 1 tablet (75 mg total) by mouth daily.   CONTINUOUS GLUCOSE SENSOR (FREESTYLE LIBRE 3 SENSOR) MISC    Place 1 sensor on the skin every 14 days. Use to check glucose continuously   CYANOCOBALAMIN 1000 MCG TABLET    Take 1,000 mcg by mouth daily.   CYCLOBENZAPRINE (FLEXERIL) 10 MG TABLET    TAKE 1 TABLET TWICE A DAY AS NEEDED FOR MUSCLE SPASMS (SEDATION CAUTION)   DULAGLUTIDE (TRULICITY) 4.5 MG/0.5ML SOPN    Inject 4.5 mg as directed once a week.   GLUCAGON (GVOKE HYPOPEN 1-PACK) 1 MG/0.2ML SOAJ    Inject  1 mg into the skin as needed (low blood sugar with imaopired consciousness).   GLUCOSE BLOOD (ONETOUCH ULTRA) TEST STRIP    USE AS INSTRUCTED TO TEST BLOOD SUGARS 3 OR 4 TIMES DAILY   INSULIN ASPART (NOVOLOG FLEXPEN) 100 UNIT/ML FLEXPEN    INJECT 30 UNITS BEFORE MEALS AS DIRECTED   INSULIN GLARGINE, 2 UNIT DIAL, (TOUJEO MAX SOLOSTAR) 300 UNIT/ML SOLOSTAR PEN    90 units at supper   INSULIN PEN NEEDLE (SURE COMFORT PEN NEEDLES) 32G X 4 MM MISC    USE THREE TIMES A DAY   IRON, FERROUS SULFATE, 325 (65 FE) MG TABS    Take on Sunday and Wednesday.  2 tabs per week.   LATANOPROST (XALATAN) 0.005 % OPHTHALMIC SOLUTION    Place 1 drop into both eyes at bedtime.   LISINOPRIL (ZESTRIL) 40 MG TABLET    Take 1 tablet (40 mg total) by mouth daily.   OMEGA-3 FATTY ACIDS (FISH OIL PO)    Take 1,000 mg by mouth once a week.   ROSUVASTATIN (CRESTOR) 20 MG TABLET    Take 1 tablet (20 mg total) by mouth daily.   TRAMADOL (ULTRAM) 50 MG TABLET    TAKE 1 TABLET THREE TIMES A DAY AS NEEDED FOR MODERATE PAIN  Modified Medications   No medications on file   Discontinued Medications   JARDIANCE 25 MG TABS TABLET    TAKE 1 TABLET DAILY BEFORE BREAKFAST    Allergies No Known Allergies  Past Medical History Past Medical History:  Diagnosis Date   Arthritis    Coronary atherosclerosis    a. 07/2018 CT Chest: 3 vessel coronary atherosclerosis; b. 07/2018 MV: EF 46% (GI uptake artifact), No ischemia/infarct. Mild to mod diffuse Ao atherosclerosis and at least moderate 2 vessel CAD (LAD/RCA) on CT imaging. Low risk.   Depression    improved on sertraline   Diabetes mellitus, type 2 (HCC) 09/1996   GERD (gastroesophageal reflux disease) 1990   History of peptic ulcer disease    Hyperlipidemia 1992   Hypertension 05/2003   Lumbar stenosis L2-3   Muscle atrophy    in arms from degenerative changes in neck   Neck pain    chronic   Smoker    Wears glasses     Past Surgical History Past Surgical History:  Procedure Laterality Date   ABDOMINAL AORTOGRAM W/LOWER EXTREMITY N/A 09/03/2019   Procedure: ABDOMINAL AORTOGRAM W/LOWER EXTREMITY;  Surgeon: Iran Ouch, MD;  Location: [facility];  Service: Cardiovascular;  Laterality: N/A;  Limited Study   CERVICAL DISC SURGERY  04/19/2002   fusion, Dr. Ophelia Charter   CERVICAL DISCECTOMY  01/31/1995   partial   CERVICAL DISCECTOMY  01/30/1998   COLONOSCOPY WITH ESOPHAGOGASTRODUODENOSCOPY (EGD)  06/25/2015   ENTEROSCOPY N/A 07/04/2021   Procedure: ENTEROSCOPY;  Surgeon: Sherrilyn Rist, MD;  Location: [facility];  Service: Gastroenterology;  Laterality: N/A;   HEMORRHOID SURGERY  01/31/1987   HOT HEMOSTASIS N/A 07/04/2021   Procedure: HOT HEMOSTASIS (ARGON PLASMA COAGULATION/BICAP);  Surgeon: Sherrilyn Rist, MD;  Location: [facility];  Service: Gastroenterology;  Laterality: N/A;   KNEE ARTHROPLASTY  11/20/2011   Procedure: COMPUTER ASSISTED TOTAL KNEE ARTHROPLASTY;  Surgeon: Eldred Manges, MD;  Location: [facility];  Service: Orthopedics;  Laterality: Left;  Conversion Left Knee Medial Uni to  Total Knee Arthroplasty-Cemented   LUMBAR LAMINECTOMY/DECOMPRESSION MICRODISCECTOMY N/A 04/28/2015   Procedure: L2-3 Decompression;  Surgeon: Eldred Manges, MD;  Location: [facility];  Service: Orthopedics;  Laterality: N/A;   MR MRA DUPLICATE EXAM  INACTIVATE     MULTIPLE TOOTH EXTRACTIONS     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 Samuella Cota   STOMACH SURGERY  01/30/1982   [facility], [facility]   SUBMUCOSAL TATTOO INJECTION  07/04/2021   Procedure: SUBMUCOSAL TATTOO INJECTION;  Surgeon: Sherrilyn Rist, MD;  Location: [facility];  Service: Gastroenterology;;    Family History family history includes Breast cancer in his maternal grandmother; Diabetes in his brother, brother, father, mother, and sister; Heart disease in his brother and son; Hypertension in his mother; Lung disease in his paternal grandfather; Stroke in his mother.  Social History Social History   Socioeconomic History   Marital status: Married    Spouse name: Not on file   Number of children: 3   Years of education: Not on file   Highest education level: Not on file  Occupational History   Occupation: Ship broker, retired    Comment: Firefighter  Tobacco Use   Smoking status: Former    Current packs/day: 0.00    Average packs/day: 1 pack/day for 41.0 years (41.0 ttl pk-yrs)    Types: Cigarettes    Start date: 51    Quit date: 2014    Years since quitting: 10.9   Smokeless tobacco: Never  Vaping Use   Vaping status: Never Used  Substance and Sexual Activity   Alcohol use: No    Alcohol/week: 0.0 standard drinks of alcohol   Drug use: Never   Sexual activity: Not Currently  Other Topics Concern   Not on file  Social History  Narrative   Retired Photographer, [employer], aviation fuel, [phone], no known agent orange exposure but 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   Married 1975   3 kids   Social Determinants of Health   Financial Resource Strain: Low Risk  (12/30/2021)   Overall Financial Resource Strain (CARDIA)    Difficulty of Paying Living Expenses: Not very hard  Food Insecurity: No Food Insecurity (12/30/2021)   Hunger Vital Sign    Worried About Running Out of Food in the Last Year: Never true    Ran Out of Food in the Last Year: Never true  Transportation Needs: No Transportation Needs (12/30/2021)   PRAPARE - Administrator, Civil Service (Medical): No    Lack of Transportation (Non-Medical): No  Physical Activity: Insufficiently Active (12/30/2021)   Exercise Vital Sign    Days of Exercise per Week: 2 days    Minutes of Exercise per Session: 20 min  Stress: No Stress Concern Present (12/30/2021)   [employer] of Occupational Health - Occupational Stress Questionnaire    Feeling of Stress : Not at all  Social Connections: Unknown (12/30/2021)   Social Connection and Isolation Panel [NHANES]    Frequency of Communication with Friends and Family: Once a week    Frequency of Social Gatherings with Friends and Family: Twice a week    Attends Religious Services: Not on Marketing executive or Organizations: No    Attends Banker Meetings: Never    Marital Status: Married  Catering manager Violence:  Not At Risk (12/30/2021)   Humiliation, Afraid, Rape, and Kick questionnaire    Fear of Current or Ex-Partner: No    Emotionally Abused: No    Physically Abused: No    Sexually Abused: No    Lab Results  Component Value Date   HGBA1C 5.8 (A) 11/21/2022   HGBA1C 6.3 08/14/2022   HGBA1C 7.0 (H) 05/12/2022   Lab Results  Component Value Date   CHOL 124 10/09/2022   Lab Results  Component Value Date   HDL 33.50 (L) 10/09/2022   Lab Results  Component  Value Date   LDLCALC 52 10/09/2022   Lab Results  Component Value Date   TRIG 195.0 (H) 10/09/2022   Lab Results  Component Value Date   CHOLHDL 4 10/09/2022   Lab Results  Component Value Date   CREATININE 1.23 10/09/2022   Lab Results  Component Value Date   GFR 59.36 (L) 10/09/2022   Lab Results  Component Value Date   MICROALBUR 2.8 (H) 10/09/2022      Component Value Date/Time   NA 141 10/09/2022 0801   NA 142 08/14/2019 1136   K 4.9 10/09/2022 0801   CL 104 10/09/2022 0801   CO2 31 10/09/2022 0801   GLUCOSE 78 10/09/2022 0801   BUN 20 10/09/2022 0801   BUN 31 (H) 08/14/2019 1136   CREATININE 1.23 10/09/2022 0801   CALCIUM 9.9 10/09/2022 0801   PROT 7.6 10/09/2022 0801   PROT 7.2 09/28/2020 1452   ALBUMIN 4.1 10/09/2022 0801   ALBUMIN 4.1 09/28/2020 1452   AST 27 10/09/2022 0801   ALT 28 10/09/2022 0801   ALKPHOS 64 10/09/2022 0801   BILITOT 0.6 10/09/2022 0801   BILITOT 0.3 09/28/2020 1452   GFRNONAA 63 08/14/2019 1136   GFRAA 73 08/14/2019 1136      Latest Ref Rng & Units 10/09/2022    8:01 AM 08/14/2022   11:00 AM 05/12/2022   10:51 AM  BMP  Glucose 70 - 99 mg/dL 78  54  161   BUN 6 - 23 mg/dL 20  16  20    Creatinine 0.40 - 1.50 mg/dL 0.96  0.45  4.09   Sodium 135 - 145 mEq/L 141  139  142   Potassium 3.5 - 5.1 mEq/L 4.9  4.2  4.6   Chloride 96 - 112 mEq/L 104  104  105   CO2 19 - 32 mEq/L 31  27  25    Calcium 8.4 - 10.5 mg/dL 9.9  9.6  9.6        Component Value Date/Time   WBC 6.2 05/12/2022 1054   RBC 4.85 05/12/2022 1054   HGB 15.8 05/12/2022 1054   HGB 13.6 08/14/2019 1136   HCT 46.7 05/12/2022 1054   HCT 39.4 08/14/2019 1136   PLT 185.0 05/12/2022 1054   PLT 148 (L) 08/14/2019 1136   MCV 96.2 05/12/2022 1054   MCV 98 (H) 08/14/2019 1136   MCH 33.8 (H) 08/14/2019 1136   MCH 21.8 (L) 04/27/2015 1016   MCHC 33.8 05/12/2022 1054   RDW 12.9 05/12/2022 1054   RDW 12.4 08/14/2019 1136   LYMPHSABS 1.3 05/12/2022 1054   MONOABS 0.8  05/12/2022 1054   EOSABS 0.1 05/12/2022 1054   BASOSABS 0.0 05/12/2022 1054     Parts of this note may have been dictated using voice recognition software. There may be variances in spelling and vocabulary which are unintentional. Not all errors are proofread. Please notify the Thereasa Parkin if any discrepancies  are noted or if the meaning of any statement is not clear.

## 2023-01-08 NOTE — Patient Instructions (Signed)
Toujeo 78 units once at night Novolog insulin 30 units before break fast and lunch and 33 units before dinner 15 min before meals       Jardiance 25 mg every day  Trulicity 4.5 mg weekly

## 2023-01-22 ENCOUNTER — Ambulatory Visit
Admission: RE | Admit: 2023-01-22 | Discharge: 2023-01-22 | Disposition: A | Discharge status: Home / Self Care | Payer: Medicare Other | Source: Ambulatory Visit | Attending: Internal Medicine | Admitting: Internal Medicine

## 2023-01-22 DIAGNOSIS — Z87891 Personal history of nicotine dependence: Secondary | ICD-10-CM

## 2023-01-22 DIAGNOSIS — H401131 Primary open-angle glaucoma, bilateral, mild stage: Secondary | ICD-10-CM | POA: Diagnosis not present

## 2023-01-22 DIAGNOSIS — Z122 Encounter for screening for malignant neoplasm of respiratory organs: Secondary | ICD-10-CM

## 2023-01-30 ENCOUNTER — Encounter: Payer: Self-pay | Admitting: "Endocrinology

## 2023-01-30 ENCOUNTER — Ambulatory Visit (INDEPENDENT_AMBULATORY_CARE_PROVIDER_SITE_OTHER): Payer: Medicare Other | Admitting: "Endocrinology

## 2023-01-30 VITALS — BP 120/60 | HR 94 | Ht 70.0 in | Wt 195.6 lb

## 2023-01-30 DIAGNOSIS — E782 Mixed hyperlipidemia: Secondary | ICD-10-CM | POA: Diagnosis not present

## 2023-01-30 DIAGNOSIS — Z794 Long term (current) use of insulin: Secondary | ICD-10-CM

## 2023-01-30 DIAGNOSIS — E11649 Type 2 diabetes mellitus with hypoglycemia without coma: Secondary | ICD-10-CM | POA: Diagnosis not present

## 2023-01-30 DIAGNOSIS — Z7985 Long-term (current) use of injectable non-insulin antidiabetic drugs: Secondary | ICD-10-CM | POA: Diagnosis not present

## 2023-01-30 DIAGNOSIS — Z7984 Long term (current) use of oral hypoglycemic drugs: Secondary | ICD-10-CM

## 2023-01-30 DIAGNOSIS — E1165 Type 2 diabetes mellitus with hyperglycemia: Secondary | ICD-10-CM | POA: Diagnosis not present

## 2023-01-30 NOTE — Patient Instructions (Addendum)
 Will recommend the following: Continue Toujeo  78 units once at night Novolog  insulin  28 units before break fast, 28 units before lunch and 30 units before dinner 15 min before meals Start synjardy  12.05/998 once a day->increase to twice a day in one week        Stop Jardiance  25 mg every day  Continue Trulicity  4.5 mg weekly

## 2023-01-30 NOTE — Progress Notes (Signed)
 Outpatient Endocrinology Note [name], MD  01/30/23   Anthony Cuevas 04/24/1950 [id number]  Referring Provider: Cleatus Arlyss RAMAN, MD Primary Care Provider: Cleatus Arlyss RAMAN, MD Reason for consultation: Subjective   Assessment & Plan  Diagnoses and all orders for this visit:  Uncontrolled type 2 diabetes mellitus with hypoglycemia without coma (HCC)  Long-term insulin  use (HCC)  Long term (current) use of oral hypoglycemic drugs  Long-term (current) use of injectable non-insulin  antidiabetic drugs  Mixed hypercholesterolemia and hypertriglyceridemia    Diabetes Type 2 complicated by neuropathy   Lab Results  Component Value Date   GFR 59.36 (L) 10/09/2022   Hba1c goal less than 7, current Hba1c is  Lab Results  Component Value Date   HGBA1C 5.8 (A) 11/21/2022   Will recommend the following: Will recommend the following: Continue Toujeo  78 units once at night Novolog  insulin  28 units before break fast, 28 units before lunch and 30 units before dinner 15 min before meals Start synjardy  12.05/998 once a day->increase to twice a day in one week        Stop Jardiance  25 mg every day  Continue Trulicity  4.5 mg weekly   Follows with foot doctor q3 mo, has a stent one leg and has vascular studies done per patient   No known contraindications/side effects to any of above medications Glucagon  ordered with refills on 07/18/22 No history of MEN syndrome/medullary thyroid  cancer/pancreatitis or pancreatic cancer in self or family  -Last LD and Tg are as follows: Lab Results  Component Value Date   LDLCALC 52 10/09/2022    Lab Results  Component Value Date   TRIG 195.0 (H) 10/09/2022   -Recommend rosuvastatin  20 mg QD -Follow low fat diet and exercise   -Blood pressure goal <140/90 - Microalbumin/creatinine goal < 30 -Last MA/Cr is as follows: Lab Results  Component Value Date   MICROALBUR 2.8 (H) 10/09/2022   - on ACE/ARB lisinopril  40 mg  qd -diet changes including salt restriction -limit eating outside -counseled BP targets per standards of diabetes care -uncontrolled blood pressure can lead to retinopathy, nephropathy and cardiovascular and atherosclerotic heart disease  Reviewed and counseled on: -A1C target -Blood sugar targets -Complications of uncontrolled diabetes  -Checking blood sugar before meals and bedtime and bring log next visit -All medications with mechanism of action and side effects -Hypoglycemia management: rule of 15's, Glucagon  Emergency Kit and medical alert ID -low-carb low-fat plate-method diet -At least 20 minutes of physical activity per day -Annual dilated retinal eye exam and foot exam -compliance and follow up needs -follow up as scheduled or earlier if problem gets worse  Call if blood sugar is less than 70 or consistently above 250    Take a 15 gm snack of carbohydrate at bedtime before you go to sleep if your blood sugar is less than 100.    If you are going to fast after midnight for a test or procedure, ask your physician for instructions on how to reduce/decrease your insulin  dose.    Call if blood sugar is less than 70 or consistently above 250  -Treating a low sugar by rule of 15  (15 gms of sugar every 15 min until sugar is more than 70) If you feel your sugar is low, test your sugar to be sure If your sugar is low (less than 70), then take 15 grams of a fast acting Carbohydrate (3-4 glucose tablets or glucose gel or 4 ounces of juice or regular soda)  Recheck your sugar 15 min after treating low to make sure it is more than 70 If sugar is still less than 70, treat again with 15 grams of carbohydrate          Don't drive the hour of hypoglycemia  If unconscious/unable to eat or drink by mouth, use glucagon  injection or nasal spray baqsimi and call 911. Can repeat again in 15 min if still unconscious.  Return in about 30 days (around 03/01/2023).   I have reviewed current  medications, nurse's notes, allergies, vital signs, past medical and surgical history, family medical history, and social history for this encounter. Counseled patient on symptoms, examination findings, lab findings, imaging results, treatment decisions and monitoring and prognosis. The patient understood the recommendations and agrees with the treatment plan. All questions regarding treatment plan were fully answered.  [name], MD  01/30/23   History of Present Illness Anthony Cuevas is a 72 y.o. year old male who presents for follow up of Type 2 diabetes mellitus.  Anthony Cuevas was first diagnosed in 72.   Diabetes education +  Home diabetes regimen: Toujeo  85 units once at night Novolog  insulin  30 units before meals 15 min before meals       Jardiance  25 mg every day  Trulicity  4.5 mg weekly    Previous history: Non-insulin  hypoglycemic drugs previously used: Actos , metformin  stopped in 5/23, glipizide  Insulin  was started in 2006 and has taken Lantus , NovoLog  as well as NPH and regular  COMPLICATIONS -  MI/Stroke -  retinopathy +  neuropathy -  nephropathy  BLOOD SUGAR DATA  CGM interpretation: At today's visit, we reviewed her CGM downloads. The full report is  scanned in the media. Reviewing the CGM trends, BG are well controlled across the day, with some lows and high.   Physical Exam  BP 120/60   Pulse 94   Ht 5' 10 (1.778 m)   Wt 195 lb 9.6 oz (88.7 kg)   SpO2 97%   BMI 28.07 kg/m    Constitutional: well developed, well nourished Head: normocephalic, atraumatic Eyes: sclera anicteric, no redness Neck: supple Lungs: normal respiratory effort Neurology: alert and oriented Skin: dry, no appreciable rashes Musculoskeletal: no appreciable defects Psychiatric: normal mood and affect Diabetic Foot Exam - Simple   No data filed      Current Medications Patient's Medications  New Prescriptions   No medications on file  Previous Medications    ASCORBIC ACID  (VITAMIN C) 500 MG TABLET    Take 1 tablet (500 mg total) by mouth daily.   CHOLECALCIFEROL  (VITAMIN D3) 125 MCG (5000 UT) CAPS    Take 5,000 Units by mouth 2 (two) times a week.   CLOPIDOGREL  (PLAVIX ) 75 MG TABLET    Take 1 tablet (75 mg total) by mouth daily.   CONTINUOUS GLUCOSE SENSOR (FREESTYLE LIBRE 3 SENSOR) MISC    Place 1 sensor on the skin every 14 days. Use to check glucose continuously   CYANOCOBALAMIN  1000 MCG TABLET    Take 1,000 mcg by mouth daily.   CYCLOBENZAPRINE  (FLEXERIL ) 10 MG TABLET    TAKE 1 TABLET TWICE A DAY AS NEEDED FOR MUSCLE SPASMS (SEDATION CAUTION)   DULAGLUTIDE  (TRULICITY ) 4.5 MG/0.5ML SOPN    Inject 4.5 mg as directed once a week.   EMPAGLIFLOZIN -METFORMIN  HCL ER (SYNJARDY  XR) 12.05-998 MG TB24    Take 2 tablets by mouth daily.   GLUCAGON  (GVOKE HYPOPEN  1-PACK) 1 MG/0.2ML SOAJ    Inject 1 mg into  the skin as needed (low blood sugar with imaopired consciousness).   GLUCOSE BLOOD (ONETOUCH ULTRA) TEST STRIP    USE AS INSTRUCTED TO TEST BLOOD SUGARS 3 OR 4 TIMES DAILY   INSULIN  ASPART (NOVOLOG  FLEXPEN) 100 UNIT/ML FLEXPEN    INJECT 30 UNITS BEFORE MEALS AS DIRECTED   INSULIN  GLARGINE, 2 UNIT DIAL, (TOUJEO  MAX SOLOSTAR) 300 UNIT/ML SOLOSTAR PEN    90 units at supper   INSULIN  PEN NEEDLE (SURE COMFORT PEN NEEDLES) 32G X 4 MM MISC    USE THREE TIMES A DAY   IRON , FERROUS SULFATE , 325 (65 FE) MG TABS    Take on Sunday and Wednesday.  2 tabs per week.   LATANOPROST  (XALATAN ) 0.005 % OPHTHALMIC SOLUTION    Place 1 drop into both eyes at bedtime.   LISINOPRIL  (ZESTRIL ) 40 MG TABLET    Take 1 tablet (40 mg total) by mouth daily.   OMEGA-3 FATTY ACIDS  (FISH OIL PO)    Take 1,000 mg by mouth once a week.   ROSUVASTATIN  (CRESTOR ) 20 MG TABLET    Take 1 tablet (20 mg total) by mouth daily.   TRAMADOL  (ULTRAM ) 50 MG TABLET    TAKE 1 TABLET THREE TIMES A DAY AS NEEDED FOR MODERATE PAIN  Modified Medications   No medications on file  Discontinued Medications   No  medications on file    Allergies No Known Allergies  Past Medical History Past Medical History:  Diagnosis Date   Arthritis    Coronary atherosclerosis    a. 07/2018 CT Chest: 3 vessel coronary atherosclerosis; b. 07/2018 MV: EF 46% (GI uptake artifact), No ischemia/infarct. Mild to mod diffuse Ao atherosclerosis and at least moderate 2 vessel CAD (LAD/RCA) on CT imaging. Low risk.   Depression    improved on sertraline    Diabetes mellitus, type 2 (HCC) 09/1996   GERD (gastroesophageal reflux disease) 1990   History of peptic ulcer disease    Hyperlipidemia 1992   Hypertension 05/2003   Lumbar stenosis L2-3   Muscle atrophy    in arms from degenerative changes in neck   Neck pain    chronic   Smoker    Wears glasses     Past Surgical History Past Surgical History:  Procedure Laterality Date   ABDOMINAL AORTOGRAM W/LOWER EXTREMITY N/A 09/03/2019   Procedure: ABDOMINAL AORTOGRAM W/LOWER EXTREMITY;  Surgeon: Darron Deatrice LABOR, MD;  Location: [facility];  Service: Cardiovascular;  Laterality: N/A;  Limited Study   CERVICAL DISC SURGERY  04/19/2002   fusion, Dr. Barbarann   CERVICAL DISCECTOMY  01/31/1995   partial   CERVICAL DISCECTOMY  01/30/1998   COLONOSCOPY WITH ESOPHAGOGASTRODUODENOSCOPY (EGD)  06/25/2015   ENTEROSCOPY N/A 07/04/2021   Procedure: ENTEROSCOPY;  Surgeon: Legrand Victory LITTIE DOUGLAS, MD;  Location: [facility];  Service: Gastroenterology;  Laterality: N/A;   HEMORRHOID SURGERY  01/31/1987   HOT HEMOSTASIS N/A 07/04/2021   Procedure: HOT HEMOSTASIS (ARGON PLASMA COAGULATION/BICAP);  Surgeon: Legrand Victory LITTIE DOUGLAS, MD;  Location: [facility];  Service: Gastroenterology;  Laterality: N/A;   KNEE ARTHROPLASTY  11/20/2011   Procedure: COMPUTER ASSISTED TOTAL KNEE ARTHROPLASTY;  Surgeon: Oneil JAYSON Barbarann, MD;  Location: [facility];  Service: Orthopedics;  Laterality: Left;  Conversion Left Knee Medial Uni to Total Knee Arthroplasty-Cemented   LUMBAR LAMINECTOMY/DECOMPRESSION  MICRODISCECTOMY N/A 04/28/2015   Procedure: L2-3 Decompression;  Surgeon: Oneil JAYSON Barbarann, MD;  Location: [facility];  Service: Orthopedics;  Laterality: N/A;   MR MRA DUPLICATE EXAM  INACTIVATE  MULTIPLE TOOTH EXTRACTIONS     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 Gretel   STOMACH SURGERY  01/30/1982   [facility], [facility]   SUBMUCOSAL TATTOO INJECTION  07/04/2021   Procedure: SUBMUCOSAL TATTOO INJECTION;  Surgeon: Legrand Victory LITTIE DOUGLAS, MD;  Location: [facility];  Service: Gastroenterology;;    Family History family history includes Breast cancer in his maternal grandmother; Diabetes in his brother, brother, father, mother, and sister; Heart disease in his brother and son; Hypertension in his mother; Lung disease in his paternal grandfather; Stroke in his mother.  Social History Social History   Socioeconomic History   Marital status: Married    Spouse name: Not on file   Number of children: 3   Years of education: Not on file   Highest education level: Not on file  Occupational History   Occupation: ship broker, retired    Comment: firefighter  Tobacco Use   Smoking status: Former    Current packs/day: 0.00    Average packs/day: 1 pack/day for 41.0 years (41.0 ttl pk-yrs)    Types: Cigarettes    Start date: 76    Quit date: 2014    Years since quitting: 11.0   Smokeless tobacco: Never  Vaping Use   Vaping status: Never Used  Substance and Sexual Activity   Alcohol use: No    Alcohol/week: 0.0 standard drinks of alcohol   Drug use: Never   Sexual activity: Not Currently  Other Topics Concern   Not on file  Social History Narrative   Retired Photographer, [employer], aviation fuel, [phone], no known  agent orange exposure but 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   Married 1975   3 kids   Social Drivers of Corporate Investment Banker Strain: Low Risk  (12/30/2021)   Overall Financial Resource Strain (CARDIA)    Difficulty of Paying Living Expenses: Not very hard  Food Insecurity: No Food Insecurity (12/30/2021)   Hunger Vital Sign    Worried About Running Out of Food in the Last Year: Never true    Ran Out of Food in the Last Year: Never true  Transportation Needs: No Transportation Needs (12/30/2021)   PRAPARE - Administrator, Civil Service (Medical): No    Lack of Transportation (Non-Medical): No  Physical Activity: Insufficiently Active (12/30/2021)   Exercise Vital Sign    Days of Exercise per Week: 2 days    Minutes of Exercise per Session: 20 min  Stress: No Stress Concern Present (12/30/2021)   [employer] of Occupational Health - Occupational Stress Questionnaire    Feeling of Stress : Not at all  Social Connections: Unknown (12/30/2021)   Social Connection and Isolation Panel [NHANES]    Frequency of Communication with Friends and Family: Once a week    Frequency of Social Gatherings with Friends and Family: Twice a week    Attends Religious Services: Not on Insurance Claims Handler of Clubs or Organizations: No    Attends Banker Meetings: Never    Marital Status: Married  Catering Manager Violence: Not At Risk (12/30/2021)   Humiliation, Afraid, Rape, and Kick questionnaire    Fear of  Current or Ex-Partner: No    Emotionally Abused: No    Physically Abused: No    Sexually Abused: No    Lab Results  Component Value Date   HGBA1C 5.8 (A) 11/21/2022   HGBA1C 6.3 08/14/2022   HGBA1C 7.0 (H) 05/12/2022   Lab Results  Component Value Date   CHOL 124 10/09/2022   Lab Results  Component Value Date   HDL 33.50 (L) 10/09/2022   Lab Results  Component Value Date   LDLCALC 52 10/09/2022   Lab Results  Component Value Date   TRIG  195.0 (H) 10/09/2022   Lab Results  Component Value Date   CHOLHDL 4 10/09/2022   Lab Results  Component Value Date   CREATININE 1.23 10/09/2022   Lab Results  Component Value Date   GFR 59.36 (L) 10/09/2022   Lab Results  Component Value Date   MICROALBUR 2.8 (H) 10/09/2022      Component Value Date/Time   NA 141 10/09/2022 0801   NA 142 08/14/2019 1136   K 4.9 10/09/2022 0801   CL 104 10/09/2022 0801   CO2 31 10/09/2022 0801   GLUCOSE 78 10/09/2022 0801   BUN 20 10/09/2022 0801   BUN 31 (H) 08/14/2019 1136   CREATININE 1.23 10/09/2022 0801   CALCIUM  9.9 10/09/2022 0801   PROT 7.6 10/09/2022 0801   PROT 7.2 09/28/2020 1452   ALBUMIN 4.1 10/09/2022 0801   ALBUMIN 4.1 09/28/2020 1452   AST 27 10/09/2022 0801   ALT 28 10/09/2022 0801   ALKPHOS 64 10/09/2022 0801   BILITOT 0.6 10/09/2022 0801   BILITOT 0.3 09/28/2020 1452   GFRNONAA 63 08/14/2019 1136   GFRAA 73 08/14/2019 1136      Latest Ref Rng & Units 10/09/2022    8:01 AM 08/14/2022   11:00 AM 05/12/2022   10:51 AM  BMP  Glucose 70 - 99 mg/dL 78  54  885   BUN 6 - 23 mg/dL 20  16  20    Creatinine 0.40 - 1.50 mg/dL 8.76  8.94  8.92   Sodium 135 - 145 mEq/L 141  139  142   Potassium 3.5 - 5.1 mEq/L 4.9  4.2  4.6   Chloride 96 - 112 mEq/L 104  104  105   CO2 19 - 32 mEq/L 31  27  25    Calcium  8.4 - 10.5 mg/dL 9.9  9.6  9.6        Component Value Date/Time   WBC 6.2 05/12/2022 1054   RBC 4.85 05/12/2022 1054   HGB 15.8 05/12/2022 1054   HGB 13.6 08/14/2019 1136   HCT 46.7 05/12/2022 1054   HCT 39.4 08/14/2019 1136   PLT 185.0 05/12/2022 1054   PLT 148 (L) 08/14/2019 1136   MCV 96.2 05/12/2022 1054   MCV 98 (H) 08/14/2019 1136   MCH 33.8 (H) 08/14/2019 1136   MCH 21.8 (L) 04/27/2015 1016   MCHC 33.8 05/12/2022 1054   RDW 12.9 05/12/2022 1054   RDW 12.4 08/14/2019 1136   LYMPHSABS 1.3 05/12/2022 1054   MONOABS 0.8 05/12/2022 1054   EOSABS 0.1 05/12/2022 1054   BASOSABS 0.0 05/12/2022 1054      Parts of this note may have been dictated using voice recognition software. There may be variances in spelling and vocabulary which are unintentional. Not all errors are proofread. Please notify the dino if any discrepancies are noted or if the meaning of any statement is not clear.

## 2023-02-06 ENCOUNTER — Ambulatory Visit: Payer: Medicare Other

## 2023-02-06 ENCOUNTER — Other Ambulatory Visit: Payer: Self-pay

## 2023-02-06 VITALS — BP 122/72 | Ht 70.0 in | Wt 194.2 lb

## 2023-02-06 DIAGNOSIS — Z0000 Encounter for general adult medical examination without abnormal findings: Secondary | ICD-10-CM | POA: Diagnosis not present

## 2023-02-06 DIAGNOSIS — Z87891 Personal history of nicotine dependence: Secondary | ICD-10-CM

## 2023-02-06 DIAGNOSIS — Z122 Encounter for screening for malignant neoplasm of respiratory organs: Secondary | ICD-10-CM

## 2023-02-06 NOTE — Patient Instructions (Signed)
 [name] , Thank you for taking time to come for your Medicare Wellness Visit. I appreciate your ongoing commitment to your health goals. Please review the following plan we discussed and let me know if I can assist you in the future.   Referrals/Orders/Follow-Ups/Clinician Recommendations: none  This is a list of the screening recommended for you and due dates:  Health Maintenance  Topic Date Due   COVID-19 Vaccine (6 - 2024-25 season) 10/01/2022   Hemoglobin A1C  05/22/2023   Eye exam for diabetics  09/25/2023   Yearly kidney function blood test for diabetes  10/09/2023   Yearly kidney health urinalysis for diabetes  10/09/2023   Complete foot exam   11/21/2023   Screening for Lung Cancer  01/22/2024   Medicare Annual Wellness Visit  02/06/2024   Colon Cancer Screening  05/18/2026   DTaP/Tdap/Td vaccine (4 - Td or Tdap) 08/14/2030   Pneumonia Vaccine  Completed   Flu Shot  Completed   Hepatitis C Screening  Completed   Zoster (Shingles) Vaccine  Completed   HPV Vaccine  Aged Out    Advanced directives: (Declined) Advance directive discussed with you today. Even though you declined this today, please call our office should you change your mind, and we can give you the proper paperwork for you to fill out.  Next Medicare Annual Wellness Visit scheduled for next year: Yes 02/07/23 @ 10:50am in person

## 2023-02-06 NOTE — Progress Notes (Signed)
 Subjective:   Anthony Cuevas is a 73 y.o. male who presents for Medicare Annual/Subsequent preventive examination.  Visit Complete: In person  Patient Medicare AWV questionnaire was completed by the patient on 02/02/23; I have confirmed that all information answered by patient is correct and no changes since this date.  Cardiac Risk Factors include: advanced age (>31men, >80 women);diabetes mellitus;dyslipidemia;hypertension;male gender     Objective:    Today's Vitals   02/02/23 0842 02/06/23 1154  BP:  122/72  Weight:  194 lb 3.2 oz (88.1 kg)  Height:  5' 10 (1.778 m)  PainSc: 6  6   PainLoc:  Hand   Body mass index is 27.86 kg/m.     02/06/2023   12:06 PM 12/30/2021   12:47 PM 07/04/2021   10:03 AM 12/07/2020    2:48 PM 12/05/2019    2:50 PM 09/03/2019    6:44 AM 12/04/2018   12:01 PM  Advanced Directives  Does Patient Have a Medical Advance Directive? No No No No No No No  Would patient like information on creating a medical advance directive?  No - Patient declined No - Patient declined Yes (MAU/Ambulatory/Procedural Areas - Information given) Yes (MAU/Ambulatory/Procedural Areas - Information given) No - Patient declined No - Patient declined    Current Medications (verified) Outpatient Encounter Medications as of 02/06/2023  Medication Sig   ascorbic acid  (VITAMIN C) 500 MG tablet Take 1 tablet (500 mg total) by mouth daily.   Cholecalciferol  (VITAMIN D3) 125 MCG (5000 UT) CAPS Take 5,000 Units by mouth 2 (two) times a week.   clopidogrel  (PLAVIX ) 75 MG tablet Take 1 tablet (75 mg total) by mouth daily.   Continuous Glucose Sensor (FREESTYLE LIBRE 3 SENSOR) MISC Place 1 sensor on the skin every 14 days. Use to check glucose continuously   cyanocobalamin  1000 MCG tablet Take 1,000 mcg by mouth daily.   cyclobenzaprine  (FLEXERIL ) 10 MG tablet TAKE 1 TABLET TWICE A DAY AS NEEDED FOR MUSCLE SPASMS (SEDATION CAUTION)   Dulaglutide  (TRULICITY ) 4.5 MG/0.5ML SOPN Inject 4.5 mg  as directed once a week.   Empagliflozin -metFORMIN  HCl ER (SYNJARDY  XR) 12.05-998 MG TB24 Take 2 tablets by mouth daily.   Glucagon  (GVOKE HYPOPEN  1-PACK) 1 MG/0.2ML SOAJ Inject 1 mg into the skin as needed (low blood sugar with imaopired consciousness).   glucose blood (ONETOUCH ULTRA) test strip USE AS INSTRUCTED TO TEST BLOOD SUGARS 3 OR 4 TIMES DAILY   insulin  aspart (NOVOLOG  FLEXPEN) 100 UNIT/ML FlexPen INJECT 30 UNITS BEFORE MEALS AS DIRECTED   insulin  glargine, 2 Unit Dial, (TOUJEO  MAX SOLOSTAR) 300 UNIT/ML Solostar Pen 90 units at supper (Patient taking differently: 85 Units. 85 units at supper)   Insulin  Pen Needle (SURE COMFORT PEN NEEDLES) 32G X 4 MM MISC USE THREE TIMES A DAY   Iron , Ferrous Sulfate , 325 (65 Fe) MG TABS Take on Sunday and Wednesday.  2 tabs per week.   latanoprost  (XALATAN ) 0.005 % ophthalmic solution Place 1 drop into both eyes at bedtime.   lisinopril  (ZESTRIL ) 40 MG tablet Take 1 tablet (40 mg total) by mouth daily.   Omega-3 Fatty Acids  (FISH OIL PO) Take 1,000 mg by mouth once a week.   rosuvastatin  (CRESTOR ) 20 MG tablet Take 1 tablet (20 mg total) by mouth daily.   traMADol  (ULTRAM ) 50 MG tablet TAKE 1 TABLET THREE TIMES A DAY AS NEEDED FOR MODERATE PAIN   No facility-administered encounter medications on file as of 02/06/2023.    Allergies (verified)  Patient has no known allergies.   History: Past Medical History:  Diagnosis Date   Arthritis    Coronary atherosclerosis    a. 07/2018 CT Chest: 3 vessel coronary atherosclerosis; b. 07/2018 MV: EF 46% (GI uptake artifact), No ischemia/infarct. Mild to mod diffuse Ao atherosclerosis and at least moderate 2 vessel CAD (LAD/RCA) on CT imaging. Low risk.   Depression    improved on sertraline    Diabetes mellitus, type 2 (HCC) 09/1996   GERD (gastroesophageal reflux disease) 1990   History of peptic ulcer disease    Hyperlipidemia 1992   Hypertension 05/2003   Lumbar stenosis L2-3   Muscle atrophy    in arms  from degenerative changes in neck   Neck pain    chronic   Smoker    Wears glasses    Past Surgical History:  Procedure Laterality Date   ABDOMINAL AORTOGRAM W/LOWER EXTREMITY N/A 09/03/2019   Procedure: ABDOMINAL AORTOGRAM W/LOWER EXTREMITY;  Surgeon: Darron Deatrice LABOR, MD;  Location: [facility];  Service: Cardiovascular;  Laterality: N/A;  Limited Study   CERVICAL DISC SURGERY  04/19/2002   fusion, Dr. Barbarann   CERVICAL DISCECTOMY  01/31/1995   partial   CERVICAL DISCECTOMY  01/30/1998   COLONOSCOPY WITH ESOPHAGOGASTRODUODENOSCOPY (EGD)  06/25/2015   ENTEROSCOPY N/A 07/04/2021   Procedure: ENTEROSCOPY;  Surgeon: Legrand Victory LITTIE DOUGLAS, MD;  Location: [facility];  Service: Gastroenterology;  Laterality: N/A;   HEMORRHOID SURGERY  01/31/1987   HOT HEMOSTASIS N/A 07/04/2021   Procedure: HOT HEMOSTASIS (ARGON PLASMA COAGULATION/BICAP);  Surgeon: Legrand Victory LITTIE DOUGLAS, MD;  Location: [facility];  Service: Gastroenterology;  Laterality: N/A;   KNEE ARTHROPLASTY  11/20/2011   Procedure: COMPUTER ASSISTED TOTAL KNEE ARTHROPLASTY;  Surgeon: Oneil JAYSON Barbarann, MD;  Location: [facility];  Service: Orthopedics;  Laterality: Left;  Conversion Left Knee Medial Uni to Total Knee Arthroplasty-Cemented   LUMBAR LAMINECTOMY/DECOMPRESSION MICRODISCECTOMY N/A 04/28/2015   Procedure: L2-3 Decompression;  Surgeon: Oneil JAYSON Barbarann, MD;  [facility];  Service: Orthopedics;  Laterality: N/A;   MR MRA DUPLICATE EXAM  INACTIVATE     MULTIPLE TOOTH EXTRACTIONS     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 Gretel   STOMACH SURGERY  01/30/1982   PUD, [facility]   SUBMUCOSAL TATTOO INJECTION  07/04/2021   Procedure: SUBMUCOSAL TATTOO INJECTION;   Surgeon: Legrand Victory LITTIE DOUGLAS, MD;  Location: [facility];  Service: Gastroenterology;;   Family History  Problem Relation Age of Onset   Diabetes Mother    Hypertension Mother    Stroke Mother    Diabetes Father    Diabetes Sister    Diabetes Brother    Diabetes Brother    Heart disease Brother    Breast cancer Maternal Grandmother    Lung disease Paternal Grandfather        black lung   Heart disease Son    Arthritis Neg Hx    Prostate cancer Neg Hx    Colon cancer Neg Hx    Stomach cancer Neg Hx    Social History   Socioeconomic History   Marital status: Married    Spouse name: Not on file   Number of children: 3   Years  of education: Not on file   Highest education level: Not on file  Occupational History   Occupation: ship broker, retired    Comment: firefighter  Tobacco Use   Smoking status: Former    Current packs/day: 0.00    Average packs/day: 1 pack/day for 41.0 years (41.0 ttl pk-yrs)    Types: Cigarettes    Start date: 64    Quit date: 2014    Years since quitting: 11.0   Smokeless tobacco: Never  Vaping Use   Vaping status: Never Used  Substance and Sexual Activity   Alcohol use: No    Alcohol/week: 0.0 standard drinks of alcohol   Drug use: Never   Sexual activity: Not Currently  Other Topics Concern   Not on file  Social History Narrative   Retired Photographer, [employer], aviation fuel, [phone], no known agent orange exposure but 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   Married 1975   3 kids   Social Drivers of Corporate Investment Banker Strain: Low Risk  (02/06/2023)   Overall Financial Resource Strain (CARDIA)    Difficulty of Paying Living Expenses: Not hard at all  Food Insecurity: No Food Insecurity (02/06/2023)   Hunger Vital Sign    Worried About Running Out of Food in the Last Year: Never true    Ran Out of Food in the Last Year: Never true  Transportation Needs: No Transportation Needs (02/06/2023)   PRAPARE - Therapist, Art (Medical): No    Lack of Transportation (Non-Medical): No  Physical Activity: Insufficiently Active (02/06/2023)   Exercise Vital Sign    Days of Exercise per Week: 5 days    Minutes of Exercise per Session: 20 min  Stress: No Stress Concern Present (02/06/2023)   [employer] of Occupational Health - Occupational Stress Questionnaire    Feeling of Stress : Not at all  Social Connections: Moderately Isolated (02/06/2023)   Social Connection and Isolation Panel [NHANES]    Frequency of Communication with Friends and Family: More than three times a week    Frequency of Social Gatherings with Friends and Family: Twice a week    Attends Religious Services: Never    Database Administrator or Organizations: No    Attends Engineer, Structural: Never    Marital Status: Married    Tobacco Counseling Counseling given: Not Answered   Clinical Intake:  Pre-visit preparation completed: Yes  Pain : 0-10 Pain Score: 6  Pain Type: Chronic pain Pain Location: Hand (both hands, neck) Pain Descriptors / Indicators: Aching Pain Onset: More than a month ago Pain Frequency: Intermittent Pain Relieving Factors: medications, heat, moving  Pain Relieving Factors: medications, heat, moving  BMI - recorded: 27.86 Nutritional Status: BMI 25 -29 Overweight Nutritional Risks: None Diabetes: Yes CBG done?: Yes (98 BS right now in office) CBG resulted in Enter/ Edit results?: No Did pt. bring in CBG monitor from home?: No  How often do you need to have someone help you when you read instructions, pamphlets, or other written materials from your doctor or pharmacy?: 1 - Never  Interpreter Needed?: No  Comments: lives with wife Information entered by :: B.Sharone Picchi,LPN   Activities of Daily Living    02/02/2023    8:42 AM 01/08/2023   12:39 PM  In your present state of health, do you have any difficulty performing the following activities:  Hearing? 0 0   Vision? 1 0  Difficulty  concentrating or making decisions? 0 0  Walking or climbing stairs? 0 0  Dressing or bathing? 0 0  Doing errands, shopping? 0 0  Preparing Food and eating ? N N  Using the Toilet? N N  In the past six months, have you accidently leaked urine? N N  Do you have problems with loss of bowel control? N N  Managing your Medications? N N  Managing your Finances? N N  Housekeeping or managing your Housekeeping? N N    Patient Care Team: Cleatus Arlyss RAMAN, MD as PCP - General (Family Medicine) Perla Evalene PARAS, MD as PCP - Cardiology (Cardiology) Barbarann Oneil BROCKS, MD as Consulting Physician (Orthopedic Surgery) Zehr, Harlene BIRCH, PA-C as Physician Assistant (Gastroenterology) Loreda Hacker, DPM as Consulting Physician (Podiatry) Portia Fireman, [place] as Referring Physician (Optometry) Kassie Mallick, MD (Inactive) as Consulting Physician (Endocrinology)  Indicate any recent Medical Services you may have received from other than [facility] providers in the past year (date may be approximate).     Assessment:   This is a routine wellness examination for [place].  Hearing/Vision screen Hearing Screening - Comments:: Pt says his hearing is good Vision Screening - Comments:: Pt says has cataract in left eye (blurry at times): has surgical consult in March 25 Wear glasses-[facility]   Goals Addressed             This Visit's Progress    Follow up with Primary Care Provider   On track    Follow up as needed-02/06/23 would like to continue Weight about 185lb; portion control meals and walk     COMPLETED: Increase physical activity   On track    Starting 11/26/2017, I will continue to walk at least 30 min 2 days per week as tolerated.      COMPLETED: Patient Stated   On track    12/04/2018, I will maintain and continue medications as prescribed.      COMPLETED: Patient Stated       12/05/2019, I will continue to walk 2 days a week for about 30 minutes.      COMPLETED: Patient  Stated   On track    Would like to decrease A1C. 7.1 to 6.2       Depression Screen    02/06/2023   12:01 PM 11/13/2022    8:59 AM 02/14/2022   11:02 AM 12/30/2021   12:44 PM 12/07/2020    2:51 PM 12/05/2019    2:52 PM 12/04/2018   12:02 PM  PHQ 2/9 Scores  PHQ - 2 Score 0 0 0 0 0 0 0  PHQ- 9 Score  0 0   0 0    Fall Risk    02/02/2023    8:42 AM 01/08/2023   12:39 PM 11/13/2022    8:59 AM 02/14/2022   11:02 AM 12/30/2021   12:47 PM  Fall Risk   Falls in the past year? 0 0 0 0 0  Number falls in past yr: 0 0 0 0   Injury with Fall? 0 0 0 0   Risk for fall due to : No Fall Risks  No Fall Risks No Fall Risks   Follow up Education provided;Falls prevention discussed  Falls evaluation completed Falls evaluation completed Falls evaluation completed    MEDICARE RISK AT HOME: Medicare Risk at Home Any stairs in or around the home?: (Patient-Rptd) Yes If so, are there any without handrails?: (Patient-Rptd) No Home free of loose throw rugs in walkways, pet  beds, electrical cords, etc?: (Patient-Rptd) No Adequate lighting in your home to reduce risk of falls?: (Patient-Rptd) No Life alert?: (Patient-Rptd) No Use of a cane, walker or w/c?: (Patient-Rptd) No Grab bars in the bathroom?: (Patient-Rptd) No Shower chair or bench in shower?: (Patient-Rptd) No Elevated toilet seat or a handicapped toilet?: (Patient-Rptd) No  TIMED UP AND GO:  Was the test performed?  Yes  Length of time to ambulate 10 feet: 10 sec Gait steady and fast without use of assistive device    Cognitive Function:    12/05/2019    2:53 PM 12/04/2018   12:04 PM 11/26/2017    9:13 AM 11/23/2016    9:20 AM 08/06/2015    1:15 PM  MMSE - Mini Mental State Exam  Orientation to time 5 5 5 5 5   Orientation to Place 5 5 5 5 5   Registration 3 3 3 3 3   Attention/ Calculation 5 4 0 0 0  Recall 3 3 3 2 3   Recall-comments    unable to recall 1 of 3 words   Language- name 2 objects   0 0 0  Language- repeat 1 1 1 1 1    Language- follow 3 step command   3 3 3   Language- read & follow direction   0 0 0  Write a sentence   0 0 0  Copy design   0 0 0  Total score   20 19 20         02/06/2023   12:10 PM 12/30/2021    1:02 PM  6CIT Screen  What Year? 0 points 0 points  What month? 0 points 0 points  What time? 0 points 0 points  Count back from 20 0 points   Months in reverse 0 points 0 points  Repeat phrase 0 points   Total Score 0 points     Immunizations Immunization History  Administered Date(s) Administered   Fluad Quad(high Dose 65+) 10/02/2018, 10/16/2019, 01/03/2021, 11/03/2021   Fluad Trivalent(High Dose 65+) 11/13/2022   H1N1 01/30/2008   Influenza Split 12/08/2010, 10/20/2011   Influenza Whole 12/03/2008   Influenza,inj,Quad PF,6+ Mos 12/06/2012, 12/15/2013, 11/17/2014, 11/16/2015, 11/23/2016, 11/26/2017   PFIZER(Purple Top)SARS-COV-2 Vaccination 03/30/2019, 04/21/2019, 10/29/2019   Pfizer Covid Bivalent Pediatric Vaccine(49mos to <83yrs) 01/08/2021   Pfizer(Comirnaty)Fall Seasonal Vaccine 12 years and older 11/27/2021   Pneumococcal Conjugate-13 11/26/2017   Pneumococcal Polysaccharide-23 06/12/2013, 12/06/2018   Td 09/06/2001   Tdap 12/07/2011, 08/13/2020   Zoster Recombinant(Shingrix) 11/27/2021, 02/01/2022    TDAP status: Up to date  Flu Vaccine status: Up to date  Pneumococcal vaccine status: Up to date  Covid-19 vaccine status: Completed vaccines  Qualifies for Shingles Vaccine? Yes   Zostavax completed Yes   Shingrix Completed?: Yes  Screening Tests Health Maintenance  Topic Date Due   COVID-19 Vaccine (6 - 2024-25 season) 04/30/2023 (Originally 10/01/2022)   HEMOGLOBIN A1C  05/22/2023   OPHTHALMOLOGY EXAM  09/25/2023   Diabetic kidney evaluation - eGFR measurement  10/09/2023   Diabetic kidney evaluation - Urine ACR  10/09/2023   FOOT EXAM  11/21/2023   Lung Cancer Screening  01/22/2024   Medicare Annual Wellness (AWV)  02/06/2024   Colonoscopy  05/18/2026    DTaP/Tdap/Td (4 - Td or Tdap) 08/14/2030   Pneumonia Vaccine 101+ Years old  Completed   INFLUENZA VACCINE  Completed   Hepatitis C Screening  Completed   Zoster Vaccines- Shingrix  Completed   HPV VACCINES  Aged Out    Health Maintenance  There are no preventive care reminders to display for this patient.   Colorectal cancer screening: Type of screening: Colonoscopy. Completed 05/17/2021. Repeat every 5 years  Lung Cancer Screening: (Low Dose CT Chest recommended if Age 60-80 years, 20 pack-year currently smoking OR have quit w/in 15years.) does qualify. Done 01/22/23  Lung Cancer Screening Referral: no  Additional Screening:  Hepatitis C Screening: does not qualify; Completed 02/12/2015  Vision Screening: Recommended annual ophthalmology exams for early detection of glaucoma and other disorders of the eye. Is the patient up to date with their annual eye exam?  Yes  Who is the provider or what is the name of the office in which the patient attends annual eye exams? [facility] If pt is not established with a provider, would they like to be referred to a provider to establish care? No .   Dental Screening: Recommended annual dental exams for proper oral hygiene  Diabetic Foot Exam: Diabetic Foot Exam: Completed 01/30/2023  Community Resource Referral / Chronic Care Management: CRR required this visit?  No   CCM required this visit?  Appt scheduled with PCP    Plan:     I have personally reviewed and noted the following in the patient's chart:   Medical and social history Use of alcohol, tobacco or illicit drugs  Current medications and supplements including opioid prescriptions. Patient is not currently taking opioid prescriptions. Functional ability and status Nutritional status Physical activity Advanced directives List of other physicians Hospitalizations, surgeries, and ER visits in previous 12 months Vitals Screenings to include cognitive, depression, and  falls Referrals and appointments  In addition, I have reviewed and discussed with patient certain preventive protocols, quality metrics, and best practice recommendations. A written personalized care plan for preventive services as well as general preventive health recommendations were provided to patient.    Erminio LITTIE Saris, LPN   08/31/7972   After Visit Summary: (MyChart) Due to this being a telephonic visit, the after visit summary with patients personalized plan was offered to patient via MyChart   Nurse Notes: Pt says he is doing well. He just needs a refill of some of his medications. Appt made with PCP for refills.

## 2023-02-07 ENCOUNTER — Ambulatory Visit (INDEPENDENT_AMBULATORY_CARE_PROVIDER_SITE_OTHER): Payer: Medicare Other | Admitting: Podiatry

## 2023-02-07 ENCOUNTER — Encounter: Payer: Self-pay | Admitting: Podiatry

## 2023-02-07 VITALS — Ht 70.0 in | Wt 194.2 lb

## 2023-02-07 DIAGNOSIS — E119 Type 2 diabetes mellitus without complications: Secondary | ICD-10-CM

## 2023-02-07 DIAGNOSIS — B351 Tinea unguium: Secondary | ICD-10-CM | POA: Diagnosis not present

## 2023-02-07 DIAGNOSIS — M79676 Pain in unspecified toe(s): Secondary | ICD-10-CM

## 2023-02-07 NOTE — Progress Notes (Signed)
This patient returns to my office for at risk foot care.  This patient requires this care by a professional since this patient will be at risk due to having type 2 diabetes.  This patient is unable to cut nails himself since the patient cannot reach his nails.These nails are painful walking and wearing shoes.  This patient presents for at risk foot care today.  General Appearance  Alert, conversant and in no acute stress.  Vascular  Dorsalis pedis and posterior tibial  pulses are palpable  bilaterally.  Capillary return is within normal limits  bilaterally. Temperature is within normal limits  bilaterally.  Neurologic  Senn-Weinstein monofilament wire test diminished  bilaterally. Muscle power within normal limits bilaterally.  Nails Thick disfigured discolored nails with subungual debris  from hallux to fifth toes bilaterally. No evidence of bacterial infection or drainage bilaterally.  Orthopedic  No limitations of motion  feet .  No crepitus or effusions noted.  No bony pathology or digital deformities noted.  HAV  B/L.  Skin  normotropic skin with no porokeratosis noted bilaterally.  No signs of infections or ulcers noted.     Onychomycosis  Pain in right toes  Pain in left toes  Consent was obtained for treatment procedures.   Mechanical debridement of nails 1-5  bilaterally performed with a nail nipper.  Filed with dremel without incident.    Return office visit    10   weeks                  Told patient to return for periodic foot care and evaluation due to potential at risk complications.   Everette Mall DPM  

## 2023-02-12 ENCOUNTER — Encounter: Payer: Self-pay | Admitting: Family Medicine

## 2023-02-12 ENCOUNTER — Ambulatory Visit (INDEPENDENT_AMBULATORY_CARE_PROVIDER_SITE_OTHER): Payer: Medicare Other | Admitting: Family Medicine

## 2023-02-12 VITALS — BP 116/68 | HR 87 | Temp 98.3°F | Ht 70.0 in | Wt 192.6 lb

## 2023-02-12 DIAGNOSIS — M25519 Pain in unspecified shoulder: Secondary | ICD-10-CM | POA: Diagnosis not present

## 2023-02-12 DIAGNOSIS — D509 Iron deficiency anemia, unspecified: Secondary | ICD-10-CM

## 2023-02-12 DIAGNOSIS — E11649 Type 2 diabetes mellitus with hypoglycemia without coma: Secondary | ICD-10-CM

## 2023-02-12 DIAGNOSIS — E119 Type 2 diabetes mellitus without complications: Secondary | ICD-10-CM

## 2023-02-12 LAB — CBC WITH DIFFERENTIAL/PLATELET
Basophils Absolute: 0 10*3/uL (ref 0.0–0.1)
Basophils Relative: 0.7 % (ref 0.0–3.0)
Eosinophils Absolute: 0.2 10*3/uL (ref 0.0–0.7)
Eosinophils Relative: 2.4 % (ref 0.0–5.0)
HCT: 50.4 % (ref 39.0–52.0)
Hemoglobin: 16.6 g/dL (ref 13.0–17.0)
Lymphocytes Relative: 18.5 % (ref 12.0–46.0)
Lymphs Abs: 1.3 10*3/uL (ref 0.7–4.0)
MCHC: 33 g/dL (ref 30.0–36.0)
MCV: 98 fL (ref 78.0–100.0)
Monocytes Absolute: 0.9 10*3/uL (ref 0.1–1.0)
Monocytes Relative: 12.8 % — ABNORMAL HIGH (ref 3.0–12.0)
Neutro Abs: 4.8 10*3/uL (ref 1.4–7.7)
Neutrophils Relative %: 65.6 % (ref 43.0–77.0)
Platelets: 196 10*3/uL (ref 150.0–400.0)
RBC: 5.14 Mil/uL (ref 4.22–5.81)
RDW: 13 % (ref 11.5–15.5)
WBC: 7.3 10*3/uL (ref 4.0–10.5)

## 2023-02-12 LAB — IRON: Iron: 110 ug/dL (ref 42–165)

## 2023-02-12 MED ORDER — TRAMADOL HCL 50 MG PO TABS: ORAL_TABLET | ORAL | 1 refills | Status: DC

## 2023-02-12 MED ORDER — SURE COMFORT PEN NEEDLES 32G X 4 MM MISC: 3 refills | Status: DC

## 2023-02-12 MED ORDER — TOUJEO MAX SOLOSTAR 300 UNIT/ML ~~LOC~~ SOPN: 78.0000 [IU] | PEN_INJECTOR | Freq: Every day | SUBCUTANEOUS | Status: DC

## 2023-02-12 MED ORDER — CYCLOBENZAPRINE HCL 10 MG PO TABS: 10.0000 mg | ORAL_TABLET | Freq: Two times a day (BID) | ORAL | 3 refills | Status: DC | PRN

## 2023-02-12 MED ORDER — NOVOLOG FLEXPEN 100 UNIT/ML ~~LOC~~ SOPN: 28.0000 [IU] | PEN_INJECTOR | Freq: Three times a day (TID) | SUBCUTANEOUS | Status: DC

## 2023-02-12 NOTE — Patient Instructions (Signed)
Keep going as is.   Update me as needed.  Take care.  Glad to see you. 

## 2023-02-12 NOTE — Progress Notes (Signed)
 Taking tramadol  BID with occ 3rd tab at night, about 50% of the time. It helps with joint pain. Still taking flexeril  for muscle spasms.  No ADE on med.    Most pain is in the shoulders.  Tramadol  helps some of the time, decreases but doesn't eliminate the pain.  No ADE on med. Pain level is activity depending, more active----> more pain.  Taking tylenol  prn.    Vaccines up to date.  He has had f/u re: lung cancer screening.    He had normal ABIs.    Discussed that he may be able to get pen needles without insurance coverage- that may be cheaper.  Rx printed for patient.  He is still on iron  twice per week.  H/o IDA.  Recheck labs pending. No ADE on med.    Meds, vitals, and allergies reviewed.   ROS: Per HPI unless specifically indicated in ROS section   Nad Ncat Neck supple, no LA Rrr Ctab Abd soft, not ttp B shoulder pain with overhead movement and int rotation.  He has loss of muscle mass in the B upper arms at baseline.  Seb cyst cyst on the R upper shoulder/back has healed.

## 2023-02-14 NOTE — Assessment & Plan Note (Signed)
 Discussed that he may be able to get pen needles without insurance coverage- that may be cheaper.  Rx printed for patient.

## 2023-02-14 NOTE — Assessment & Plan Note (Signed)
 H/o IDA, He is still on iron  twice per week.  Recheck labs pending. No ADE on med.  Continue iron  as is.

## 2023-02-14 NOTE — Assessment & Plan Note (Signed)
 Continue tramadol  and flexeril .  He'll update me as needed.

## 2023-02-16 ENCOUNTER — Other Ambulatory Visit: Payer: Self-pay | Admitting: Family Medicine

## 2023-02-16 ENCOUNTER — Telehealth: Payer: Self-pay | Admitting: Cardiovascular Disease

## 2023-02-16 DIAGNOSIS — E785 Hyperlipidemia, unspecified: Secondary | ICD-10-CM

## 2023-02-16 MED ORDER — LISINOPRIL 40 MG PO TABS: 40.0000 mg | ORAL_TABLET | Freq: Every day | ORAL | 2 refills | Status: DC

## 2023-02-16 MED ORDER — ROSUVASTATIN CALCIUM 20 MG PO TABS: 20.0000 mg | ORAL_TABLET | Freq: Every day | ORAL | 2 refills | Status: DC

## 2023-02-16 MED ORDER — CLOPIDOGREL BISULFATE 75 MG PO TABS: 75.0000 mg | ORAL_TABLET | Freq: Every day | ORAL | 2 refills | Status: DC

## 2023-02-16 MED ORDER — IRON (FERROUS SULFATE) 325 (65 FE) MG PO TABS: ORAL_TABLET | ORAL | 0 refills | Status: DC

## 2023-02-16 NOTE — Telephone Encounter (Signed)
*  STAT* If patient is at the pharmacy, call can be transferred to refill team.   1. Which medications need to be refilled? (please list name of each medication and dose if known)   clopidogrel (PLAVIX) 75 MG tablet    lisinopril (ZESTRIL) 40 MG tablet    rosuvastatin (CRESTOR) 20 MG tablet     2. Would you like to learn more about the convenience, safety, & potential cost savings by using the [facility]? No   3. Are you open to using the [facility] (Type [facility]. ) No   4. Which pharmacy/location (including street and city if local pharmacy) is medication to be sent to?[facility] - [place] ([place]), [place] - [address]    5. Do they need a 30 day or 90 day supply? 90 day

## 2023-02-16 NOTE — Telephone Encounter (Signed)
Copied from CRM [id number]. Topic: Clinical - Medication Refill >> Feb 16, 2023 11:13 AM Irine Seal wrote: Most Recent Primary Care Visit:  Provider: Joaquim Nam  Department: [facility]  Visit Type: OFFICE VISIT  Date: 02/12/2023  Medication:   traMADol (ULTRAM) 50 MG tablet [[id number]]  cyclobenzaprine (FLEXERIL) 10 MG tablet [[id number]]  Iron, Ferrous Sulfate, 325 (65 Fe) MG TABS [[id number]]  Has the patient contacted their pharmacy? Yes (Agent: If no, request that the patient contact the pharmacy for the refill. If patient does not wish to contact the pharmacy document the reason why and proceed with request.) (Agent: If yes, when and what did the pharmacy advise?) [facility] is no longer covering medication   Is this the correct pharmacy for this prescription? No If no, delete pharmacy and type the correct one.  This is the patient's preferred pharmacy:   [facility]   Has the prescription been filled recently? Yes  Is the patient out of the medication? No  Has the patient been seen for an appointment in the last year OR does the patient have an upcoming appointment? Yes  Can we respond through MyChart? Yes  Agent: Please be advised that Rx refills may take up to 3 business days. We ask that you follow-up with your pharmacy.

## 2023-02-16 NOTE — Telephone Encounter (Signed)
Requested Prescriptions   Signed Prescriptions Disp Refills   clopidogrel (PLAVIX) 75 MG tablet 90 tablet 2    Sig: Take 1 tablet (75 mg total) by mouth daily.    Authorizing Provider: Antonieta Iba    Ordering User: Feliberto Harts L   lisinopril (ZESTRIL) 40 MG tablet 90 tablet 2    Sig: Take 1 tablet (40 mg total) by mouth daily.    Authorizing Provider: Antonieta Iba    Ordering User: Guerry Minors   rosuvastatin (CRESTOR) 20 MG tablet 90 tablet 2    Sig: Take 1 tablet (20 mg total) by mouth daily.    Authorizing Provider: Antonieta Iba    Ordering User: Guerry Minors   Last office visit: 11/08/22 with plan to f/u 12 months. next office visit: none/active recall

## 2023-02-18 MED ORDER — TRAMADOL HCL 50 MG PO TABS: ORAL_TABLET | ORAL | 1 refills | Status: AC

## 2023-02-18 MED ORDER — IRON (FERROUS SULFATE) 325 (65 FE) MG PO TABS: ORAL_TABLET | ORAL | 3 refills | Status: DC

## 2023-02-18 MED ORDER — CYCLOBENZAPRINE HCL 10 MG PO TABS: 10.0000 mg | ORAL_TABLET | Freq: Two times a day (BID) | ORAL | 3 refills | Status: DC | PRN

## 2023-02-18 NOTE — Telephone Encounter (Signed)
Sent. Thanks.   

## 2023-03-01 ENCOUNTER — Encounter: Payer: Self-pay | Admitting: "Endocrinology

## 2023-03-01 ENCOUNTER — Ambulatory Visit (INDEPENDENT_AMBULATORY_CARE_PROVIDER_SITE_OTHER): Payer: Medicare Other | Admitting: "Endocrinology

## 2023-03-01 VITALS — BP 140/82 | HR 101 | Wt 197.0 lb

## 2023-03-01 DIAGNOSIS — E11649 Type 2 diabetes mellitus with hypoglycemia without coma: Secondary | ICD-10-CM

## 2023-03-01 DIAGNOSIS — Z7985 Long-term (current) use of injectable non-insulin antidiabetic drugs: Secondary | ICD-10-CM | POA: Diagnosis not present

## 2023-03-01 DIAGNOSIS — Z794 Long term (current) use of insulin: Secondary | ICD-10-CM | POA: Diagnosis not present

## 2023-03-01 DIAGNOSIS — Z7984 Long term (current) use of oral hypoglycemic drugs: Secondary | ICD-10-CM | POA: Diagnosis not present

## 2023-03-01 DIAGNOSIS — E782 Mixed hyperlipidemia: Secondary | ICD-10-CM

## 2023-03-01 LAB — POCT GLYCOSYLATED HEMOGLOBIN (HGB A1C): Hemoglobin A1C: 6.3 % — AB (ref 4.0–5.6)

## 2023-03-01 MED ORDER — TRULICITY 4.5 MG/0.5ML ~~LOC~~ SOAJ: 4.5000 mg | SUBCUTANEOUS | 1 refills | Status: DC

## 2023-03-01 MED ORDER — NOVOLOG FLEXPEN 100 UNIT/ML ~~LOC~~ SOPN: 30.0000 [IU] | PEN_INJECTOR | Freq: Three times a day (TID) | SUBCUTANEOUS | 3 refills | Status: DC

## 2023-03-01 MED ORDER — TOUJEO MAX SOLOSTAR 300 UNIT/ML ~~LOC~~ SOPN: 70.0000 [IU] | PEN_INJECTOR | Freq: Every day | SUBCUTANEOUS | 2 refills | Status: DC

## 2023-03-01 NOTE — Patient Instructions (Signed)

## 2023-03-01 NOTE — Progress Notes (Signed)
Outpatient Endocrinology Note [place], MD  03/01/23   COVEY BALLER May 22, 1950 [id number]  Referring Provider: Joaquim Nam, MD Primary Care Provider: Joaquim Nam, MD Reason for consultation: Subjective   Assessment & Plan  Diagnoses and all orders for this visit:  Uncontrolled type 2 diabetes mellitus with hypoglycemia without coma (HCC) -     POCT glycosylated hemoglobin (Hb A1C) -     Dulaglutide (TRULICITY) 4.5 MG/0.5ML SOAJ; Inject 4.5 mg as directed once a week.  Long term (current) use of oral hypoglycemic drugs  Long-term (current) use of injectable non-insulin antidiabetic drugs  Long-term insulin use (HCC)  Mixed hypercholesterolemia and hypertriglyceridemia  Other orders -     insulin glargine, 2 Unit Dial, (TOUJEO MAX SOLOSTAR) 300 UNIT/ML Solostar Pen; Inject 70 Units into the skin daily. -     insulin aspart (NOVOLOG FLEXPEN) 100 UNIT/ML FlexPen; Inject 30 Units into the skin 3 (three) times daily with meals.    Diabetes Type 2 complicated by neuropathy   Lab Results  Component Value Date   GFR 59.36 (L) 10/09/2022   Hba1c goal less than 7, current Hba1c is  Lab Results  Component Value Date   HGBA1C 6.3 (A) 03/01/2023   Will recommend the following: Decrease to Toujeo 70 units once at night Novolog insulin 30 units before break fast, 30 units before lunch and 30 units before dinner 15 min before meals Synjardy 12.05/998 once a day->increase to twice a day in one week        Stop Jardiance 25 mg every day  Continue Trulicity 4.5 mg weekly   S/p nutrition therapy Follows with podiatry Patria Mane at foot and ankle, has a stent one leg and had vascular studies done per patient   No known contraindications/side effects to any of above medications Glucagon ordered with refills on 07/18/22 No history of MEN syndrome/medullary thyroid cancer/pancreatitis or pancreatic cancer in self or family  -Last LD and Tg are as  follows: Lab Results  Component Value Date   LDLCALC 52 10/09/2022    Lab Results  Component Value Date   TRIG 195.0 (H) 10/09/2022   -Recommend rosuvastatin 20 mg QD -Follow low fat diet and exercise   -Blood pressure goal <140/90 - Microalbumin/creatinine goal < 30 -Last MA/Cr is as follows: Lab Results  Component Value Date   MICROALBUR 2.8 (H) 10/09/2022   - on ACE/ARB lisinopril 40 mg qd -diet changes including salt restriction -limit eating outside -counseled BP targets per standards of diabetes care -uncontrolled blood pressure can lead to retinopathy, nephropathy and cardiovascular and atherosclerotic heart disease  Reviewed and counseled on: -A1C target -Blood sugar targets -Complications of uncontrolled diabetes  -Checking blood sugar before meals and bedtime and bring log next visit -All medications with mechanism of action and side effects -Hypoglycemia management: rule of 15's, Glucagon Emergency Kit and medical alert ID -low-carb low-fat plate-method diet -At least 20 minutes of physical activity per day -Annual dilated retinal eye exam and foot exam -compliance and follow up needs -follow up as scheduled or earlier if problem gets worse  Call if blood sugar is less than 70 or consistently above 250    Take a 15 gm snack of carbohydrate at bedtime before you go to sleep if your blood sugar is less than 100.    If you are going to fast after midnight for a test or procedure, ask your physician for instructions on how to reduce/decrease your insulin dose.  Call if blood sugar is less than 70 or consistently above 250  -Treating a low sugar by rule of 15  (15 gms of sugar every 15 min until sugar is more than 70) If you feel your sugar is low, test your sugar to be sure If your sugar is low (less than 70), then take 15 grams of a fast acting Carbohydrate (3-4 glucose tablets or glucose gel or 4 ounces of juice or regular soda) Recheck your sugar 15 min  after treating low to make sure it is more than 70 If sugar is still less than 70, treat again with 15 grams of carbohydrate          Don't drive the hour of hypoglycemia  If unconscious/unable to eat or drink by mouth, use glucagon injection or nasal spray baqsimi and call 911. Can repeat again in 15 min if still unconscious.  Return in about 4 weeks (around 03/29/2023).   I have reviewed current medications, nurse's notes, allergies, vital signs, past medical and surgical history, family medical history, and social history for this encounter. Counseled patient on symptoms, examination findings, lab findings, imaging results, treatment decisions and monitoring and prognosis. The patient understood the recommendations and agrees with the treatment plan. All questions regarding treatment plan were fully answered.  Anthony [place], MD  03/01/23   History of Present Illness Anthony Cuevas is a 73 y.o. year old male who presents for follow up of Type 2 diabetes mellitus.  Anthony Cuevas was first diagnosed in 63.   Diabetes education +  Home diabetes regimen: Toujeo 78 units once at night Novolog insulin 30 units before meals 15 min before meals       Jardiance 25 mg every day  Trulicity 4.5 mg weekly    Previous history: Non-insulin hypoglycemic drugs previously used: Actos, metformin stopped in 5/23, glipizide Insulin was started in 2006 and has taken Lantus, NovoLog as well as NPH and regular  COMPLICATIONS -  MI/Stroke -  retinopathy +  neuropathy -  nephropathy  BLOOD SUGAR DATA  CGM interpretation: At today's visit, we reviewed her CGM downloads. The full report is  scanned in the media. Reviewing the CGM trends, BG are well controlled across the day except lows overnight.   Physical Exam  BP (!) 140/82   Pulse (!) 101   Wt 197 lb (89.4 kg)   SpO2 98%   BMI 28.27 kg/m    Constitutional: well developed, well nourished Head: normocephalic, atraumatic Eyes: sclera  anicteric, no redness Neck: supple Lungs: normal respiratory effort Neurology: alert and oriented Skin: dry, no appreciable rashes Musculoskeletal: no appreciable defects Psychiatric: normal mood and affect Diabetic Foot Exam - Simple   No data filed      Current Medications Patient's Medications  New Prescriptions   No medications on file  Previous Medications   ASCORBIC ACID (VITAMIN C) 500 MG TABLET    Take 1 tablet (500 mg total) by mouth daily.   CHOLECALCIFEROL (VITAMIN D3) 125 MCG (5000 UT) CAPS    Take 5,000 Units by mouth 2 (two) times a week.   CLOPIDOGREL (PLAVIX) 75 MG TABLET    Take 1 tablet (75 mg total) by mouth daily.   CONTINUOUS GLUCOSE SENSOR (FREESTYLE LIBRE 3 SENSOR) MISC    Place 1 sensor on the skin every 14 days. Use to check glucose continuously   CYANOCOBALAMIN 1000 MCG TABLET    Take 1,000 mcg by mouth daily.   CYCLOBENZAPRINE (FLEXERIL) 10 MG  TABLET    Take 1 tablet (10 mg total) by mouth 2 (two) times daily as needed for muscle spasms.   EMPAGLIFLOZIN-METFORMIN HCL ER (SYNJARDY XR) 12.05-998 MG TB24    Take 2 tablets by mouth daily.   GLUCAGON (GVOKE HYPOPEN 1-PACK) 1 MG/0.2ML SOAJ    Inject 1 mg into the skin as needed (low blood sugar with imaopired consciousness).   GLUCOSE BLOOD (ONETOUCH ULTRA) TEST STRIP    USE AS INSTRUCTED TO TEST BLOOD SUGARS 3 OR 4 TIMES DAILY   INSULIN PEN NEEDLE (SURE COMFORT PEN NEEDLES) 32G X 4 MM MISC    USE 4 TIMES A DAY   IRON, FERROUS SULFATE, 325 (65 FE) MG TABS    Take on Sunday and Wednesday.  2 tabs per week.   LATANOPROST (XALATAN) 0.005 % OPHTHALMIC SOLUTION    Place 1 drop into both eyes at bedtime.   LISINOPRIL (ZESTRIL) 40 MG TABLET    Take 1 tablet (40 mg total) by mouth daily.   OMEGA-3 FATTY ACIDS (FISH OIL PO)    Take 1,000 mg by mouth once a week.   ROSUVASTATIN (CRESTOR) 20 MG TABLET    Take 1 tablet (20 mg total) by mouth daily.   TRAMADOL (ULTRAM) 50 MG TABLET    TAKE 1 TABLET TWO TO THREE TIMES A DAY AS  NEEDED FOR MODERATE PAIN  Modified Medications   Modified Medication Previous Medication   DULAGLUTIDE (TRULICITY) 4.5 MG/0.5ML SOAJ Dulaglutide (TRULICITY) 4.5 MG/0.5ML SOPN      Inject 4.5 mg as directed once a week.    Inject 4.5 mg as directed once a week.   INSULIN ASPART (NOVOLOG FLEXPEN) 100 UNIT/ML FLEXPEN insulin aspart (NOVOLOG FLEXPEN) 100 UNIT/ML FlexPen      Inject 30 Units into the skin 3 (three) times daily with meals.    Inject 28 Units into the skin 3 (three) times daily with meals.   INSULIN GLARGINE, 2 UNIT DIAL, (TOUJEO MAX SOLOSTAR) 300 UNIT/ML SOLOSTAR PEN insulin glargine, 2 Unit Dial, (TOUJEO MAX SOLOSTAR) 300 UNIT/ML Solostar Pen      Inject 70 Units into the skin daily.    Inject 78 Units into the skin daily.  Discontinued Medications   No medications on file    Allergies No Known Allergies  Past Medical History Past Medical History:  Diagnosis Date   Arthritis    Coronary atherosclerosis    a. 07/2018 CT Chest: 3 vessel coronary atherosclerosis; b. 07/2018 MV: EF 46% (GI uptake artifact), No ischemia/infarct. Mild to mod diffuse Ao atherosclerosis and at least moderate 2 vessel CAD (LAD/RCA) on CT imaging. Low risk.   Depression    improved on sertraline   Diabetes mellitus, type 2 (HCC) 09/1996   GERD (gastroesophageal reflux disease) 1990   History of peptic ulcer disease    Hyperlipidemia 1992   Hypertension 05/2003   Lumbar stenosis L2-3   Muscle atrophy    in arms from degenerative changes in neck   Neck pain    chronic   Smoker    Wears glasses     Past Surgical History Past Surgical History:  Procedure Laterality Date   ABDOMINAL AORTOGRAM W/LOWER EXTREMITY N/A 09/03/2019   Procedure: ABDOMINAL AORTOGRAM W/LOWER EXTREMITY;  Surgeon: Iran Ouch, MD;  Location: [facility];  Service: Cardiovascular;  Laterality: N/A;  Limited Study   CERVICAL DISC SURGERY  04/19/2002   fusion, Dr. Ophelia Charter   CERVICAL DISCECTOMY  01/31/1995   partial    CERVICAL  DISCECTOMY  01/30/1998   COLONOSCOPY WITH ESOPHAGOGASTRODUODENOSCOPY (EGD)  06/25/2015   ENTEROSCOPY N/A 07/04/2021   Procedure: ENTEROSCOPY;  Surgeon: Sherrilyn Rist, MD;  Location: [facility];  Service: Gastroenterology;  Laterality: N/A;   HEMORRHOID SURGERY  01/31/1987   HOT HEMOSTASIS N/A 07/04/2021   Procedure: HOT HEMOSTASIS (ARGON PLASMA COAGULATION/BICAP);  Surgeon: Sherrilyn Rist, MD;  Location: [facility];  Service: Gastroenterology;  Laterality: N/A;   KNEE ARTHROPLASTY  11/20/2011   Procedure: COMPUTER ASSISTED TOTAL KNEE ARTHROPLASTY;  Surgeon: Eldred Manges, MD;  Location: [facility] OR;  Service: Orthopedics;  Laterality: Left;  Conversion Left Knee Medial Uni to Total Knee Arthroplasty-Cemented   LUMBAR LAMINECTOMY/DECOMPRESSION MICRODISCECTOMY N/A 04/28/2015   Procedure: L2-3 Decompression;  Surgeon: Eldred Manges, MD;  Location[facility];  Service: Orthopedics;  Laterality: N/A;   MR MRA DUPLICATE EXAM  INACTIVATE     MULTIPLE TOOTH EXTRACTIONS     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 Samuella Cota   STOMACH SURGERY  01/30/1982   [facility], [facility]   SUBMUCOSAL TATTOO INJECTION  07/04/2021   Procedure: SUBMUCOSAL TATTOO INJECTION;  Surgeon: Sherrilyn Rist, MD;  Location: [facility];  Service: Gastroenterology;;    Family History family history includes Breast cancer in his maternal grandmother; Diabetes in his brother, brother, father, mother, and sister; Heart disease in his brother and son; Hypertension in his mother; Lung disease in his paternal grandfather; Stroke in his mother.  Social History Social History   Socioeconomic History   Marital status: Married    Spouse name: Not on  file   Number of children: 3   Years of education: Not on file   Highest education level: GED or equivalent  Occupational History   Occupation: Ship broker, retired    Comment: Firefighter  Tobacco Use   Smoking status: Former    Current packs/day: 0.00    Average packs/day: 1 pack/day for 41.0 years (41.0 ttl pk-yrs)    Types: Cigarettes    Start date: 55    Quit date: 2014    Years since quitting: 11.0   Smokeless tobacco: Never  Vaping Use   Vaping status: Never Used  Substance and Sexual Activity   Alcohol use: No    Alcohol/week: 0.0 standard drinks of alcohol   Drug use: Never   Sexual activity: Not Currently  Other Topics Concern   Not on file  Social History Narrative   Retired Photographer, [employer], aviation fuel, [phone], no known agent orange exposure but 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   Married 1975   3 kids   Social Drivers of Corporate investment banker Strain: Low Risk  (02/11/2023)   Overall Financial Resource Strain (CARDIA)    Difficulty of Paying Living Expenses: Not hard at all  Food Insecurity: Food Insecurity Present (02/11/2023)   Hunger Vital Sign    Worried About Running Out of Food in the Last Year: Sometimes true    Ran Out of Food in the Last Year: Sometimes true  Transportation Needs: No Transportation Needs (02/11/2023)   PRAPARE - Administrator, Civil Service (Medical): No    Lack of Transportation (Non-Medical):  No  Physical Activity: Insufficiently Active (02/11/2023)   Exercise Vital Sign    Days of Exercise per Week: 1 day    Minutes of Exercise per Session: 10 min  Stress: No Stress Concern Present (02/11/2023)   [employer] of Occupational Health - Occupational Stress Questionnaire    Feeling of Stress : Not at all  Social Connections: Moderately Isolated (02/11/2023)   Social Connection and Isolation Panel [NHANES]    Frequency of Communication with Friends and Family: Twice a week    Frequency  of Social Gatherings with Friends and Family: Twice a week    Attends Religious Services: Never    Database administrator or Organizations: No    Attends Banker Meetings: Never    Marital Status: Married  Catering manager Violence: Not At Risk (02/06/2023)   Humiliation, Afraid, Rape, and Kick questionnaire    Fear of Current or Ex-Partner: No    Emotionally Abused: No    Physically Abused: No    Sexually Abused: No    Lab Results  Component Value Date   HGBA1C 6.3 (A) 03/01/2023   HGBA1C 5.8 (A) 11/21/2022   HGBA1C 6.3 08/14/2022   Lab Results  Component Value Date   CHOL 124 10/09/2022   Lab Results  Component Value Date   HDL 33.50 (L) 10/09/2022   Lab Results  Component Value Date   LDLCALC 52 10/09/2022   Lab Results  Component Value Date   TRIG 195.0 (H) 10/09/2022   Lab Results  Component Value Date   CHOLHDL 4 10/09/2022   Lab Results  Component Value Date   CREATININE 1.23 10/09/2022   Lab Results  Component Value Date   GFR 59.36 (L) 10/09/2022   Lab Results  Component Value Date   MICROALBUR 2.8 (H) 10/09/2022      Component Value Date/Time   NA 141 10/09/2022 0801   NA 142 08/14/2019 1136   K 4.9 10/09/2022 0801   CL 104 10/09/2022 0801   CO2 31 10/09/2022 0801   GLUCOSE 78 10/09/2022 0801   BUN 20 10/09/2022 0801   BUN 31 (H) 08/14/2019 1136   CREATININE 1.23 10/09/2022 0801   CALCIUM 9.9 10/09/2022 0801   PROT 7.6 10/09/2022 0801   PROT 7.2 09/28/2020 1452   ALBUMIN 4.1 10/09/2022 0801   ALBUMIN 4.1 09/28/2020 1452   AST 27 10/09/2022 0801   ALT 28 10/09/2022 0801   ALKPHOS 64 10/09/2022 0801   BILITOT 0.6 10/09/2022 0801   BILITOT 0.3 09/28/2020 1452   GFRNONAA 63 08/14/2019 1136   GFRAA 73 08/14/2019 1136      Latest Ref Rng & Units 10/09/2022    8:01 AM 08/14/2022   11:00 AM 05/12/2022   10:51 AM  BMP  Glucose 70 - 99 mg/dL 78  54  010   BUN 6 - 23 mg/dL 20  16  20    Creatinine 0.40 - 1.50 mg/dL 2.72  5.36   6.44   Sodium 135 - 145 mEq/L 141  139  142   Potassium 3.5 - 5.1 mEq/L 4.9  4.2  4.6   Chloride 96 - 112 mEq/L 104  104  105   CO2 19 - 32 mEq/L 31  27  25    Calcium 8.4 - 10.5 mg/dL 9.9  9.6  9.6        Component Value Date/Time   WBC 7.3 02/12/2023 1221   RBC 5.14 02/12/2023 1221   HGB 16.6 02/12/2023 1221  HGB 13.6 08/14/2019 1136   HCT 50.4 02/12/2023 1221   HCT 39.4 08/14/2019 1136   PLT 196.0 02/12/2023 1221   PLT 148 (L) 08/14/2019 1136   MCV 98.0 02/12/2023 1221   MCV 98 (H) 08/14/2019 1136   MCH 33.8 (H) 08/14/2019 1136   MCH 21.8 (L) 04/27/2015 1016   MCHC 33.0 02/12/2023 1221   RDW 13.0 02/12/2023 1221   RDW 12.4 08/14/2019 1136   LYMPHSABS 1.3 02/12/2023 1221   MONOABS 0.9 02/12/2023 1221   EOSABS 0.2 02/12/2023 1221   BASOSABS 0.0 02/12/2023 1221     Parts of this note may have been dictated using voice recognition software. There may be variances in spelling and vocabulary which are unintentional. Not all errors are proofread. Please notify the Thereasa Parkin if any discrepancies are noted or if the meaning of any statement is not clear.

## 2023-03-02 ENCOUNTER — Encounter: Payer: Self-pay | Admitting: "Endocrinology

## 2023-03-03 DIAGNOSIS — E1165 Type 2 diabetes mellitus with hyperglycemia: Secondary | ICD-10-CM | POA: Diagnosis not present

## 2023-03-08 ENCOUNTER — Other Ambulatory Visit (HOSPITAL_COMMUNITY): Payer: Self-pay

## 2023-04-02 DIAGNOSIS — E1165 Type 2 diabetes mellitus with hyperglycemia: Secondary | ICD-10-CM | POA: Diagnosis not present

## 2023-04-12 ENCOUNTER — Other Ambulatory Visit: Payer: Self-pay | Admitting: "Endocrinology

## 2023-04-12 ENCOUNTER — Encounter: Payer: Self-pay | Admitting: "Endocrinology

## 2023-04-12 ENCOUNTER — Ambulatory Visit (INDEPENDENT_AMBULATORY_CARE_PROVIDER_SITE_OTHER): Payer: Medicare Other | Admitting: "Endocrinology

## 2023-04-12 VITALS — BP 140/60 | HR 93 | Ht 70.0 in | Wt 196.0 lb

## 2023-04-12 DIAGNOSIS — Z7984 Long term (current) use of oral hypoglycemic drugs: Secondary | ICD-10-CM

## 2023-04-12 DIAGNOSIS — Z7985 Long-term (current) use of injectable non-insulin antidiabetic drugs: Secondary | ICD-10-CM

## 2023-04-12 DIAGNOSIS — Z794 Long term (current) use of insulin: Secondary | ICD-10-CM | POA: Diagnosis not present

## 2023-04-12 DIAGNOSIS — E11649 Type 2 diabetes mellitus with hypoglycemia without coma: Secondary | ICD-10-CM | POA: Diagnosis not present

## 2023-04-12 DIAGNOSIS — E782 Mixed hyperlipidemia: Secondary | ICD-10-CM | POA: Diagnosis not present

## 2023-04-12 MED ORDER — TIRZEPATIDE 5 MG/0.5ML ~~LOC~~ SOAJ
5.0000 mg | SUBCUTANEOUS | 0 refills | Status: DC
Start: 2023-04-12 — End: 2023-04-12

## 2023-04-12 NOTE — Progress Notes (Signed)
 Outpatient Endocrinology Note Anthony Marine, MD  04/12/23   [name] 03-Nov-1950 [id number]  Referring Provider: Joaquim Nam, MD Primary Care Provider: Joaquim Nam, MD Reason for consultation: Subjective   Assessment & Plan  Diagnoses and all orders for this visit:  Uncontrolled type 2 diabetes mellitus with hypoglycemia without coma (HCC)  Long term (current) use of oral hypoglycemic drugs  Long-term (current) use of injectable non-insulin antidiabetic drugs  Long-term insulin use (HCC)  Mixed hypercholesterolemia and hypertriglyceridemia  Other orders -     tirzepatide [facility]) 5 MG/0.5ML Pen; Inject 5 mg into the skin once a week.    Diabetes Type 2 complicated by neuropathy   Lab Results  Component Value Date   GFR 59.36 (L) 10/09/2022   Hba1c goal less than 7, current Hba1c is  Lab Results  Component Value Date   HGBA1C 6.3 (A) 03/01/2023   Will recommend the following: Toujeo 70 units once at night Novolog insulin 25 units before break fast, 25 units before lunch and 25 units before dinner 15 min before meals Synjardy 12.05/998 twice a day     Continue Trulicity 4.5 mg weekly (ordered mounjaro, if covered pt will stop Trulicity) S/p nutrition therapy Follows with podiatry Anthony Cuevas [facility], has a stent one leg and had vascular studies done per patient   No known contraindications/side effects to any of above medications Glucagon ordered with refills on 07/18/22 No history of MEN syndrome/medullary thyroid cancer/pancreatitis or pancreatic cancer in self or family  -Last LD and Tg are as follows: Lab Results  Component Value Date   LDLCALC 52 10/09/2022    Lab Results  Component Value Date   TRIG 195.0 (H) 10/09/2022   -Recommend rosuvastatin 20 mg QD -Follow low fat diet and exercise   -Blood pressure goal <140/90 - Microalbumin/creatinine goal < 30 -Last MA/Cr is as follows: Lab Results  Component  Value Date   MICROALBUR 2.8 (H) 10/09/2022   - on ACE/ARB lisinopril 40 mg qd -diet changes including salt restriction -limit eating outside -counseled BP targets per standards of diabetes care -uncontrolled blood pressure can lead to retinopathy, nephropathy and cardiovascular and atherosclerotic heart disease  Reviewed and counseled on: -A1C target -Blood sugar targets -Complications of uncontrolled diabetes  -Checking blood sugar before meals and bedtime and bring log next visit -All medications with mechanism of action and side effects -Hypoglycemia management: rule of 15's, Glucagon Emergency Kit and medical alert ID -low-carb low-fat plate-method diet -At least 20 minutes of physical activity per day -Annual dilated retinal eye exam and foot exam -compliance and follow up needs -follow up as scheduled or earlier if problem gets worse  Call if blood sugar is less than 70 or consistently above 250    Take a 15 gm snack of carbohydrate at bedtime before you go to sleep if your blood sugar is less than 100.    If you are going to fast after midnight for a test or procedure, ask your physician for instructions on how to reduce/decrease your insulin dose.    Call if blood sugar is less than 70 or consistently above 250  -Treating a low sugar by rule of 15  (15 gms of sugar every 15 min until sugar is more than 70) If you feel your sugar is low, test your sugar to be sure If your sugar is low (less than 70), then take 15 grams of a fast acting Carbohydrate (3-4 glucose tablets  or glucose gel or 4 ounces of juice or regular soda) Recheck your sugar 15 min after treating low to make sure it is more than 70 If sugar is still less than 70, treat again with 15 grams of carbohydrate          Don't drive the hour of hypoglycemia  If unconscious/unable to eat or drink by mouth, use glucagon injection or nasal spray baqsimi and call 911. Can repeat again in 15 min if still  unconscious.  Return in about 3 months (around 07/13/2023).   I have reviewed current medications, nurse's notes, allergies, vital signs, past medical and surgical history, family medical history, and social history for this encounter. Counseled patient on symptoms, examination findings, lab findings, imaging results, treatment decisions and monitoring and prognosis. The patient understood the recommendations and agrees with the treatment plan. All questions regarding treatment plan were fully answered.  Anthony Parkman, MD  04/12/23   History of Present Illness Anthony Cuevas is a 73 y.o. year old male who presents for follow up of Type 2 diabetes mellitus.  Anthony Cuevas was first diagnosed in 8.   Diabetes education +  Home diabetes regimen: Synajrdy 12.05/998 once a day Toujeo 70 units once at night Novolog insulin 30 units before meals during meals       Jardiance 25 mg every day  Trulicity 4.5 mg weekly    Previous history: Non-insulin hypoglycemic drugs previously used: Actos, metformin stopped in 5/23, glipizide Insulin was started in 2006 and has taken Lantus, NovoLog as well as NPH and regular  COMPLICATIONS -  MI/Stroke -  retinopathy +  neuropathy -  nephropathy  BLOOD SUGAR DATA  CGM interpretation: At today's visit, we reviewed her CGM downloads. The full report is  scanned in the media. Reviewing the CGM trends, BG are running on the low side intermittently across the day.  Physical Exam  BP (!) 140/60   Pulse 93   Ht 5\' 10"  (1.778 m)   Wt 196 lb (88.9 kg)   SpO2 98%   BMI 28.12 kg/m    Constitutional: well developed, well nourished Head: normocephalic, atraumatic Eyes: sclera anicteric, no redness Neck: supple Lungs: normal respiratory effort Neurology: alert and oriented Skin: dry, no appreciable rashes Musculoskeletal: no appreciable defects Psychiatric: normal mood and affect Diabetic Foot Exam - Simple   No data filed      Current  Medications Patient's Medications  New Prescriptions   TIRZEPATIDE (MOUNJARO) 5 MG/0.5ML PEN    Inject 5 mg into the skin once a week.  Previous Medications   ASCORBIC ACID (VITAMIN C) 500 MG TABLET    Take 1 tablet (500 mg total) by mouth daily.   CHOLECALCIFEROL (VITAMIN D3) 125 MCG (5000 UT) CAPS    Take 5,000 Units by mouth 2 (two) times a week.   CLOPIDOGREL (PLAVIX) 75 MG TABLET    Take 1 tablet (75 mg total) by mouth daily.   CONTINUOUS GLUCOSE SENSOR (FREESTYLE LIBRE 3 SENSOR) MISC    Place 1 sensor on the skin every 14 days. Use to check glucose continuously   CYANOCOBALAMIN 1000 MCG TABLET    Take 1,000 mcg by mouth daily.   CYCLOBENZAPRINE (FLEXERIL) 10 MG TABLET    Take 1 tablet (10 mg total) by mouth 2 (two) times daily as needed for muscle spasms.   EMPAGLIFLOZIN-METFORMIN HCL ER (SYNJARDY XR) 12.05-998 MG TB24    Take 2 tablets by mouth daily.   GLUCAGON (GVOKE HYPOPEN 1-PACK)  1 MG/0.2ML SOAJ    Inject 1 mg into the skin as needed (low blood sugar with imaopired consciousness).   GLUCOSE BLOOD (ONETOUCH ULTRA) TEST STRIP    USE AS INSTRUCTED TO TEST BLOOD SUGARS 3 OR 4 TIMES DAILY   INSULIN ASPART (NOVOLOG FLEXPEN) 100 UNIT/ML FLEXPEN    Inject 30 Units into the skin 3 (three) times daily with meals.   INSULIN GLARGINE, 2 UNIT DIAL, (TOUJEO MAX SOLOSTAR) 300 UNIT/ML SOLOSTAR PEN    Inject 70 Units into the skin daily.   INSULIN PEN NEEDLE (SURE COMFORT PEN NEEDLES) 32G X 4 MM MISC    USE 4 TIMES A DAY   IRON, FERROUS SULFATE, 325 (65 FE) MG TABS    Take on Sunday and Wednesday.  2 tabs per week.   LATANOPROST (XALATAN) 0.005 % OPHTHALMIC SOLUTION    Place 1 drop into both eyes at bedtime.   LISINOPRIL (ZESTRIL) 40 MG TABLET    Take 1 tablet (40 mg total) by mouth daily.   OMEGA-3 FATTY ACIDS (FISH OIL PO)    Take 1,000 mg by mouth once a week.   ROSUVASTATIN (CRESTOR) 20 MG TABLET    Take 1 tablet (20 mg total) by mouth daily.   TRAMADOL (ULTRAM) 50 MG TABLET    TAKE 1 TABLET TWO  TO THREE TIMES A DAY AS NEEDED FOR MODERATE PAIN  Modified Medications   No medications on file  Discontinued Medications   DULAGLUTIDE (TRULICITY) 4.5 MG/0.5ML SOAJ    Inject 4.5 mg as directed once a week.    Allergies No Known Allergies  Past Medical History Past Medical History:  Diagnosis Date   Arthritis    Coronary atherosclerosis    a. 07/2018 CT Chest: 3 vessel coronary atherosclerosis; b. 07/2018 MV: EF 46% (GI uptake artifact), No ischemia/infarct. Mild to mod diffuse Ao atherosclerosis and at least moderate 2 vessel CAD (LAD/RCA) on CT imaging. Low risk.   Depression    improved on sertraline   Diabetes mellitus, type 2 (HCC) 09/1996   GERD (gastroesophageal reflux disease) 1990   History of peptic ulcer disease    Hyperlipidemia 1992   Hypertension 05/2003   Lumbar stenosis L2-3   Muscle atrophy    in arms from degenerative changes in neck   Neck pain    chronic   Smoker    Wears glasses     Past Surgical History Past Surgical History:  Procedure Laterality Date   ABDOMINAL AORTOGRAM W/LOWER EXTREMITY N/A 09/03/2019   Procedure: ABDOMINAL AORTOGRAM W/LOWER EXTREMITY;  Surgeon: Iran Ouch, MD;  Location: [facility];  Service: Cardiovascular;  Laterality: N/A;  Limited Study   CERVICAL DISC SURGERY  04/19/2002   fusion, Dr. Ophelia Charter   CERVICAL DISCECTOMY  01/31/1995   partial   CERVICAL DISCECTOMY  01/30/1998   COLONOSCOPY WITH ESOPHAGOGASTRODUODENOSCOPY (EGD)  06/25/2015   ENTEROSCOPY N/A 07/04/2021   Procedure: ENTEROSCOPY;  Surgeon: Sherrilyn Rist, MD;  Location: [facility];  Service: Gastroenterology;  Laterality: N/A;   HEMORRHOID SURGERY  01/31/1987   HOT HEMOSTASIS N/A 07/04/2021   Procedure: HOT HEMOSTASIS (ARGON PLASMA COAGULATION/BICAP);  Surgeon: Sherrilyn Rist, MD;  Location: [facility];  Service: Gastroenterology;  Laterality: N/A;   KNEE ARTHROPLASTY  11/20/2011   Procedure: COMPUTER ASSISTED TOTAL KNEE ARTHROPLASTY;  Surgeon:  Eldred Manges, MD;  Location: [facility] OR;  Service: Orthopedics;  Laterality: Left;  Conversion Left Knee Medial Uni to Total Knee Arthroplasty-Cemented   LUMBAR LAMINECTOMY/DECOMPRESSION  MICRODISCECTOMY N/A 04/28/2015   Procedure: L2-3 Decompression;  Surgeon: Eldred Manges, MD;  Location[facility];  Service: Orthopedics;  Laterality: N/A;   MR MRA DUPLICATE EXAM  INACTIVATE     MULTIPLE TOOTH EXTRACTIONS     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 Samuella Cota   STOMACH SURGERY  01/30/1982   [facility], [facility]   SUBMUCOSAL TATTOO INJECTION  07/04/2021   Procedure: SUBMUCOSAL TATTOO INJECTION;  Surgeon: Sherrilyn Rist, MD;  Location: [facility];  Service: Gastroenterology;;    Family History family history includes Breast cancer in his maternal grandmother; Diabetes in his brother, brother, father, mother, and sister; Heart disease in his brother and son; Hypertension in his mother; Lung disease in his paternal grandfather; Stroke in his mother.  Social History Social History   Socioeconomic History   Marital status: Married    Spouse name: Not on file   Number of children: 3   Years of education: Not on file   Highest education level: GED or equivalent  Occupational History   Occupation: Ship broker, retired    Comment: Firefighter  Tobacco Use   Smoking status: Former    Current packs/day: 0.00    Average packs/day: 1 pack/day for 41.0 years (41.0 ttl pk-yrs)    Types: Cigarettes    Start date: 7    Quit date: 2014    Years since quitting: 11.2   Smokeless tobacco: Never  Vaping Use   Vaping status: Never Used  Substance and Sexual Activity   Alcohol use: No    Alcohol/week: 0.0 standard drinks of  alcohol   Drug use: Never   Sexual activity: Not Currently  Other Topics Concern   Not on file  Social History Narrative   Retired Photographer, [employer], aviation fuel, [phone], no known agent orange exposure but 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   Married 1975   3 kids   Social Drivers of Corporate investment banker Strain: Low Risk  (02/11/2023)   Overall Financial Resource Strain (CARDIA)    Difficulty of Paying Living Expenses: Not hard at all  Food Insecurity: Food Insecurity Present (02/11/2023)   Hunger Vital Sign    Worried About Running Out of Food in the Last Year: Sometimes true    Ran Out of Food in the Last Year: Sometimes true  Transportation Needs: No Transportation Needs (02/11/2023)   PRAPARE - Administrator, Civil Service (Medical): No    Lack of Transportation (Non-Medical): No  Physical Activity: Insufficiently Active (02/11/2023)   Exercise Vital Sign    Days of Exercise per Week: 1 day    Minutes of Exercise per Session: 10 min  Stress: No Stress Concern Present (02/11/2023)   [employer] of Occupational Health - Occupational Stress Questionnaire    Feeling of Stress : Not at all  Social Connections: Moderately Isolated (02/11/2023)   Social Connection and Isolation Panel [NHANES]    Frequency of Communication with Friends and Family: Twice a week    Frequency of Social Gatherings with Friends and Family: Twice a week    Attends Religious Services: Never    Database administrator or Organizations: No  Attends Club or Organization Meetings: Never    Marital Status: Married  Catering manager Violence: Not At Risk (02/06/2023)   Humiliation, Afraid, Rape, and Kick questionnaire    Fear of Current or Ex-Partner: No    Emotionally Abused: No    Physically Abused: No    Sexually Abused: No    Lab Results  Component Value Date   HGBA1C 6.3 (A) 03/01/2023   HGBA1C 5.8 (A) 11/21/2022   HGBA1C 6.3 08/14/2022   Lab Results  Component  Value Date   CHOL 124 10/09/2022   Lab Results  Component Value Date   HDL 33.50 (L) 10/09/2022   Lab Results  Component Value Date   LDLCALC 52 10/09/2022   Lab Results  Component Value Date   TRIG 195.0 (H) 10/09/2022   Lab Results  Component Value Date   CHOLHDL 4 10/09/2022   Lab Results  Component Value Date   CREATININE 1.23 10/09/2022   Lab Results  Component Value Date   GFR 59.36 (L) 10/09/2022   Lab Results  Component Value Date   MICROALBUR 2.8 (H) 10/09/2022      Component Value Date/Time   NA 141 10/09/2022 0801   NA 142 08/14/2019 1136   K 4.9 10/09/2022 0801   CL 104 10/09/2022 0801   CO2 31 10/09/2022 0801   GLUCOSE 78 10/09/2022 0801   BUN 20 10/09/2022 0801   BUN 31 (H) 08/14/2019 1136   CREATININE 1.23 10/09/2022 0801   CALCIUM 9.9 10/09/2022 0801   PROT 7.6 10/09/2022 0801   PROT 7.2 09/28/2020 1452   ALBUMIN 4.1 10/09/2022 0801   ALBUMIN 4.1 09/28/2020 1452   AST 27 10/09/2022 0801   ALT 28 10/09/2022 0801   ALKPHOS 64 10/09/2022 0801   BILITOT 0.6 10/09/2022 0801   BILITOT 0.3 09/28/2020 1452   GFRNONAA 63 08/14/2019 1136   GFRAA 73 08/14/2019 1136      Latest Ref Rng & Units 10/09/2022    8:01 AM 08/14/2022   11:00 AM 05/12/2022   10:51 AM  BMP  Glucose 70 - 99 mg/dL 78  54  161   BUN 6 - 23 mg/dL 20  16  20    Creatinine 0.40 - 1.50 mg/dL 0.96  0.45  4.09   Sodium 135 - 145 mEq/L 141  139  142   Potassium 3.5 - 5.1 mEq/L 4.9  4.2  4.6   Chloride 96 - 112 mEq/L 104  104  105   CO2 19 - 32 mEq/L 31  27  25    Calcium 8.4 - 10.5 mg/dL 9.9  9.6  9.6        Component Value Date/Time   WBC 7.3 02/12/2023 1221   RBC 5.14 02/12/2023 1221   HGB 16.6 02/12/2023 1221   HGB 13.6 08/14/2019 1136   HCT 50.4 02/12/2023 1221   HCT 39.4 08/14/2019 1136   PLT 196.0 02/12/2023 1221   PLT 148 (L) 08/14/2019 1136   MCV 98.0 02/12/2023 1221   MCV 98 (H) 08/14/2019 1136   MCH 33.8 (H) 08/14/2019 1136   MCH 21.8 (L) 04/27/2015 1016   MCHC  33.0 02/12/2023 1221   RDW 13.0 02/12/2023 1221   RDW 12.4 08/14/2019 1136   LYMPHSABS 1.3 02/12/2023 1221   MONOABS 0.9 02/12/2023 1221   EOSABS 0.2 02/12/2023 1221   BASOSABS 0.0 02/12/2023 1221     Parts of this note may have been dictated using voice recognition software. There may be variances in spelling and  vocabulary which are unintentional. Not all errors are proofread. Please notify the Thereasa Parkin if any discrepancies are noted or if the meaning of any statement is not clear.

## 2023-04-12 NOTE — Patient Instructions (Signed)

## 2023-04-13 ENCOUNTER — Encounter: Payer: Self-pay | Admitting: "Endocrinology

## 2023-04-25 ENCOUNTER — Encounter: Payer: Self-pay | Admitting: Podiatry

## 2023-04-25 ENCOUNTER — Ambulatory Visit: Payer: Medicare Other

## 2023-04-25 ENCOUNTER — Ambulatory Visit (INDEPENDENT_AMBULATORY_CARE_PROVIDER_SITE_OTHER): Payer: Medicare Other | Admitting: Podiatry

## 2023-04-25 DIAGNOSIS — E119 Type 2 diabetes mellitus without complications: Secondary | ICD-10-CM | POA: Diagnosis not present

## 2023-04-25 DIAGNOSIS — M2142 Flat foot [pes planus] (acquired), left foot: Secondary | ICD-10-CM

## 2023-04-25 DIAGNOSIS — B351 Tinea unguium: Secondary | ICD-10-CM

## 2023-04-25 DIAGNOSIS — M2010 Hallux valgus (acquired), unspecified foot: Secondary | ICD-10-CM

## 2023-04-25 DIAGNOSIS — M2141 Flat foot [pes planus] (acquired), right foot: Secondary | ICD-10-CM

## 2023-04-25 DIAGNOSIS — M79676 Pain in unspecified toe(s): Secondary | ICD-10-CM

## 2023-04-25 NOTE — Progress Notes (Signed)
This patient returns to my office for at risk foot care.  This patient requires this care by a professional since this patient will be at risk due to having type 2 diabetes.  This patient is unable to cut nails himself since the patient cannot reach his nails.These nails are painful walking and wearing shoes.  This patient presents for at risk foot care today.  General Appearance  Alert, conversant and in no acute stress.  Vascular  Dorsalis pedis and posterior tibial  pulses are palpable  bilaterally.  Capillary return is within normal limits  bilaterally. Temperature is within normal limits  bilaterally.  Neurologic  Senn-Weinstein monofilament wire test diminished  bilaterally. Muscle power within normal limits bilaterally.  Nails Thick disfigured discolored nails with subungual debris  from hallux to fifth toes bilaterally. No evidence of bacterial infection or drainage bilaterally.  Orthopedic  No limitations of motion  feet .  No crepitus or effusions noted.  No bony pathology or digital deformities noted.  HAV  B/L.  Skin  normotropic skin with no porokeratosis noted bilaterally.  No signs of infections or ulcers noted.     Onychomycosis  Pain in right toes  Pain in left toes  Consent was obtained for treatment procedures.   Mechanical debridement of nails 1-5  bilaterally performed with a nail nipper.  Filed with dremel without incident.    Return office visit    10   weeks                  Told patient to return for periodic foot care and evaluation due to potential at risk complications.   Everette Mall DPM  

## 2023-05-07 ENCOUNTER — Other Ambulatory Visit: Payer: Self-pay | Admitting: Family Medicine

## 2023-05-07 ENCOUNTER — Other Ambulatory Visit: Payer: Self-pay

## 2023-05-07 DIAGNOSIS — E11649 Type 2 diabetes mellitus with hypoglycemia without coma: Secondary | ICD-10-CM

## 2023-05-07 MED ORDER — NOVOLOG FLEXPEN 100 UNIT/ML ~~LOC~~ SOPN: 30.0000 [IU] | PEN_INJECTOR | Freq: Three times a day (TID) | SUBCUTANEOUS | 3 refills | Status: DC

## 2023-05-07 MED ORDER — MOUNJARO 5 MG/0.5ML ~~LOC~~ SOAJ: 5.0000 mg | SUBCUTANEOUS | 0 refills | Status: DC

## 2023-05-07 MED ORDER — TOUJEO MAX SOLOSTAR 300 UNIT/ML ~~LOC~~ SOPN: 70.0000 [IU] | PEN_INJECTOR | Freq: Every day | SUBCUTANEOUS | 2 refills | Status: DC

## 2023-05-07 NOTE — Progress Notes (Signed)
 Patient presents to the office today for diabetic shoe and insole measuring.  Patient was measured with brannock device to determine size and width for 1 pair of extra depth shoes and foam casted for 3 pair of insoles.   Documentation of medical necessity will be sent to patient's treating diabetic doctor to verify and sign.   Patient's diabetic provider: Altamese  MD endo  Shoes and insoles will be ordered at that time and patient will be notified for an appointment for fitting when they arrive.   Shoe size (per patient): 11 Patient shoe selection- Shoe choice:   X903M / X920M Shoe size ordered: 11WD  Ppw ABN signed

## 2023-05-07 NOTE — Progress Notes (Signed)
 My Charting on Dr Stacie Acres visit today  Anthony Cuevas Cped, CFo,CFm

## 2023-05-07 NOTE — Telephone Encounter (Signed)
 Requested Prescriptions   Pending Prescriptions Disp Refills   tirzepatide (MOUNJARO) 5 MG/0.5ML Pen 1 mL 0    Sig: Inject 5 mg into the skin once a week.   insulin glargine, 2 Unit Dial, (TOUJEO MAX SOLOSTAR) 300 UNIT/ML Solostar Pen 45 mL 2    Sig: Inject 70 Units into the skin daily.   insulin aspart (NOVOLOG FLEXPEN) 100 UNIT/ML FlexPen 45 mL 3    Sig: Inject 30 Units into the skin 3 (three) times daily with meals.

## 2023-05-10 ENCOUNTER — Telehealth: Payer: Self-pay

## 2023-05-10 NOTE — Telephone Encounter (Signed)
 [facility] call regarding pt medication Ozempic and mounjaro.called pharmacy back to get more info on what they Pharmacy needed. Left detail VM to call back.

## 2023-05-11 ENCOUNTER — Telehealth: Payer: Self-pay

## 2023-05-11 ENCOUNTER — Telehealth: Payer: Self-pay | Admitting: Podiatry

## 2023-05-11 NOTE — Telephone Encounter (Signed)
 [facility] called [facility] to inform office that  the mounjaro that was prescribed during visit is not on the [place] formulary, The pharmacy is request it get switch to Ozempic.

## 2023-05-11 NOTE — Telephone Encounter (Signed)
 Pt calling to check on status of shoes to see if they are in. Please call Pt will an update.  Note: Did sch appt for 6/5 for p/u.

## 2023-05-16 ENCOUNTER — Other Ambulatory Visit: Payer: Self-pay | Admitting: "Endocrinology

## 2023-05-16 ENCOUNTER — Other Ambulatory Visit: Payer: Self-pay

## 2023-05-16 DIAGNOSIS — E11649 Type 2 diabetes mellitus with hypoglycemia without coma: Secondary | ICD-10-CM

## 2023-05-17 ENCOUNTER — Other Ambulatory Visit: Payer: Self-pay

## 2023-05-17 ENCOUNTER — Ambulatory Visit (INDEPENDENT_AMBULATORY_CARE_PROVIDER_SITE_OTHER): Admitting: "Endocrinology

## 2023-05-17 ENCOUNTER — Other Ambulatory Visit (HOSPITAL_COMMUNITY): Payer: Self-pay

## 2023-05-17 ENCOUNTER — Encounter: Payer: Self-pay | Admitting: "Endocrinology

## 2023-05-17 VITALS — BP 120/60 | HR 96 | Ht 70.0 in | Wt 193.0 lb

## 2023-05-17 DIAGNOSIS — Z7985 Long-term (current) use of injectable non-insulin antidiabetic drugs: Secondary | ICD-10-CM

## 2023-05-17 DIAGNOSIS — E782 Mixed hyperlipidemia: Secondary | ICD-10-CM

## 2023-05-17 DIAGNOSIS — Z7984 Long term (current) use of oral hypoglycemic drugs: Secondary | ICD-10-CM

## 2023-05-17 DIAGNOSIS — Z794 Long term (current) use of insulin: Secondary | ICD-10-CM | POA: Diagnosis not present

## 2023-05-17 DIAGNOSIS — E11649 Type 2 diabetes mellitus with hypoglycemia without coma: Secondary | ICD-10-CM

## 2023-05-17 MED ORDER — TRULICITY 4.5 MG/0.5ML ~~LOC~~ SOAJ: 4.5000 mg | SUBCUTANEOUS | 1 refills | Status: DC

## 2023-05-17 MED ORDER — SYNJARDY XR 12.5-1000 MG PO TB24: 2.0000 | ORAL_TABLET | Freq: Every day | ORAL | 1 refills | Status: DC

## 2023-05-17 MED ORDER — MOUNJARO 5 MG/0.5ML ~~LOC~~ SOAJ
5.0000 mg | SUBCUTANEOUS | 0 refills | Status: DC
  Filled 2023-05-17: qty 6, 84d supply, fill #0

## 2023-05-17 NOTE — Progress Notes (Signed)
 Outpatient Endocrinology Note Anthony Algonquin, MD  05/17/23   Anthony Cuevas [birth year] [id number]  Referring Provider: Joaquim Nam, MD Primary Care Provider: Joaquim Nam, MD Reason for consultation: Subjective   Assessment & Plan  Diagnoses and all orders for this visit:  Uncontrolled type 2 diabetes mellitus with hypoglycemia without coma (HCC) -     COMPLETE METABOLIC PANEL WITH eGFR  Long term (current) use of oral hypoglycemic drugs  Long-term (current) use of injectable non-insulin antidiabetic drugs  Long-term insulin use (HCC)  Mixed hypercholesterolemia and hypertriglyceridemia  Other orders -     Dulaglutide (TRULICITY) 4.5 MG/0.5ML SOAJ; Inject 4.5 mg as directed once a week. -     Empagliflozin-metFORMIN HCl ER (SYNJARDY XR) 12.05-998 MG TB24; Take 2 tablets by mouth daily.     Diabetes Type 2 complicated by neuropathy   Lab Results  Component Value Date   GFR 59.36 (L) 10/09/2022   Hba1c goal less than 7, current Hba1c is  Lab Results  Component Value Date   HGBA1C 6.3 (A) 03/01/2023   Will recommend the following: Toujeo 66 units once at night Novolog insulin 25 units before break fast, 26 units before lunch and 28 units before dinner 15 min before meals Synjardy 12.05/998 twice a day     Continue Trulicity 4.5 mg weekly (mounjaro not covered) S/p nutrition therapy Follows with [facility]iatry [facility], has a stent one leg and had vascular studies done per patient   No known contraindications/side effects to any of above medications Glucagon ordered with refills on 07/18/22 No history of MEN syndrome/medullary thyroid cancer/pancreatitis or pancreatic cancer in self or family  -Last LD and Tg are as follows: Lab Results  Component Value Date   LDLCALC 52 10/09/2022    Lab Results  Component Value Date   TRIG 195.0 (H) 10/09/2022   -Recommend rosuvastatin 20 mg QD -Follow low fat diet and exercise    -Blood pressure goal <140/90 - Microalbumin/creatinine goal < 30 -Last MA/Cr is as follows: Lab Results  Component Value Date   MICROALBUR 2.8 (H) 10/09/2022   - on ACE/ARB lisinopril 40 mg qd -diet changes including salt restriction -limit eating outside -counseled BP targets per standards of diabetes care -uncontrolled blood pressure can lead to retinopathy, nephropathy and cardiovascular and atherosclerotic heart disease  Reviewed and counseled on: -A1C target -Blood sugar targets -Complications of uncontrolled diabetes  -Checking blood sugar before meals and bedtime and bring log next visit -All medications with mechanism of action and side effects -Hypoglycemia management: rule of 15's, Glucagon Emergency Kit and medical alert ID -low-carb low-fat plate-method diet -At least 20 minutes of physical activity per day -Annual dilated retinal eye exam and foot exam -compliance and follow up needs -follow up as scheduled or earlier if problem gets worse  Call if blood sugar is less than 70 or consistently above 250    Take a 15 gm snack of carbohydrate at bedtime before you go to sleep if your blood sugar is less than 100.    If you are going to fast after midnight for a test or procedure, ask your physician for instructions on how to reduce/decrease your insulin dose.    Call if blood sugar is less than 70 or consistently above 250  -Treating a low sugar by rule of 15  (15 gms of sugar every 15 min until sugar is more than 70) If you feel your sugar is low, test your  sugar to be sure If your sugar is low (less than 70), then take 15 grams of a fast acting Carbohydrate (3-4 glucose tablets or glucose gel or 4 ounces of juice or regular soda) Recheck your sugar 15 min after treating low to make sure it is more than 70 If sugar is still less than 70, treat again with 15 grams of carbohydrate          Don't drive the hour of hypoglycemia  If unconscious/unable to eat or drink  by mouth, use glucagon injection or nasal spray baqsimi and call 911. Can repeat again in 15 min if still unconscious.  Return in about 6 weeks (around 06/28/2023).   I have reviewed current medications, nurse's notes, allergies, vital signs, past medical and surgical history, family medical history, and social history for this encounter. Counseled patient on symptoms, examination findings, lab findings, imaging results, treatment decisions and monitoring and prognosis. The patient understood the recommendations and agrees with the treatment plan. All questions regarding treatment plan were fully answered.  Jorge Newcomer, MD  05/17/23   History of Present Illness Anthony Cuevas is a 73 y.o. year old male who presents for follow up of Type 2 diabetes mellitus.  Anthony Cuevas was first diagnosed in 39.   Diabetes education +  Home diabetes regimen: Toujeo 70 units once at night Novolog insulin 25 units before break fast, 25 units before lunch and 25 units before dinner 15 min before meals Synjardy 12.05/998 twice a day     Trulicity 4.5 mg weekly   Previous history: Non-insulin hypoglycemic drugs previously used: Actos, metformin stopped in 5/23, glipizide Insulin was started in 2006 and has taken Lantus, NovoLog as well as NPH and regular  COMPLICATIONS -  MI/Stroke -  retinopathy +  neuropathy -  nephropathy  BLOOD SUGAR DATA  CGM interpretation: At today's visit, we reviewed her CGM downloads. The full report is  scanned in the media. Reviewing the CGM trends, BG are running on the low side overnight and high after lunch and supper.   Physical Exam  BP 120/60   Pulse 96   Ht 5\' 10"  (1.778 m)   Wt 193 lb (87.5 kg)   SpO2 100%   BMI 27.69 kg/m    Constitutional: well developed, well nourished Head: normocephalic, atraumatic Eyes: sclera anicteric, no redness Neck: supple Lungs: normal respiratory effort Neurology: alert and oriented Skin: dry, no appreciable  rashes Musculoskeletal: no appreciable defects Psychiatric: normal mood and affect Diabetic Foot Exam - Simple   No data filed      Current Medications Patient's Medications  New Prescriptions   DULAGLUTIDE (TRULICITY) 4.5 MG/0.5ML SOAJ    Inject 4.5 mg as directed once a week.  Previous Medications   ASCORBIC ACID (VITAMIN C) 500 MG TABLET    Take 1 tablet (500 mg total) by mouth daily.   CHOLECALCIFEROL (VITAMIN D3) 125 MCG (5000 UT) CAPS    Take 5,000 Units by mouth 2 (two) times a week.   CLOPIDOGREL (PLAVIX) 75 MG TABLET    Take 1 tablet (75 mg total) by mouth daily.   CONTINUOUS GLUCOSE SENSOR (FREESTYLE LIBRE 3 SENSOR) MISC    Place 1 sensor on the skin every 14 days. Use to check glucose continuously   CYANOCOBALAMIN 1000 MCG TABLET    Take 1,000 mcg by mouth daily.   CYCLOBENZAPRINE (FLEXERIL) 10 MG TABLET    Take 1 tablet (10 mg total) by mouth 2 (two) times daily as  needed for muscle spasms.   FERROUS SULFATE 325 (65 FE) MG TABLET    TAKE 1 TABLET ORALLY DAILY ON SUNDAY, WEDNESDAY (2 TABLETS PER WEEK)   GLUCAGON (GVOKE HYPOPEN 1-PACK) 1 MG/0.2ML SOAJ    Inject 1 mg into the skin as needed (low blood sugar with imaopired consciousness).   GLUCOSE BLOOD (ONETOUCH ULTRA) TEST STRIP    USE AS INSTRUCTED TO TEST BLOOD SUGARS 3 OR 4 TIMES DAILY   INSULIN ASPART (NOVOLOG FLEXPEN) 100 UNIT/ML FLEXPEN    Inject 30 Units into the skin 3 (three) times daily with meals.   INSULIN GLARGINE, 2 UNIT DIAL, (TOUJEO MAX SOLOSTAR) 300 UNIT/ML SOLOSTAR PEN    Inject 70 Units into the skin daily.   INSULIN PEN NEEDLE (SURE COMFORT PEN NEEDLES) 32G X 4 MM MISC    USE 4 TIMES A DAY   LATANOPROST (XALATAN) 0.005 % OPHTHALMIC SOLUTION    Place 1 drop into both eyes at bedtime.   LISINOPRIL (ZESTRIL) 40 MG TABLET    Take 1 tablet (40 mg total) by mouth daily.   OMEGA-3 FATTY ACIDS (FISH OIL PO)    Take 1,000 mg by mouth once a week.   ROSUVASTATIN (CRESTOR) 20 MG TABLET    Take 1 tablet (20 mg total) by  mouth daily.   TRAMADOL (ULTRAM) 50 MG TABLET    TAKE 1 TABLET TWO TO THREE TIMES A DAY AS NEEDED FOR MODERATE PAIN  Modified Medications   Modified Medication Previous Medication   EMPAGLIFLOZIN-METFORMIN HCL ER (SYNJARDY XR) 12.05-998 MG TB24 Empagliflozin-metFORMIN HCl ER (SYNJARDY XR) 12.05-998 MG TB24      Take 2 tablets by mouth daily.    Take 2 tablets by mouth daily.  Discontinued Medications   TIRZEPATIDE (MOUNJARO) 5 MG/0.5ML PEN    Inject 5 mg into the skin once a week.    Allergies No Known Allergies  Past Medical History Past Medical History:  Diagnosis Date   Arthritis    Coronary atherosclerosis    a. 07/2018 CT Chest: 3 vessel coronary atherosclerosis; b. 07/2018 MV: EF 46% (GI uptake artifact), No ischemia/infarct. Mild to mod diffuse Ao atherosclerosis and at least moderate 2 vessel CAD (LAD/RCA) on CT imaging. Low risk.   Depression    improved on sertraline   Diabetes mellitus, type 2 (HCC) 09/1996   GERD (gastroesophageal reflux disease) 1990   History of peptic ulcer disease    Hyperlipidemia 1992   Hypertension 05/2003   Lumbar stenosis L2-3   Muscle atrophy    in arms from degenerative changes in neck   Neck pain    chronic   Smoker    Wears glasses     Past Surgical History Past Surgical History:  Procedure Laterality Date   ABDOMINAL AORTOGRAM W/LOWER EXTREMITY N/A 09/03/2019   Procedure: ABDOMINAL AORTOGRAM W/LOWER EXTREMITY;  Surgeon: Wenona Hamilton, MD;  Location: [facility];  Service: Cardiovascular;  Laterality: N/A;  Limited Study   CERVICAL DISC SURGERY  04/19/2002   fusion, Dr. Murrel Arnt   CERVICAL DISCECTOMY  01/31/1995   partial   CERVICAL DISCECTOMY  01/30/1998   COLONOSCOPY WITH ESOPHAGOGASTRODUODENOSCOPY (EGD)  06/25/2015   ENTEROSCOPY N/A 07/04/2021   Procedure: ENTEROSCOPY;  Surgeon: Albertina Hugger, MD;  Location: [facility];  Service: Gastroenterology;  Laterality: N/A;   HEMORRHOID SURGERY  01/31/1987   HOT HEMOSTASIS  N/A 07/04/2021   Procedure: HOT HEMOSTASIS (ARGON PLASMA COAGULATION/BICAP);  Surgeon: Albertina Hugger, MD;  Location: [facility];  Service: Gastroenterology;  Laterality: N/A;   KNEE ARTHROPLASTY  11/20/2011   Procedure: COMPUTER ASSISTED TOTAL KNEE ARTHROPLASTY;  Surgeon: Adah Acron, MD;  Location: [facility];  Service: Orthopedics;  Laterality: Left;  Conversion Left Knee Medial Uni to Total Knee Arthroplasty-Cemented   LUMBAR LAMINECTOMY/DECOMPRESSION MICRODISCECTOMY N/A 04/28/2015   Procedure: L2-3 Decompression;  Surgeon: Adah Acron, MD;  Location: [facility];  Service: Orthopedics;  Laterality: N/A;   MR MRA DUPLICATE EXAM  INACTIVATE     MULTIPLE TOOTH EXTRACTIONS     Neisen funduplication  01/31/1988   PERIPHERAL VASCULAR INTERVENTION  09/03/2019   Procedure: PERIPHERAL VASCULAR INTERVENTION;  Surgeon: Wenona Hamilton, MD;  Location: [facility];  Service: Cardiovascular;;  Iliac Stent   REPLACEMENT TOTAL KNEE Left 02/14/02, 05/04/02   left- partial   SCC EXCISION  04/25/2001   Dr. Marlow Sin   STOMACH SURGERY  01/30/1982   PUD, [facility]   SUBMUCOSAL TATTOO INJECTION  07/04/2021   Procedure: SUBMUCOSAL TATTOO INJECTION;  Surgeon: Albertina Hugger, MD;  Location: [facility];  Service: Gastroenterology;;    Family History family history includes Breast cancer in his maternal grandmother; Diabetes in his brother, brother, father, mother, and sister; Heart disease in his brother and son; Hypertension in his mother; Lung disease in his paternal grandfather; Stroke in his mother.  Social History Social History   Socioeconomic History   Marital status: Married    Spouse name: Not on file   Number of children: 3   Years of education: Not on file   Highest education level: GED or equivalent  Occupational History   Occupation: Ship broker, retired    Comment: Firefighter  Tobacco Use   Smoking status: Former    Current packs/day: 0.00    Average packs/day: 1 pack/day for 41.0  years (41.0 ttl pk-yrs)    Types: Cigarettes    Start date: 35    Quit date: 2014    Years since quitting: 11.2   Smokeless tobacco: Never  Vaping Use   Vaping status: Never Used  Substance and Sexual Activity   Alcohol use: No    Alcohol/week: 0.0 standard drinks of alcohol   Drug use: Never   Sexual activity: Not Currently  Other Topics Concern   Not on file  Social History Narrative   Retired Photographer, [employer], aviation fuel, [phone], no known agent orange exposure but 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   Married 1975   3 kids   Social Drivers of Corporate investment banker Strain: Low Risk  (02/11/2023)   Overall Financial Resource Strain (CARDIA)    Difficulty of Paying Living Expenses: Not hard at all  Food Insecurity: Food Insecurity Present (02/11/2023)   Hunger Vital Sign    Worried About Running Out of Food in the Last Year: Sometimes true    Ran Out of Food in the Last Year: Sometimes true  Transportation Needs: No Transportation Needs (02/11/2023)   PRAPARE - Administrator, Civil Service (Medical): No    Lack of Transportation (Non-Medical): No  Physical Activity: Insufficiently Active (02/11/2023)   Exercise Vital Sign    Days of Exercise per Week: 1 day    Minutes of Exercise per Session: 10 min  Stress: No Stress Concern Present (02/11/2023)   [employer] of Occupational Health - Occupational Stress Questionnaire    Feeling of Stress : Not at all  Social Connections: Moderately Isolated (02/11/2023)   Social  Connection and Isolation Panel [NHANES]    Frequency of Communication with Friends and Family: Twice a week    Frequency of Social Gatherings with Friends and Family: Twice a week    Attends Religious Services: Never    Database administrator or Organizations: No    Attends Banker Meetings: Never    Marital Status: Married  Catering manager Violence: Not At Risk (02/06/2023)   Humiliation, Afraid, Rape, and Kick  questionnaire    Fear of Current or Ex-Partner: No    Emotionally Abused: No    Physically Abused: No    Sexually Abused: No    Lab Results  Component Value Date   HGBA1C 6.3 (A) 03/01/2023   HGBA1C 5.8 (A) 11/21/2022   HGBA1C 6.3 08/14/2022   Lab Results  Component Value Date   CHOL 124 10/09/2022   Lab Results  Component Value Date   HDL 33.50 (L) 10/09/2022   Lab Results  Component Value Date   LDLCALC 52 10/09/2022   Lab Results  Component Value Date   TRIG 195.0 (H) 10/09/2022   Lab Results  Component Value Date   CHOLHDL 4 10/09/2022   Lab Results  Component Value Date   CREATININE 1.23 10/09/2022   Lab Results  Component Value Date   GFR 59.36 (L) 10/09/2022   Lab Results  Component Value Date   MICROALBUR 2.8 (H) 10/09/2022      Component Value Date/Time   NA 141 10/09/2022 0801   NA 142 08/14/2019 1136   K 4.9 10/09/2022 0801   CL 104 10/09/2022 0801   CO2 31 10/09/2022 0801   GLUCOSE 78 10/09/2022 0801   BUN 20 10/09/2022 0801   BUN 31 (H) 08/14/2019 1136   CREATININE 1.23 10/09/2022 0801   CALCIUM 9.9 10/09/2022 0801   PROT 7.6 10/09/2022 0801   PROT 7.2 09/28/2020 1452   ALBUMIN 4.1 10/09/2022 0801   ALBUMIN 4.1 09/28/2020 1452   AST 27 10/09/2022 0801   ALT 28 10/09/2022 0801   ALKPHOS 64 10/09/2022 0801   BILITOT 0.6 10/09/2022 0801   BILITOT 0.3 09/28/2020 1452   GFRNONAA 63 08/14/2019 1136   GFRAA 73 08/14/2019 1136      Latest Ref Rng & Units 10/09/2022    8:01 AM 08/14/2022   11:00 AM 05/12/2022   10:51 AM  BMP  Glucose 70 - 99 mg/dL 78  54  161   BUN 6 - 23 mg/dL 20  16  20    Creatinine 0.40 - 1.50 mg/dL 0.96  0.45  4.09   Sodium 135 - 145 mEq/L 141  139  142   Potassium 3.5 - 5.1 mEq/L 4.9  4.2  4.6   Chloride 96 - 112 mEq/L 104  104  105   CO2 19 - 32 mEq/L 31  27  25    Calcium 8.4 - 10.5 mg/dL 9.9  9.6  9.6        Component Value Date/Time   WBC 7.3 02/12/2023 1221   RBC 5.14 02/12/2023 1221   HGB 16.6  02/12/2023 1221   HGB 13.6 08/14/2019 1136   HCT 50.4 02/12/2023 1221   HCT 39.4 08/14/2019 1136   PLT 196.0 02/12/2023 1221   PLT 148 (L) 08/14/2019 1136   MCV 98.0 02/12/2023 1221   MCV 98 (H) 08/14/2019 1136   MCH 33.8 (H) 08/14/2019 1136   MCH 21.8 (L) 04/27/2015 1016   MCHC 33.0 02/12/2023 1221   RDW 13.0 02/12/2023 1221  RDW 12.4 08/14/2019 1136   LYMPHSABS 1.3 02/12/2023 1221   MONOABS 0.9 02/12/2023 1221   EOSABS 0.2 02/12/2023 1221   BASOSABS 0.0 02/12/2023 1221     Parts of this note may have been dictated using voice recognition software. There may be variances in spelling and vocabulary which are unintentional. Not all errors are proofread. Please notify the [facility] if any discrepancies are noted or if the meaning of any statement is not clear.

## 2023-05-17 NOTE — Patient Instructions (Addendum)
 Will recommend the following: Toujeo 66 units once at night Novolog insulin 25 units before break fast, 26 units before lunch and 28 units before dinner 15 min before meals Synjardy 12.05/998 twice a day     Continue Trulicity 4.5 mg weekly

## 2023-05-18 LAB — COMPREHENSIVE METABOLIC PANEL WITH GFR
AG Ratio: 1.6 (calc) (ref 1.0–2.5)
ALT: 62 U/L — ABNORMAL HIGH (ref 9–46)
AST: 61 U/L — ABNORMAL HIGH (ref 10–35)
Albumin: 4.5 g/dL (ref 3.6–5.1)
Alkaline phosphatase (APISO): 70 U/L (ref 35–144)
BUN: 14 mg/dL (ref 7–25)
CO2: 29 mmol/L (ref 20–32)
Calcium: 9.8 mg/dL (ref 8.6–10.3)
Chloride: 103 mmol/L (ref 98–110)
Creat: 0.98 mg/dL (ref 0.70–1.28)
Globulin: 2.8 g/dL (ref 1.9–3.7)
Glucose, Bld: 133 mg/dL — ABNORMAL HIGH (ref 65–99)
Potassium: 4.6 mmol/L (ref 3.5–5.3)
Sodium: 140 mmol/L (ref 135–146)
Total Bilirubin: 0.6 mg/dL (ref 0.2–1.2)
Total Protein: 7.3 g/dL (ref 6.1–8.1)
eGFR: 82 mL/min/{1.73_m2} (ref >=60)

## 2023-05-21 ENCOUNTER — Telehealth: Payer: Self-pay

## 2023-05-21 ENCOUNTER — Encounter: Payer: Self-pay | Admitting: "Endocrinology

## 2023-05-21 ENCOUNTER — Other Ambulatory Visit (HOSPITAL_COMMUNITY): Payer: Self-pay

## 2023-05-21 NOTE — Telephone Encounter (Signed)
 Pharmacy Patient Advocate Encounter   Received notification from Pt Calls Messages that prior authorization for Trulicity  is required/requested.   Insurance verification completed.   The patient is insured through [facility] and [facility].   Per test claim: The current 30 day co-pay is, $47.  No PA needed at this time. This test claim was processed through [facility]- copay amounts may vary at other pharmacies due to pharmacy/plan contracts, or as the patient moves through the different stages of their insurance plan.

## 2023-05-21 NOTE — Telephone Encounter (Signed)
 Pt needs PA done for Trulicity .

## 2023-05-22 ENCOUNTER — Other Ambulatory Visit: Payer: Self-pay

## 2023-05-22 DIAGNOSIS — E11649 Type 2 diabetes mellitus with hypoglycemia without coma: Secondary | ICD-10-CM

## 2023-05-22 MED ORDER — TRULICITY 4.5 MG/0.5ML ~~LOC~~ SOAJ: 4.5000 mg | SUBCUTANEOUS | 1 refills | Status: DC

## 2023-05-23 ENCOUNTER — Telehealth: Payer: Self-pay

## 2023-05-23 NOTE — Telephone Encounter (Signed)
 Shoes and inserts are in / docs valid until 7/11 / appt set 07/05/23

## 2023-06-01 ENCOUNTER — Ambulatory Visit (INDEPENDENT_AMBULATORY_CARE_PROVIDER_SITE_OTHER): Admitting: Family Medicine

## 2023-06-01 ENCOUNTER — Encounter: Payer: Self-pay | Admitting: Family Medicine

## 2023-06-01 VITALS — BP 110/58 | HR 86 | Temp 99.1°F | Ht 70.0 in | Wt 192.6 lb

## 2023-06-01 DIAGNOSIS — E11649 Type 2 diabetes mellitus with hypoglycemia without coma: Secondary | ICD-10-CM

## 2023-06-01 DIAGNOSIS — M545 Low back pain, unspecified: Secondary | ICD-10-CM | POA: Diagnosis not present

## 2023-06-01 MED ORDER — NOVOLOG FLEXPEN 100 UNIT/ML ~~LOC~~ SOPN: PEN_INJECTOR | SUBCUTANEOUS | Status: DC

## 2023-06-01 MED ORDER — TOUJEO MAX SOLOSTAR 300 UNIT/ML ~~LOC~~ SOPN: 66.0000 [IU] | PEN_INJECTOR | Freq: Every day | SUBCUTANEOUS | Status: DC

## 2023-06-01 MED ORDER — TIZANIDINE HCL 2 MG PO TABS
1.0000 mg | ORAL_TABLET | Freq: Three times a day (TID) | ORAL | 1 refills | Status: DC | PRN
Start: 2023-06-01 — End: 2023-06-27

## 2023-06-01 NOTE — Patient Instructions (Signed)
 Try tizanidine  instead of flexeril  and keep using ice and heat.  Stretch gently and update me as needed.  Take care.  Glad to see you.

## 2023-06-01 NOTE — Progress Notes (Unsigned)
 Parking form done for patient.  Given to patient at OV.    Med list update re: insulin .    Back pain, had been working in the yard, he thought he pulled a muscle.  Not having cramping in the legs.  R lower back pain, radiates to the R thigh but not past the knee.  Normal sensation.  No bruising.  No falls.  No L leg sx.  No L lower back pain.  No midline pain.  No B/B sx.  More pain with movement.  Flexeril  helps a little.  Taking tramadol  for pain at baseline.  This doesn't feel like prev sciatica.   Meds, vitals, and allergies reviewed.   ROS: Per HPI unless specifically indicated in ROS section   Nad Ncat Rrr Ctab Back nontender midline.  Right lower back tender to palpation. Normal hip range of motion unable to bear weight.

## 2023-06-03 NOTE — Assessment & Plan Note (Signed)
 Likely muscle strain.  Defer imaging.  Okay for outpatient follow-up. Try tizanidine  instead of flexeril  and keep using ice and heat.  Stretch gently and update me as needed.  He agrees to plan.

## 2023-06-08 NOTE — Telephone Encounter (Signed)
 error

## 2023-06-24 ENCOUNTER — Other Ambulatory Visit: Payer: Self-pay | Admitting: Family Medicine

## 2023-06-28 ENCOUNTER — Ambulatory Visit (INDEPENDENT_AMBULATORY_CARE_PROVIDER_SITE_OTHER): Admitting: "Endocrinology

## 2023-06-28 ENCOUNTER — Encounter: Payer: Self-pay | Admitting: "Endocrinology

## 2023-06-28 VITALS — BP 120/80 | HR 103 | Ht 70.0 in | Wt 191.0 lb

## 2023-06-28 DIAGNOSIS — Z7985 Long-term (current) use of injectable non-insulin antidiabetic drugs: Secondary | ICD-10-CM | POA: Diagnosis not present

## 2023-06-28 DIAGNOSIS — Z794 Long term (current) use of insulin: Secondary | ICD-10-CM | POA: Diagnosis not present

## 2023-06-28 DIAGNOSIS — E11649 Type 2 diabetes mellitus with hypoglycemia without coma: Secondary | ICD-10-CM

## 2023-06-28 DIAGNOSIS — E782 Mixed hyperlipidemia: Secondary | ICD-10-CM

## 2023-06-28 DIAGNOSIS — Z7984 Long term (current) use of oral hypoglycemic drugs: Secondary | ICD-10-CM

## 2023-06-28 LAB — POCT GLYCOSYLATED HEMOGLOBIN (HGB A1C): Hemoglobin A1C: 6.1 % — AB (ref 4.0–5.6)

## 2023-06-28 NOTE — Progress Notes (Signed)
 Outpatient Endocrinology Note Anthony Newcomer, MD  06/28/23   Anthony Cuevas April 03, 1950 [id number]  Referring Provider: [name], MD Primary Care Provider: [name], MD Reason for consultation: Subjective   Assessment & Plan  Diagnoses and all orders for this visit:  Uncontrolled type 2 diabetes mellitus with hypoglycemia without coma (HCC) -     POCT glycosylated hemoglobin (Hb A1C)  Long term (current) use of oral hypoglycemic drugs  Long-term (current) use of injectable non-insulin  antidiabetic drugs  Long-term insulin  use (HCC)  Mixed hypercholesterolemia and hypertriglyceridemia   Diabetes Type 2 complicated by neuropathy   Lab Results  Component Value Date   GFR 59.36 (L) 10/09/2022   Hba1c goal less than 7, current Hba1c is  Lab Results  Component Value Date   HGBA1C 6.1 (A) 06/28/2023   Will recommend the following: Toujeo  66 units once at night (cut it to 60 units if BG drop less than 80) Novolog  insulin  25 units before break fast, 26 units before lunch and 28 units before dinner 15 min before meals Synjardy  12.05/998 twice a day     Continue Trulicity  4.5 mg weekly (mounjaro  not covered) S/p nutrition therapy Follows with [facility]iatry [facility], has a stent one leg and had vascular studies done per patient   No known contraindications/side effects to any of above medications Glucagon  ordered with refills on 07/18/22 No history of MEN syndrome/medullary thyroid  cancer/pancreatitis or pancreatic cancer in self or family  -Last LD and Tg are as follows: Lab Results  Component Value Date   LDLCALC 52 10/09/2022    Lab Results  Component Value Date   TRIG 195.0 (H) 10/09/2022   -Recommend rosuvastatin  20 mg QD -Follow low fat diet and exercise   -Blood pressure goal <140/90 - Microalbumin/creatinine goal < 30 -Last MA/Cr is as follows: Lab Results  Component Value Date   MICROALBUR 2.8 (H) 10/09/2022    - on ACE/ARB lisinopril  40 mg qd -diet changes including salt restriction -limit eating outside -counseled BP targets per standards of diabetes care -uncontrolled blood pressure can lead to retinopathy, nephropathy and cardiovascular and atherosclerotic heart disease  Reviewed and counseled on: -A1C target -Blood sugar targets -Complications of uncontrolled diabetes  -Checking blood sugar before meals and bedtime and bring log next visit -All medications with mechanism of action and side effects -Hypoglycemia management: rule of 15's, Glucagon  Emergency Kit and medical alert ID -low-carb low-fat plate-method diet -At least 20 minutes of physical activity per day -Annual dilated retinal eye exam and foot exam -compliance and follow up needs -follow up as scheduled or earlier if problem gets worse  Call if blood sugar is less than 70 or consistently above 250    Take a 15 gm snack of carbohydrate at bedtime before you go to sleep if your blood sugar is less than 100.    If you are going to fast after midnight for a test or procedure, ask your physician for instructions on how to reduce/decrease your insulin  dose.    Call if blood sugar is less than 70 or consistently above 250  -Treating a low sugar by rule of 15  (15 gms of sugar every 15 min until sugar is more than 70) If you feel your sugar is low, test your sugar to be sure If your sugar is low (less than 70), then take 15 grams of a fast acting Carbohydrate (3-4 glucose tablets or glucose gel or 4 ounces of  juice or regular soda) Recheck your sugar 15 min after treating low to make sure it is more than 70 If sugar is still less than 70, treat again with 15 grams of carbohydrate          Don't drive the hour of hypoglycemia  If unconscious/unable to eat or drink by mouth, use glucagon  injection or nasal spray baqsimi and call 911. Can repeat again in 15 min if still unconscious.  Return in about 3 months (around  09/28/2023).   I have reviewed current medications, nurse's notes, allergies, vital signs, past medical and surgical history, family medical history, and social history for this encounter. Counseled patient on symptoms, examination findings, lab findings, imaging results, treatment decisions and monitoring and prognosis. The patient understood the recommendations and agrees with the treatment plan. All questions regarding treatment plan were fully answered.  Anthony Newcomer, MD  06/28/23   History of Present Illness OKIE BOGACZ is a 73 y.o. year old male who presents for follow up of Type 2 diabetes mellitus.  Anthony Cuevas was first diagnosed in 25.   Diabetes education +  Home diabetes regimen: Toujeo  66 units once at night Novolog  insulin  25 units before break fast, 26 units before lunch and 28 units before dinner 15 min before meals Synjardy  12.05/998 twice a day     Trulicity  4.5 mg weekly   Previous history: Non-insulin  hypoglycemic drugs previously used: Actos , metformin  stopped in 5/23, glipizide  Insulin  was started in 2006 and has taken Lantus , NovoLog  as well as NPH and regular  COMPLICATIONS -  MI/Stroke -  retinopathy +  neuropathy -  nephropathy  BLOOD SUGAR DATA  CGM interpretation: At today's visit, we reviewed her CGM downloads. The full report is scanned in the media. Reviewing the CGM trends, BG are running on the low side overnight-afternoon but in the last 3 days since patient has made dietary changes and eating more, his BG have been normal-high. Patient plans to maintain current diet.   Physical Exam  BP 120/80   Pulse (!) 103   Ht 5\' 10"  (1.778 m)   Wt 191 lb (86.6 kg)   SpO2 96%   BMI 27.41 kg/m    Constitutional: well developed, well nourished Head: normocephalic, atraumatic Eyes: sclera anicteric, no redness Neck: supple Lungs: normal respiratory effort Neurology: alert and oriented Skin: dry, no appreciable rashes Musculoskeletal:  no appreciable defects Psychiatric: normal mood and affect Diabetic Foot Exam - Simple   No data filed      Current Medications Patient's Medications  New Prescriptions   No medications on file  Previous Medications   ASCORBIC ACID  (VITAMIN C) 500 MG TABLET    Take 1 tablet (500 mg total) by mouth daily.   CHOLECALCIFEROL  (VITAMIN D3) 125 MCG (5000 UT) CAPS    Take 5,000 Units by mouth 2 (two) times a week.   CLOPIDOGREL  (PLAVIX ) 75 MG TABLET    Take 1 tablet (75 mg total) by mouth daily.   CONTINUOUS GLUCOSE SENSOR (FREESTYLE LIBRE 3 SENSOR) MISC    Place 1 sensor on the skin every 14 days. Use to check glucose continuously   CYANOCOBALAMIN  1000 MCG TABLET    Take 1,000 mcg by mouth daily.   DULAGLUTIDE  (TRULICITY ) 4.5 MG/0.5ML SOAJ    Inject 4.5 mg as directed once a week.   EMPAGLIFLOZIN -METFORMIN  HCL ER (SYNJARDY  XR) 12.05-998 MG TB24    Take 2 tablets by mouth daily.   FERROUS SULFATE  325 (65 FE) MG TABLET  TAKE 1 TABLET ORALLY DAILY ON SUNDAY, WEDNESDAY (2 TABLETS PER WEEK)   GLUCAGON  (GVOKE HYPOPEN  1-PACK) 1 MG/0.2ML SOAJ    Inject 1 mg into the skin as needed (low blood sugar with imaopired consciousness).   GLUCOSE BLOOD (ONETOUCH ULTRA) TEST STRIP    USE AS INSTRUCTED TO TEST BLOOD SUGARS 3 OR 4 TIMES DAILY   INSULIN  ASPART (NOVOLOG  FLEXPEN) 100 UNIT/ML FLEXPEN    25 units AM, 26 units lunch, 28 units at supper.   INSULIN  GLARGINE, 2 UNIT DIAL, (TOUJEO  MAX SOLOSTAR) 300 UNIT/ML SOLOSTAR PEN    Inject 66 Units into the skin daily.   INSULIN  PEN NEEDLE (SURE COMFORT PEN NEEDLES) 32G X 4 MM MISC    USE 4 TIMES A DAY   LATANOPROST  (XALATAN ) 0.005 % OPHTHALMIC SOLUTION    Place 1 drop into both eyes at bedtime.   LISINOPRIL  (ZESTRIL ) 40 MG TABLET    Take 1 tablet (40 mg total) by mouth daily.   OMEGA-3 FATTY ACIDS  (FISH OIL PO)    Take 1,000 mg by mouth once a week.   ROSUVASTATIN  (CRESTOR ) 20 MG TABLET    Take 1 tablet (20 mg total) by mouth daily.   TIZANIDINE  (ZANAFLEX ) 2 MG  TABLET    TAKE 0.5-1 TABLETS (1-2 MG TOTAL) BY MOUTH EVERY 8 (EIGHT) HOURS AS NEEDED FOR MUSCLE SPASMS (USE INSTEAD OF FLEXERIL . SEDATION CAUTION.).   TRAMADOL  (ULTRAM ) 50 MG TABLET    TAKE 1 TABLET TWO TO THREE TIMES A DAY AS NEEDED FOR MODERATE PAIN  Modified Medications   No medications on file  Discontinued Medications   No medications on file    Allergies No Known Allergies  Past Medical History Past Medical History:  Diagnosis Date   Arthritis    Coronary atherosclerosis    a. 07/2018 CT Chest: 3 vessel coronary atherosclerosis; b. 07/2018 MV: EF 46% (GI uptake artifact), No ischemia/infarct. Mild to mod diffuse Ao atherosclerosis and at least moderate 2 vessel CAD (LAD/RCA) on CT imaging. Low risk.   Depression    improved on sertraline    Diabetes mellitus, type 2 (HCC) 09/1996   GERD (gastroesophageal reflux disease) 1990   History of peptic ulcer disease    Hyperlipidemia 1992   Hypertension 05/2003   Lumbar stenosis L2-3   Muscle atrophy    in arms from degenerative changes in neck   Neck pain    chronic   Smoker    Wears glasses     Past Surgical History Past Surgical History:  Procedure Laterality Date   ABDOMINAL AORTOGRAM W/LOWER EXTREMITY N/A 09/03/2019   Procedure: ABDOMINAL AORTOGRAM W/LOWER EXTREMITY;  Surgeon: Wenona Hamilton, MD;  Location: [facility];  Service: Cardiovascular;  Laterality: N/A;  Limited Study   CERVICAL DISC SURGERY  04/19/2002   fusion, Dr. Murrel Arnt   CERVICAL DISCECTOMY  01/31/1995   partial   CERVICAL DISCECTOMY  01/30/1998   COLONOSCOPY WITH ESOPHAGOGASTRODUODENOSCOPY (EGD)  06/25/2015   ENTEROSCOPY N/A 07/04/2021   Procedure: ENTEROSCOPY;  Surgeon: Albertina Hugger, MD;  Location: [facility];  Service: Gastroenterology;  Laterality: N/A;   HEMORRHOID SURGERY  01/31/1987   HOT HEMOSTASIS N/A 07/04/2021   Procedure: HOT HEMOSTASIS (ARGON PLASMA COAGULATION/BICAP);  Surgeon: Albertina Hugger, MD;  Location: [facility];   Service: Gastroenterology;  Laterality: N/A;   KNEE ARTHROPLASTY  11/20/2011   Procedure: COMPUTER ASSISTED TOTAL KNEE ARTHROPLASTY;  Surgeon: Adah Acron, MD;  Location: [facility];  Service: Orthopedics;  Laterality: Left;  Conversion Left Knee Medial Uni to Total Knee Arthroplasty-Cemented   LUMBAR LAMINECTOMY/DECOMPRESSION MICRODISCECTOMY N/A 04/28/2015   Procedure: L2-3 Decompression;  Surgeon: Adah Acron, MD;  Location: [facility];  Service: Orthopedics;  Laterality: N/A;   MR MRA DUPLICATE EXAM  INACTIVATE     MULTIPLE TOOTH EXTRACTIONS     Neisen funduplication  01/31/1988   PERIPHERAL VASCULAR INTERVENTION  09/03/2019   Procedure: PERIPHERAL VASCULAR INTERVENTION;  Surgeon: Wenona Hamilton, MD;  Location: [facility];  Service: Cardiovascular;;  Iliac Stent   REPLACEMENT TOTAL KNEE Left 02/14/02, 05/04/02   left- partial   SCC EXCISION  04/25/2001   Dr. Marlow Sin   STOMACH SURGERY  01/30/1982   [facility], [facility]   SUBMUCOSAL TATTOO INJECTION  07/04/2021   Procedure: SUBMUCOSAL TATTOO INJECTION;  Surgeon: Albertina Hugger, MD;  Location: [facility];  Service: Gastroenterology;;    Family History family history includes Breast cancer in his maternal grandmother; Diabetes in his brother, brother, father, mother, and sister; Heart disease in his brother and son; Hypertension in his mother; Lung disease in his paternal grandfather; Stroke in his mother.  Social History Social History   Socioeconomic History   Marital status: Married    Spouse name: Not on file   Number of children: 3   Years of education: Not on file   Highest education level: GED or equivalent  Occupational History   Occupation: Ship broker, retired    Comment: Firefighter  Tobacco Use   Smoking status: Former    Current packs/day: 0.00    Average packs/day: 1 pack/day for 41.0 years (41.0 ttl pk-yrs)    Types: Cigarettes    Start date: 50    Quit date: 2014    Years since quitting: 11.4   Smokeless  tobacco: Never  Vaping Use   Vaping status: Never Used  Substance and Sexual Activity   Alcohol use: No    Alcohol/week: 0.0 standard drinks of alcohol   Drug use: Never   Sexual activity: Not Currently  Other Topics Concern   Not on file  Social History Narrative   Retired Photographer, [employer], aviation fuel, [phone], no known agent orange exposure but 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   Married 1975   3 kids   Social Drivers of Corporate investment banker Strain: Low Risk  (02/11/2023)   Overall Financial Resource Strain (CARDIA)    Difficulty of Paying Living Expenses: Not hard at all  Food Insecurity: Food Insecurity Present (02/11/2023)   Hunger Vital Sign    Worried About Running Out of Food in the Last Year: Sometimes true    Ran Out of Food in the Last Year: Sometimes true  Transportation Needs: No Transportation Needs (02/11/2023)   PRAPARE - Administrator, Civil Service (Medical): No    Lack of Transportation (Non-Medical): No  Physical Activity: Insufficiently Active (02/11/2023)   Exercise Vital Sign    Days of Exercise per Week: 1 day    Minutes of Exercise per Session: 10 min  Stress: No Stress Concern Present (02/11/2023)   [employer] of Occupational Health - Occupational Stress Questionnaire    Feeling of Stress : Not at all  Social Connections: Moderately Isolated (02/11/2023)   Social Connection and Isolation Panel [NHANES]    Frequency of Communication with Friends and Family: Twice a week    Frequency of Social Gatherings with Friends and Family: Twice a week    Attends  Religious Services: Never    Active Member of Clubs or Organizations: No    Attends Banker Meetings: Never    Marital Status: Married  Catering manager Violence: Not At Risk (02/06/2023)   Humiliation, Afraid, Rape, and Kick questionnaire    Fear of Current or Ex-Partner: No    Emotionally Abused: No    Physically Abused: No    Sexually Abused: No     Lab Results  Component Value Date   HGBA1C 6.1 (A) 06/28/2023   HGBA1C 6.3 (A) 03/01/2023   HGBA1C 5.8 (A) 11/21/2022   Lab Results  Component Value Date   CHOL 124 10/09/2022   Lab Results  Component Value Date   HDL 33.50 (L) 10/09/2022   Lab Results  Component Value Date   LDLCALC 52 10/09/2022   Lab Results  Component Value Date   TRIG 195.0 (H) 10/09/2022   Lab Results  Component Value Date   CHOLHDL 4 10/09/2022   Lab Results  Component Value Date   CREATININE 0.98 05/17/2023   Lab Results  Component Value Date   GFR 59.36 (L) 10/09/2022   Lab Results  Component Value Date   MICROALBUR 2.8 (H) 10/09/2022      Component Value Date/Time   NA 140 05/17/2023 1450   NA 142 08/14/2019 1136   K 4.6 05/17/2023 1450   CL 103 05/17/2023 1450   CO2 29 05/17/2023 1450   GLUCOSE 133 (H) 05/17/2023 1450   BUN 14 05/17/2023 1450   BUN 31 (H) 08/14/2019 1136   CREATININE 0.98 05/17/2023 1450   CALCIUM  9.8 05/17/2023 1450   PROT 7.3 05/17/2023 1450   PROT 7.2 09/28/2020 1452   ALBUMIN 4.1 10/09/2022 0801   ALBUMIN 4.1 09/28/2020 1452   AST 61 (H) 05/17/2023 1450   ALT 62 (H) 05/17/2023 1450   ALKPHOS 64 10/09/2022 0801   BILITOT 0.6 05/17/2023 1450   BILITOT 0.3 09/28/2020 1452   GFRNONAA 63 08/14/2019 1136   GFRAA 73 08/14/2019 1136      Latest Ref Rng & Units 05/17/2023    2:50 PM 10/09/2022    8:01 AM 08/14/2022   11:00 AM  BMP  Glucose 65 - 99 mg/dL 829  78  54   BUN 7 - 25 mg/dL 14  20  16    Creatinine 0.70 - 1.28 mg/dL 5.62  1.30  8.65   BUN/Creat Ratio 6 - 22 (calc) SEE NOTE:     Sodium 135 - 146 mmol/L 140  141  139   Potassium 3.5 - 5.3 mmol/L 4.6  4.9  4.2   Chloride 98 - 110 mmol/L 103  104  104   CO2 20 - 32 mmol/L 29  31  27    Calcium  8.6 - 10.3 mg/dL 9.8  9.9  9.6        Component Value Date/Time   WBC 7.3 02/12/2023 1221   RBC 5.14 02/12/2023 1221   HGB 16.6 02/12/2023 1221   HGB 13.6 08/14/2019 1136   HCT 50.4 02/12/2023 1221    HCT 39.4 08/14/2019 1136   PLT 196.0 02/12/2023 1221   PLT 148 (L) 08/14/2019 1136   MCV 98.0 02/12/2023 1221   MCV 98 (H) 08/14/2019 1136   MCH 33.8 (H) 08/14/2019 1136   MCH 21.8 (L) 04/27/2015 1016   MCHC 33.0 02/12/2023 1221   RDW 13.0 02/12/2023 1221   RDW 12.4 08/14/2019 1136   LYMPHSABS 1.3 02/12/2023 1221   MONOABS 0.9 02/12/2023 1221  EOSABS 0.2 02/12/2023 1221   BASOSABS 0.0 02/12/2023 1221     Parts of this note may have been dictated using voice recognition software. There may be variances in spelling and vocabulary which are unintentional. Not all errors are proofread. Please notify the [facility] if any discrepancies are noted or if the meaning of any statement is not clear.

## 2023-06-28 NOTE — Patient Instructions (Signed)

## 2023-07-01 DIAGNOSIS — E1165 Type 2 diabetes mellitus with hyperglycemia: Secondary | ICD-10-CM | POA: Diagnosis not present

## 2023-07-04 ENCOUNTER — Ambulatory Visit: Admitting: Podiatry

## 2023-07-05 ENCOUNTER — Ambulatory Visit: Admitting: Podiatry

## 2023-07-05 ENCOUNTER — Other Ambulatory Visit

## 2023-07-09 ENCOUNTER — Ambulatory Visit (INDEPENDENT_AMBULATORY_CARE_PROVIDER_SITE_OTHER): Admitting: Podiatry

## 2023-07-09 ENCOUNTER — Encounter: Payer: Self-pay | Admitting: Podiatry

## 2023-07-09 DIAGNOSIS — E119 Type 2 diabetes mellitus without complications: Secondary | ICD-10-CM

## 2023-07-09 DIAGNOSIS — B351 Tinea unguium: Secondary | ICD-10-CM | POA: Diagnosis not present

## 2023-07-09 DIAGNOSIS — M79676 Pain in unspecified toe(s): Secondary | ICD-10-CM

## 2023-07-09 NOTE — Progress Notes (Signed)
This patient returns to my office for at risk foot care.  This patient requires this care by a professional since this patient will be at risk due to having type 2 diabetes.  This patient is unable to cut nails himself since the patient cannot reach his nails.These nails are painful walking and wearing shoes.  This patient presents for at risk foot care today.  General Appearance  Alert, conversant and in no acute stress.  Vascular  Dorsalis pedis and posterior tibial  pulses are palpable  bilaterally.  Capillary return is within normal limits  bilaterally. Temperature is within normal limits  bilaterally.  Neurologic  Senn-Weinstein monofilament wire test diminished  bilaterally. Muscle power within normal limits bilaterally.  Nails Thick disfigured discolored nails with subungual debris  from hallux to fifth toes bilaterally. No evidence of bacterial infection or drainage bilaterally.  Orthopedic  No limitations of motion  feet .  No crepitus or effusions noted.  No bony pathology or digital deformities noted.  HAV  B/L.  Skin  normotropic skin with no porokeratosis noted bilaterally.  No signs of infections or ulcers noted.     Onychomycosis  Pain in right toes  Pain in left toes  Consent was obtained for treatment procedures.   Mechanical debridement of nails 1-5  bilaterally performed with a nail nipper.  Filed with dremel without incident.    Return office visit    10   weeks                  Told patient to return for periodic foot care and evaluation due to potential at risk complications.   Everette Mall DPM  

## 2023-07-17 DIAGNOSIS — E1165 Type 2 diabetes mellitus with hyperglycemia: Secondary | ICD-10-CM | POA: Diagnosis not present

## 2023-07-17 DIAGNOSIS — E119 Type 2 diabetes mellitus without complications: Secondary | ICD-10-CM | POA: Diagnosis not present

## 2023-07-20 ENCOUNTER — Other Ambulatory Visit: Payer: Self-pay | Admitting: Family Medicine

## 2023-07-20 ENCOUNTER — Other Ambulatory Visit: Payer: Self-pay | Admitting: "Endocrinology

## 2023-07-20 NOTE — Telephone Encounter (Signed)
 Tizanidine  last filled 06-27-23 #90 Last OV 06-01-23 Next OV 02-07-24 [facility]

## 2023-07-23 NOTE — Telephone Encounter (Signed)
 Requested Prescriptions   Pending Prescriptions Disp Refills   SYNJARDY  XR 12.05-998 MG TB24 [Pharmacy Med Name: SYNJARDY  XR 12.5-1,000 MG TAB] 60 tablet 1    Sig: TAKE 2 TABLETS BY MOUTH EVERY DAY

## 2023-07-29 ENCOUNTER — Other Ambulatory Visit: Payer: Self-pay | Admitting: Family Medicine

## 2023-08-04 ENCOUNTER — Other Ambulatory Visit (HOSPITAL_COMMUNITY): Payer: Self-pay

## 2023-08-08 ENCOUNTER — Ambulatory Visit (INDEPENDENT_AMBULATORY_CARE_PROVIDER_SITE_OTHER)

## 2023-08-08 DIAGNOSIS — M2142 Flat foot [pes planus] (acquired), left foot: Secondary | ICD-10-CM

## 2023-08-08 DIAGNOSIS — E119 Type 2 diabetes mellitus without complications: Secondary | ICD-10-CM | POA: Diagnosis not present

## 2023-08-08 DIAGNOSIS — M2010 Hallux valgus (acquired), unspecified foot: Secondary | ICD-10-CM

## 2023-08-08 DIAGNOSIS — M2141 Flat foot [pes planus] (acquired), right foot: Secondary | ICD-10-CM | POA: Diagnosis not present

## 2023-08-08 NOTE — Progress Notes (Signed)
 Patient presents today to pick up diabetic shoes and insoles.  Patient was dispensed 1 pair of diabetic shoes and 3 pairs of total contact diabetic insoles. Fit was satisfactory. Instructions for break-in and wear was reviewed and a copy was given to the patient.   Re-appointment for regularly scheduled diabetic foot care visits or if they should experience any trouble with the shoes or insoles.  Britton Cane CPed, CFo, CFm

## 2023-09-12 ENCOUNTER — Other Ambulatory Visit: Payer: Self-pay | Admitting: *Deleted

## 2023-09-12 DIAGNOSIS — I739 Peripheral vascular disease, unspecified: Secondary | ICD-10-CM

## 2023-09-17 ENCOUNTER — Other Ambulatory Visit: Payer: Self-pay

## 2023-09-28 ENCOUNTER — Other Ambulatory Visit

## 2023-09-29 DIAGNOSIS — E1165 Type 2 diabetes mellitus with hyperglycemia: Secondary | ICD-10-CM | POA: Diagnosis not present

## 2023-09-29 LAB — LIPID PANEL
Cholesterol: 106 mg/dL (ref <200)
HDL: 33 mg/dL — ABNORMAL LOW (ref >=40)
LDL Cholesterol (Calc): 43 mg/dL
Non-HDL Cholesterol (Calc): 73 mg/dL (ref <130)
Total CHOL/HDL Ratio: 3.2 (calc) (ref <5.0)
Triglycerides: 256 mg/dL — ABNORMAL HIGH (ref <150)

## 2023-09-29 LAB — COMPLETE METABOLIC PANEL WITHOUT GFR
AG Ratio: 1.8 (calc) (ref 1.0–2.5)
ALT: 24 U/L (ref 9–46)
AST: 20 U/L (ref 10–35)
Albumin: 4.7 g/dL (ref 3.6–5.1)
Alkaline phosphatase (APISO): 60 U/L (ref 35–144)
BUN: 19 mg/dL (ref 7–25)
CO2: 30 mmol/L (ref 20–32)
Calcium: 9.6 mg/dL (ref 8.6–10.3)
Chloride: 102 mmol/L (ref 98–110)
Creat: 1.12 mg/dL (ref 0.70–1.28)
Globulin: 2.6 g/dL (ref 1.9–3.7)
Glucose, Bld: 106 mg/dL — ABNORMAL HIGH (ref 65–99)
Potassium: 4.7 mmol/L (ref 3.5–5.3)
Sodium: 140 mmol/L (ref 135–146)
Total Bilirubin: 0.5 mg/dL (ref 0.2–1.2)
Total Protein: 7.3 g/dL (ref 6.1–8.1)

## 2023-09-29 LAB — HEMOGLOBIN A1C
Hgb A1c MFr Bld: 6.7 % — ABNORMAL HIGH (ref <5.7)
Mean Plasma Glucose: 146 mg/dL
eAG (mmol/L): 8.1 mmol/L

## 2023-09-29 LAB — MICROALBUMIN / CREATININE URINE RATIO
Creatinine, Urine: 90 mg/dL (ref 20–320)
Microalb Creat Ratio: 34 mg/g{creat} — ABNORMAL HIGH (ref <30)
Microalb, Ur: 3.1 mg/dL

## 2023-10-02 ENCOUNTER — Encounter: Payer: Self-pay | Admitting: "Endocrinology

## 2023-10-02 ENCOUNTER — Ambulatory Visit (INDEPENDENT_AMBULATORY_CARE_PROVIDER_SITE_OTHER): Admitting: "Endocrinology

## 2023-10-02 VITALS — BP 130/80 | HR 102 | Ht 70.0 in | Wt 191.0 lb

## 2023-10-02 DIAGNOSIS — E782 Mixed hyperlipidemia: Secondary | ICD-10-CM

## 2023-10-02 DIAGNOSIS — Z7985 Long-term (current) use of injectable non-insulin antidiabetic drugs: Secondary | ICD-10-CM | POA: Diagnosis not present

## 2023-10-02 DIAGNOSIS — Z794 Long term (current) use of insulin: Secondary | ICD-10-CM

## 2023-10-02 DIAGNOSIS — E1165 Type 2 diabetes mellitus with hyperglycemia: Secondary | ICD-10-CM

## 2023-10-02 DIAGNOSIS — Z7984 Long term (current) use of oral hypoglycemic drugs: Secondary | ICD-10-CM

## 2023-10-02 NOTE — Patient Instructions (Signed)
 Will recommend the following: Toujeo  56-58 units once at night  Novolog  insulin  26 units before break fast, 27 units before lunch and 29 units before dinner 15 min before meals Synjardy  12.05/998 twice a day     Continue Trulicity  4.5 mg weekly

## 2023-10-02 NOTE — Progress Notes (Signed)
 Outpatient Endocrinology Note Anthony Birmingham, MD  10/02/23   Anthony Cuevas 06/03/1950 [id number]  Referring Provider: Cleatus Arlyss RAMAN, MD Primary Care Provider: Cleatus Arlyss RAMAN, MD Reason for consultation: Subjective   Assessment & Plan  Diagnoses and all orders for this visit:  Uncontrolled type 2 diabetes mellitus with hyperglycemia, with long-term current use of insulin  (HCC)  Long term (current) use of oral hypoglycemic drugs  Long-term (current) use of injectable non-insulin  antidiabetic drugs  Mixed hypercholesterolemia and hypertriglyceridemia    Diabetes Type 2 complicated by neuropathy   Lab Results  Component Value Date   GFR 59.36 (L) 10/09/2022   Hba1c goal less than 7, current Hba1c is  Lab Results  Component Value Date   HGBA1C 6.7 (H) 09/28/2023   Will recommend the following: Toujeo  56-58 units once at night  Novolog  insulin  26 units before break fast, 27 units before lunch and 29 units before dinner 15 min before meals Synjardy  12.05/998 twice a day     Continue Trulicity  4.5 mg weekly (mounjaro  not covered) S/p nutrition therapy Follows [facility], has a stent one leg and had vascular studies done per patient   No known contraindications/side effects to any of above medications Glucagon  ordered with refills on 07/18/22 No history of MEN syndrome/medullary thyroid  cancer/pancreatitis or pancreatic cancer in self or family  -Last LD and Tg are as follows: Lab Results  Component Value Date   LDLCALC 43 09/28/2023    Lab Results  Component Value Date   TRIG 256 (H) 09/28/2023   -Recommend rosuvastatin  20 mg QD -Follow low fat diet and exercise   -Blood pressure goal <140/90 - Microalbumin/creatinine goal < 30 -Last MA/Cr is as follows: Lab Results  Component Value Date   MICROALBUR 3.1 09/28/2023   - on ACE/ARB lisinopril  40 mg qd -diet changes including salt restriction -limit eating  outside -counseled BP targets per standards of diabetes care -uncontrolled blood pressure can lead to retinopathy, nephropathy and cardiovascular and atherosclerotic heart disease  Reviewed and counseled on: -A1C target -Blood sugar targets -Complications of uncontrolled diabetes  -Checking blood sugar before meals and bedtime and bring log next visit -All medications with mechanism of action and side effects -Hypoglycemia management: rule of 15's, Glucagon  Emergency Kit and medical alert ID -low-carb low-fat plate-method diet -At least 20 minutes of physical activity per day -Annual dilated retinal eye exam and foot exam -compliance and follow up needs -follow up as scheduled or earlier if problem gets worse  Call if blood sugar is less than 70 or consistently above 250    Take a 15 gm snack of carbohydrate at bedtime before you go to sleep if your blood sugar is less than 100.    If you are going to fast after midnight for a test or procedure, ask your physician for instructions on how to reduce/decrease your insulin  dose.    Call if blood sugar is less than 70 or consistently above 250  -Treating a low sugar by rule of 15  (15 gms of sugar every 15 min until sugar is more than 70) If you feel your sugar is low, test your sugar to be sure If your sugar is low (less than 70), then take 15 grams of a fast acting Carbohydrate (3-4 glucose tablets or glucose gel or 4 ounces of juice or regular soda) Recheck your sugar 15 min after treating low to make sure it is more than 70 If  sugar is still less than 70, treat again with 15 grams of carbohydrate          Don't drive the hour of hypoglycemia  If unconscious/unable to eat or drink by mouth, use glucagon  injection or nasal spray baqsimi and call 911. Can repeat again in 15 min if still unconscious.  Return in about 3 months (around 01/01/2024).   I have reviewed current medications, nurse's notes, allergies, vital signs, past medical  and surgical history, family medical history, and social history for this encounter. Counseled patient on symptoms, examination findings, lab findings, imaging results, treatment decisions and monitoring and prognosis. The patient understood the recommendations and agrees with the treatment plan. All questions regarding treatment plan were fully answered.  Anthony Birmingham, MD  10/02/23   History of Present Illness Anthony Cuevas is a 73 y.o. year old male who presents for follow up of Type 2 diabetes mellitus.  Anthony Cuevas was first diagnosed in 27.   Diabetes education +  Home diabetes regimen: Toujeo  60 units once at night Novolog  insulin  25 units before break fast, 26 units before lunch and 28 units before dinner 15 min before meals Synjardy  12.05/998 twice a day     Trulicity  4.5 mg weekly   Previous history: Non-insulin  hypoglycemic drugs previously used: Actos , metformin  stopped in 5/23, glipizide  Insulin  was started in 2006 and has taken Lantus , NovoLog  as well as NPH and regular  COMPLICATIONS -  MI/Stroke -  retinopathy +  neuropathy -  nephropathy  BLOOD SUGAR DATA CGM interpretation: At today's visit, we reviewed her CGM downloads. The full report is scanned in the media. Reviewing the CGM trends, BG are running on the low side overnight- and high on the daytime.  Physical Exam  BP 130/80   Pulse (!) 102   Ht 5' 10 (1.778 m)   Wt 191 lb (86.6 kg)   SpO2 98%   BMI 27.41 kg/m    Constitutional: well developed, well nourished Head: normocephalic, atraumatic Eyes: sclera anicteric, no redness Neck: supple Lungs: normal respiratory effort Neurology: alert and oriented Skin: dry, no appreciable rashes Musculoskeletal: no appreciable defects Psychiatric: normal mood and affect Diabetic Foot Exam - Simple   No data filed      Current Medications Patient's Medications  New Prescriptions   No medications on file  Previous Medications   ASCORBIC ACID   (VITAMIN C) 500 MG TABLET    Take 1 tablet (500 mg total) by mouth daily.   CHOLECALCIFEROL  (VITAMIN D3) 125 MCG (5000 UT) CAPS    Take 5,000 Units by mouth 2 (two) times a week.   CLOPIDOGREL  (PLAVIX ) 75 MG TABLET    Take 1 tablet (75 mg total) by mouth daily.   CONTINUOUS GLUCOSE SENSOR (FREESTYLE LIBRE 3 SENSOR) MISC    Place 1 sensor on the skin every 14 days. Use to check glucose continuously   CYANOCOBALAMIN  1000 MCG TABLET    Take 1,000 mcg by mouth daily.   DULAGLUTIDE  (TRULICITY ) 4.5 MG/0.5ML SOAJ    Inject 4.5 mg as directed once a week.   FERROUS SULFATE  325 (65 FE) MG TABLET    TAKE 1 TABLET ORALLY DAILY ON SUNDAY, WEDNESDAY (2 TABLETS PER WEEK)   GLUCAGON  (GVOKE HYPOPEN  1-PACK) 1 MG/0.2ML SOAJ    Inject 1 mg into the skin as needed (low blood sugar with imaopired consciousness).   GLUCOSE BLOOD (ONETOUCH ULTRA) TEST STRIP    USE AS INSTRUCTED TO TEST BLOOD SUGARS 3 OR 4 TIMES  DAILY   INSULIN  ASPART (NOVOLOG  FLEXPEN) 100 UNIT/ML FLEXPEN    25 units AM, 26 units lunch, 28 units at supper.   INSULIN  GLARGINE, 2 UNIT DIAL, (TOUJEO  MAX SOLOSTAR) 300 UNIT/ML SOLOSTAR PEN    Inject 66 Units into the skin daily.   INSULIN  PEN NEEDLE (SURE COMFORT PEN NEEDLES) 32G X 4 MM MISC    USE 4 TIMES A DAY   LATANOPROST  (XALATAN ) 0.005 % OPHTHALMIC SOLUTION    Place 1 drop into both eyes at bedtime.   LISINOPRIL  (ZESTRIL ) 40 MG TABLET    Take 1 tablet (40 mg total) by mouth daily.   OMEGA-3 FATTY ACIDS  (FISH OIL PO)    Take 1,000 mg by mouth once a week.   ROSUVASTATIN  (CRESTOR ) 20 MG TABLET    Take 1 tablet (20 mg total) by mouth daily.   SYNJARDY  XR 12.05-998 MG TB24    TAKE 2 TABLETS BY MOUTH EVERY DAY   TIZANIDINE  (ZANAFLEX ) 2 MG TABLET    TAKE 0.5-1 TABLETS (1-2 MG TOTAL) BY MOUTH EVERY 8 (EIGHT) HOURS AS NEEDED FOR MUSCLE SPASMS (USE INSTEAD OF FLEXERIL . SEDATION CAUTION.).   TRAMADOL  (ULTRAM ) 50 MG TABLET    TAKE 1 TABLET TWO TO THREE TIMES A DAY AS NEEDED FOR MODERATE PAIN  Modified Medications    No medications on file  Discontinued Medications   No medications on file    Allergies No Known Allergies  Past Medical History Past Medical History:  Diagnosis Date   Arthritis    Coronary atherosclerosis    a. 07/2018 CT Chest: 3 vessel coronary atherosclerosis; b. 07/2018 MV: EF 46% (GI uptake artifact), No ischemia/infarct. Mild to mod diffuse Ao atherosclerosis and at least moderate 2 vessel CAD (LAD/RCA) on CT imaging. Low risk.   Depression    improved on sertraline    Diabetes mellitus, type 2 (HCC) 09/1996   GERD (gastroesophageal reflux disease) 1990   History of peptic ulcer disease    Hyperlipidemia 1992   Hypertension 05/2003   Lumbar stenosis L2-3   Muscle atrophy    in arms from degenerative changes in neck   Neck pain    chronic   Smoker    Wears glasses     Past Surgical History Past Surgical History:  Procedure Laterality Date   ABDOMINAL AORTOGRAM W/LOWER EXTREMITY N/A 09/03/2019   Procedure: ABDOMINAL AORTOGRAM W/LOWER EXTREMITY;  Surgeon: Darron Deatrice LABOR, MD;  Location: [facility];  Service: Cardiovascular;  Laterality: N/A;  Limited Study   CERVICAL DISC SURGERY  04/19/2002   fusion, Dr. Barbarann   CERVICAL DISCECTOMY  01/31/1995   partial   CERVICAL DISCECTOMY  01/30/1998   COLONOSCOPY WITH ESOPHAGOGASTRODUODENOSCOPY (EGD)  06/25/2015   ENTEROSCOPY N/A 07/04/2021   Procedure: ENTEROSCOPY;  Surgeon: Legrand Victory LITTIE DOUGLAS, MD;  Location: [facility];  Service: Gastroenterology;  Laterality: N/A;   HEMORRHOID SURGERY  01/31/1987   HOT HEMOSTASIS N/A 07/04/2021   Procedure: HOT HEMOSTASIS (ARGON PLASMA COAGULATION/BICAP);  Surgeon: Legrand Victory LITTIE DOUGLAS, MD;  Location: [facility];  Service: Gastroenterology;  Laterality: N/A;   KNEE ARTHROPLASTY  11/20/2011   Procedure: COMPUTER ASSISTED TOTAL KNEE ARTHROPLASTY;  Surgeon: Oneil JAYSON Barbarann, MD;  Location: [facility];  Service: Orthopedics;  Laterality: Left;  Conversion Left Knee Medial Uni to Total Knee  Arthroplasty-Cemented   LUMBAR LAMINECTOMY/DECOMPRESSION MICRODISCECTOMY N/A 04/28/2015   Procedure: L2-3 Decompression;  Surgeon: Oneil JAYSON Barbarann, MD;  Location: [facility];  Service: Orthopedics;  Laterality: N/A;   MR MRA DUPLICATE  EXAM  INACTIVATE     MULTIPLE TOOTH EXTRACTIONS     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 Gretel   STOMACH SURGERY  01/30/1982   [facility], [facility]   SUBMUCOSAL TATTOO INJECTION  07/04/2021   Procedure: SUBMUCOSAL TATTOO INJECTION;  Surgeon: Legrand Victory LITTIE DOUGLAS, MD;  Location: [facility];  Service: Gastroenterology;;    Family History family history includes Breast cancer in his maternal grandmother; Diabetes in his brother, brother, father, mother, and sister; Heart disease in his brother and son; Hypertension in his mother; Lung disease in his paternal grandfather; Stroke in his mother.  Social History Social History   Socioeconomic History   Marital status: Married    Spouse name: Not on file   Number of children: 3   Years of education: Not on file   Highest education level: GED or equivalent  Occupational History   Occupation: Ship broker, retired    Comment: Firefighter  Tobacco Use   Smoking status: Former    Current packs/day: 0.00    Average packs/day: 1 pack/day for 41.0 years (41.0 ttl pk-yrs)    Types: Cigarettes    Start date: 67    Quit date: 2014    Years since quitting: 11.6   Smokeless tobacco: Never  Vaping Use   Vaping status: Never Used  Substance and Sexual Activity   Alcohol use: No    Alcohol/week: 0.0 standard drinks of alcohol   Drug use: Never   Sexual activity: Not Currently  Other Topics Concern   Not on file  Social History Narrative    Retired Photographer, [employer], aviation fuel, [phone], no known agent orange exposure but 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   Married 1975   3 kids   Social Drivers of Corporate investment banker Strain: Low Risk  (02/11/2023)   Overall Financial Resource Strain (CARDIA)    Difficulty of Paying Living Expenses: Not hard at all  Food Insecurity: Food Insecurity Present (02/11/2023)   Hunger Vital Sign    Worried About Running Out of Food in the Last Year: Sometimes true    Ran Out of Food in the Last Year: Sometimes true  Transportation Needs: No Transportation Needs (02/11/2023)   PRAPARE - Administrator, Civil Service (Medical): No    Lack of Transportation (Non-Medical): No  Physical Activity: Insufficiently Active (02/11/2023)   Exercise Vital Sign    Days of Exercise per Week: 1 day    Minutes of Exercise per Session: 10 min  Stress: No Stress Concern Present (02/11/2023)   [employer] of Occupational Health - Occupational Stress Questionnaire    Feeling of Stress : Not at all  Social Connections: Moderately Isolated (02/11/2023)   Social Connection and Isolation Panel    Frequency of Communication with Friends and Family: Twice a week    Frequency of Social Gatherings with Friends and Family: Twice a week    Attends Religious Services: Never    Database administrator or Organizations: No    Attends Banker Meetings: Never    Marital Status: Married  Catering manager Violence: Not At Risk (02/06/2023)   Humiliation, Afraid, Rape, and Kick  questionnaire    Fear of Current or Ex-Partner: No    Emotionally Abused: No    Physically Abused: No    Sexually Abused: No    Lab Results  Component Value Date   HGBA1C 6.7 (H) 09/28/2023   HGBA1C 6.1 (A) 06/28/2023   HGBA1C 6.3 (A) 03/01/2023   Lab Results  Component Value Date   CHOL 106 09/28/2023   Lab Results  Component Value Date   HDL 33 (L) 09/28/2023   Lab Results  Component Value  Date   LDLCALC 43 09/28/2023   Lab Results  Component Value Date   TRIG 256 (H) 09/28/2023   Lab Results  Component Value Date   CHOLHDL 3.2 09/28/2023   Lab Results  Component Value Date   CREATININE 1.12 09/28/2023   Lab Results  Component Value Date   GFR 59.36 (L) 10/09/2022   Lab Results  Component Value Date   MICROALBUR 3.1 09/28/2023      Component Value Date/Time   NA 140 09/28/2023 0750   NA 142 08/14/2019 1136   K 4.7 09/28/2023 0750   CL 102 09/28/2023 0750   CO2 30 09/28/2023 0750   GLUCOSE 106 (H) 09/28/2023 0750   BUN 19 09/28/2023 0750   BUN 31 (H) 08/14/2019 1136   CREATININE 1.12 09/28/2023 0750   CALCIUM  9.6 09/28/2023 0750   PROT 7.3 09/28/2023 0750   PROT 7.2 09/28/2020 1452   ALBUMIN 4.1 10/09/2022 0801   ALBUMIN 4.1 09/28/2020 1452   AST 20 09/28/2023 0750   ALT 24 09/28/2023 0750   ALKPHOS 64 10/09/2022 0801   BILITOT 0.5 09/28/2023 0750   BILITOT 0.3 09/28/2020 1452   GFRNONAA 63 08/14/2019 1136   GFRAA 73 08/14/2019 1136      Latest Ref Rng & Units 09/28/2023    7:50 AM 05/17/2023    2:50 PM 10/09/2022    8:01 AM  BMP  Glucose 65 - 99 mg/dL 893  866  78   BUN 7 - 25 mg/dL 19  14  20    Creatinine 0.70 - 1.28 mg/dL 8.87  9.01  8.76   BUN/Creat Ratio 6 - 22 (calc) SEE NOTE:  SEE NOTE:    Sodium 135 - 146 mmol/L 140  140  141   Potassium 3.5 - 5.3 mmol/L 4.7  4.6  4.9   Chloride 98 - 110 mmol/L 102  103  104   CO2 20 - 32 mmol/L 30  29  31    Calcium  8.6 - 10.3 mg/dL 9.6  9.8  9.9        Component Value Date/Time   WBC 7.3 02/12/2023 1221   RBC 5.14 02/12/2023 1221   HGB 16.6 02/12/2023 1221   HGB 13.6 08/14/2019 1136   HCT 50.4 02/12/2023 1221   HCT 39.4 08/14/2019 1136   PLT 196.0 02/12/2023 1221   PLT 148 (L) 08/14/2019 1136   MCV 98.0 02/12/2023 1221   MCV 98 (H) 08/14/2019 1136   MCH 33.8 (H) 08/14/2019 1136   MCH 21.8 (L) 04/27/2015 1016   MCHC 33.0 02/12/2023 1221   RDW 13.0 02/12/2023 1221   RDW 12.4 08/14/2019  1136   LYMPHSABS 1.3 02/12/2023 1221   MONOABS 0.9 02/12/2023 1221   EOSABS 0.2 02/12/2023 1221   BASOSABS 0.0 02/12/2023 1221     Parts of this note may have been dictated using voice recognition software. There may be variances in spelling and vocabulary which are unintentional. Not all errors are proofread. Please  notify the dino if any discrepancies are noted or if the meaning of any statement is not clear.

## 2023-10-03 ENCOUNTER — Encounter: Payer: Self-pay | Admitting: "Endocrinology

## 2023-10-03 ENCOUNTER — Ambulatory Visit: Admitting: Podiatry

## 2023-10-08 ENCOUNTER — Telehealth: Payer: Self-pay | Admitting: Cardiovascular Disease

## 2023-10-08 DIAGNOSIS — E785 Hyperlipidemia, unspecified: Secondary | ICD-10-CM

## 2023-10-08 MED ORDER — LISINOPRIL 40 MG PO TABS: 40.0000 mg | ORAL_TABLET | Freq: Every day | ORAL | 0 refills | Status: DC

## 2023-10-08 MED ORDER — CLOPIDOGREL BISULFATE 75 MG PO TABS: 75.0000 mg | ORAL_TABLET | Freq: Every day | ORAL | 0 refills | Status: DC

## 2023-10-08 MED ORDER — ROSUVASTATIN CALCIUM 20 MG PO TABS: 20.0000 mg | ORAL_TABLET | Freq: Every day | ORAL | 0 refills | Status: DC

## 2023-10-08 NOTE — Telephone Encounter (Signed)
*  STAT* If patient is at the pharmacy, call can be transferred to refill team.   1. Which medications need to be refilled? (please list name of each medication and dose if known)  lisinopril  (ZESTRIL ) 40 MG tablet   clopidogrel  (PLAVIX ) 75 MG tablet  rosuvastatin  (CRESTOR ) 20 MG tablet  2. Which pharmacy/location (including street and city if local pharmacy) is medication to be sent to? [id number] [place], [place] - [address] Phone: [phone]  Fax: [phone]     3. Do they need a 30 day or 90 day supply? 90

## 2023-10-08 NOTE — Telephone Encounter (Signed)
 Pt's medications were sent to pt's pharmacy as requested. Confirmation received.

## 2023-10-09 ENCOUNTER — Ambulatory Visit: Admitting: Podiatry

## 2023-10-11 ENCOUNTER — Encounter: Payer: Self-pay | Admitting: Podiatry

## 2023-10-11 ENCOUNTER — Ambulatory Visit (INDEPENDENT_AMBULATORY_CARE_PROVIDER_SITE_OTHER): Admitting: Podiatry

## 2023-10-11 VITALS — Ht 70.0 in | Wt 191.0 lb

## 2023-10-11 DIAGNOSIS — B351 Tinea unguium: Secondary | ICD-10-CM | POA: Diagnosis not present

## 2023-10-11 DIAGNOSIS — M79676 Pain in unspecified toe(s): Secondary | ICD-10-CM | POA: Diagnosis not present

## 2023-10-11 DIAGNOSIS — E119 Type 2 diabetes mellitus without complications: Secondary | ICD-10-CM | POA: Diagnosis not present

## 2023-10-11 NOTE — Progress Notes (Signed)
This patient returns to my office for at risk foot care.  This patient requires this care by a professional since this patient will be at risk due to having type 2 diabetes.  This patient is unable to cut nails himself since the patient cannot reach his nails.These nails are painful walking and wearing shoes.  This patient presents for at risk foot care today.  General Appearance  Alert, conversant and in no acute stress.  Vascular  Dorsalis pedis and posterior tibial  pulses are palpable  bilaterally.  Capillary return is within normal limits  bilaterally. Temperature is within normal limits  bilaterally.  Neurologic  Senn-Weinstein monofilament wire test diminished  bilaterally. Muscle power within normal limits bilaterally.  Nails Thick disfigured discolored nails with subungual debris  from hallux to fifth toes bilaterally. No evidence of bacterial infection or drainage bilaterally.  Orthopedic  No limitations of motion  feet .  No crepitus or effusions noted.  No bony pathology or digital deformities noted.  HAV  B/L.  Skin  normotropic skin with no porokeratosis noted bilaterally.  No signs of infections or ulcers noted.     Onychomycosis  Pain in right toes  Pain in left toes  Consent was obtained for treatment procedures.   Mechanical debridement of nails 1-5  bilaterally performed with a nail nipper.  Filed with dremel without incident.    Return office visit    10   weeks                  Told patient to return for periodic foot care and evaluation due to potential at risk complications.   Everette Mall DPM  

## 2023-10-16 ENCOUNTER — Ambulatory Visit: Attending: Cardiovascular Disease

## 2023-10-16 ENCOUNTER — Ambulatory Visit (INDEPENDENT_AMBULATORY_CARE_PROVIDER_SITE_OTHER)

## 2023-10-16 DIAGNOSIS — I739 Peripheral vascular disease, unspecified: Secondary | ICD-10-CM

## 2023-10-16 DIAGNOSIS — I10 Essential (primary) hypertension: Secondary | ICD-10-CM | POA: Diagnosis not present

## 2023-10-17 LAB — VAS US ABI WITH/WO TBI
Left ABI: 1.14
Right ABI: 1.09

## 2023-10-18 ENCOUNTER — Ambulatory Visit: Payer: Self-pay | Admitting: Cardiovascular Disease

## 2023-10-20 ENCOUNTER — Other Ambulatory Visit: Payer: Self-pay | Admitting: Family Medicine

## 2023-10-20 ENCOUNTER — Other Ambulatory Visit: Payer: Self-pay | Admitting: "Endocrinology

## 2023-10-20 DIAGNOSIS — M545 Low back pain, unspecified: Secondary | ICD-10-CM

## 2023-10-20 DIAGNOSIS — M542 Cervicalgia: Secondary | ICD-10-CM

## 2023-10-22 NOTE — Telephone Encounter (Signed)
 Tizanidine  Last filled:  07/22/23, #90 Last OV:  06/01/23, low back pain Next OV:  none

## 2023-10-22 NOTE — Telephone Encounter (Signed)
 Requested Prescriptions   Pending Prescriptions Disp Refills   SYNJARDY  XR 12.05-998 MG TB24 [Pharmacy Med Name: SYNJARDY  XR 12.5-1,000 MG TAB] 60 tablet 1    Sig: TAKE 2 TABLETS BY MOUTH EVERY DAY

## 2023-10-28 ENCOUNTER — Other Ambulatory Visit: Payer: Self-pay | Admitting: Cardiovascular Disease

## 2023-10-28 DIAGNOSIS — E785 Hyperlipidemia, unspecified: Secondary | ICD-10-CM

## 2023-11-14 ENCOUNTER — Other Ambulatory Visit: Payer: Self-pay | Admitting: Family Medicine

## 2023-11-14 DIAGNOSIS — M542 Cervicalgia: Secondary | ICD-10-CM

## 2023-11-14 DIAGNOSIS — M545 Low back pain, unspecified: Secondary | ICD-10-CM

## 2023-11-14 NOTE — Telephone Encounter (Signed)
 I sent the rx.  Thanks.

## 2023-11-14 NOTE — Telephone Encounter (Signed)
**Note De-identified  [place] Obfuscation** Please advise 

## 2023-11-27 ENCOUNTER — Other Ambulatory Visit: Payer: Self-pay

## 2023-11-27 DIAGNOSIS — E1165 Type 2 diabetes mellitus with hyperglycemia: Secondary | ICD-10-CM

## 2023-11-27 MED ORDER — FREESTYLE LIBRE 3 READER DEVI: 0 refills | Status: DC

## 2023-11-29 ENCOUNTER — Telehealth: Payer: Self-pay

## 2023-11-29 NOTE — Telephone Encounter (Signed)
 LVM to schedule annual Lung CT.

## 2023-12-06 NOTE — Progress Notes (Unsigned)
 Date:  12/07/2023   ID:  Anthony Cuevas, DOB Feb 08, 1950, MRN [id number]  Patient Location:  [address] [place] [address]   Provider location:   [facility], [place] office  PCP:  Cleatus Arlyss RAMAN, MD  Cardiologist:  Perla [facility]  Chief Complaint  Patient presents with   12 month follow up     Doing well.     History of Present Illness:    Anthony Cuevas is a 73 y.o. male PMH of former smoker, >20 years, quit >10 years ago Coronary artery disease on CT scan Hyperlipidemia Diabetes II PAD, stent to left LE Presents for f/u of his  coronary artery disease, shortness of breath, PAD  Last seen by myself in clinic 10/24  In follow-up today reports feeling well Took all of his medications on empty stomach this morning, Blood pressure low, sugars running low, glucose sensor was alerting him in the waiting room Received a breakfast bar, some fluids Denies orthostasis symptoms, pressures 90s systolic on recheck  Lower extremity arterial Dopplers September 2025 Stable ABIs Patent left iliac stent  Lab work reviewed A1c 6.7 Total cholesterol 106 LDL 43  Active at baseline hobbies include fishing, quilting [employer] at yard sales Regular exercise  No chest pain or shortness of breath concerning for angina Denies claudication symptoms  October 03, 2022 lower extremity arterial Dopplers Normal ABI and patent left iliac stent.   EKG personally reviewed by myself on todays visit Normal sinus rhythm rate 82 bpm no significant ST-T wave changes  Other past medical history reviewed Prior angiography which showed moderate right common iliac artery stenosis, occluded and heavily calcified left common iliac artery in a long segment.   -successful angioplasty and covered stent placement to the left common iliac artery.  Postprocedure vascular studies showed improvement in ABI to normal with patent iliac stent and moderately  elevated velocities just distal to the stent.  Prior imaging reviewed CT scan chest July 29, 2018 with at least moderate three-vessel coronary atherosclerosis, Moderate diffuse aortic atherosclerosis Emphysema   Past Medical History:  Diagnosis Date   Arthritis    Coronary atherosclerosis    a. 07/2018 CT Chest: 3 vessel coronary atherosclerosis; b. 07/2018 MV: EF 46% (GI uptake artifact), No ischemia/infarct. Mild to mod diffuse Ao atherosclerosis and at least moderate 2 vessel CAD (LAD/RCA) on CT imaging. Low risk.   Depression    improved on sertraline    Diabetes mellitus, type 2 (HCC) 09/1996   GERD (gastroesophageal reflux disease) 1990   History of peptic ulcer disease    Hyperlipidemia 1992   Hypertension 05/2003   Lumbar stenosis L2-3   Muscle atrophy    in arms from degenerative changes in neck   Neck pain    chronic   Smoker    Wears glasses    Past Surgical History:  Procedure Laterality Date   ABDOMINAL AORTOGRAM W/LOWER EXTREMITY N/A 09/03/2019   Procedure: ABDOMINAL AORTOGRAM W/LOWER EXTREMITY;  Surgeon: Darron Deatrice LABOR, MD;  Location: [facility];  Service: Cardiovascular;  Laterality: N/A;  Limited Study   CERVICAL DISC SURGERY  04/19/2002   fusion, Dr. Barbarann   CERVICAL DISCECTOMY  01/31/1995   partial   CERVICAL DISCECTOMY  01/30/1998   COLONOSCOPY WITH ESOPHAGOGASTRODUODENOSCOPY (EGD)  06/25/2015   ENTEROSCOPY N/A 07/04/2021   Procedure: ENTEROSCOPY;  Surgeon: Legrand Victory LITTIE DOUGLAS, MD;  Location: [facility];  Service: Gastroenterology;  Laterality: N/A;  HEMORRHOID SURGERY  01/31/1987   HOT HEMOSTASIS N/A 07/04/2021   Procedure: HOT HEMOSTASIS (ARGON PLASMA COAGULATION/BICAP);  Surgeon: Legrand Victory LITTIE DOUGLAS, MD;  Location: [facility];  Service: Gastroenterology;  Laterality: N/A;   KNEE ARTHROPLASTY  11/20/2011   Procedure: COMPUTER ASSISTED TOTAL KNEE ARTHROPLASTY;  Surgeon: Oneil JAYSON Herald, MD;  Location: [facility];  Service: Orthopedics;  Laterality: Left;   Conversion Left Knee Medial Uni to Total Knee Arthroplasty-Cemented   LUMBAR LAMINECTOMY/DECOMPRESSION MICRODISCECTOMY N/A 04/28/2015   Procedure: L2-3 Decompression;  Surgeon: Oneil JAYSON Herald, MD;  Location: [facility];  Service: Orthopedics;  Laterality: N/A;   MR MRA DUPLICATE EXAM  INACTIVATE     MULTIPLE TOOTH EXTRACTIONS     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 Gretel   STOMACH SURGERY  01/30/1982   [facility], [facility]   SUBMUCOSAL TATTOO INJECTION  07/04/2021   Procedure: SUBMUCOSAL TATTOO INJECTION;  Surgeon: Legrand Victory LITTIE DOUGLAS, MD;  Location: [facility];  Service: Gastroenterology;;     Current Meds  Medication Sig   ascorbic acid  (VITAMIN C) 500 MG tablet Take 1 tablet (500 mg total) by mouth daily.   Cholecalciferol  (VITAMIN D3) 125 MCG (5000 UT) CAPS Take 5,000 Units by mouth 2 (two) times a week.   clopidogrel  (PLAVIX ) 75 MG tablet Take 1 tablet (75 mg total) by mouth daily.   Continuous Glucose Receiver (FREESTYLE LIBRE 3 READER) DEVI Use to monitor glucose continuously   Continuous Glucose Sensor (FREESTYLE LIBRE 3 SENSOR) MISC Place 1 sensor on the skin every 14 days. Use to check glucose continuously   cyanocobalamin  1000 MCG tablet Take 1,000 mcg by mouth daily.   cyclobenzaprine  (FLEXERIL ) 10 MG tablet Take 10 mg by mouth 2 (two) times daily as needed.   Dulaglutide  (TRULICITY ) 4.5 MG/0.5ML SOAJ Inject 4.5 mg as directed once a week.   ezetimibe  (ZETIA ) 10 MG tablet Take 10 mg by mouth daily.   ferrous sulfate  325 (65 FE) MG tablet TAKE 1 TABLET ORALLY DAILY ON SUNDAY, WEDNESDAY (2 TABLETS PER WEEK)   Glucagon  (GVOKE HYPOPEN  1-PACK) 1 MG/0.2ML SOAJ Inject 1 mg into the skin as needed (low blood sugar  with imaopired consciousness).   glucose blood (ONETOUCH ULTRA) test strip USE AS INSTRUCTED TO TEST BLOOD SUGARS 3 OR 4 TIMES DAILY   insulin  aspart (NOVOLOG  FLEXPEN) 100 UNIT/ML FlexPen 25 units AM, 26 units lunch, 28 units at supper.   insulin  glargine, 2 Unit Dial, (TOUJEO  MAX SOLOSTAR) 300 UNIT/ML Solostar Pen Inject 66 Units into the skin daily.   Insulin  Pen Needle (SURE COMFORT PEN NEEDLES) 32G X 4 MM MISC USE 4 TIMES A DAY   latanoprost  (XALATAN ) 0.005 % ophthalmic solution Place 1 drop into both eyes at bedtime.   lisinopril  (ZESTRIL ) 40 MG tablet TAKE 1 TABLET BY MOUTH EVERY DAY   Omega-3 Fatty Acids  (FISH OIL PO) Take 1,000 mg by mouth once a week.   rosuvastatin  (CRESTOR ) 20 MG tablet TAKE 1 TABLET BY MOUTH EVERY DAY   SYNJARDY  XR 12.05-998 MG TB24 TAKE 2 TABLETS BY MOUTH EVERY DAY   tiZANidine  (ZANAFLEX ) 2 MG tablet TAKE 0.5-1 TABLETS (1-2 MG TOTAL) BY MOUTH EVERY 8 (EIGHT) HOURS AS NEEDED FOR MUSCLE SPASMS (USE  INSTEAD OF FLEXERIL . SEDATION CAUTION.).   traMADol  (ULTRAM ) 50 MG tablet TAKE 1 TABLET TWO TO THREE TIMES A DAY AS NEEDED FOR MODERATE PAIN    Allergies:   Patient has no known allergies.   Social History   Tobacco Use   Smoking status: Former    Current packs/day: 0.00    Average packs/day: 1 pack/day for 41.0 years (41.0 ttl pk-yrs)    Types: Cigarettes    Start date: 72    Quit date: 2014    Years since quitting: 11.8   Smokeless tobacco: Never  Vaping Use   Vaping status: Never Used  Substance Use Topics   Alcohol use: No    Alcohol/week: 0.0 standard drinks of alcohol   Drug use: Never     Family Hx: The patient's family history includes Breast cancer in his maternal grandmother; Diabetes in his brother, brother, father, mother, and sister; Heart disease in his brother and son; Hypertension in his mother; Lung disease in his paternal grandfather; Stroke in his mother. There is no history of Arthritis, Prostate cancer, Colon cancer, or Stomach  cancer.  ROS:   Please see the history of present illness.    Review of Systems  Constitutional: Negative.   HENT: Negative.    Respiratory:  Positive for shortness of breath.   Cardiovascular: Negative.   Gastrointestinal: Negative.   Musculoskeletal: Negative.   Neurological: Negative.   Psychiatric/Behavioral: Negative.    All other systems reviewed and are negative.   Labs/Other Tests and Data Reviewed:    Recent Labs: 02/12/2023: Hemoglobin 16.6; Platelets 196.0 09/28/2023: ALT 24; BUN 19; Creat 1.12; Potassium 4.7; Sodium 140   Recent Lipid Panel Lab Results  Component Value Date/Time   CHOL 106 09/28/2023 07:50 AM   CHOL 108 09/28/2020 02:52 PM   TRIG 256 (H) 09/28/2023 07:50 AM   HDL 33 (L) 09/28/2023 07:50 AM   HDL 30 (L) 09/28/2020 02:52 PM   CHOLHDL 3.2 09/28/2023 07:50 AM   LDLCALC 43 09/28/2023 07:50 AM   LDLDIRECT 78.0 12/05/2019 07:53 AM    Wt Readings from Last 3 Encounters:  12/07/23 191 lb 2 oz (86.7 kg)  10/11/23 191 lb (86.6 kg)  10/02/23 191 lb (86.6 kg)     Exam:    Vital Signs: Vital signs may also be detailed in the HPI BP (!) 86/50 (BP Location: Right Arm, Patient Position: Sitting, Cuff Size: Normal)   Pulse 82   Ht 5' 10 (1.778 m)   Wt 191 lb 2 oz (86.7 kg)   SpO2 95%   BMI 27.42 kg/m   Constitutional:  oriented to person, place, and time. No distress.  HENT:  Head: Grossly normal Eyes:  no discharge. No scleral icterus.  Neck: No JVD, no carotid bruits  Cardiovascular: Regular rate and rhythm, no murmurs appreciated Pulmonary/Chest: Clear to auscultation bilaterally, no wheezes or rales Abdominal: Soft.  no distension.  no tenderness.  Musculoskeletal: Normal range of motion Neurological:  normal muscle tone. Coordination normal. No atrophy Skin: Skin warm and dry Psychiatric: normal affect, pleasant   ASSESSMENT & PLAN:    --PAD (peripheral artery disease) (HCC) --Uncontrolled type 2 diabetes mellitus with hypoglycemia  without coma (HCC) -Atherosclerosis of native coronary artery of native heart without angina pectoris --Essential hypertension --Hyperlipidemia, unspecified hyperlipidemia type --S/P insertion of iliac artery stent --Claudication in peripheral vascular disease (HCC)  Essential hypertension Low blood pressure this morning, took lisinopril  40 on empty stomach Asymptomatic, recommend close monitoring of blood pressure at  home Suggested to try not to take lisinopril  until he has eaten  CAD with stable angina On plavix , Crestor , Zetia  Currently with no symptoms of angina. No further workup at this time. Continue current medication regimen.  Cholesterol at goal  PAD No claudication sx.  Prior stenting, recent Dopplers showing stable flow Followed by Dr. Darron  Hyperlipidemia Continue Zetia  with his Crestor  Cholesterol at goal  Diabetes with complictions A1c 6.7, managed by primary care Recommend the take his medications after breakfast Sugars dropped below 1 empty stomach this morning   Signed, Evalene Lunger, MD  [facility] [address], [place], [place] [address]

## 2023-12-07 ENCOUNTER — Encounter: Payer: Self-pay | Admitting: Cardiovascular Disease

## 2023-12-07 ENCOUNTER — Ambulatory Visit: Attending: Cardiovascular Disease | Admitting: Cardiovascular Disease

## 2023-12-07 VITALS — BP 86/50 | HR 82 | Ht 70.0 in | Wt 191.1 lb

## 2023-12-07 DIAGNOSIS — E785 Hyperlipidemia, unspecified: Secondary | ICD-10-CM | POA: Diagnosis not present

## 2023-12-07 DIAGNOSIS — I739 Peripheral vascular disease, unspecified: Secondary | ICD-10-CM

## 2023-12-07 DIAGNOSIS — E11649 Type 2 diabetes mellitus with hypoglycemia without coma: Secondary | ICD-10-CM

## 2023-12-07 DIAGNOSIS — I2510 Atherosclerotic heart disease of native coronary artery without angina pectoris: Secondary | ICD-10-CM | POA: Diagnosis not present

## 2023-12-07 DIAGNOSIS — I10 Essential (primary) hypertension: Secondary | ICD-10-CM

## 2023-12-07 MED ORDER — ROSUVASTATIN CALCIUM 20 MG PO TABS: 20.0000 mg | ORAL_TABLET | Freq: Every day | ORAL | 3 refills | Status: AC

## 2023-12-07 MED ORDER — LISINOPRIL 40 MG PO TABS: 40.0000 mg | ORAL_TABLET | Freq: Every day | ORAL | 3 refills | Status: AC

## 2023-12-07 NOTE — Patient Instructions (Signed)

## 2023-12-24 ENCOUNTER — Encounter: Payer: Self-pay | Admitting: "Endocrinology

## 2023-12-24 ENCOUNTER — Ambulatory Visit (INDEPENDENT_AMBULATORY_CARE_PROVIDER_SITE_OTHER): Admitting: Podiatry

## 2023-12-24 ENCOUNTER — Encounter: Payer: Self-pay | Admitting: Family Medicine

## 2023-12-24 ENCOUNTER — Ambulatory Visit (INDEPENDENT_AMBULATORY_CARE_PROVIDER_SITE_OTHER): Admitting: "Endocrinology

## 2023-12-24 VITALS — BP 100/80 | HR 92 | Ht 70.0 in | Wt 194.0 lb

## 2023-12-24 DIAGNOSIS — E1165 Type 2 diabetes mellitus with hyperglycemia: Secondary | ICD-10-CM

## 2023-12-24 DIAGNOSIS — E782 Mixed hyperlipidemia: Secondary | ICD-10-CM | POA: Diagnosis not present

## 2023-12-24 DIAGNOSIS — E119 Type 2 diabetes mellitus without complications: Secondary | ICD-10-CM

## 2023-12-24 DIAGNOSIS — M79676 Pain in unspecified toe(s): Secondary | ICD-10-CM

## 2023-12-24 DIAGNOSIS — Z794 Long term (current) use of insulin: Secondary | ICD-10-CM | POA: Diagnosis not present

## 2023-12-24 DIAGNOSIS — B351 Tinea unguium: Secondary | ICD-10-CM

## 2023-12-24 DIAGNOSIS — Z7985 Long-term (current) use of injectable non-insulin antidiabetic drugs: Secondary | ICD-10-CM

## 2023-12-24 DIAGNOSIS — Z7984 Long term (current) use of oral hypoglycemic drugs: Secondary | ICD-10-CM

## 2023-12-24 LAB — POCT GLYCOSYLATED HEMOGLOBIN (HGB A1C): Hemoglobin A1C: 6.3 % — AB (ref 4.0–5.6)

## 2023-12-24 MED ORDER — FREESTYLE LIBRE 3 READER DEVI: 0 refills | Status: DC

## 2023-12-24 NOTE — Progress Notes (Signed)
This patient returns to my office for at risk foot care.  This patient requires this care by a professional since this patient will be at risk due to having type 2 diabetes.  This patient is unable to cut nails himself since the patient cannot reach his nails.These nails are painful walking and wearing shoes.  This patient presents for at risk foot care today.  General Appearance  Alert, conversant and in no acute stress.  Vascular  Dorsalis pedis and posterior tibial  pulses are palpable  bilaterally.  Capillary return is within normal limits  bilaterally. Temperature is within normal limits  bilaterally.  Neurologic  Senn-Weinstein monofilament wire test diminished  bilaterally. Muscle power within normal limits bilaterally.  Nails Thick disfigured discolored nails with subungual debris  from hallux to fifth toes bilaterally. No evidence of bacterial infection or drainage bilaterally.  Orthopedic  No limitations of motion  feet .  No crepitus or effusions noted.  No bony pathology or digital deformities noted.  HAV  B/L.  Skin  normotropic skin with no porokeratosis noted bilaterally.  No signs of infections or ulcers noted.     Onychomycosis  Pain in right toes  Pain in left toes  Consent was obtained for treatment procedures.   Mechanical debridement of nails 1-5  bilaterally performed with a nail nipper.  Filed with dremel without incident.    Return office visit    10   weeks                  Told patient to return for periodic foot care and evaluation due to potential at risk complications.   Everette Mall DPM  

## 2023-12-24 NOTE — Progress Notes (Signed)
 Outpatient Endocrinology Note Anthony Birmingham, MD  12/24/23   Anthony Cuevas [birth year] [id number]  Referring Provider: Cleatus Arlyss RAMAN, MD Primary Care Provider: Cleatus Arlyss RAMAN, MD Reason for consultation: Subjective   Assessment & Plan  Diagnoses and all orders for this visit:  Uncontrolled type 2 diabetes mellitus with hyperglycemia, with long-term current use of insulin  (HCC) -     POCT glycosylated hemoglobin (Hb A1C)  Long term (current) use of oral hypoglycemic drugs  Long-term (current) use of injectable non-insulin  antidiabetic drugs  Mixed hypercholesterolemia and hypertriglyceridemia  Other orders -     Continuous Glucose Receiver (FREESTYLE LIBRE 3 READER) DEVI; Use to monitor glucose continuously   Diabetes Type 2 complicated by neuropathy   Lab Results  Component Value Date   GFR 59.36 (L) 10/09/2022   Hba1c goal less than 7, current Hba1c is  Lab Results  Component Value Date   HGBA1C 6.3 (A) 12/24/2023   Will recommend the following: Toujeo  56-58 units once at night  Novolog  insulin  26 units before break fast, 27 units before lunch and 29 units before dinner 15 min before meals Synjardy  12.05/998 twice a day     Continue Trulicity  4.5 mg weekly (mounjaro  not covered) S/p nutrition therapy [facility]s [facility], has a stent one leg and had vascular studies done per patient   No known contraindications/side effects to any of above medications Glucagon  ordered with refills on 07/18/22 No history of MEN syndrome/medullary thyroid  cancer/pancreatitis or pancreatic cancer in self or family  -Last LD and Tg are as follows: Lab Results  Component Value Date   LDLCALC 43 09/28/2023    Lab Results  Component Value Date   TRIG 256 (H) 09/28/2023   -Recommend rosuvastatin  20 mg every day, start fish oil 1000mg  2 pills bid  -Follow low fat diet and exercise   -Blood pressure goal <140/90 -  Microalbumin/creatinine goal < 30 -Last MA/Cr is as follows: Lab Results  Component Value Date   MICROALBUR 3.1 09/28/2023   - on ACE/ARB lisinopril  40 mg qd -diet changes including salt restriction -limit eating outside -counseled BP targets per standards of diabetes care -uncontrolled blood pressure can lead to retinopathy, nephropathy and cardiovascular and atherosclerotic heart disease  Reviewed and counseled on: -A1C target -Blood sugar targets -Complications of uncontrolled diabetes  -Checking blood sugar before meals and bedtime and bring log next visit -All medications with mechanism of action and side effects -Hypoglycemia management: rule of 15's, Glucagon  Emergency Kit and medical alert ID -low-carb low-fat plate-method diet -At least 20 minutes of physical activity per day -Annual dilated retinal eye exam and foot exam -compliance and follow up needs -follow up as scheduled or earlier if problem gets worse  Call if blood sugar is less than 70 or consistently above 250    Take a 15 gm snack of carbohydrate at bedtime before you go to sleep if your blood sugar is less than 100.    If you are going to fast after midnight for a test or procedure, ask your physician for instructions on how to reduce/decrease your insulin  dose.    Call if blood sugar is less than 70 or consistently above 250  -Treating a low sugar by rule of 15  (15 gms of sugar every 15 min until sugar is more than 70) If you feel your sugar is low, test your sugar to be sure If your sugar is low (less than 70), then  take 15 grams of a fast acting Carbohydrate (3-4 glucose tablets or glucose gel or 4 ounces of juice or regular soda) Recheck your sugar 15 min after treating low to make sure it is more than 70 If sugar is still less than 70, treat again with 15 grams of carbohydrate          Don't drive the hour of hypoglycemia  If unconscious/unable to eat or drink by mouth, use glucagon  injection or  nasal spray baqsimi and call 911. Can repeat again in 15 min if still unconscious.  Return in about 4 weeks (around 01/21/2024).   I have reviewed current medications, nurse's notes, allergies, vital signs, past medical and surgical history, family medical history, and social history for this encounter. Counseled patient on symptoms, examination findings, lab findings, imaging results, treatment decisions and monitoring and prognosis. The patient understood the recommendations and agrees with the treatment plan. All questions regarding treatment plan were fully answered.  Anthony Birmingham, MD  12/24/23   History of Present Illness Anthony Cuevas is a 73 y.o. year old male who presents for follow up of Type 2 diabetes mellitus.  Anthony Cuevas was first diagnosed in 87.   Diabetes education +  Home diabetes regimen: Toujeo  59 units once at night Novolog  insulin  26 units before break fast, 28 units before lunch and 29 units before dinner 15 min before meals Synjardy  12.05/998 twice a day     Trulicity  4.5 mg weekly   Previous history: Non-insulin  hypoglycemic drugs previously used: Actos , metformin  stopped in 5/23, glipizide  Insulin  was started in 2006 and has taken Lantus , NovoLog  as well as NPH and regular  COMPLICATIONS -  MI/Stroke -  retinopathy +  neuropathy -  nephropathy  BLOOD SUGAR DATA CGM interpretation: At today's visit, we reviewed her CGM downloads. The full report is scanned in the media. Reviewing the CGM trends, BG are running on the low side overnight and sometimes in afternoon with random highs in afternoon/evening.  Physical Exam  BP 100/80   Pulse 92   Ht 5' 10 (1.778 m)   Wt 194 lb (88 kg)   SpO2 95%   BMI 27.84 kg/m    Constitutional: well developed, well nourished Head: normocephalic, atraumatic Eyes: sclera anicteric, no redness Neck: supple Lungs: normal respiratory effort Neurology: alert and oriented Skin: dry, no appreciable  rashes Musculoskeletal: no appreciable defects Psychiatric: normal mood and affect Diabetic Foot Exam - Simple   Simple Foot Form Diabetic Foot exam was performed with the following findings: Yes 12/24/2023 10:30 AM  Visual Inspection No deformities, no ulcerations, no other skin breakdown bilaterally: Yes Sensation Testing Pulse Check See comments: Yes Comments + dorsalis pedis B/L +B/L foot calluses, + B/L thick toe nails, sees podiatrist       Current Medications Patient's Medications  New Prescriptions   CONTINUOUS GLUCOSE RECEIVER (FREESTYLE LIBRE 3 READER) DEVI    Use to monitor glucose continuously  Previous Medications   ASCORBIC ACID  (VITAMIN C) 500 MG TABLET    Take 1 tablet (500 mg total) by mouth daily.   CHOLECALCIFEROL  (VITAMIN D3) 125 MCG (5000 UT) CAPS    Take 5,000 Units by mouth 2 (two) times a week.   CLOPIDOGREL  (PLAVIX ) 75 MG TABLET    Take 1 tablet (75 mg total) by mouth daily.   CONTINUOUS GLUCOSE RECEIVER (FREESTYLE LIBRE 3 READER) DEVI    Use to monitor glucose continuously   CONTINUOUS GLUCOSE SENSOR (FREESTYLE LIBRE 3 SENSOR) MISC  Place 1 sensor on the skin every 14 days. Use to check glucose continuously   CYANOCOBALAMIN  1000 MCG TABLET    Take 1,000 mcg by mouth daily.   CYCLOBENZAPRINE  (FLEXERIL ) 10 MG TABLET    Take 10 mg by mouth 2 (two) times daily as needed.   DULAGLUTIDE  (TRULICITY ) 4.5 MG/0.5ML SOAJ    Inject 4.5 mg as directed once a week.   EZETIMIBE  (ZETIA ) 10 MG TABLET    Take 10 mg by mouth daily.   FERROUS SULFATE  325 (65 FE) MG TABLET    TAKE 1 TABLET ORALLY DAILY ON SUNDAY, WEDNESDAY (2 TABLETS PER WEEK)   GLUCAGON  (GVOKE HYPOPEN  1-PACK) 1 MG/0.2ML SOAJ    Inject 1 mg into the skin as needed (low blood sugar with imaopired consciousness).   GLUCOSE BLOOD (ONETOUCH ULTRA) TEST STRIP    USE AS INSTRUCTED TO TEST BLOOD SUGARS 3 OR 4 TIMES DAILY   INSULIN  ASPART (NOVOLOG  FLEXPEN) 100 UNIT/ML FLEXPEN    25 units AM, 26 units lunch, 28 units  at supper.   INSULIN  GLARGINE, 2 UNIT DIAL, (TOUJEO  MAX SOLOSTAR) 300 UNIT/ML SOLOSTAR PEN    Inject 66 Units into the skin daily.   INSULIN  PEN NEEDLE (SURE COMFORT PEN NEEDLES) 32G X 4 MM MISC    USE 4 TIMES A DAY   LATANOPROST  (XALATAN ) 0.005 % OPHTHALMIC SOLUTION    Place 1 drop into both eyes at bedtime.   LISINOPRIL  (ZESTRIL ) 40 MG TABLET    Take 1 tablet (40 mg total) by mouth daily.   OMEGA-3 FATTY ACIDS  (FISH OIL PO)    Take 1,000 mg by mouth once a week.   ROSUVASTATIN  (CRESTOR ) 20 MG TABLET    Take 1 tablet (20 mg total) by mouth daily.   SYNJARDY  XR 12.05-998 MG TB24    TAKE 2 TABLETS BY MOUTH EVERY DAY   TIZANIDINE  (ZANAFLEX ) 2 MG TABLET    TAKE 0.5-1 TABLETS (1-2 MG TOTAL) BY MOUTH EVERY 8 (EIGHT) HOURS AS NEEDED FOR MUSCLE SPASMS (USE INSTEAD OF FLEXERIL . SEDATION CAUTION.).   TRAMADOL  (ULTRAM ) 50 MG TABLET    TAKE 1 TABLET TWO TO THREE TIMES A DAY AS NEEDED FOR MODERATE PAIN  Modified Medications   No medications on file  Discontinued Medications   No medications on file    Allergies No Known Allergies  Past Medical History Past Medical History:  Diagnosis Date   Arthritis    Coronary atherosclerosis    a. 07/2018 CT Chest: 3 vessel coronary atherosclerosis; b. 07/2018 MV: EF 46% (GI uptake artifact), No ischemia/infarct. Mild to mod diffuse Ao atherosclerosis and at least moderate 2 vessel CAD (LAD/RCA) on CT imaging. Low risk.   Depression    improved on sertraline    Diabetes mellitus, type 2 (HCC) 09/1996   GERD (gastroesophageal reflux disease) 1990   History of peptic ulcer disease    Hyperlipidemia 1992   Hypertension 05/2003   Lumbar stenosis L2-3   Muscle atrophy    in arms from degenerative changes in neck   Neck pain    chronic   Smoker    Wears glasses     Past Surgical History Past Surgical History:  Procedure Laterality Date   ABDOMINAL AORTOGRAM W/LOWER EXTREMITY N/A 09/03/2019   Procedure: ABDOMINAL AORTOGRAM W/LOWER EXTREMITY;  Surgeon: Darron Deatrice LABOR, MD;  Location: [facility];  Service: Cardiovascular;  Laterality: N/A;  Limited Study   CERVICAL DISC SURGERY  04/19/2002   fusion, Dr. Barbarann   CERVICAL  DISCECTOMY  01/31/1995   partial   CERVICAL DISCECTOMY  01/30/1998   COLONOSCOPY WITH ESOPHAGOGASTRODUODENOSCOPY (EGD)  06/25/2015   ENTEROSCOPY N/A 07/04/2021   Procedure: ENTEROSCOPY;  Surgeon: Legrand Victory LITTIE DOUGLAS, MD;  Location: [facility];  Service: Gastroenterology;  Laterality: N/A;   HEMORRHOID SURGERY  01/31/1987   HOT HEMOSTASIS N/A 07/04/2021   Procedure: HOT HEMOSTASIS (ARGON PLASMA COAGULATION/BICAP);  Surgeon: Legrand Victory LITTIE DOUGLAS, MD;  Location: [facility];  Service: Gastroenterology;  Laterality: N/A;   KNEE ARTHROPLASTY  11/20/2011   Procedure: COMPUTER ASSISTED TOTAL KNEE ARTHROPLASTY;  Surgeon: Oneil JAYSON Herald, MD;  Location: [facility];  Service: Orthopedics;  Laterality: Left;  Conversion Left Knee Medial Uni to Total Knee Arthroplasty-Cemented   LUMBAR LAMINECTOMY/DECOMPRESSION MICRODISCECTOMY N/A 04/28/2015   Procedure: L2-3 Decompression;  Surgeon: Oneil JAYSON Herald, MD;  Location: [facility] OR;  Service: Orthopedics;  Laterality: N/A;   MR MRA DUPLICATE EXAM  INACTIVATE     MULTIPLE TOOTH EXTRACTIONS     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 Gretel   STOMACH SURGERY  01/30/1982   PUD, [facility]   SUBMUCOSAL TATTOO INJECTION  07/04/2021   Procedure: SUBMUCOSAL TATTOO INJECTION;  Surgeon: Legrand Victory LITTIE DOUGLAS, MD;  Location: [facility];  Service: Gastroenterology;;    Family History family history includes Breast cancer in his maternal grandmother; Diabetes in his brother, brother, father, mother, and sister; Heart disease in his brother and  son; Hypertension in his mother; Lung disease in his paternal grandfather; Stroke in his mother.  Social History Social History   Socioeconomic History   Marital status: Married    Spouse name: Not on file   Number of children: 3   Years of education: Not on file   Highest education level: GED or equivalent  Occupational History   Occupation: ship broker, retired    Comment: firefighter  Tobacco Use   Smoking status: Former    Current packs/day: 0.00    Average packs/day: 1 pack/day for 41.0 years (41.0 ttl pk-yrs)    Types: Cigarettes    Start date: 43    Quit date: 2014    Years since quitting: 11.9   Smokeless tobacco: Never  Vaping Use   Vaping status: Never Used  Substance and Sexual Activity   Alcohol use: No    Alcohol/week: 0.0 standard drinks of alcohol   Drug use: Never   Sexual activity: Not Currently  Other Topics Concern   Not on file  Social History Narrative   Retired Photographer, [employer], aviation fuel, [phone], no known agent orange exposure but 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   Married 1975   3 kids   Social Drivers of Corporate Investment Banker Strain: Low Risk  (02/11/2023)   Overall Financial Resource Strain (CARDIA)    Difficulty of Paying Living Expenses: Not hard at all  Food Insecurity: Food Insecurity Present (02/11/2023)   Hunger Vital Sign    Worried About Running Out of Food in the Last Year: Sometimes true    Ran Out of Food in the Last Year: Sometimes true  Transportation Needs: No Transportation Needs (02/11/2023)   PRAPARE - Transportation    Lack of Transportation (  Medical): No    Lack of Transportation (Non-Medical): No  Physical Activity: Insufficiently Active (02/11/2023)   Exercise Vital Sign    Days of Exercise per Week: 1 day    Minutes of Exercise per Session: 10 min  Stress: No Stress Concern Present (02/11/2023)   [employer] of Occupational Health - Occupational Stress Questionnaire    Feeling of  Stress : Not at all  Social Connections: Moderately Isolated (02/11/2023)   Social Connection and Isolation Panel    Frequency of Communication with Friends and Family: Twice a week    Frequency of Social Gatherings with Friends and Family: Twice a week    Attends Religious Services: Never    Database Administrator or Organizations: No    Attends Banker Meetings: Never    Marital Status: Married  Catering Manager Violence: Not At Risk (02/06/2023)   Humiliation, Afraid, Rape, and Kick questionnaire    Fear of Current or Ex-Partner: No    Emotionally Abused: No    Physically Abused: No    Sexually Abused: No    Lab Results  Component Value Date   HGBA1C 6.3 (A) 12/24/2023   HGBA1C 6.7 (H) 09/28/2023   HGBA1C 6.1 (A) 06/28/2023   Lab Results  Component Value Date   CHOL 106 09/28/2023   Lab Results  Component Value Date   HDL 33 (L) 09/28/2023   Lab Results  Component Value Date   LDLCALC 43 09/28/2023   Lab Results  Component Value Date   TRIG 256 (H) 09/28/2023   Lab Results  Component Value Date   CHOLHDL 3.2 09/28/2023   Lab Results  Component Value Date   CREATININE 1.12 09/28/2023   Lab Results  Component Value Date   GFR 59.36 (L) 10/09/2022   Lab Results  Component Value Date   MICROALBUR 3.1 09/28/2023      Component Value Date/Time   NA 140 09/28/2023 0750   NA 142 08/14/2019 1136   K 4.7 09/28/2023 0750   CL 102 09/28/2023 0750   CO2 30 09/28/2023 0750   GLUCOSE 106 (H) 09/28/2023 0750   BUN 19 09/28/2023 0750   BUN 31 (H) 08/14/2019 1136   CREATININE 1.12 09/28/2023 0750   CALCIUM  9.6 09/28/2023 0750   PROT 7.3 09/28/2023 0750   PROT 7.2 09/28/2020 1452   ALBUMIN 4.1 10/09/2022 0801   ALBUMIN 4.1 09/28/2020 1452   AST 20 09/28/2023 0750   ALT 24 09/28/2023 0750   ALKPHOS 64 10/09/2022 0801   BILITOT 0.5 09/28/2023 0750   BILITOT 0.3 09/28/2020 1452   GFRNONAA 63 08/14/2019 1136   GFRAA 73 08/14/2019 1136      Latest  Ref Rng & Units 09/28/2023    7:50 AM 05/17/2023    2:50 PM 10/09/2022    8:01 AM  BMP  Glucose 65 - 99 mg/dL 893  866  78   BUN 7 - 25 mg/dL 19  14  20    Creatinine 0.70 - 1.28 mg/dL 8.87  9.01  8.76   BUN/Creat Ratio 6 - 22 (calc) SEE NOTE:  SEE NOTE:    Sodium 135 - 146 mmol/L 140  140  141   Potassium 3.5 - 5.3 mmol/L 4.7  4.6  4.9   Chloride 98 - 110 mmol/L 102  103  104   CO2 20 - 32 mmol/L 30  29  31    Calcium  8.6 - 10.3 mg/dL 9.6  9.8  9.9  Component Value Date/Time   WBC 7.3 02/12/2023 1221   RBC 5.14 02/12/2023 1221   HGB 16.6 02/12/2023 1221   HGB 13.6 08/14/2019 1136   HCT 50.4 02/12/2023 1221   HCT 39.4 08/14/2019 1136   PLT 196.0 02/12/2023 1221   PLT 148 (L) 08/14/2019 1136   MCV 98.0 02/12/2023 1221   MCV 98 (H) 08/14/2019 1136   MCH 33.8 (H) 08/14/2019 1136   MCH 21.8 (L) 04/27/2015 1016   MCHC 33.0 02/12/2023 1221   RDW 13.0 02/12/2023 1221   RDW 12.4 08/14/2019 1136   LYMPHSABS 1.3 02/12/2023 1221   MONOABS 0.9 02/12/2023 1221   EOSABS 0.2 02/12/2023 1221   BASOSABS 0.0 02/12/2023 1221     Parts of this note may have been dictated using voice recognition software. There may be variances in spelling and vocabulary which are unintentional. Not all errors are proofread. Please notify the dino if any discrepancies are noted or if the meaning of any statement is not clear.

## 2023-12-25 ENCOUNTER — Encounter: Payer: Self-pay | Admitting: "Endocrinology

## 2023-12-31 ENCOUNTER — Other Ambulatory Visit: Payer: Self-pay

## 2023-12-31 DIAGNOSIS — E1165 Type 2 diabetes mellitus with hyperglycemia: Secondary | ICD-10-CM

## 2023-12-31 MED ORDER — FREESTYLE LIBRE 3 READER DEVI: 0 refills | Status: DC

## 2023-12-31 MED ORDER — FREESTYLE LITE TEST VI STRP: ORAL_STRIP | 5 refills | Status: AC

## 2024-01-01 ENCOUNTER — Ambulatory Visit: Admitting: "Endocrinology

## 2024-01-14 ENCOUNTER — Telehealth: Payer: Self-pay

## 2024-01-14 ENCOUNTER — Other Ambulatory Visit (HOSPITAL_COMMUNITY): Payer: Self-pay

## 2024-01-14 ENCOUNTER — Other Ambulatory Visit: Payer: Self-pay

## 2024-01-14 DIAGNOSIS — E1165 Type 2 diabetes mellitus with hyperglycemia: Secondary | ICD-10-CM

## 2024-01-14 MED ORDER — FREESTYLE LIBRE 3 READER DEVI: 0 refills | Status: DC

## 2024-01-14 NOTE — Telephone Encounter (Signed)
 Pt needs PA for [facility] reader.

## 2024-01-14 NOTE — Telephone Encounter (Signed)
 Pharmacy Patient Advocate Encounter   Received notification from Pt Calls Messages that prior authorization for Freestyle libre 3 reader is required/requested.   Insurance verification completed.   The patient is insured through [place] and [employer].   Per test claim: The current 90 day co-pay is, $0.  No PA needed at this time. This test claim was processed through [facility]- copay amounts may vary at other pharmacies due to pharmacy/plan contracts, or as the patient moves through the different stages of their insurance plan.

## 2024-01-15 ENCOUNTER — Other Ambulatory Visit: Payer: Self-pay

## 2024-01-15 DIAGNOSIS — E1165 Type 2 diabetes mellitus with hyperglycemia: Secondary | ICD-10-CM

## 2024-01-15 MED ORDER — FREESTYLE LIBRE 3 READER DEVI
0 refills | Status: AC
  Filled 2024-01-15: qty 1, 30d supply, fill #0

## 2024-01-15 NOTE — Telephone Encounter (Signed)
 Pt.notified

## 2024-01-22 ENCOUNTER — Ambulatory Visit (INDEPENDENT_AMBULATORY_CARE_PROVIDER_SITE_OTHER): Admitting: "Endocrinology

## 2024-01-22 ENCOUNTER — Encounter: Payer: Self-pay | Admitting: "Endocrinology

## 2024-01-22 VITALS — BP 130/80 | HR 82 | Ht 70.0 in | Wt 193.0 lb

## 2024-01-22 DIAGNOSIS — E1165 Type 2 diabetes mellitus with hyperglycemia: Secondary | ICD-10-CM

## 2024-01-22 DIAGNOSIS — E782 Mixed hyperlipidemia: Secondary | ICD-10-CM | POA: Diagnosis not present

## 2024-01-22 DIAGNOSIS — E1140 Type 2 diabetes mellitus with diabetic neuropathy, unspecified: Secondary | ICD-10-CM

## 2024-01-22 DIAGNOSIS — Z7985 Long-term (current) use of injectable non-insulin antidiabetic drugs: Secondary | ICD-10-CM

## 2024-01-22 DIAGNOSIS — Z794 Long term (current) use of insulin: Secondary | ICD-10-CM | POA: Diagnosis not present

## 2024-01-22 DIAGNOSIS — Z7984 Long term (current) use of oral hypoglycemic drugs: Secondary | ICD-10-CM

## 2024-01-22 NOTE — Progress Notes (Signed)
 "   Outpatient Endocrinology Note Anthony Birmingham, MD  01/22/2024   Anthony Cuevas 72-May-1952 [id number]  Referring Provider: Cleatus Arlyss RAMAN, MD Primary Care Provider: Cleatus Arlyss RAMAN, MD Reason for consultation: Subjective   Assessment & Plan  Diagnoses and all orders for this visit:  Uncontrolled type 2 diabetes mellitus with hyperglycemia, with long-term current use of insulin  (HCC)  Long term (current) use of oral hypoglycemic drugs  Long-term insulin  use (HCC)  Long-term (current) use of injectable non-insulin  antidiabetic drugs  Insulin  dose changed (HCC)  Mixed hypercholesterolemia and hypertriglyceridemia    Diabetes Type 2 complicated by neuropathy   Lab Results  Component Value Date   GFR 59.36 (L) 10/09/2022   Hba1c goal less than 7, current Hba1c is  Lab Results  Component Value Date   HGBA1C 6.3 (A) 12/24/2023   Will recommend the following: Toujeo  56 units once at night  Novolog  insulin  26 units before break fast, 27 units before lunch and 30 units before dinner 15 min before meals Synjardy  12.05/998 twice a day     Continue Trulicity  4.5 mg weekly (mounjaro  not covered) S/p nutrition therapy Follows [facility]ry [facility], has a stent one leg and had vascular studies done per patient   No known contraindications/side effects to any of above medications Glucagon  ordered with refills on 07/18/22 No history of MEN syndrome/medullary thyroid  cancer/pancreatitis or pancreatic cancer in self or family  -Last LD and Tg are as follows: Lab Results  Component Value Date   LDLCALC 43 09/28/2023    Lab Results  Component Value Date   TRIG 256 (H) 09/28/2023   -Recommend rosuvastatin  20 mg every day, start fish oil 1000mg  2 pills bid (twice a week) -Follow low fat diet and exercise   -Blood pressure goal <140/90 - Microalbumin/creatinine goal < 30 -Last MA/Cr is as follows: Lab Results  Component Value Date    MICROALBUR 3.1 09/28/2023   - on ACE/ARB lisinopril  40 mg qd -diet changes including salt restriction -limit eating outside -counseled BP targets per standards of diabetes care -uncontrolled blood pressure can lead to retinopathy, nephropathy and cardiovascular and atherosclerotic heart disease  Reviewed and counseled on: -A1C target -Blood sugar targets -Complications of uncontrolled diabetes  -Checking blood sugar before meals and bedtime and bring log next visit -All medications with mechanism of action and side effects -Hypoglycemia management: rule of 15's, Glucagon  Emergency Kit and medical alert ID -low-carb low-fat plate-method diet -At least 20 minutes of physical activity per day -Annual dilated retinal eye exam and foot exam -compliance and follow up needs -follow up as scheduled or earlier if problem gets worse  Call if blood sugar is less than 70 or consistently above 250    Take a 15 gm snack of carbohydrate at bedtime before you go to sleep if your blood sugar is less than 100.    If you are going to fast after midnight for a test or procedure, ask your physician for instructions on how to reduce/decrease your insulin  dose.    Call if blood sugar is less than 70 or consistently above 250  -Treating a low sugar by rule of 15  (15 gms of sugar every 15 min until sugar is more than 70) If you feel your sugar is low, test your sugar to be sure If your sugar is low (less than 70), then take 15 grams of a fast acting Carbohydrate (3-4 glucose tablets or glucose gel or 4 ounces  of juice or regular soda) Recheck your sugar 15 min after treating low to make sure it is more than 70 If sugar is still less than 70, treat again with 15 grams of carbohydrate          Don't drive the hour of hypoglycemia  If unconscious/unable to eat or drink by mouth, use glucagon  injection or nasal spray baqsimi and call 911. Can repeat again in 15 min if still unconscious.  No follow-ups on  file.   I have reviewed current medications, nurse's notes, allergies, vital signs, past medical and surgical history, family medical history, and social history for this encounter. Counseled patient on symptoms, examination findings, lab findings, imaging results, treatment decisions and monitoring and prognosis. The patient understood the recommendations and agrees with the treatment plan. All questions regarding treatment plan were fully answered.  Anthony Birmingham, MD  01/22/2024   History of Present Illness Anthony Cuevas is a 73 y.o. year old male who presents for follow up of Type 2 diabetes mellitus.  Anthony Cuevas was first diagnosed in 16.   Diabetes education +  Home diabetes regimen: Toujeo  58 units once at night Novolog  insulin  26 units before break fast, 28 units before lunch and 29 units before dinner 15 min before meals Synjardy  12.05/998 twice a day     Trulicity  4.5 mg weekly   Previous history: Non-insulin  hypoglycemic drugs previously used: Actos , metformin  stopped in 5/23, glipizide  Insulin  was started in 2006 and has taken Lantus , NovoLog  as well as NPH and regular  COMPLICATIONS -  MI/Stroke -  retinopathy +  neuropathy -  nephropathy  BLOOD SUGAR DATA CGM interpretation: At today's visit, we reviewed her CGM downloads. The full report is scanned in the media. Reviewing the CGM trends, BG are occasionally low overnight/morning running, thereafter are frequently in target (75%) with random highs in the daytime.  Physical Exam  BP 130/80   Pulse 82   Ht 5' 10 (1.778 m)   Wt 193 lb (87.5 kg)   SpO2 97%   BMI 27.69 kg/m    Constitutional: well developed, well nourished Head: normocephalic, atraumatic Eyes: sclera anicteric, no redness Neck: supple Lungs: normal respiratory effort Neurology: alert and oriented Skin: dry, no appreciable rashes Musculoskeletal: no appreciable defects Psychiatric: normal mood and affect Diabetic Foot Exam -  Simple   No data filed      Current Medications Patient's Medications  New Prescriptions   No medications on file  Previous Medications   ASCORBIC ACID  (VITAMIN C) 500 MG TABLET    Take 1 tablet (500 mg total) by mouth daily.   CHOLECALCIFEROL  (VITAMIN D3) 125 MCG (5000 UT) CAPS    Take 5,000 Units by mouth 2 (two) times a week.   CLOPIDOGREL  (PLAVIX ) 75 MG TABLET    Take 1 tablet (75 mg total) by mouth daily.   CONTINUOUS GLUCOSE RECEIVER (FREESTYLE LIBRE 3 READER) DEVI    Use to monitor glucose continuously   CONTINUOUS GLUCOSE SENSOR (FREESTYLE LIBRE 3 SENSOR) MISC    Place 1 sensor on the skin every 14 days. Use to check glucose continuously   CYANOCOBALAMIN  1000 MCG TABLET    Take 1,000 mcg by mouth daily.   CYCLOBENZAPRINE  (FLEXERIL ) 10 MG TABLET    Take 10 mg by mouth 2 (two) times daily as needed.   DULAGLUTIDE  (TRULICITY ) 4.5 MG/0.5ML SOAJ    Inject 4.5 mg as directed once a week.   EZETIMIBE  (ZETIA ) 10 MG TABLET  Take 10 mg by mouth daily.   FERROUS SULFATE  325 (65 FE) MG TABLET    TAKE 1 TABLET ORALLY DAILY ON SUNDAY, WEDNESDAY (2 TABLETS PER WEEK)   GLUCAGON  (GVOKE HYPOPEN  1-PACK) 1 MG/0.2ML SOAJ    Inject 1 mg into the skin as needed (low blood sugar with imaopired consciousness).   GLUCOSE BLOOD (FREESTYLE LITE) TEST STRIP    Use to check blood sugar 4 times a day   INSULIN  ASPART (NOVOLOG  FLEXPEN) 100 UNIT/ML FLEXPEN    25 units AM, 26 units lunch, 28 units at supper.   INSULIN  GLARGINE, 2 UNIT DIAL, (TOUJEO  MAX SOLOSTAR) 300 UNIT/ML SOLOSTAR PEN    Inject 66 Units into the skin daily.   INSULIN  PEN NEEDLE (SURE COMFORT PEN NEEDLES) 32G X 4 MM MISC    USE 4 TIMES A DAY   LATANOPROST  (XALATAN ) 0.005 % OPHTHALMIC SOLUTION    Place 1 drop into both eyes at bedtime.   LISINOPRIL  (ZESTRIL ) 40 MG TABLET    Take 1 tablet (40 mg total) by mouth daily.   OMEGA-3 FATTY ACIDS  (FISH OIL PO)    Take 1,000 mg by mouth once a week.   ROSUVASTATIN  (CRESTOR ) 20 MG TABLET    Take 1 tablet  (20 mg total) by mouth daily.   SYNJARDY  XR 12.05-998 MG TB24    TAKE 2 TABLETS BY MOUTH EVERY DAY   TIZANIDINE  (ZANAFLEX ) 2 MG TABLET    TAKE 0.5-1 TABLETS (1-2 MG TOTAL) BY MOUTH EVERY 8 (EIGHT) HOURS AS NEEDED FOR MUSCLE SPASMS (USE INSTEAD OF FLEXERIL . SEDATION CAUTION.).   TRAMADOL  (ULTRAM ) 50 MG TABLET    TAKE 1 TABLET TWO TO THREE TIMES A DAY AS NEEDED FOR MODERATE PAIN  Modified Medications   No medications on file  Discontinued Medications   No medications on file    Allergies No Known Allergies  Past Medical History Past Medical History:  Diagnosis Date   Arthritis    Coronary atherosclerosis    a. 07/2018 CT Chest: 3 vessel coronary atherosclerosis; b. 07/2018 MV: EF 46% (GI uptake artifact), No ischemia/infarct. Mild to mod diffuse Ao atherosclerosis and at least moderate 2 vessel CAD (LAD/RCA) on CT imaging. Low risk.   Depression    improved on sertraline    Diabetes mellitus, type 2 (HCC) 09/1996   GERD (gastroesophageal reflux disease) 1990   History of peptic ulcer disease    Hyperlipidemia 1992   Hypertension 05/2003   Lumbar stenosis L2-3   Muscle atrophy    in arms from degenerative changes in neck   Neck pain    chronic   Smoker    Wears glasses     Past Surgical History Past Surgical History:  Procedure Laterality Date   ABDOMINAL AORTOGRAM W/LOWER EXTREMITY N/A 09/03/2019   Procedure: ABDOMINAL AORTOGRAM W/LOWER EXTREMITY;  Surgeon: Darron Deatrice LABOR, MD;  Location: [facility];  Service: Cardiovascular;  Laterality: N/A;  Limited Study   CERVICAL DISC SURGERY  04/19/2002   fusion, Dr. Barbarann   CERVICAL DISCECTOMY  01/31/1995   partial   CERVICAL DISCECTOMY  01/30/1998   COLONOSCOPY WITH ESOPHAGOGASTRODUODENOSCOPY (EGD)  06/25/2015   ENTEROSCOPY N/A 07/04/2021   Procedure: ENTEROSCOPY;  Surgeon: Legrand Victory LITTIE DOUGLAS, MD;  Location: [facility];  Service: Gastroenterology;  Laterality: N/A;   HEMORRHOID SURGERY  01/31/1987   HOT HEMOSTASIS N/A  07/04/2021   Procedure: HOT HEMOSTASIS (ARGON PLASMA COAGULATION/BICAP);  Surgeon: Legrand Victory LITTIE DOUGLAS, MD;  Location: [facility];  Service: Gastroenterology;  Laterality: N/A;   KNEE ARTHROPLASTY  11/20/2011   Procedure: COMPUTER ASSISTED TOTAL KNEE ARTHROPLASTY;  Surgeon: Oneil JAYSON Herald, MD;  Location: [facility];  Service: Orthopedics;  Laterality: Left;  Conversion Left Knee Medial Uni to Total Knee Arthroplasty-Cemented   LUMBAR LAMINECTOMY/DECOMPRESSION MICRODISCECTOMY N/A 04/28/2015   Procedure: L2-3 Decompression;  Surgeon: Oneil JAYSON Herald, MD;  Location: [facility];  Service: Orthopedics;  Laterality: N/A;   MR MRA DUPLICATE EXAM  INACTIVATE     MULTIPLE TOOTH EXTRACTIONS     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 Gretel   STOMACH SURGERY  01/30/1982   [facility], [facility]   SUBMUCOSAL TATTOO INJECTION  07/04/2021   Procedure: SUBMUCOSAL TATTOO INJECTION;  Surgeon: Legrand Victory LITTIE DOUGLAS, MD;  Location: [facility];  Service: Gastroenterology;;    Family History family history includes Breast cancer in his maternal grandmother; Diabetes in his brother, brother, father, mother, and sister; Heart disease in his brother and son; Hypertension in his mother; Lung disease in his paternal grandfather; Stroke in his mother.  Social History Social History   Socioeconomic History   Marital status: Married    Spouse name: Not on file   Number of children: 3   Years of education: Not on file   Highest education level: GED or equivalent  Occupational History   Occupation: ship broker, retired    Comment: firefighter  Tobacco Use   Smoking status: Former    Current packs/day: 0.00    Average packs/day: 1 pack/day for 41.0  years (41.0 ttl pk-yrs)    Types: Cigarettes    Start date: 16    Quit date: 2014    Years since quitting: 11.9   Smokeless tobacco: Never  Vaping Use   Vaping status: Never Used  Substance and Sexual Activity   Alcohol use: No    Alcohol/week: 0.0 standard drinks of alcohol   Drug use: Never   Sexual activity: Not Currently  Other Topics Concern   Not on file  Social History Narrative   Retired Photographer, [employer], 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[employer], [phone], no known agent orange exposure but 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   Married 1975   3 kids   Social Drivers of Health   Tobacco Use: Medium Risk (01/22/2024)   Patient History    Smoking Tobacco Use: Former    Smokeless Tobacco Use: Never    Passive Exposure: Not on Actuary Strain: Low Risk (02/11/2023)   Overall Financial Resource Strain (CARDIA)    Difficulty of Paying Living Expenses: Not hard at all  Food Insecurity: Food Insecurity Present (02/11/2023)   Hunger Vital Sign    Worried About Running Out of Food in the Last Year: Sometimes true    Ran Out of Food in the Last Year: Sometimes true  Transportation Needs: No Transportation Needs (02/11/2023)   PRAPARE - Administrator, Civil Service (Medical): No    Lack of Transportation (Non-Medical): No  Physical Activity: Insufficiently Active (02/11/2023)   Exercise Vital Sign    Days of Exercise per Week: 1 day    Minutes of Exercise per Session: 10 min  Stress: No Stress Concern Present (02/11/2023)  [employer] of Occupational Health - Occupational Stress Questionnaire    Feeling of Stress : Not at all  Social Connections: Moderately Isolated (02/11/2023)   Social Connection and Isolation Panel    Frequency of Communication with Friends and Family: Twice a week    Frequency of Social Gatherings with Friends and Family: Twice a week    Attends Religious Services: Never    Database Administrator or Organizations: No    Attends Tax Inspector Meetings: Never    Marital Status: Married  Catering Manager Violence: Not At Risk (02/06/2023)   Humiliation, Afraid, Rape, and Kick questionnaire    Fear of Current or Ex-Partner: No    Emotionally Abused: No    Physically Abused: No    Sexually Abused: No  Depression (PHQ2-9): Low Risk (06/01/2023)   Depression (PHQ2-9)    PHQ-2 Score: 0  Alcohol Screen: Low Risk (02/11/2023)   Alcohol Screen    Last Alcohol Screening Score (AUDIT): 0  Housing: Low Risk (02/11/2023)   Housing Stability Vital Sign    Unable to Pay for Housing in the Last Year: No    Number of Times Moved in the Last Year: 0    Homeless in the Last Year: No  Utilities: Not At Risk (02/06/2023)   AHC Utilities    Threatened with loss of utilities: No  Health Literacy: Adequate Health Literacy (02/06/2023)   B1300 Health Literacy    Frequency of need for help with medical instructions: Never    Lab Results  Component Value Date   HGBA1C 6.3 (A) 12/24/2023   HGBA1C 6.7 (H) 09/28/2023   HGBA1C 6.1 (A) 06/28/2023   Lab Results  Component Value Date   CHOL 106 09/28/2023   Lab Results  Component Value Date   HDL 33 (L) 09/28/2023   Lab Results  Component Value Date   LDLCALC 43 09/28/2023   Lab Results  Component Value Date   TRIG 256 (H) 09/28/2023   Lab Results  Component Value Date   CHOLHDL 3.2 09/28/2023   Lab Results  Component Value Date   CREATININE 1.12 09/28/2023   Lab Results  Component Value Date   GFR 59.36 (L) 10/09/2022   Lab Results  Component Value Date   MICROALBUR 3.1 09/28/2023      Component Value Date/Time   NA 140 09/28/2023 0750   NA 142 08/14/2019 1136   K 4.7 09/28/2023 0750   CL 102 09/28/2023 0750   CO2 30 09/28/2023 0750   GLUCOSE 106 (H) 09/28/2023 0750   BUN 19 09/28/2023 0750   BUN 31 (H) 08/14/2019 1136   CREATININE 1.12 09/28/2023 0750   CALCIUM  9.6 09/28/2023 0750   PROT 7.3 09/28/2023 0750   PROT 7.2 09/28/2020 1452   ALBUMIN 4.1  10/09/2022 0801   ALBUMIN 4.1 09/28/2020 1452   AST 20 09/28/2023 0750   ALT 24 09/28/2023 0750   ALKPHOS 64 10/09/2022 0801   BILITOT 0.5 09/28/2023 0750   BILITOT 0.3 09/28/2020 1452   GFRNONAA 63 08/14/2019 1136   GFRAA 73 08/14/2019 1136      Latest Ref Rng & Units 09/28/2023    7:50 AM 05/17/2023    2:50 PM 10/09/2022    8:01 AM  BMP  Glucose 65 - 99 mg/dL 893  866  78   BUN 7 - 25 mg/dL 19  14  20    Creatinine 0.70 - 1.28 mg/dL 8.87  9.01  8.76   BUN/Creat Ratio 6 -  22 (calc) SEE NOTE:  SEE NOTE:    Sodium 135 - 146 mmol/L 140  140  141   Potassium 3.5 - 5.3 mmol/L 4.7  4.6  4.9   Chloride 98 - 110 mmol/L 102  103  104   CO2 20 - 32 mmol/L 30  29  31    Calcium  8.6 - 10.3 mg/dL 9.6  9.8  9.9        Component Value Date/Time   WBC 7.3 02/12/2023 1221   RBC 5.14 02/12/2023 1221   HGB 16.6 02/12/2023 1221   HGB 13.6 08/14/2019 1136   HCT 50.4 02/12/2023 1221   HCT 39.4 08/14/2019 1136   PLT 196.0 02/12/2023 1221   PLT 148 (L) 08/14/2019 1136   MCV 98.0 02/12/2023 1221   MCV 98 (H) 08/14/2019 1136   MCH 33.8 (H) 08/14/2019 1136   MCH 21.8 (L) 04/27/2015 1016   MCHC 33.0 02/12/2023 1221   RDW 13.0 02/12/2023 1221   RDW 12.4 08/14/2019 1136   LYMPHSABS 1.3 02/12/2023 1221   MONOABS 0.9 02/12/2023 1221   EOSABS 0.2 02/12/2023 1221   BASOSABS 0.0 02/12/2023 1221     Parts of this note may have been dictated using voice recognition software. There may be variances in spelling and vocabulary which are unintentional. Not all errors are proofread. Please notify the dino if any discrepancies are noted or if the meaning of any statement is not clear.   "

## 2024-01-22 NOTE — Patient Instructions (Signed)
 Will recommend the following: Toujeo  56 units once at night  Novolog  insulin  26 units before break fast, 27 units before lunch and 30 units before dinner 15 min before meals Synjardy  12.05/998 twice a day     Continue Trulicity  4.5 mg weekly   __________________     Goals of DM therapy:  Morning Fasting blood sugar: 80-140  Blood sugar before meals: 80-140 Bed time blood sugar: 100-150  A1C <7%, limited only by hypoglycemia  1.Diabetes medications and their side effects discussed, including hypoglycemia    2. Check blood glucose:  a) Always check blood sugars before driving. Please see below (under hypoglycemia) on how to manage b) Check a minimum of 3 times/day or more as needed when having symptoms of hypoglycemia.   c) Try to check blood glucose before sleeping/in the middle of the night to ensure that it is remaining stable and not dropping less than 100 d) Check blood glucose more often if sick  3. Diet: a) 3 meals per day schedule b: Restrict carbs to 60-70 grams (4 servings) per meal c) Colorful vegetables - 3 servings a day, and low sugar fruit 2 servings/day Plate control method: 1/4 plate protein, 1/4 starch, 1/2 green, yellow, or red vegetables d) Avoid carbohydrate snacks unless hypoglycemic episode, or increased physical activity  4. Regular exercise as tolerated, preferably 3 or more hours a week  5. Hypoglycemia: a)  Do not drive or operate machinery without first testing blood glucose to assure it is over 90 mg%, or if dizzy, lightheaded, not feeling normal, etc, or  if foot or leg is numb or weak. b)  If blood glucose less than 70, take four 5gm Glucose tabs or 15-30 gm Glucose gel.  Repeat every 15 min as needed until blood sugar is >100 mg/dl. If hypoglycemia persists then call 911.   6. Sick day management: a) Check blood glucose more often b) Continue usual therapy if blood sugars are elevated.   7. Contact the doctor immediately if blood glucose is  frequently <60 mg/dl, or an episode of severe hypoglycemia occurs (where someone had to give you glucose/  glucagon  or if you passed out from a low blood glucose), or if blood glucose is persistently >350 mg/dl, for further management  8. A change in level of physical activity or exercise and a change in diet may also affect your blood sugar. Check blood sugars more often and call if needed.  Instructions: 1. Bring glucose meter, blood glucose records on every visit for review 2. Continue to follow up with primary care physician and other providers for medical care 3. Yearly eye  and foot exam 4. Please get blood work done prior to the next appointment

## 2024-01-23 ENCOUNTER — Ambulatory Visit: Admitting: Orthopaedic Surgery

## 2024-01-23 ENCOUNTER — Ambulatory Visit: Payer: Self-pay

## 2024-01-23 ENCOUNTER — Ambulatory Visit
Admission: RE | Admit: 2024-01-23 | Discharge: 2024-01-23 | Disposition: A | Discharge status: Home / Self Care | Source: Ambulatory Visit | Attending: Acute Care | Admitting: Acute Care

## 2024-01-23 ENCOUNTER — Other Ambulatory Visit: Payer: Self-pay | Admitting: Cardiovascular Disease

## 2024-01-23 ENCOUNTER — Other Ambulatory Visit: Payer: Self-pay | Admitting: Family Medicine

## 2024-01-23 DIAGNOSIS — Z122 Encounter for screening for malignant neoplasm of respiratory organs: Secondary | ICD-10-CM | POA: Insufficient documentation

## 2024-01-23 DIAGNOSIS — M542 Cervicalgia: Secondary | ICD-10-CM | POA: Diagnosis not present

## 2024-01-23 DIAGNOSIS — M25562 Pain in left knee: Secondary | ICD-10-CM | POA: Diagnosis not present

## 2024-01-23 DIAGNOSIS — Z87891 Personal history of nicotine dependence: Secondary | ICD-10-CM | POA: Insufficient documentation

## 2024-01-23 DIAGNOSIS — G8929 Other chronic pain: Secondary | ICD-10-CM

## 2024-01-23 DIAGNOSIS — M545 Low back pain, unspecified: Secondary | ICD-10-CM

## 2024-01-23 NOTE — Progress Notes (Signed)
 "  Office Visit Note   Patient: Anthony Cuevas           Date of Birth: 1950/12/26           MRN: [id number] Visit Date: 01/23/2024              Requested by: Cleatus Arlyss RAMAN, MD [address] [place],  [place] [address] PCP: Cleatus Arlyss RAMAN, MD   Assessment & Plan: Visit Diagnoses:  1. Chronic pain of left knee   2. Neck pain     Plan: History of Present Illness Anthony Cuevas is a 73 year old male with prior cervical spine fusion who presents with left knee pain following a fall.  Two months ago he fell onto his left knee, causing superficial lacerations with embedded gravel that he removed himself. The skin wounds have healed. He has intermittent severe pinching pain at the medial knee with walking. The pain is absent at rest and is not constant. Initial swelling has markedly improved.  He has prior cervical fusion from C5 to C7 with plate removal and screws remaining. He has chronic restricted cervical range of motion, especially with left rotation, which limits his ability to look over his shoulder while driving and requires trunk rotation for compensation. He has no arm pain or radicular symptoms. He has occasional discomfort at the upper shoulder and neck muscles, managed with Tylenol  and Advil.  Physical Exam Left knee exam: healed surgical scar.  functional ROM.  collaterals stable.  slight tenderness to medial joint line. Cervical spine: nonfocal exam  Assessment and Plan Chronic left knee pain Intermittent medial knee pain likely due to scar tissue irritation post-fall. Surgical excision not indicated unless severe symptoms. - Reassured implants were not harmed from fall. - Advised conservative management and observation.  Chronic neck pain with prior cervical fusion Chronic neck pain and limited range of motion post C5-C7 fusion and hardware removal. Expected limitation due to fusion and cervical spondylosis. No radicular symptoms. - Recommended acetaminophen  and  ibuprofen for analgesia. - Confirmed current use of acetaminophen  and ibuprofen. - Reassured regarding expected loss of cervical range of motion due to prior fusion and degenerative changes.  Follow-Up Instructions: No follow-ups on file.   Orders:  Orders Placed This Encounter  Procedures   XR KNEE 3 VIEW LEFT   XR Cervical Spine 2 or 3 views   No orders of the defined types were placed in this encounter.     Procedures: No procedures performed   Clinical Data: No additional findings.   Subjective: Chief Complaint  Patient presents with   Left Knee - Pain    HPI  Review of Systems  Constitutional: Negative.   HENT: Negative.    Eyes: Negative.   Respiratory: Negative.    Cardiovascular: Negative.   Gastrointestinal: Negative.   Endocrine: Negative.   Genitourinary: Negative.   Skin: Negative.   Allergic/Immunologic: Negative.   Neurological: Negative.   Hematological: Negative.   Psychiatric/Behavioral: Negative.    All other systems reviewed and are negative.    Objective: Vital Signs: There were no vitals taken for this visit.  Physical Exam Vitals and nursing note reviewed.  Constitutional:      Appearance: He is well-developed.  HENT:     Head: Normocephalic and atraumatic.  Eyes:     Pupils: Pupils are equal, round, and reactive to light.  Pulmonary:     Effort: Pulmonary effort is normal.  Abdominal:     Palpations: Abdomen  is soft.  Musculoskeletal:        General: Normal range of motion.     Cervical back: Neck supple.  Skin:    General: Skin is warm.  Neurological:     Mental Status: He is alert and oriented to person, place, and time.  Psychiatric:        Behavior: Behavior normal.        Thought Content: Thought content normal.        Judgment: Judgment normal.     Ortho Exam  Specialty Comments:  No specialty comments available.  Imaging: No results found.   PMFS History: Patient Active Problem List   Diagnosis  Date Noted   Sebaceous cyst 11/15/2022   Shoulder pain 02/15/2022   Abnormal TSH 12/15/2019   Decreased pulses in feet 07/06/2019   Left hip pain 07/06/2019   Hand pain 07/10/2018   Lung nodule 07/10/2018   Lower back pain 07/10/2018   CAD (coronary artery disease) 01/06/2018   Joint pain 12/06/2017   Vertigo 12/06/2017   Trochanteric bursitis, left hip 03/21/2017   Healthcare maintenance 12/03/2016   Neck pain 12/03/2016   Depression 08/16/2015   Anemia, iron  deficiency 05/21/2015   Controlled type 2 diabetes mellitus without complication (HCC) 05/21/2015   History of lumbar laminectomy for spinal cord decompression 04/28/2015   Advance care planning 07/17/2014   Lumbar radiculopathy 04/17/2014   Medicare annual wellness visit, subsequent 12/07/2011   Osteoarthritis of left knee 11/20/2011    Class: End Stage   ED (erectile dysfunction) 06/01/2010   MUSCULAR WASTING AND DISUSE ATROPHY NEC 06/09/2009   Vitamin D  deficiency 01/28/2009   CARPAL TUNNEL SYNDROME, BILATERAL 07/13/2006   Diabetes mellitus type II, uncontrolled 06/25/2006   HLD (hyperlipidemia) 06/25/2006   Essential hypertension 06/25/2006   Cervical disc disorder 06/22/2006   TENDINITIS, CALCIFIC, SHOULDER, LEFT 06/22/2006   ELEVATED PROSTATE SPECIFIC ANTIGEN 06/22/2006   Past Medical History:  Diagnosis Date   Arthritis    Coronary atherosclerosis    a. 07/2018 CT Chest: 3 vessel coronary atherosclerosis; b. 07/2018 MV: EF 46% (GI uptake artifact), No ischemia/infarct. Mild to mod diffuse Ao atherosclerosis and at least moderate 2 vessel CAD (LAD/RCA) on CT imaging. Low risk.   Depression    improved on sertraline    Diabetes mellitus, type 2 (HCC) 09/1996   GERD (gastroesophageal reflux disease) 1990   History of peptic ulcer disease    Hyperlipidemia 1992   Hypertension 05/2003   Lumbar stenosis L2-3   Muscle atrophy    in arms from degenerative changes in neck   Neck pain    chronic   Smoker    Wears  glasses     Family History  Problem Relation Age of Onset   Diabetes Mother    Hypertension Mother    Stroke Mother    Diabetes Father    Diabetes Sister    Diabetes Brother    Diabetes Brother    Heart disease Brother    Breast cancer Maternal Grandmother    Lung disease Paternal Grandfather        black lung   Heart disease Son    Arthritis Neg Hx    Prostate cancer Neg Hx    Colon cancer Neg Hx    Stomach cancer Neg Hx     Past Surgical History:  Procedure Laterality Date   ABDOMINAL AORTOGRAM W/LOWER EXTREMITY N/A 09/03/2019   Procedure: ABDOMINAL AORTOGRAM W/LOWER EXTREMITY;  Surgeon: Darron Deatrice LABOR, MD;  Location: [facility];  Service: Cardiovascular;  Laterality: N/A;  Limited Study   CERVICAL DISC SURGERY  04/19/2002   fusion, Dr. Barbarann   CERVICAL DISCECTOMY  01/31/1995   partial   CERVICAL DISCECTOMY  01/30/1998   COLONOSCOPY WITH ESOPHAGOGASTRODUODENOSCOPY (EGD)  06/25/2015   ENTEROSCOPY N/A 07/04/2021   Procedure: ENTEROSCOPY;  Surgeon: Legrand Victory LITTIE DOUGLAS, MD;  Location: [facility];  Service: Gastroenterology;  Laterality: N/A;   HEMORRHOID SURGERY  01/31/1987   HOT HEMOSTASIS N/A 07/04/2021   Procedure: HOT HEMOSTASIS (ARGON PLASMA COAGULATION/BICAP);  Surgeon: Legrand Victory LITTIE DOUGLAS, MD;  Location: [facility];  Service: Gastroenterology;  Laterality: N/A;   KNEE ARTHROPLASTY  11/20/2011   Procedure: COMPUTER ASSISTED TOTAL KNEE ARTHROPLASTY;  Surgeon: Oneil JAYSON Barbarann, MD;  Location: [facility];  Service: Orthopedics;  Laterality: Left;  Conversion Left Knee Medial Uni to Total Knee Arthroplasty-Cemented   LUMBAR LAMINECTOMY/DECOMPRESSION MICRODISCECTOMY N/A 04/28/2015   Procedure: L2-3 Decompression;  Surgeon: Oneil JAYSON Barbarann, MD;  Location: [facility];  Service: Orthopedics;  Laterality: N/A;   MR MRA DUPLICATE EXAM  INACTIVATE     MULTIPLE TOOTH EXTRACTIONS     Neisen funduplication  01/31/1988   PERIPHERAL VASCULAR INTERVENTION  09/03/2019   Procedure: PERIPHERAL  VASCULAR INTERVENTION;  Surgeon: Darron Deatrice LABOR, MD;  Location: [facility];  Service: Cardiovascular;;  Iliac Stent   REPLACEMENT TOTAL KNEE Left 02/14/02, 05/04/02   left- partial   SCC EXCISION  04/25/2001   Dr. Gretel   STOMACH SURGERY  01/30/1982   PUD, [facility]   SUBMUCOSAL TATTOO INJECTION  07/04/2021   Procedure: SUBMUCOSAL TATTOO INJECTION;  Surgeon: Legrand Victory LITTIE DOUGLAS, MD;  Location: [facility];  Service: Gastroenterology;;   Social History   Occupational History   Occupation: ship broker, retired    Comment: firefighter  Tobacco Use   Smoking status: Former    Current packs/day: 0.00    Average packs/day: 1 pack/day for 41.0 years (41.0 ttl pk-yrs)    Types: Cigarettes    Start date: 42    Quit date: 2014    Years since quitting: 11.9   Smokeless tobacco: Never  Vaping Use   Vaping status: Never Used  Substance and Sexual Activity   Alcohol use: No    Alcohol/week: 0.0 standard drinks of alcohol   Drug use: Never   Sexual activity: Not Currently        "

## 2024-01-25 ENCOUNTER — Encounter: Payer: Self-pay | Admitting: "Endocrinology

## 2024-01-28 NOTE — Telephone Encounter (Signed)
 Flexeril  Last filled:  10/29/23 Last OV:  06/01/23, low back/hip pain Next OV:  none

## 2024-02-03 NOTE — Telephone Encounter (Signed)
 Is he still taking tizanidine ?  Is that working?  Please let me know.  Thanks.

## 2024-02-04 ENCOUNTER — Other Ambulatory Visit: Payer: Self-pay

## 2024-02-04 DIAGNOSIS — Z122 Encounter for screening for malignant neoplasm of respiratory organs: Secondary | ICD-10-CM

## 2024-02-04 DIAGNOSIS — Z87891 Personal history of nicotine dependence: Secondary | ICD-10-CM

## 2024-02-04 NOTE — Progress Notes (Signed)
 Anthony Cuevas                                          MRN: [id number]   02/04/2024   The [facility] Quality Team Specialist reviewed this patient medical record for the purposes of chart review for care gap closure. The following were reviewed: abstraction for care gap closure-glycemic status assessment.    [facility] Quality Team

## 2024-02-04 NOTE — Telephone Encounter (Signed)
 I sent the rx for flexeril .  I wouldn't take that and the tizanidine .  Please see about getting OV to see about options going forward to limit risk of polypharmacy.  Thanks.

## 2024-02-04 NOTE — Telephone Encounter (Signed)
 Called patient he is taking both. He states that he takes the flexeril  bid on average. He has been taking the Tizanidine  bid as well but not at the same time. He has some improvement with taking both but does not completely help symptoms. States he needs refill on Tizanidine  as well.

## 2024-02-07 ENCOUNTER — Other Ambulatory Visit: Payer: Self-pay | Admitting: Family Medicine

## 2024-02-07 ENCOUNTER — Ambulatory Visit: Payer: Medicare Other

## 2024-02-07 DIAGNOSIS — E11649 Type 2 diabetes mellitus with hypoglycemia without coma: Secondary | ICD-10-CM

## 2024-02-08 ENCOUNTER — Other Ambulatory Visit: Payer: Self-pay | Admitting: "Endocrinology

## 2024-02-11 ENCOUNTER — Ambulatory Visit

## 2024-02-11 VITALS — BP 115/61 | Ht 70.0 in | Wt 193.2 lb

## 2024-02-11 DIAGNOSIS — Z0000 Encounter for general adult medical examination without abnormal findings: Secondary | ICD-10-CM

## 2024-02-11 NOTE — Progress Notes (Signed)
 " I connected with  Anthony Cuevas on 02/11/2024 by a audio enabled telemedicine application and verified that I am speaking with the correct person using two identifiers.  Patient Location: Home  Provider Location: Home Office  Persons Participating in Visit: Patient.  I discussed the limitations of evaluation and management by telemedicine. The patient expressed understanding and agreed to proceed.  Vital Signs: Because this visit was a virtual/telehealth visit, some criteria may be missing or patient reported. Any vitals not documented were not able to be obtained and vitals that have been documented are patient reported.  Chief Complaint  Patient presents with   Medicare Wellness     Subjective:   [name] is a 74 y.o. male who presents for a Medicare Annual Wellness Visit.  Visit info / Clinical Intake: Medicare Wellness Visit Type:: Subsequent Annual Wellness Visit Persons participating in visit and providing information:: patient Medicare Wellness Visit Mode:: Telephone If telephone:: video declined Since this visit was completed virtually, some vitals may be partially provided or unavailable. Missing vitals are due to the limitations of the virtual format.: Documented vitals are patient reported If Telephone or Video please confirm:: I connected with patient using audio/video enable telemedicine. I verified patient identity with two identifiers, discussed telehealth limitations, and patient agreed to proceed. Patient Location:: home Provider Location:: home office Interpreter Needed?: No Pre-visit prep was completed: yes AWV questionnaire completed by patient prior to visit?: yes Date:: 02/09/24 Living arrangements:: (Patient-Rptd) lives with spouse/significant other Patient's Overall Health Status Rating: (Patient-Rptd) very good Typical amount of pain: (Patient-Rptd) some Does pain affect daily life?: (Patient-Rptd) no Are you currently prescribed opioids?:  no  Dietary Habits and Nutritional Risks How many meals a day?: (Patient-Rptd) 2 Eats fruit and vegetables daily?: (Patient-Rptd) yes Most meals are obtained by: (Patient-Rptd) preparing own meals; eating out In the last 2 weeks, have you had any of the following?: none Diabetic:: (!) yes (bs 220) Any non-healing wounds?: no How often do you check your BS?: continuous glucose monitor Would you like to be referred to a Nutritionist or for Diabetic Management? : no  Functional Status Activities of Daily Living (to include ambulation/medication): (Patient-Rptd) Independent Ambulation: (Patient-Rptd) Independent Medication Administration: (Patient-Rptd) Independent Home Management (perform basic housework or laundry): (Patient-Rptd) Independent Manage your own finances?: (Patient-Rptd) yes Primary transportation is: (Patient-Rptd) driving Concerns about vision?: no *vision screening is required for WTM* Concerns about hearing?: no  Fall Screening Falls in the past year?: (Patient-Rptd) 1 Number of falls in past year: (Patient-Rptd) 0 Was there an injury with Fall?: (Patient-Rptd) 0 Fall Risk Category Calculator: (Patient-Rptd) 1 Patient Fall Risk Level: (Patient-Rptd) Low Fall Risk  Fall Risk Patient at Risk for Falls Due to: History of fall(s); Impaired mobility; Impaired balance/gait Fall risk Follow up: Falls evaluation completed  Home and Transportation Safety: All rugs have non-skid backing?: (Patient-Rptd) yes All stairs or steps have railings?: (Patient-Rptd) yes Grab bars in the bathtub or shower?: (Patient-Rptd) yes Have non-skid surface in bathtub or shower?: (Patient-Rptd) yes Good home lighting?: (Patient-Rptd) yes Regular seat belt use?: (Patient-Rptd) yes Hospital stays in the last year:: (Patient-Rptd) no  Cognitive Assessment Difficulty concentrating, remembering, or making decisions? : (Patient-Rptd) yes Will 6CIT or Mini Cog be Completed: yes What year is  it?: 0 points What month is it?: 0 points Give patient an address phrase to remember (5 components): remember words apple, table, penny About what time is it?: 0 points Count backwards from 20 to 1: 0 points Say  the months of the year in reverse: 0 points Repeat the address phrase from earlier: 0 points 6 CIT Score: 0 points  Advance Directives (For Healthcare) Does Patient Have a Medical Advance Directive?: No Would patient like information on creating a medical advance directive?: No - Patient declined  Reviewed/Updated  Reviewed/Updated: Reviewed All (Medical, Surgical, Family, Medications, Allergies, Care Teams, Patient Goals)    Allergies (verified) Patient has no known allergies.   Current Medications (verified) Outpatient Encounter Medications as of 02/11/2024  Medication Sig   ascorbic acid  (VITAMIN C) 500 MG tablet Take 1 tablet (500 mg total) by mouth daily.   Cholecalciferol  (VITAMIN D3) 125 MCG (5000 UT) CAPS Take 5,000 Units by mouth 2 (two) times a week.   clopidogrel  (PLAVIX ) 75 MG tablet TAKE 1 TABLET BY MOUTH EVERY DAY   Continuous Glucose Receiver (FREESTYLE LIBRE 3 READER) DEVI Use to monitor glucose continuously   Continuous Glucose Sensor (FREESTYLE LIBRE 3 SENSOR) MISC Place 1 sensor on the skin every 14 days. Use to check glucose continuously   cyanocobalamin  1000 MCG tablet Take 1,000 mcg by mouth daily.   Dulaglutide  (TRULICITY ) 4.5 MG/0.5ML SOAJ Inject 4.5 mg as directed once a week.   EMBECTA PEN NEEDLE NANO 2 GEN 32G X 4 MM MISC USE 1 NEEDLE 4 TIMES A DAY   ezetimibe  (ZETIA ) 10 MG tablet Take 10 mg by mouth daily.   ferrous sulfate  325 (65 FE) MG tablet TAKE 1 TABLET ORALLY DAILY ON SUNDAY, WEDNESDAY (2 TABLETS PER WEEK)   Glucagon  (GVOKE HYPOPEN  1-PACK) 1 MG/0.2ML SOAJ Inject 1 mg into the skin as needed (low blood sugar with imaopired consciousness).   glucose blood (FREESTYLE LITE) test strip Use to check blood sugar 4 times a day   insulin  aspart  (NOVOLOG  FLEXPEN) 100 UNIT/ML FlexPen 25 units AM, 26 units lunch, 28 units at supper.   insulin  glargine, 2 Unit Dial, (TOUJEO  MAX SOLOSTAR) 300 UNIT/ML Solostar Pen Inject 66 Units into the skin daily.   latanoprost  (XALATAN ) 0.005 % ophthalmic solution Place 1 drop into both eyes at bedtime.   lisinopril  (ZESTRIL ) 40 MG tablet Take 1 tablet (40 mg total) by mouth daily.   Omega-3 Fatty Acids  (FISH OIL PO) Take 1,000 mg by mouth once a week.   rosuvastatin  (CRESTOR ) 20 MG tablet Take 1 tablet (20 mg total) by mouth daily.   SYNJARDY  XR 12.05-998 MG TB24 TAKE 2 TABLETS BY MOUTH EVERY DAY   tiZANidine  (ZANAFLEX ) 2 MG tablet TAKE 0.5-1 TABLETS (1-2 MG TOTAL) BY MOUTH EVERY 8 (EIGHT) HOURS AS NEEDED FOR MUSCLE SPASMS (USE INSTEAD OF FLEXERIL . SEDATION CAUTION.).   traMADol  (ULTRAM ) 50 MG tablet TAKE 1 TABLET TWO TO THREE TIMES A DAY AS NEEDED FOR MODERATE PAIN   cyclobenzaprine  (FLEXERIL ) 10 MG tablet TAKE 1 TABLET BY MOUTH TWICE A DAY AS NEEDED FOR MUSCLE SPASMS (Patient not taking: Reported on 02/11/2024)   No facility-administered encounter medications on file as of 02/11/2024.    History: Past Medical History:  Diagnosis Date   Arthritis    Coronary atherosclerosis    a. 07/2018 CT Chest: 3 vessel coronary atherosclerosis; b. 07/2018 MV: EF 46% (GI uptake artifact), No ischemia/infarct. Mild to mod diffuse Ao atherosclerosis and at least moderate 2 vessel CAD (LAD/RCA) on CT imaging. Low risk.   Depression    improved on sertraline    Diabetes mellitus, type 2 (HCC) 09/1996   GERD (gastroesophageal reflux disease) 1990   History of peptic ulcer disease    Hyperlipidemia  1992   Hypertension 05/2003   Lumbar stenosis L2-3   Muscle atrophy    in arms from degenerative changes in neck   Neck pain    chronic   Smoker    Wears glasses    Past Surgical History:  Procedure Laterality Date   ABDOMINAL AORTOGRAM W/LOWER EXTREMITY N/A 09/03/2019   Procedure: ABDOMINAL AORTOGRAM W/LOWER  EXTREMITY;  Surgeon: Darron Deatrice LABOR, MD;  Location: [facility];  Service: Cardiovascular;  Laterality: N/A;  Limited Study   CERVICAL DISC SURGERY  04/19/2002   fusion, Dr. Barbarann   CERVICAL DISCECTOMY  01/31/1995   partial   CERVICAL DISCECTOMY  01/30/1998   COLONOSCOPY WITH ESOPHAGOGASTRODUODENOSCOPY (EGD)  06/25/2015   ENTEROSCOPY N/A 07/04/2021   Procedure: ENTEROSCOPY;  Surgeon: Legrand Victory LITTIE DOUGLAS, MD;  Location: [facility];  Service: Gastroenterology;  Laterality: N/A;   HEMORRHOID SURGERY  01/31/1987   HOT HEMOSTASIS N/A 07/04/2021   Procedure: HOT HEMOSTASIS (ARGON PLASMA COAGULATION/BICAP);  Surgeon: Legrand Victory LITTIE DOUGLAS, MD;  Location: [facility];  Service: Gastroenterology;  Laterality: N/A;   JOINT REPLACEMENT     KNEE ARTHROPLASTY  11/20/2011   Procedure: COMPUTER ASSISTED TOTAL KNEE ARTHROPLASTY;  Surgeon: Oneil JAYSON Barbarann, MD;  Location: [facility];  Service: Orthopedics;  Laterality: Left;  Conversion Left Knee Medial Uni to Total Knee Arthroplasty-Cemented   LUMBAR LAMINECTOMY/DECOMPRESSION MICRODISCECTOMY N/A 04/28/2015   Procedure: L2-3 Decompression;  Surgeon: Oneil JAYSON Barbarann, MD;  Location: [facility];  Service: Orthopedics;  Laterality: N/A;   MR MRA DUPLICATE EXAM  INACTIVATE     MULTIPLE TOOTH EXTRACTIONS     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 Gretel   SPINE SURGERY     STOMACH SURGERY  01/30/1982   [facility], [facility]   SUBMUCOSAL TATTOO INJECTION  07/04/2021   Procedure: SUBMUCOSAL TATTOO INJECTION;  Surgeon: Legrand Victory LITTIE DOUGLAS, MD;  Location: [facility];  Service: Gastroenterology;;   Family History  Problem Relation Age of Onset   Diabetes Mother    Hypertension Mother    Stroke  Mother    Cancer Mother    Arthritis Mother    Diabetes Father    Alcohol abuse Father    Diabetes Sister    Diabetes Brother    Diabetes Brother    Heart disease Brother    Breast cancer Maternal Grandmother    Lung disease Paternal Grandfather        black lung   Heart disease Son    Prostate cancer Neg Hx    Colon cancer Neg Hx    Stomach cancer Neg Hx    Social History   Occupational History   Occupation: ship broker, retired    Comment: firefighter  Tobacco Use   Smoking status: Former    Current packs/day: 0.00    Average packs/day: 1 pack/day for 41.0 years (41.0 ttl pk-yrs)    Types: Cigarettes    Start date: 60    Quit date: 2014    Years since quitting: 12.0   Smokeless tobacco: Never  Vaping Use   Vaping status: Never Used  Substance and Sexual Activity   Alcohol use: Not Currently   Drug use: Never   Sexual  activity: Not Currently    Birth control/protection: None   Tobacco Counseling Counseling given: Not Answered  SDOH Screenings   Food Insecurity: No Food Insecurity (02/11/2024)  Housing: Low Risk (02/11/2024)  Transportation Needs: No Transportation Needs (02/11/2024)  Utilities: Not At Risk (02/11/2024)  Alcohol Screen: Low Risk (02/11/2023)  Depression (PHQ2-9): Low Risk (02/11/2024)  Financial Resource Strain: Low Risk (02/11/2023)  Physical Activity: Sufficiently Active (02/11/2024)  Social Connections: Moderately Isolated (02/11/2024)  Stress: No Stress Concern Present (02/11/2024)  Tobacco Use: Medium Risk (02/11/2024)  Health Literacy: Adequate Health Literacy (02/11/2024)   See flowsheets for full screening details  Depression Screen Depression Screening Exception Documentation Depression Screening Exception:: Patient refusal  PHQ 2 & 9 Depression Scale- Over the past 2 weeks, how often have you been bothered by any of the following problems? Little interest or pleasure in doing things: 0 Feeling down, depressed, or hopeless (PHQ  Adolescent also includes...irritable): 0 PHQ-2 Total Score: 0 Trouble falling or staying asleep, or sleeping too much: 0 Feeling tired or having little energy: 0 Poor appetite or overeating (PHQ Adolescent also includes...weight loss): 0 Feeling bad about yourself - or that you are a failure or have let yourself or your family down: 0 Trouble concentrating on things, such as reading the newspaper or watching television (PHQ Adolescent also includes...like school work): 0 Moving or speaking so slowly that other people could have noticed. Or the opposite - being so fidgety or restless that you have been moving around a lot more than usual: 0 Thoughts that you would be better off dead, or of hurting yourself in some way: 0 PHQ-9 Total Score: 0 If you checked off any problems, how difficult have these problems made it for you to do your work, take care of things at home, or get along with other people?: Not difficult at all  Depression Treatment Depression Interventions/Treatment : EYV7-0 Score <4 Follow-up Not Indicated     Goals Addressed               This Visit's Progress     Patient Stated (pt-stated)        Patient would like to stay out the hospital              Objective:    Today's Vitals   02/11/24 1109  BP: 115/61  Weight: 193 lb 3.2 oz (87.6 kg)  Height: 5' 10 (1.778 m)   Body mass index is 27.72 kg/m.  Hearing/Vision screen Hearing Screening - Comments:: No difficulties Vision Screening - Comments:: Patient has no difficulties  Immunizations and Health Maintenance Health Maintenance  Topic Date Due   Influenza Vaccine  08/31/2023   COVID-19 Vaccine (7 - 2025-26 season) 10/01/2023   Medicare Annual Wellness (AWV)  02/06/2024   HEMOGLOBIN A1C  06/22/2024   Diabetic kidney evaluation - eGFR measurement  09/27/2024   Diabetic kidney evaluation - Urine ACR  09/27/2024   OPHTHALMOLOGY EXAM  10/04/2024   FOOT EXAM  12/23/2024   Lung Cancer Screening   01/22/2025   Colonoscopy  05/18/2026   DTaP/Tdap/Td (4 - Td or Tdap) 08/14/2030   Pneumococcal Vaccine: 50+ Years  Completed   Hepatitis C Screening  Completed   Zoster Vaccines- Shingrix  Completed   Meningococcal B Vaccine  Aged Out        Assessment/Plan:  This is a routine wellness examination for Lamar.  Patient Care Team: Cleatus Arlyss RAMAN, MD as PCP - General (Family Medicine) Perla Evalene PARAS, MD as PCP -  Cardiology (Cardiology) Barbarann Oneil BROCKS, MD (Inactive) as Consulting Physician (Orthopedic Surgery) Zehr, Harlene BIRCH, PA-C as Physician Assistant (Gastroenterology) Loreda Hacker, DPM as Consulting Physician (Podiatry) Portia Fireman, [place] as Referring Physician (Optometry) Kassie Mallick, MD (Inactive) as Consulting Physician (Endocrinology)  I have personally reviewed and noted the following in the patients chart:   Medical and social history Use of alcohol, tobacco or illicit drugs  Current medications and supplements including opioid prescriptions. Functional ability and status Nutritional status Physical activity Advanced directives List of other physicians Hospitalizations, surgeries, and ER visits in previous 12 months Vitals Screenings to include cognitive, depression, and falls Referrals and appointments  No orders of the defined types were placed in this encounter.  In addition, I have reviewed and discussed with patient certain preventive protocols, quality metrics, and best practice recommendations. A written personalized care plan for preventive services as well as general preventive health recommendations were provided to patient.   Lyle MARLA Right, [place]   02/11/2024   No follow-ups on file.  After Visit Summary: (MyChart) Due to this being a telephonic visit, the after visit summary with patients personalized plan was offered to patient via MyChart   No voiced or noted concerns at this time Vaccines not given: flu and covid  declined today  "

## 2024-02-11 NOTE — Patient Instructions (Signed)
 Anthony Cuevas,  Thank you for taking the time for your Medicare Wellness Visit. I appreciate your continued commitment to your health goals. Please review the care plan we discussed, and feel free to reach out if I can assist you further.  Please note that Annual Wellness Visits do not include a physical exam. Some assessments may be limited, especially if the visit was conducted virtually. If needed, we may recommend an in-person follow-up with your provider.  Ongoing Care Seeing your primary care provider every 3 to 6 months helps us  monitor your health and provide consistent, personalized care.   Referrals If a referral was made during today's visit and you haven't received any updates within two weeks, please contact the referred provider directly to check on the status.  Recommended Screenings:  Health Maintenance  Topic Date Due   Flu Shot  08/31/2023   COVID-19 Vaccine (7 - 2025-26 season) 10/01/2023   Medicare Annual Wellness Visit  02/06/2024   Hemoglobin A1C  06/22/2024   Yearly kidney function blood test for diabetes  09/27/2024   Yearly kidney health urinalysis for diabetes  09/27/2024   Eye exam for diabetics  10/04/2024   Complete foot exam   12/23/2024   Screening for Lung Cancer  01/22/2025   Colon Cancer Screening  05/18/2026   DTaP/Tdap/Td vaccine (4 - Td or Tdap) 08/14/2030   Pneumococcal Vaccine for age over 35  Completed   Hepatitis C Screening  Completed   Zoster (Shingles) Vaccine  Completed   Meningitis B Vaccine  Aged Out       02/09/2024    1:47 PM  Advanced Directives  Does Patient Have a Medical Advance Directive? No  Would patient like information on creating a medical advance directive? No - Patient declined    Vision: Annual vision screenings are recommended for early detection of glaucoma, cataracts, and diabetic retinopathy. These exams can also reveal signs of chronic conditions such as diabetes and high blood pressure.  Dental: Annual dental  screenings help detect early signs of oral cancer, gum disease, and other conditions linked to overall health, including heart disease and diabetes.  Please see the attached documents for additional preventive care recommendations.

## 2024-02-20 ENCOUNTER — Other Ambulatory Visit: Payer: Self-pay

## 2024-02-20 DIAGNOSIS — E11649 Type 2 diabetes mellitus with hypoglycemia without coma: Secondary | ICD-10-CM

## 2024-02-21 MED ORDER — TOUJEO MAX SOLOSTAR 300 UNIT/ML ~~LOC~~ SOPN: 56.0000 [IU] | PEN_INJECTOR | Freq: Every day | SUBCUTANEOUS | 2 refills | Status: AC

## 2024-02-21 MED ORDER — NOVOLOG FLEXPEN 100 UNIT/ML ~~LOC~~ SOPN: PEN_INJECTOR | SUBCUTANEOUS | 5 refills | Status: DC

## 2024-02-21 MED ORDER — SYNJARDY XR 12.5-1000 MG PO TB24: 2.0000 | ORAL_TABLET | Freq: Every day | ORAL | 1 refills | Status: AC

## 2024-02-22 ENCOUNTER — Other Ambulatory Visit: Payer: Self-pay

## 2024-02-22 DIAGNOSIS — E11649 Type 2 diabetes mellitus with hypoglycemia without coma: Secondary | ICD-10-CM

## 2024-02-22 MED ORDER — TRULICITY 4.5 MG/0.5ML ~~LOC~~ SOAJ: 4.5000 mg | SUBCUTANEOUS | 1 refills | Status: AC

## 2024-02-27 ENCOUNTER — Other Ambulatory Visit: Payer: Self-pay

## 2024-02-27 DIAGNOSIS — E11649 Type 2 diabetes mellitus with hypoglycemia without coma: Secondary | ICD-10-CM

## 2024-02-27 MED ORDER — NOVOLOG FLEXPEN 100 UNIT/ML ~~LOC~~ SOPN: PEN_INJECTOR | SUBCUTANEOUS | 5 refills | Status: AC

## 2024-03-24 ENCOUNTER — Ambulatory Visit: Admitting: "Endocrinology

## 2024-03-24 ENCOUNTER — Ambulatory Visit: Admitting: Podiatry
